# Patient Record
Sex: Female | Born: 1935 | Race: White | Hispanic: No | Smoking: Never smoker
Health system: Southern US, Community
[De-identification: ages and names within clinical notes are randomized; demographics above are authoritative.]

## PROBLEM LIST (undated history)

## (undated) DIAGNOSIS — L03116 Cellulitis of left lower limb: Secondary | ICD-10-CM

## (undated) DIAGNOSIS — E440 Moderate protein-calorie malnutrition: Secondary | ICD-10-CM

## (undated) DIAGNOSIS — R197 Diarrhea, unspecified: Secondary | ICD-10-CM

## (undated) DIAGNOSIS — C801 Malignant (primary) neoplasm, unspecified: Secondary | ICD-10-CM

## (undated) DIAGNOSIS — H3530 Unspecified macular degeneration: Secondary | ICD-10-CM

## (undated) DIAGNOSIS — I5033 Acute on chronic diastolic (congestive) heart failure: Secondary | ICD-10-CM

## (undated) DIAGNOSIS — I5030 Unspecified diastolic (congestive) heart failure: Secondary | ICD-10-CM

## (undated) DIAGNOSIS — I10 Essential (primary) hypertension: Secondary | ICD-10-CM

## (undated) DIAGNOSIS — R600 Localized edema: Secondary | ICD-10-CM

## (undated) DIAGNOSIS — D649 Anemia, unspecified: Secondary | ICD-10-CM

## (undated) DIAGNOSIS — E871 Hypo-osmolality and hyponatremia: Secondary | ICD-10-CM

## (undated) DIAGNOSIS — E785 Hyperlipidemia, unspecified: Secondary | ICD-10-CM

## (undated) DIAGNOSIS — I4821 Permanent atrial fibrillation: Secondary | ICD-10-CM

## (undated) DIAGNOSIS — R32 Unspecified urinary incontinence: Secondary | ICD-10-CM

## (undated) DIAGNOSIS — L0390 Cellulitis, unspecified: Secondary | ICD-10-CM

## (undated) DIAGNOSIS — L03115 Cellulitis of right lower limb: Secondary | ICD-10-CM

## (undated) HISTORY — PX: BREAST SURGERY: SHX581

## (undated) HISTORY — PX: EYE SURGERY: SHX253

## (undated) HISTORY — PX: MASTECTOMY: SHX3

## (undated) HISTORY — DX: Acute on chronic diastolic (congestive) heart failure: I50.33

## (undated) HISTORY — PX: SKIN BIOPSY: SHX1

## (undated) HISTORY — DX: Moderate protein-calorie malnutrition: E44.0

## (undated) HISTORY — DX: Hyperlipidemia, unspecified: E78.5

## (undated) HISTORY — DX: Unspecified macular degeneration: H35.30

## (undated) HISTORY — DX: Cellulitis of left lower limb: L03.116

## (undated) HISTORY — DX: Cellulitis of right lower limb: L03.115

## (undated) HISTORY — DX: Unspecified diastolic (congestive) heart failure: I50.30

## (undated) HISTORY — DX: Anemia, unspecified: D64.9

## (undated) HISTORY — PX: SHOULDER OPEN ROTATOR CUFF REPAIR: SHX2407

## (undated) HISTORY — DX: Diarrhea, unspecified: R19.7

---

## 2013-10-28 DIAGNOSIS — S0990XA Unspecified injury of head, initial encounter: Secondary | ICD-10-CM | POA: Insufficient documentation

## 2013-10-28 DIAGNOSIS — S61419A Laceration without foreign body of unspecified hand, initial encounter: Secondary | ICD-10-CM

## 2013-10-28 HISTORY — DX: Laceration without foreign body of unspecified hand, initial encounter: S61.419A

## 2013-10-28 HISTORY — DX: Unspecified injury of head, initial encounter: S09.90XA

## 2014-05-11 DIAGNOSIS — S62109A Fracture of unspecified carpal bone, unspecified wrist, initial encounter for closed fracture: Secondary | ICD-10-CM | POA: Insufficient documentation

## 2014-05-13 DIAGNOSIS — Z85828 Personal history of other malignant neoplasm of skin: Secondary | ICD-10-CM | POA: Insufficient documentation

## 2014-09-29 DIAGNOSIS — L03119 Cellulitis of unspecified part of limb: Secondary | ICD-10-CM | POA: Insufficient documentation

## 2014-10-08 DIAGNOSIS — H409 Unspecified glaucoma: Secondary | ICD-10-CM | POA: Insufficient documentation

## 2016-06-05 DIAGNOSIS — H353211 Exudative age-related macular degeneration, right eye, with active choroidal neovascularization: Secondary | ICD-10-CM | POA: Diagnosis not present

## 2016-06-05 DIAGNOSIS — H353122 Nonexudative age-related macular degeneration, left eye, intermediate dry stage: Secondary | ICD-10-CM | POA: Diagnosis not present

## 2016-06-13 DIAGNOSIS — M5441 Lumbago with sciatica, right side: Secondary | ICD-10-CM | POA: Diagnosis not present

## 2016-06-13 DIAGNOSIS — M9903 Segmental and somatic dysfunction of lumbar region: Secondary | ICD-10-CM | POA: Diagnosis not present

## 2016-06-13 DIAGNOSIS — M5136 Other intervertebral disc degeneration, lumbar region: Secondary | ICD-10-CM | POA: Diagnosis not present

## 2016-06-13 DIAGNOSIS — M9904 Segmental and somatic dysfunction of sacral region: Secondary | ICD-10-CM | POA: Diagnosis not present

## 2016-06-16 DIAGNOSIS — M5136 Other intervertebral disc degeneration, lumbar region: Secondary | ICD-10-CM | POA: Diagnosis not present

## 2016-06-16 DIAGNOSIS — M9904 Segmental and somatic dysfunction of sacral region: Secondary | ICD-10-CM | POA: Diagnosis not present

## 2016-06-16 DIAGNOSIS — M5441 Lumbago with sciatica, right side: Secondary | ICD-10-CM | POA: Diagnosis not present

## 2016-06-16 DIAGNOSIS — M9903 Segmental and somatic dysfunction of lumbar region: Secondary | ICD-10-CM | POA: Diagnosis not present

## 2016-06-22 DIAGNOSIS — M5441 Lumbago with sciatica, right side: Secondary | ICD-10-CM | POA: Diagnosis not present

## 2016-06-22 DIAGNOSIS — S22010A Wedge compression fracture of first thoracic vertebra, initial encounter for closed fracture: Secondary | ICD-10-CM | POA: Diagnosis not present

## 2016-06-22 DIAGNOSIS — M545 Low back pain: Secondary | ICD-10-CM | POA: Diagnosis not present

## 2016-06-30 DIAGNOSIS — M25551 Pain in right hip: Secondary | ICD-10-CM | POA: Diagnosis not present

## 2016-06-30 DIAGNOSIS — M7061 Trochanteric bursitis, right hip: Secondary | ICD-10-CM | POA: Diagnosis not present

## 2016-06-30 DIAGNOSIS — M47816 Spondylosis without myelopathy or radiculopathy, lumbar region: Secondary | ICD-10-CM | POA: Diagnosis not present

## 2016-07-04 DIAGNOSIS — F40231 Fear of injections and transfusions: Secondary | ICD-10-CM | POA: Diagnosis not present

## 2016-07-04 DIAGNOSIS — M25551 Pain in right hip: Secondary | ICD-10-CM | POA: Diagnosis not present

## 2016-07-04 DIAGNOSIS — I481 Persistent atrial fibrillation: Secondary | ICD-10-CM | POA: Diagnosis not present

## 2016-07-04 DIAGNOSIS — I10 Essential (primary) hypertension: Secondary | ICD-10-CM | POA: Diagnosis not present

## 2016-07-04 DIAGNOSIS — M1611 Unilateral primary osteoarthritis, right hip: Secondary | ICD-10-CM | POA: Diagnosis not present

## 2016-07-06 DIAGNOSIS — M4856XA Collapsed vertebra, not elsewhere classified, lumbar region, initial encounter for fracture: Secondary | ICD-10-CM | POA: Diagnosis not present

## 2016-07-12 DIAGNOSIS — R29898 Other symptoms and signs involving the musculoskeletal system: Secondary | ICD-10-CM | POA: Diagnosis not present

## 2016-07-12 DIAGNOSIS — S32030G Wedge compression fracture of third lumbar vertebra, subsequent encounter for fracture with delayed healing: Secondary | ICD-10-CM | POA: Diagnosis not present

## 2016-07-12 DIAGNOSIS — M47816 Spondylosis without myelopathy or radiculopathy, lumbar region: Secondary | ICD-10-CM | POA: Diagnosis not present

## 2016-07-12 DIAGNOSIS — M7061 Trochanteric bursitis, right hip: Secondary | ICD-10-CM | POA: Diagnosis not present

## 2016-07-12 DIAGNOSIS — M48062 Spinal stenosis, lumbar region with neurogenic claudication: Secondary | ICD-10-CM | POA: Diagnosis not present

## 2016-07-12 DIAGNOSIS — M25551 Pain in right hip: Secondary | ICD-10-CM | POA: Diagnosis not present

## 2016-07-25 DIAGNOSIS — F40231 Fear of injections and transfusions: Secondary | ICD-10-CM | POA: Diagnosis not present

## 2016-07-25 DIAGNOSIS — I481 Persistent atrial fibrillation: Secondary | ICD-10-CM | POA: Diagnosis not present

## 2016-07-25 DIAGNOSIS — I10 Essential (primary) hypertension: Secondary | ICD-10-CM | POA: Diagnosis not present

## 2016-07-25 DIAGNOSIS — M5117 Intervertebral disc disorders with radiculopathy, lumbosacral region: Secondary | ICD-10-CM | POA: Diagnosis not present

## 2016-07-25 DIAGNOSIS — M5416 Radiculopathy, lumbar region: Secondary | ICD-10-CM | POA: Diagnosis not present

## 2016-08-08 DIAGNOSIS — I481 Persistent atrial fibrillation: Secondary | ICD-10-CM | POA: Diagnosis not present

## 2016-08-08 DIAGNOSIS — M5416 Radiculopathy, lumbar region: Secondary | ICD-10-CM | POA: Diagnosis not present

## 2016-08-08 DIAGNOSIS — I10 Essential (primary) hypertension: Secondary | ICD-10-CM | POA: Diagnosis not present

## 2016-08-08 DIAGNOSIS — F40231 Fear of injections and transfusions: Secondary | ICD-10-CM | POA: Diagnosis not present

## 2016-08-17 DIAGNOSIS — S32030G Wedge compression fracture of third lumbar vertebra, subsequent encounter for fracture with delayed healing: Secondary | ICD-10-CM | POA: Diagnosis not present

## 2016-08-17 DIAGNOSIS — M5417 Radiculopathy, lumbosacral region: Secondary | ICD-10-CM | POA: Diagnosis not present

## 2016-08-17 DIAGNOSIS — M5416 Radiculopathy, lumbar region: Secondary | ICD-10-CM | POA: Diagnosis not present

## 2016-08-17 DIAGNOSIS — M25551 Pain in right hip: Secondary | ICD-10-CM | POA: Diagnosis not present

## 2016-08-17 DIAGNOSIS — M47816 Spondylosis without myelopathy or radiculopathy, lumbar region: Secondary | ICD-10-CM | POA: Diagnosis not present

## 2016-08-17 DIAGNOSIS — M48062 Spinal stenosis, lumbar region with neurogenic claudication: Secondary | ICD-10-CM | POA: Diagnosis not present

## 2016-08-17 DIAGNOSIS — M7061 Trochanteric bursitis, right hip: Secondary | ICD-10-CM | POA: Diagnosis not present

## 2016-08-17 DIAGNOSIS — R29898 Other symptoms and signs involving the musculoskeletal system: Secondary | ICD-10-CM | POA: Diagnosis not present

## 2016-08-17 DIAGNOSIS — M1611 Unilateral primary osteoarthritis, right hip: Secondary | ICD-10-CM | POA: Diagnosis not present

## 2016-09-05 DIAGNOSIS — I482 Chronic atrial fibrillation: Secondary | ICD-10-CM | POA: Diagnosis not present

## 2016-09-05 DIAGNOSIS — M816 Localized osteoporosis [Lequesne]: Secondary | ICD-10-CM | POA: Diagnosis not present

## 2016-09-05 DIAGNOSIS — R600 Localized edema: Secondary | ICD-10-CM | POA: Diagnosis not present

## 2016-09-05 DIAGNOSIS — I10 Essential (primary) hypertension: Secondary | ICD-10-CM | POA: Diagnosis not present

## 2016-09-11 DIAGNOSIS — H353122 Nonexudative age-related macular degeneration, left eye, intermediate dry stage: Secondary | ICD-10-CM | POA: Diagnosis not present

## 2016-09-11 DIAGNOSIS — H353211 Exudative age-related macular degeneration, right eye, with active choroidal neovascularization: Secondary | ICD-10-CM | POA: Diagnosis not present

## 2016-10-03 DIAGNOSIS — R350 Frequency of micturition: Secondary | ICD-10-CM | POA: Diagnosis not present

## 2016-10-03 DIAGNOSIS — R609 Edema, unspecified: Secondary | ICD-10-CM | POA: Diagnosis not present

## 2016-10-03 DIAGNOSIS — I10 Essential (primary) hypertension: Secondary | ICD-10-CM | POA: Diagnosis not present

## 2016-11-14 DIAGNOSIS — R609 Edema, unspecified: Secondary | ICD-10-CM | POA: Insufficient documentation

## 2016-11-22 DIAGNOSIS — I10 Essential (primary) hypertension: Secondary | ICD-10-CM | POA: Diagnosis not present

## 2016-11-22 DIAGNOSIS — E785 Hyperlipidemia, unspecified: Secondary | ICD-10-CM | POA: Diagnosis not present

## 2016-11-22 DIAGNOSIS — I482 Chronic atrial fibrillation: Secondary | ICD-10-CM | POA: Diagnosis not present

## 2016-11-22 DIAGNOSIS — E7800 Pure hypercholesterolemia, unspecified: Secondary | ICD-10-CM | POA: Diagnosis not present

## 2016-11-27 DIAGNOSIS — Z0101 Encounter for examination of eyes and vision with abnormal findings: Secondary | ICD-10-CM | POA: Diagnosis not present

## 2016-11-27 DIAGNOSIS — H353212 Exudative age-related macular degeneration, right eye, with inactive choroidal neovascularization: Secondary | ICD-10-CM | POA: Diagnosis not present

## 2016-11-27 DIAGNOSIS — H01024 Squamous blepharitis left upper eyelid: Secondary | ICD-10-CM | POA: Diagnosis not present

## 2016-11-27 DIAGNOSIS — H401132 Primary open-angle glaucoma, bilateral, moderate stage: Secondary | ICD-10-CM | POA: Diagnosis not present

## 2016-11-27 DIAGNOSIS — H35371 Puckering of macula, right eye: Secondary | ICD-10-CM | POA: Diagnosis not present

## 2016-11-27 DIAGNOSIS — H01021 Squamous blepharitis right upper eyelid: Secondary | ICD-10-CM | POA: Diagnosis not present

## 2016-12-22 DIAGNOSIS — I482 Chronic atrial fibrillation: Secondary | ICD-10-CM | POA: Diagnosis not present

## 2016-12-22 DIAGNOSIS — Z0000 Encounter for general adult medical examination without abnormal findings: Secondary | ICD-10-CM | POA: Diagnosis not present

## 2016-12-22 DIAGNOSIS — I10 Essential (primary) hypertension: Secondary | ICD-10-CM | POA: Diagnosis not present

## 2016-12-22 DIAGNOSIS — R609 Edema, unspecified: Secondary | ICD-10-CM | POA: Diagnosis not present

## 2016-12-22 DIAGNOSIS — E785 Hyperlipidemia, unspecified: Secondary | ICD-10-CM | POA: Diagnosis not present

## 2017-01-08 DIAGNOSIS — H353122 Nonexudative age-related macular degeneration, left eye, intermediate dry stage: Secondary | ICD-10-CM | POA: Diagnosis not present

## 2017-01-08 DIAGNOSIS — H353211 Exudative age-related macular degeneration, right eye, with active choroidal neovascularization: Secondary | ICD-10-CM | POA: Diagnosis not present

## 2017-01-12 DIAGNOSIS — Z23 Encounter for immunization: Secondary | ICD-10-CM | POA: Diagnosis not present

## 2017-02-01 DIAGNOSIS — Z23 Encounter for immunization: Secondary | ICD-10-CM | POA: Diagnosis not present

## 2017-04-16 ENCOUNTER — Emergency Department (HOSPITAL_COMMUNITY): Payer: Medicare Other

## 2017-04-16 ENCOUNTER — Emergency Department (HOSPITAL_COMMUNITY)
Admission: EM | Admit: 2017-04-16 | Discharge: 2017-04-16 | Disposition: A | Discharge status: Home / Self Care | Payer: Medicare Other | Attending: Emergency Medicine | Admitting: Emergency Medicine

## 2017-04-16 ENCOUNTER — Other Ambulatory Visit: Payer: Self-pay

## 2017-04-16 ENCOUNTER — Encounter (HOSPITAL_COMMUNITY): Payer: Self-pay | Admitting: Nurse Practitioner

## 2017-04-16 DIAGNOSIS — E871 Hypo-osmolality and hyponatremia: Secondary | ICD-10-CM | POA: Insufficient documentation

## 2017-04-16 DIAGNOSIS — Z79899 Other long term (current) drug therapy: Secondary | ICD-10-CM | POA: Diagnosis not present

## 2017-04-16 DIAGNOSIS — I10 Essential (primary) hypertension: Secondary | ICD-10-CM | POA: Diagnosis not present

## 2017-04-16 DIAGNOSIS — Y929 Unspecified place or not applicable: Secondary | ICD-10-CM | POA: Insufficient documentation

## 2017-04-16 DIAGNOSIS — Z853 Personal history of malignant neoplasm of breast: Secondary | ICD-10-CM | POA: Insufficient documentation

## 2017-04-16 DIAGNOSIS — S29002A Unspecified injury of muscle and tendon of back wall of thorax, initial encounter: Secondary | ICD-10-CM | POA: Diagnosis not present

## 2017-04-16 DIAGNOSIS — X509XXA Other and unspecified overexertion or strenuous movements or postures, initial encounter: Secondary | ICD-10-CM | POA: Insufficient documentation

## 2017-04-16 DIAGNOSIS — Z7901 Long term (current) use of anticoagulants: Secondary | ICD-10-CM | POA: Diagnosis not present

## 2017-04-16 DIAGNOSIS — G8911 Acute pain due to trauma: Secondary | ICD-10-CM | POA: Diagnosis not present

## 2017-04-16 DIAGNOSIS — Y999 Unspecified external cause status: Secondary | ICD-10-CM | POA: Diagnosis not present

## 2017-04-16 DIAGNOSIS — Y9301 Activity, walking, marching and hiking: Secondary | ICD-10-CM | POA: Diagnosis not present

## 2017-04-16 DIAGNOSIS — S32030A Wedge compression fracture of third lumbar vertebra, initial encounter for closed fracture: Secondary | ICD-10-CM | POA: Diagnosis not present

## 2017-04-16 DIAGNOSIS — M545 Low back pain: Secondary | ICD-10-CM | POA: Diagnosis not present

## 2017-04-16 DIAGNOSIS — M549 Dorsalgia, unspecified: Secondary | ICD-10-CM | POA: Diagnosis not present

## 2017-04-16 DIAGNOSIS — S3992XA Unspecified injury of lower back, initial encounter: Secondary | ICD-10-CM | POA: Diagnosis present

## 2017-04-16 DIAGNOSIS — R260 Ataxic gait: Secondary | ICD-10-CM | POA: Diagnosis not present

## 2017-04-16 HISTORY — DX: Malignant (primary) neoplasm, unspecified: C80.1

## 2017-04-16 HISTORY — DX: Cellulitis, unspecified: L03.90

## 2017-04-16 HISTORY — DX: Hypo-osmolality and hyponatremia: E87.1

## 2017-04-16 HISTORY — DX: Localized edema: R60.0

## 2017-04-16 HISTORY — DX: Unspecified urinary incontinence: R32

## 2017-04-16 HISTORY — DX: Essential (primary) hypertension: I10

## 2017-04-16 LAB — CBC
HEMATOCRIT: 34.4 % — AB (ref 36.0–46.0)
HEMOGLOBIN: 11.9 g/dL — AB (ref 12.0–15.0)
MCH: 27.9 pg (ref 26.0–34.0)
MCHC: 34.6 g/dL (ref 30.0–36.0)
MCV: 80.6 fL (ref 78.0–100.0)
Platelets: 405 10*3/uL — ABNORMAL HIGH (ref 150–400)
RBC: 4.27 MIL/uL (ref 3.87–5.11)
RDW: 14.2 % (ref 11.5–15.5)
WBC: 16.3 10*3/uL — AB (ref 4.0–10.5)

## 2017-04-16 LAB — BASIC METABOLIC PANEL
Anion gap: 10 (ref 5–15)
BUN: 13 mg/dL (ref 6–20)
CALCIUM: 9.3 mg/dL (ref 8.9–10.3)
CHLORIDE: 88 mmol/L — AB (ref 101–111)
CO2: 26 mmol/L (ref 22–32)
Creatinine, Ser: 0.61 mg/dL (ref 0.44–1.00)
GFR calc non Af Amer: >60 mL/min (ref >60)
Glucose, Bld: 118 mg/dL — ABNORMAL HIGH (ref 65–99)
Potassium: 3.5 mmol/L (ref 3.5–5.1)
Sodium: 124 mmol/L — ABNORMAL LOW (ref 135–145)

## 2017-04-16 LAB — URINALYSIS, ROUTINE W REFLEX MICROSCOPIC
Bilirubin Urine: NEGATIVE
Glucose, UA: NEGATIVE mg/dL
Hgb urine dipstick: NEGATIVE
KETONES UR: NEGATIVE mg/dL
LEUKOCYTES UA: NEGATIVE
NITRITE: NEGATIVE
PH: 7 (ref 5.0–8.0)
Protein, ur: NEGATIVE mg/dL
Specific Gravity, Urine: 1.01 (ref 1.005–1.030)

## 2017-04-16 MED ORDER — HYDROCODONE-ACETAMINOPHEN 5-325 MG PO TABS: 1.0000 | ORAL_TABLET | Freq: Four times a day (QID) | ORAL | 0 refills | Status: DC | PRN

## 2017-04-16 MED ORDER — MORPHINE SULFATE (PF) 4 MG/ML IV SOLN
4.0000 mg | Freq: Once | INTRAVENOUS | Status: AC
  Administered 2017-04-16: 4 mg via INTRAVENOUS
  Filled 2017-04-16: qty 1

## 2017-04-16 MED ORDER — DOCUSATE SODIUM 100 MG PO CAPS: 100.0000 mg | ORAL_CAPSULE | Freq: Every day | ORAL | 0 refills | Status: DC

## 2017-04-16 NOTE — ED Notes (Signed)
[employer] called they are going to deliver the brace within 2 hours

## 2017-04-16 NOTE — ED Provider Notes (Signed)
[facility] DEPT Provider Note   CSN: [id number] Arrival date & time: 04/16/17  1319     History   Chief Complaint Back pain  HPI Heather Larson is a 83 y.o. female.  HPI Pt started having back pain on Friday.   The pain is across her back around her waist line.  Pt was walking up steps when she felt a twinge of pain.  It has been constant since then.  Saturday she did not walk around too much.  The pain was increasing yesterday as well but today it was even more severe so she came to the ED.  No cp or dyspnea.  No abdominal pain.  No dysuria.    She does have history of sciatica and was treated with epidurals in the past.  It helped significantly but this does not feel the same. Past Medical History:  Diagnosis Date  . A-fib ([place])   . Bilateral lower extremity edema   . Cancer [facility])    breast cancer at age 69 and skin  . Cellulitis    lower abdomen  . Hypertension   . Incontinence   . Low sodium levels     There are no active problems to display for this patient.   Past Surgical History:  Procedure Laterality Date  . BREAST SURGERY     right mastectomy  . EYE SURGERY     x3  . MASTECTOMY     right  . SHOULDER OPEN ROTATOR CUFF REPAIR     left  . SKIN BIOPSY     skin cancer    OB History    Gravida Para Term Preterm AB Living             2   SAB TAB Ectopic Multiple Live Births                   Home Medications    Prior to Admission medications   Medication Sig Start Date End Date Taking? Authorizing Provider  atorvastatin (LIPITOR) 10 MG tablet Take 10 mg by mouth daily.   Yes [provider]  brimonidine (ALPHAGAN P) 0.1 % SOLN Place 1 drop into both eyes 2 (two) times daily.   Yes [provider]  diltiazem (TIAZAC) 360 MG 24 hr capsule Take 360 mg by mouth daily.   Yes [provider]  hydrALAZINE (APRESOLINE) 25 MG tablet Take 25 mg by mouth 3 (three) times daily.    Yes [provider]  meloxicam (MOBIC) 7.5 MG tablet Take 7.5 mg by mouth daily.   Yes [provider]  Multiple Vitamins-Minerals (MULTIVITAMIN ADULT) TABS Take 1 tablet by mouth daily.   Yes [provider]  Multiple Vitamins-Minerals (PRESERVISION AREDS 2) CAPS Take 1 tablet by mouth daily.   Yes [provider]  Rivaroxaban (XARELTO) 15 MG TABS tablet Take 15 mg by mouth daily.   Yes [provider]  travoprost, benzalkonium, (TRAVATAN) 0.004 % ophthalmic solution Place 1 drop into both eyes at bedtime.   Yes [provider]  valsartan-hydrochlorothiazide (DIOVAN-HCT) 160-25 MG tablet Take 1 tablet by mouth daily.   Yes [provider]  vitamin C (ASCORBIC ACID) 500 MG tablet Take 500 mg by mouth daily.   Yes [provider]  brimonidine (ALPHAGAN) 0.15 % ophthalmic solution Place 1 drop into both eyes 2 (two) times daily.    [provider]  docusate sodium (COLACE) 100 MG capsule Take 1 capsule (100 mg  total) by mouth daily. 04/16/17   Dorie Rank, MD  HYDROcodone-acetaminophen (NORCO/VICODIN) 5-325 MG tablet Take 1 tablet by mouth every 6 (six) hours as needed. 04/16/17   Dorie Rank, MD  valsartan (DIOVAN) 160 MG tablet Take 160 mg by mouth daily.    [provider]    Family History History reviewed. No pertinent family history.  Social History Social History   Tobacco Use  . Smoking status: Unknown If Ever Smoked  Substance Use Topics  . Alcohol use: Yes    Alcohol/week: 1.2 oz    Types: 2 Shots of liquor per week    Comment: 2 drinks daily  . Drug use: No     Allergies   Patient has no known allergies.   Review of Systems Review of Systems  Constitutional: Negative for fever.  Respiratory: Negative for shortness of breath and stridor.   Cardiovascular: Positive for leg swelling (chronic). Negative for chest pain.  Genitourinary: Negative for dysuria.  All other systems reviewed and are  negative.    Physical Exam Updated Vital Signs BP (!) 145/84 (BP Location: Left Arm)   Pulse 93   Temp (!) 97.5 F (36.4 C) (Oral)   Resp 16   Ht 1.575 m (5\' 2" )   Wt 63.5 kg (140 lb)   SpO2 98%   BMI 25.61 kg/m   Physical Exam  Constitutional: No distress.  elderly  HENT:  Head: Normocephalic and atraumatic.  Right Ear: External ear normal.  Left Ear: External ear normal.  Eyes: Conjunctivae are normal. Right eye exhibits no discharge. Left eye exhibits no discharge. No scleral icterus.  Neck: Neck supple. No tracheal deviation present.  Cardiovascular: Normal rate, regular rhythm and intact distal pulses.  Pulmonary/Chest: Effort normal and breath sounds normal. No stridor. No respiratory distress. She has no wheezes. She has no rales.  Abdominal: Soft. Bowel sounds are normal. She exhibits no distension. There is no tenderness. There is no rebound and no guarding.  Musculoskeletal: She exhibits edema (firm bilateral lower extrem, erythem bilateral lower extrem, chronic skin changes associated with edema).       Lumbar back: She exhibits tenderness and bony tenderness. She exhibits no swelling, no edema and no deformity.  Neurological: She is alert. She has normal strength. No cranial nerve deficit (no facial droop, extraocular movements intact, no slurred speech) or sensory deficit. She exhibits normal muscle tone. She displays no seizure activity. Coordination normal.  Skin: Skin is warm and dry. No rash noted. She is not diaphoretic.  Psychiatric: She has a normal mood and affect.  Nursing note and vitals reviewed.    ED Treatments / Results  Labs (all labs ordered are listed, but only abnormal results are displayed) Labs Reviewed  CBC - Abnormal; Notable for the following components:      Result Value   WBC 16.3 (*)    Hemoglobin 11.9 (*)    HCT 34.4 (*)    Platelets 405 (*)    All other components within normal limits  BASIC METABOLIC PANEL - Abnormal; Notable  for the following components:   Sodium 124 (*)    Chloride 88 (*)    Glucose, Bld 118 (*)    All other components within normal limits  URINALYSIS, ROUTINE W REFLEX MICROSCOPIC    Radiology Dg Lumbar Spine Complete  Result Date: 04/16/2017 CLINICAL DATA:  Low back pain for 2 days. EXAM: LUMBAR SPINE - COMPLETE 4+ VIEW COMPARISON:  None. FINDINGS: L3 compression fracture deformity, approximately 40-50%  compression anteriorly and centrally, without vertebral body retropulsion. This compression fracture is of uncertain age. Additional compression fracture deformities of the T11 and T12 vertebral bodies, also moderate in degree, also of uncertain age but more likely chronic. Associated kyphosis and mild retropulsion at the T12 level. Degenerative changes throughout the scoliotic thoracolumbar spine, at least moderate in degree with associated disc space narrowings and osseous spurring. Approximately 9 mm anterolisthesis of L4 on L5, likely related to the underlying degenerative change. Atherosclerotic changes of the infrarenal abdominal aorta. Visualized paravertebral soft tissues are otherwise unremarkable. Upper sacrum appears intact and normally aligned. IMPRESSION: 1. Compression fracture deformities of the T11, T12 and L3 vertebral bodies. The compression fractures of the T11 and T12 vertebral bodies appear chronic. The L3 compression fracture deformity is of uncertain age and may be acute. 2. Additional chronic/degenerative changes, as detailed above. 3. Aortic atherosclerosis. Electronically Signed   By: Franki Cabot M.D.   On: 04/16/2017 17:06    Procedures Procedures (including critical care time)  Medications Ordered in ED Medications  morphine 4 MG/ML injection 4 mg (4 mg Intravenous Given 04/16/17 1637)     Initial Impression / Assessment and Plan / ED Course  I have reviewed the triage vital signs and the nursing notes.  Pertinent labs & imaging results that were available during  my care of the patient were reviewed by me and considered in my medical decision making (see chart for details).  Clinical Course as of Apr 16 2022  Mon Apr 16, 2017  1757 Pt would prefer to go home.  She does not want to be admitted.   Will order a TLSO brace and have her try to walk.  Pt states her sodium level is chronically low.  This is not new for her.  [JK]    Clinical Course User Index [JK] Dorie Rank, MD    Patient presented to the emergency room for evaluation of lower back pain.  X-rays suggest an acute L3 compression fracture.  This correlates with the patient's area of pain.  Patient was given a TLSO brace.  I discussed bring her into the hospital for pain management and physical therapy.  Patient was feeling better after treatment and did not want to be admitted.  She was able to walk around the emergency room.  Her daughter is here with her and will be taking her home.  They are comfortable with outpatient management.  I gave her a referral to a neurosurgeon.  Patient is visiting in this area and will eventually return home.  If she does not see when here she will follow-up with 1 in her hometown  Final Clinical Impressions(s) / ED Diagnoses   Final diagnoses:  Hyponatremia  Closed compression fracture of third lumbar vertebra, initial encounter [facility])    ED Discharge Orders        Ordered    HYDROcodone-acetaminophen (NORCO/VICODIN) 5-325 MG tablet  Every 6 hours PRN     04/16/17 2023    docusate sodium (COLACE) 100 MG capsule  Daily     04/16/17 2023       Dorie Rank, MD 04/16/17 2025

## 2017-04-16 NOTE — ED Triage Notes (Signed)
Patinet came via [facility] from home. Patient walked up three stairs and a turned wrong and is now having lower back pain. Patient has had this for 2 days. Mostly in the lower back constant.

## 2017-04-20 DIAGNOSIS — I10 Essential (primary) hypertension: Secondary | ICD-10-CM | POA: Diagnosis not present

## 2017-04-20 DIAGNOSIS — Z6827 Body mass index (BMI) 27.0-27.9, adult: Secondary | ICD-10-CM | POA: Diagnosis not present

## 2017-04-20 DIAGNOSIS — S32030D Wedge compression fracture of third lumbar vertebra, subsequent encounter for fracture with routine healing: Secondary | ICD-10-CM | POA: Diagnosis not present

## 2017-04-25 ENCOUNTER — Ambulatory Visit
Admission: RE | Admit: 2017-04-25 | Discharge: 2017-04-25 | Disposition: A | Discharge status: Home / Self Care | Payer: Medicare Other | Source: Ambulatory Visit | Attending: Physician Assistant | Admitting: Physician Assistant

## 2017-04-25 ENCOUNTER — Other Ambulatory Visit: Payer: Self-pay | Admitting: Physician Assistant

## 2017-04-25 DIAGNOSIS — M48061 Spinal stenosis, lumbar region without neurogenic claudication: Secondary | ICD-10-CM | POA: Diagnosis not present

## 2017-04-25 DIAGNOSIS — S32030D Wedge compression fracture of third lumbar vertebra, subsequent encounter for fracture with routine healing: Secondary | ICD-10-CM

## 2017-04-27 DIAGNOSIS — M4317 Spondylolisthesis, lumbosacral region: Secondary | ICD-10-CM | POA: Diagnosis not present

## 2017-04-27 DIAGNOSIS — S22080D Wedge compression fracture of T11-T12 vertebra, subsequent encounter for fracture with routine healing: Secondary | ICD-10-CM | POA: Diagnosis not present

## 2017-04-27 DIAGNOSIS — S32030D Wedge compression fracture of third lumbar vertebra, subsequent encounter for fracture with routine healing: Secondary | ICD-10-CM | POA: Diagnosis not present

## 2017-04-27 DIAGNOSIS — M4727 Other spondylosis with radiculopathy, lumbosacral region: Secondary | ICD-10-CM | POA: Diagnosis not present

## 2017-05-22 DIAGNOSIS — Z6825 Body mass index (BMI) 25.0-25.9, adult: Secondary | ICD-10-CM | POA: Diagnosis not present

## 2017-05-22 DIAGNOSIS — M48062 Spinal stenosis, lumbar region with neurogenic claudication: Secondary | ICD-10-CM | POA: Diagnosis not present

## 2017-05-22 DIAGNOSIS — I10 Essential (primary) hypertension: Secondary | ICD-10-CM | POA: Diagnosis not present

## 2017-05-30 DIAGNOSIS — S22080D Wedge compression fracture of T11-T12 vertebra, subsequent encounter for fracture with routine healing: Secondary | ICD-10-CM | POA: Diagnosis not present

## 2017-05-30 DIAGNOSIS — I10 Essential (primary) hypertension: Secondary | ICD-10-CM | POA: Diagnosis not present

## 2017-05-30 DIAGNOSIS — M48062 Spinal stenosis, lumbar region with neurogenic claudication: Secondary | ICD-10-CM | POA: Diagnosis not present

## 2017-05-30 DIAGNOSIS — M4727 Other spondylosis with radiculopathy, lumbosacral region: Secondary | ICD-10-CM | POA: Diagnosis not present

## 2017-05-30 DIAGNOSIS — M4317 Spondylolisthesis, lumbosacral region: Secondary | ICD-10-CM | POA: Diagnosis not present

## 2017-06-13 DIAGNOSIS — M48062 Spinal stenosis, lumbar region with neurogenic claudication: Secondary | ICD-10-CM | POA: Diagnosis not present

## 2017-06-15 ENCOUNTER — Encounter: Payer: Self-pay | Admitting: Primary Care

## 2017-06-15 ENCOUNTER — Ambulatory Visit (INDEPENDENT_AMBULATORY_CARE_PROVIDER_SITE_OTHER): Payer: Medicare Other | Admitting: Primary Care

## 2017-06-15 ENCOUNTER — Other Ambulatory Visit: Payer: Self-pay | Admitting: *Deleted

## 2017-06-15 VITALS — BP 140/66 | HR 82 | Temp 97.7°F | Ht 64.0 in | Wt 127.8 lb

## 2017-06-15 DIAGNOSIS — R32 Unspecified urinary incontinence: Secondary | ICD-10-CM | POA: Diagnosis not present

## 2017-06-15 DIAGNOSIS — I10 Essential (primary) hypertension: Secondary | ICD-10-CM | POA: Diagnosis not present

## 2017-06-15 DIAGNOSIS — M8000XD Age-related osteoporosis with current pathological fracture, unspecified site, subsequent encounter for fracture with routine healing: Secondary | ICD-10-CM

## 2017-06-15 DIAGNOSIS — M810 Age-related osteoporosis without current pathological fracture: Secondary | ICD-10-CM | POA: Insufficient documentation

## 2017-06-15 DIAGNOSIS — H3530 Unspecified macular degeneration: Secondary | ICD-10-CM | POA: Insufficient documentation

## 2017-06-15 DIAGNOSIS — I482 Chronic atrial fibrillation, unspecified: Secondary | ICD-10-CM | POA: Insufficient documentation

## 2017-06-15 DIAGNOSIS — E785 Hyperlipidemia, unspecified: Secondary | ICD-10-CM | POA: Diagnosis not present

## 2017-06-15 NOTE — Progress Notes (Signed)
Subjective:    Patient ID: Heather Larson, female    DOB: [birth year], 84 y.o.   MRN: [id number]  HPI  Heather Larson is a 84 year old female who presents today to establish care and discuss the problems mentioned below. Will obtain old records. She moved from [place] in November 2018. Her last phyiscal was in Fall 2019. She did not bring her medications with her today. Her daughter with with her today.  1) Hyperlipidemia: Currently managed on atorvastatin 10 mg. Her last cholesterol check was in the fall during her physical in 2018.  She thinks this was normal.  Based off of information from care everywhere it looks like she was told to stop atorvastatin 10 mg in July 2018 and then again in  August 2018.  2) Essential Hypertension: Currently managed on diltiazem 360 mg, hydralazine 25 mg TID, valsartan-HCTZ 160-25 mg.  She is not entirely sure she is taking hydralazine 3 times daily, may be taking less frequently but does not have her medication with her today.  She denies chest pain, dizziness, headaches.  Based off of information from care everywhere she was told to stop hydralazine in August 2018.  BP Readings from Last 3 Encounters:  06/15/17 140/66  04/16/17 (!) 145/84     3) Atrial Fibrillation: Diagnosed in 2014. Currently managed on diltiazem 360 mg, Xarelto 15 mg. She was  following with cardiology with Dr. Rosiland Oz in [place]. She's like to see a cardiologist locally.   4) Glaucoma/Macular Degeneration: Currently managed on Travatan, Alphagan solutions. She was seeing a retinal specialist in [place], needs to establish locally.    5) Compression Fracture/Osteorosis: Found on MRI in December 2018 with compression at T12, L1, and L3. Also with osteoporosis. She is currently following Dr. Cyndy Freeze who is undergoing epidurals. She is due to return in two weeks for xrays and follow up. She was once managed on Boniva for years which didn't seem to make a difference in her  osteoporosis. Her last dose of medication was about 10 years ago. She will not undergo kypoplasty at this time. She is taking Meloxicam 7.5 mg once daily for chronic intermittent sciatica.  6) Urinary Incontinence: She thinks she's taking a tablet for incontinence twice daily. She doesn't have her medications with her today.   Review of Systems  Constitutional: Negative for unexpected weight change.  Respiratory: Negative for shortness of breath.   Cardiovascular: Negative for chest pain.  Gastrointestinal: Negative for abdominal pain.  Genitourinary:       Urinary incontinence   Musculoskeletal: Positive for back pain.  Skin: Negative for color change.  Neurological: Negative for dizziness and headaches.  Hematological: Negative for adenopathy.  Psychiatric/Behavioral: The patient is not nervous/anxious.        Past Medical History:  Diagnosis Date  . A-fib ([place])   . Bilateral lower extremity edema   . Cancer [facility])    breast cancer at age 27 and skin  . Cellulitis    lower abdomen  . Hypertension   . Incontinence   . Low sodium levels      Social History   Socioeconomic History  . Marital status: Divorced    Spouse name: Not on file  . Number of children: Not on file  . Years of education: Not on file  . Highest education level: Not on file  Social Needs  . Financial resource strain: Not on file  . Food insecurity - worry: Not on file  . Food insecurity -  inability: Not on file  . Transportation needs - medical: Not on file  . Transportation needs - non-medical: Not on file  Occupational History  . Not on file  Tobacco Use  . Smoking status: Unknown If Ever Smoked  . Smokeless tobacco: Never Used  Substance and Sexual Activity  . Alcohol use: Yes    Alcohol/week: 1.2 oz    Types: 2 Shots of liquor per week    Comment: 2 drinks daily  . Drug use: No  . Sexual activity: Not on file  Other Topics Concern  . Not on file  Social History Narrative  . Not on  file    Past Surgical History:  Procedure Laterality Date  . BREAST SURGERY     right mastectomy  . EYE SURGERY     x3  . MASTECTOMY     right  . SHOULDER OPEN ROTATOR CUFF REPAIR     left  . SKIN BIOPSY     skin cancer    No family history on file.  No Known Allergies  Current Outpatient Medications on File Prior to Visit  Medication Sig Dispense Refill  . atorvastatin (LIPITOR) 10 MG tablet Take 10 mg by mouth daily.    . brimonidine (ALPHAGAN P) 0.1 % SOLN Place 1 drop into both eyes 2 (two) times daily.    Marland Kitchen diltiazem (TIAZAC) 360 MG 24 hr capsule Take 360 mg by mouth daily.    Marland Kitchen docusate sodium (COLACE) 100 MG capsule Take 1 capsule (100 mg total) by mouth daily. 10 capsule 0  . hydrALAZINE (APRESOLINE) 25 MG tablet Take 25 mg by mouth 3 (three) times daily.     . meloxicam (MOBIC) 7.5 MG tablet Take 7.5 mg by mouth daily.    . Multiple Vitamins-Minerals (MULTIVITAMIN ADULT) TABS Take 1 tablet by mouth daily.    . Rivaroxaban (XARELTO) 15 MG TABS tablet Take 15 mg by mouth daily.    . travoprost, benzalkonium, (TRAVATAN) 0.004 % ophthalmic solution Place 1 drop into both eyes at bedtime.    . valsartan-hydrochlorothiazide (DIOVAN-HCT) 160-25 MG tablet Take 1 tablet by mouth daily.    . vitamin C (ASCORBIC ACID) 500 MG tablet Take 500 mg by mouth daily.     No current facility-administered medications on file prior to visit.     BP 140/66   Pulse 82   Temp 97.7 F (36.5 C) (Oral)   Ht 5\' 4"  (1.626 m)   Wt 127 lb 12.8 oz (58 kg)   SpO2 96%   BMI 21.94 kg/m    Objective:   Physical Exam  Constitutional: She is oriented to person, place, and time. She appears well-nourished.  Eyes: Right eye exhibits no hordeolum. Left eye exhibits no hordeolum. Right conjunctiva is not injected. Left conjunctiva is not injected.  Neck: Neck supple.  Cardiovascular: Normal rate. An irregularly irregular rhythm present.  Pulmonary/Chest: Effort normal and breath sounds normal.   Musculoskeletal:  kyphosis of thoracic spine noted  Neurological: She is alert and oriented to person, place, and time.  Skin: Skin is warm and dry.  Psychiatric: She has a normal mood and affect.          Assessment & Plan:  Most of today's visit was spent on medication reconciliation.

## 2017-06-15 NOTE — Assessment & Plan Note (Signed)
Currently not on bisphosphonate or any other treatment for osteoporosis.  Following with ortho who is managing recent fractures. Recommended to discuss Prolia at next visit.

## 2017-06-15 NOTE — Patient Instructions (Addendum)
Please bring all of your medications to our front office or call with a complete list. We must know all of your medications.  You will be contacted regarding your referral to cardiology and ophthalmology.  Please let us know if you have not been contacted within one week.   Please discuss treatment for osteoporosis with Dr. Cyndy Freeze, mention Prolia. We can do this through our office.  Please schedule a physical a Medicare Wellness Visit and follow up visit with [place] in October 2019.   It was a pleasure to meet you today! Please don't hesitate to call or message me with any questions. Welcome to [facility]!

## 2017-06-15 NOTE — Assessment & Plan Note (Signed)
Overall stable, continue diltiazem, valsartan-HCTZ.  Unsure she is still taking hydralazine, pharmacy does have this on her med list.  Her daughter will bring in her bottles later so we can review.  Her daughter does not think she is taking anything 3 times daily.

## 2017-06-15 NOTE — Assessment & Plan Note (Signed)
Rate stable, rhythm irregular.  Continue Xarelto and diltiazem.  Referral placed to cardiology per her wishes.  Recent cardiology note from August 2018 reviewed through care everywhere.

## 2017-06-15 NOTE — Assessment & Plan Note (Signed)
No evidence of this after reviewing records.  Does not seem that she is taking anything for incontinence.  Pharmacy has no record of incontinence medications.  Will await her daughter to bring her medications from home.

## 2017-06-15 NOTE — Assessment & Plan Note (Signed)
Referral placed to ophthalmology for further evaluation and treatment.

## 2017-06-15 NOTE — Assessment & Plan Note (Signed)
No recent lipid panel on file, reviewed records through care everywhere.  It appears that she was told to stop atorvastatin and July and also August 2018 as she had good lipid control and also given age.  She is a poor historian with her medications and does not have them with her today.  Called her pharmacy did notice that she is taking atorvastatin.  Her daughter will bring all of her meds for [place] to review.  Will repeat lipid panel next visit.

## 2017-06-19 ENCOUNTER — Other Ambulatory Visit: Payer: Self-pay | Admitting: Primary Care

## 2017-06-19 ENCOUNTER — Encounter: Payer: Self-pay | Admitting: Primary Care

## 2017-06-19 DIAGNOSIS — E785 Hyperlipidemia, unspecified: Secondary | ICD-10-CM

## 2017-06-25 ENCOUNTER — Telehealth: Payer: Self-pay | Admitting: Primary Care

## 2017-06-25 DIAGNOSIS — Z1283 Encounter for screening for malignant neoplasm of skin: Secondary | ICD-10-CM

## 2017-06-25 NOTE — Telephone Encounter (Signed)
Noted, referral placed.  

## 2017-06-25 NOTE — Telephone Encounter (Signed)
Copied from [place] [phone]. Topic: Referral - Request >> Jun 25, 2017 12:09 PM Darl Householder, RMA wrote: Reason for CRM: patient is requesting a referral for Dermatology due to area on face on left cheek just below ear lobe

## 2017-07-03 DIAGNOSIS — S22080D Wedge compression fracture of T11-T12 vertebra, subsequent encounter for fracture with routine healing: Secondary | ICD-10-CM | POA: Diagnosis not present

## 2017-07-04 ENCOUNTER — Other Ambulatory Visit: Payer: Self-pay | Admitting: Neurosurgery

## 2017-07-04 DIAGNOSIS — S22080D Wedge compression fracture of T11-T12 vertebra, subsequent encounter for fracture with routine healing: Secondary | ICD-10-CM

## 2017-07-13 ENCOUNTER — Ambulatory Visit
Admission: RE | Admit: 2017-07-13 | Discharge: 2017-07-13 | Disposition: A | Discharge status: Home / Self Care | Payer: Medicare Other | Source: Ambulatory Visit | Attending: Neurosurgery | Admitting: Neurosurgery

## 2017-07-13 DIAGNOSIS — M4804 Spinal stenosis, thoracic region: Secondary | ICD-10-CM | POA: Diagnosis not present

## 2017-07-13 DIAGNOSIS — S22080D Wedge compression fracture of T11-T12 vertebra, subsequent encounter for fracture with routine healing: Secondary | ICD-10-CM

## 2017-07-16 ENCOUNTER — Encounter (INDEPENDENT_AMBULATORY_CARE_PROVIDER_SITE_OTHER): Payer: Self-pay | Admitting: Ophthalmology

## 2017-07-19 DIAGNOSIS — S22080D Wedge compression fracture of T11-T12 vertebra, subsequent encounter for fracture with routine healing: Secondary | ICD-10-CM | POA: Diagnosis not present

## 2017-07-23 DIAGNOSIS — D229 Melanocytic nevi, unspecified: Secondary | ICD-10-CM | POA: Diagnosis not present

## 2017-07-23 DIAGNOSIS — D0439 Carcinoma in situ of skin of other parts of face: Secondary | ICD-10-CM | POA: Diagnosis not present

## 2017-07-23 DIAGNOSIS — L821 Other seborrheic keratosis: Secondary | ICD-10-CM | POA: Diagnosis not present

## 2017-07-23 DIAGNOSIS — D040 Carcinoma in situ of skin of lip: Secondary | ICD-10-CM | POA: Diagnosis not present

## 2017-07-23 DIAGNOSIS — D225 Melanocytic nevi of trunk: Secondary | ICD-10-CM | POA: Diagnosis not present

## 2017-07-23 DIAGNOSIS — C4492 Squamous cell carcinoma of skin, unspecified: Secondary | ICD-10-CM

## 2017-07-23 DIAGNOSIS — D485 Neoplasm of uncertain behavior of skin: Secondary | ICD-10-CM | POA: Diagnosis not present

## 2017-07-23 DIAGNOSIS — C4491 Basal cell carcinoma of skin, unspecified: Secondary | ICD-10-CM

## 2017-07-23 DIAGNOSIS — D099 Carcinoma in situ, unspecified: Secondary | ICD-10-CM

## 2017-07-23 DIAGNOSIS — C4441 Basal cell carcinoma of skin of scalp and neck: Secondary | ICD-10-CM | POA: Diagnosis not present

## 2017-07-23 HISTORY — DX: Squamous cell carcinoma of skin, unspecified: C44.92

## 2017-07-23 HISTORY — DX: Carcinoma in situ, unspecified: D09.9

## 2017-07-23 HISTORY — DX: Basal cell carcinoma of skin, unspecified: C44.91

## 2017-07-24 ENCOUNTER — Other Ambulatory Visit: Payer: Self-pay | Admitting: Neurosurgery

## 2017-07-24 ENCOUNTER — Encounter: Payer: Self-pay | Admitting: Primary Care

## 2017-07-24 DIAGNOSIS — S22080D Wedge compression fracture of T11-T12 vertebra, subsequent encounter for fracture with routine healing: Secondary | ICD-10-CM

## 2017-07-30 ENCOUNTER — Ambulatory Visit: Payer: Self-pay

## 2017-07-30 DIAGNOSIS — M546 Pain in thoracic spine: Secondary | ICD-10-CM | POA: Diagnosis not present

## 2017-07-30 DIAGNOSIS — R2689 Other abnormalities of gait and mobility: Secondary | ICD-10-CM | POA: Diagnosis not present

## 2017-07-30 DIAGNOSIS — R278 Other lack of coordination: Secondary | ICD-10-CM | POA: Diagnosis not present

## 2017-07-30 DIAGNOSIS — M6281 Muscle weakness (generalized): Secondary | ICD-10-CM | POA: Diagnosis not present

## 2017-07-30 NOTE — Telephone Encounter (Signed)
Pt.'s daughter concerned because her mother's PT told her they were concerned with pt.'s redness to her right lower leg. Daughter states pt. Has had swelling "for years, but the redness is new. "  Appointment made for tomorrow.  Reason for Disposition . [1] MODERATE leg swelling (e.g., swelling extends up to knees) AND [2] new onset or worsening  Answer Assessment - Initial Assessment Questions 1. ONSET: "When did the swelling start?" (e.g., minutes, hours, days)     Awhile back 2. LOCATION: "What part of the leg is swollen?"  "Are both legs swollen or just one leg?"     From ankle to knee (both legs) Right leg is reddened 3. SEVERITY: "How bad is the swelling?" (e.g., localized; mild, moderate, severe)  - Localized - small area of swelling localized to one leg  - MILD pedal edema - swelling limited to foot and ankle, pitting edema < 1/4 inch (6 mm) deep, rest and elevation eliminate most or all swelling  - MODERATE edema - swelling of lower leg to knee, pitting edema > 1/4 inch (6 mm) deep, rest and elevation only partially reduce swelling  - SEVERE edema - swelling extends above knee, facial or hand swelling present      Moderate 4. REDNESS: "Does the swelling look red or infected?"     Unsure 5. PAIN: "Is the swelling painful to touch?" If so, ask: "How painful is it?"   (Scale 1-10; mild, moderate or severe)     Mild 6. FEVER: "Do you have a fever?" If so, ask: "What is it, how was it measured, and when did it start?"      No 7. CAUSE: "What do you think is causing the leg swelling?"     Daughter is unsure 8. MEDICAL HISTORY: "Do you have a history of heart failure, kidney disease, liver failure, or cancer?"     No 9. RECURRENT SYMPTOM: "Have you had leg swelling before?" If so, ask: "When was the last time?" "What happened that time?"     Alfred Levins - has been swollen for years 10. OTHER SYMPTOMS: "Do you have any other symptoms?" (e.g., chest pain, difficulty breathing)       No 11.  PREGNANCY: "Is there any chance you are pregnant?" "When was your last menstrual period?"       No  Protocols used: LEG SWELLING AND EDEMA-A-AH

## 2017-07-31 ENCOUNTER — Other Ambulatory Visit: Payer: Medicare Other

## 2017-07-31 ENCOUNTER — Encounter: Payer: Self-pay | Admitting: Primary Care

## 2017-07-31 ENCOUNTER — Ambulatory Visit (INDEPENDENT_AMBULATORY_CARE_PROVIDER_SITE_OTHER): Payer: Medicare Other | Admitting: Primary Care

## 2017-07-31 VITALS — BP 124/60 | HR 73 | Temp 97.7°F | Ht 64.0 in | Wt 130.0 lb

## 2017-07-31 DIAGNOSIS — E785 Hyperlipidemia, unspecified: Secondary | ICD-10-CM | POA: Diagnosis not present

## 2017-07-31 DIAGNOSIS — I10 Essential (primary) hypertension: Secondary | ICD-10-CM

## 2017-07-31 DIAGNOSIS — M546 Pain in thoracic spine: Secondary | ICD-10-CM | POA: Diagnosis not present

## 2017-07-31 DIAGNOSIS — R600 Localized edema: Secondary | ICD-10-CM | POA: Diagnosis not present

## 2017-07-31 DIAGNOSIS — R278 Other lack of coordination: Secondary | ICD-10-CM | POA: Diagnosis not present

## 2017-07-31 DIAGNOSIS — S22080D Wedge compression fracture of T11-T12 vertebra, subsequent encounter for fracture with routine healing: Secondary | ICD-10-CM

## 2017-07-31 DIAGNOSIS — R2689 Other abnormalities of gait and mobility: Secondary | ICD-10-CM | POA: Diagnosis not present

## 2017-07-31 DIAGNOSIS — M6281 Muscle weakness (generalized): Secondary | ICD-10-CM | POA: Diagnosis not present

## 2017-07-31 LAB — LIPID PANEL
CHOL/HDL RATIO: 2
CHOLESTEROL: 141 mg/dL (ref 0–200)
HDL: 74 mg/dL (ref >39.00)
LDL CALC: 56 mg/dL (ref 0–99)
NONHDL: 67.08
Triglycerides: 53 mg/dL (ref 0.0–149.0)
VLDL: 10.6 mg/dL (ref 0.0–40.0)

## 2017-07-31 LAB — COMPREHENSIVE METABOLIC PANEL
ALBUMIN: 3.7 g/dL (ref 3.5–5.2)
ALT: 15 U/L (ref 0–35)
AST: 22 U/L (ref 0–37)
Alkaline Phosphatase: 72 U/L (ref 39–117)
BUN: 23 mg/dL (ref 6–23)
CHLORIDE: 93 meq/L — AB (ref 96–112)
CO2: 28 meq/L (ref 19–32)
CREATININE: 0.78 mg/dL (ref 0.40–1.20)
Calcium: 9.8 mg/dL (ref 8.4–10.5)
GFR: 73.38 mL/min (ref >60.00)
GLUCOSE: 93 mg/dL (ref 70–99)
Potassium: 4.5 mEq/L (ref 3.5–5.1)
SODIUM: 126 meq/L — AB (ref 135–145)
Total Bilirubin: 0.4 mg/dL (ref 0.2–1.2)
Total Protein: 7 g/dL (ref 6.0–8.3)

## 2017-07-31 LAB — BRAIN NATRIURETIC PEPTIDE: PRO B NATRI PEPTIDE: 467 pg/mL — AB (ref 0.0–100.0)

## 2017-07-31 MED ORDER — HYDRALAZINE HCL 25 MG PO TABS: 25.0000 mg | ORAL_TABLET | Freq: Two times a day (BID) | ORAL | 0 refills | Status: DC

## 2017-07-31 NOTE — Patient Instructions (Signed)
Take 1/2 tablet of your Bumex today, closely monitor your blood pressure.  You may take another 1/2 of your Bumex on Thursday if your swelling does not decrease.   Follow up with the cardiologist as scheduled.  I'll be in touch once I receive your lab results.  It was a pleasure to see you today!

## 2017-07-31 NOTE — Assessment & Plan Note (Signed)
Stable in the office today on current regimen. Changed hydralazine dosing to BID as this is her current regimen. Continue valsartan-HCTZ.

## 2017-07-31 NOTE — Progress Notes (Signed)
Subjective:    Patient ID: Heather Larson, female    DOB: 06-23-1923, 84 y.o.   MRN: [id number]  HPI  Heather Larson is a 84 year old female with a history of chronic atrial fibrillation, hypertension, chronic lower extremity edema who presents today with a chief complaint of lower extremity edema and erythema.  She was recently evaluated by physical therapy who noticed her erythema and edema and became worried. This was her first visit with physical therapy. Her symptoms are located to the right lower extremity which has been noticeable for several days.  She has a history of bilateral lower extremity edema that will wax and wane on both sides. She's had redness discoloration for years. She was once managed on Bumex 1 mg twice daily as needed for lower extremity edema per her prior PCP but stopped taking this several months due to urinary frequency and incontinence. She will seeing cardiology next week. She denies history of heart failure.   She's been compliant to her valsartan-HCTZ once daily, diltiazem 360 mg, and is taking hydralazine 25 mg BID (not TID). She thought the hydralazine was her diuretic. She denies lower extremity/calf pain, recent long travel, tobacco abuse, shortness of breath. She has been more active recently as she's moved to a new community and has made friends. She endorses some weight gain over the last several months due to dietary changes.   BP Readings from Last 3 Encounters:  07/31/17 124/60  06/15/17 140/66  04/16/17 (!) 145/84    Wt Readings from Last 3 Encounters:  07/31/17 130 lb (59 kg)  06/15/17 127 lb 12.8 oz (58 kg)  04/16/17 140 lb (63.5 kg)     Review of Systems  Constitutional: Negative for unexpected weight change.  Respiratory: Negative for shortness of breath.   Cardiovascular: Positive for leg swelling. Negative for chest pain.  Skin: Negative for rash and wound.       Chronic bilateral erythema       Past Medical History:  Diagnosis  Date  . A-fib ([place])   . Bilateral lower extremity edema   . Cancer [facility])    breast cancer at age 71 and skin  . Cellulitis    lower abdomen  . Hyperlipidemia   . Hypertension   . Hypertension   . Incontinence   . Low sodium levels   . Macular degeneration      Social History   Socioeconomic History  . Marital status: Divorced    Spouse name: Not on file  . Number of children: Not on file  . Years of education: Not on file  . Highest education level: Not on file  Occupational History  . Not on file  Social Needs  . Financial resource strain: Not on file  . Food insecurity:    Worry: Not on file    Inability: Not on file  . Transportation needs:    Medical: Not on file    Non-medical: Not on file  Tobacco Use  . Smoking status: Unknown If Ever Smoked  . Smokeless tobacco: Never Used  Substance and Sexual Activity  . Alcohol use: Yes    Alcohol/week: 1.2 oz    Types: 2 Shots of liquor per week    Comment: 2 drinks daily  . Drug use: No  . Sexual activity: Not on file  Lifestyle  . Physical activity:    Days per week: Not on file    Minutes per session: Not on file  . Stress: Not  on file  Relationships  . Social connections:    Talks on phone: Not on file    Gets together: Not on file    Attends religious service: Not on file    Active member of club or organization: Not on file    Attends meetings of clubs or organizations: Not on file    Relationship status: Not on file  . Intimate partner violence:    Fear of current or ex partner: Not on file    Emotionally abused: Not on file    Physically abused: Not on file    Forced sexual activity: Not on file  Other Topics Concern  . Not on file  Social History Narrative   Moved from [place].    Past Surgical History:  Procedure Laterality Date  . BREAST SURGERY     right mastectomy  . EYE SURGERY     x3  . MASTECTOMY     right  . SHOULDER OPEN ROTATOR CUFF REPAIR     left  . SKIN BIOPSY     skin  cancer    No family history on file.  No Known Allergies  Current Outpatient Medications on File Prior to Visit  Medication Sig Dispense Refill  . atorvastatin (LIPITOR) 10 MG tablet Take 10 mg by mouth daily.    . brimonidine (ALPHAGAN P) 0.1 % SOLN Place 1 drop into both eyes 2 (two) times daily.    Marland Kitchen diltiazem (TIAZAC) 360 MG 24 hr capsule Take 360 mg by mouth daily.    Marland Kitchen docusate sodium (COLACE) 100 MG capsule Take 1 capsule (100 mg total) by mouth daily. 10 capsule 0  . meloxicam (MOBIC) 7.5 MG tablet Take 7.5 mg by mouth daily.    . Multiple Vitamins-Minerals (MULTIVITAMIN ADULT) TABS Take 1 tablet by mouth daily.    . Rivaroxaban (XARELTO) 15 MG TABS tablet Take 15 mg by mouth daily.    . travoprost, benzalkonium, (TRAVATAN) 0.004 % ophthalmic solution Place 1 drop into both eyes at bedtime.    . valsartan-hydrochlorothiazide (DIOVAN-HCT) 160-25 MG tablet Take 1 tablet by mouth daily.    . vitamin C (ASCORBIC ACID) 500 MG tablet Take 500 mg by mouth daily.    . bumetanide (BUMEX) 1 MG tablet Take 1 mg by mouth daily. Take 1-2 tablets by mouth twice daily as needed.     No current facility-administered medications on file prior to visit.     BP 124/60   Pulse 73   Temp 97.7 F (36.5 C) (Oral)   Ht 5\' 4"  (1.626 m)   Wt 130 lb (59 kg)   SpO2 98%   BMI 22.31 kg/m    Objective:   Physical Exam  Constitutional: She appears well-nourished.  Neck: Neck supple.  Cardiovascular: Normal rate and regular rhythm.  Trace pitting bilaterally. Negative Homan's sign. Right calf measuring 15 inches in diameter, left calf measuring 14.5 inches in diameter.  Pulmonary/Chest: Effort normal and breath sounds normal. She has no rales.  No crackles  Skin: Skin is warm and dry.  Bilateral rubor color to lower extremities. No wounds, obvious cellulitis.           Assessment & Plan:

## 2017-07-31 NOTE — Assessment & Plan Note (Signed)
Chronic for years, also with dependant rubor discoloration likely from vascular disease. Edema noted on exam today, doubt DVT. Suspect secondary to increased activity, recommended to elevate extremities at rest.   Will have her try 0.5 mg of her Bumex today, then repeat in 2 days if no improvement. Check BNP today.   Will have her see cardiologist next week as scheduled. Added Bumex to med list, again recommended to start with 0.5 mg once today, then repeat in 2 days. She will monitor her BP.

## 2017-08-01 DIAGNOSIS — H43812 Vitreous degeneration, left eye: Secondary | ICD-10-CM | POA: Diagnosis not present

## 2017-08-01 DIAGNOSIS — H35371 Puckering of macula, right eye: Secondary | ICD-10-CM | POA: Diagnosis not present

## 2017-08-01 DIAGNOSIS — H353122 Nonexudative age-related macular degeneration, left eye, intermediate dry stage: Secondary | ICD-10-CM | POA: Diagnosis not present

## 2017-08-01 DIAGNOSIS — H353213 Exudative age-related macular degeneration, right eye, with inactive scar: Secondary | ICD-10-CM | POA: Diagnosis not present

## 2017-08-02 ENCOUNTER — Ambulatory Visit: Payer: Medicare Other | Admitting: Primary Care

## 2017-08-03 DIAGNOSIS — M6281 Muscle weakness (generalized): Secondary | ICD-10-CM | POA: Diagnosis not present

## 2017-08-03 DIAGNOSIS — M546 Pain in thoracic spine: Secondary | ICD-10-CM | POA: Diagnosis not present

## 2017-08-03 DIAGNOSIS — R2689 Other abnormalities of gait and mobility: Secondary | ICD-10-CM | POA: Diagnosis not present

## 2017-08-03 DIAGNOSIS — R278 Other lack of coordination: Secondary | ICD-10-CM | POA: Diagnosis not present

## 2017-08-03 NOTE — Progress Notes (Signed)
Cardiology Office Note  Date:  08/06/2017   ID:  Heather Larson, DOB 1923-12-07, MRN [id number]  PCP:  Pleas Koch, NP   Chief Complaint  Patient presents with  . New Patient (Initial Visit)    Patient referred by PCP for Afib. Patient c/o swelling in legs and ankles. Meds reviewed verbally with patient.     HPI:  Heather Larson is a 84 year old female with a history of  chronic atrial fibrillation, on Xarelto 15 mg daily, since October 2014 hypertension,  chronic lower extremity edema  Hyperlipidemia Permanent atrial fib, for 4 years Radical mastectomy Who presents by referral from Heather Larson for consultation of her lower extremity edema and erythema, atrial fibrillation  Reports having long history of leg swelling Previously was on Bumex daily but was bothered by frequent urination and incontinence, stopped several months ago  Recently took several doses for leg swelling at the urging of Heather Larson and her daughters  On diltiazem 360 mg daily for many years, for both rate control and blood pressure  Notes from outside cardiology indicating normal ejection fraction with biatrial enlargement Last echocardiogram 2014  Recent lab work reviewed with her, sodium 124 up to 126 on repeat She does add some sodium to her food  Outside echocardiogram 2014 walks with a walker, no recent falls 1.Normal left ventricular size and systolic function with estimated ejection fraction of 60% to 65%. 2.Biatrial enlargement. 3.Aortic sclerosis and trivial aortic regurgitation. 4.Mild mitral regurgitation. 5.Mild tricuspid regurgitation.  Lab work April 2019 Total cholesterol 141 LDL 56 Sodium 126 Creatinine 0.78 BUN 23 potassium 4.5 BNP 467  EKG personally reviewed by myself on todays visit Shows atrial fibrillation with rate 60 bpm no significant ST or T wave changes   PMH:   has a past medical history of A-fib ([place]), Bilateral lower extremity edema, Cancer  ([place]), Cellulitis, Hyperlipidemia, Hypertension, Hypertension, Incontinence, Low sodium levels, and Macular degeneration.  PSH:    Past Surgical History:  Procedure Laterality Date  . BREAST SURGERY     right mastectomy  . EYE SURGERY     x3  . MASTECTOMY     right  . SHOULDER OPEN ROTATOR CUFF REPAIR     left  . SKIN BIOPSY     skin cancer    Current Outpatient Medications  Medication Sig Dispense Refill  . atorvastatin (LIPITOR) 10 MG tablet Take 10 mg by mouth daily.    . brimonidine (ALPHAGAN P) 0.1 % SOLN Place 1 drop into both eyes 2 (two) times daily.    . bumetanide (BUMEX) 1 MG tablet Take 1 mg by mouth daily. Patient takes 1/2 tablet daily.    Marland Kitchen diltiazem (TIAZAC) 360 MG 24 hr capsule Take 1 capsule (360 mg total) by mouth daily. 30 capsule 0  . docusate sodium (COLACE) 100 MG capsule Take 1 capsule (100 mg total) by mouth daily. 10 capsule 0  . hydrALAZINE (APRESOLINE) 25 MG tablet Take 1 tablet (25 mg total) by mouth 2 (two) times daily. 180 tablet 0  . meloxicam (MOBIC) 7.5 MG tablet Take 7.5 mg by mouth daily.    . Multiple Vitamins-Minerals (MULTIVITAMIN ADULT) TABS Take 1 tablet by mouth daily.    . Rivaroxaban (XARELTO) 15 MG TABS tablet Take 15 mg by mouth daily.    . travoprost, benzalkonium, (TRAVATAN) 0.004 % ophthalmic solution Place 1 drop into both eyes at bedtime.    . valsartan-hydrochlorothiazide (DIOVAN-HCT) 160-25 MG tablet Take 1 tablet by mouth daily.    Marland Kitchen  vitamin C (ASCORBIC ACID) 500 MG tablet Take 500 mg by mouth daily.     No current facility-administered medications for this visit.      Allergies:   Patient has no known allergies.   Social History:  The patient  has an unknown smoking status. She has never used smokeless tobacco. She reports that she drinks about 1.2 oz of alcohol per week. She reports that she does not use drugs.   Family History:   family history is not on file.    Review of Systems: Review of Systems  Constitutional:  Negative.   Respiratory: Negative.   Cardiovascular: Positive for leg swelling.  Gastrointestinal: Negative.   Musculoskeletal: Negative.        Gait instability  Neurological: Negative.   Psychiatric/Behavioral: Negative.   All other systems reviewed and are negative.   PHYSICAL EXAM: VS:  BP 120/70 (BP Location: Left Arm, Patient Position: Sitting, Cuff Size: Normal)   Pulse 60   Ht 5\' 1"  (1.549 m)   Wt 128 lb (58.1 kg)   BMI 24.19 kg/m  , BMI Body mass index is 24.19 kg/m. GEN: Well nourished, well developed, in no acute distress  HEENT: normal  Neck: no JVD, carotid bruits, or masses Cardiac: Irregularly irregular no murmurs, rubs, or gallops, 1+ hard woody edema to below the knees bilaterally Respiratory:  clear to auscultation bilaterally, normal work of breathing GI: soft, nontender, nondistended, + BS MS: no deformity or atrophy  Skin: warm and dry, no rash Neuro:  Strength and sensation are intact Psych: euthymic mood, full affect  Recent Labs: 04/16/2017: Hemoglobin 11.9; Platelets 405 07/31/2017: ALT 15; BUN 23; Creatinine, Ser 0.78; Potassium 4.5; Pro B Natriuretic peptide (BNP) 467.0; Sodium 126    Lipid Panel Lab Results  Component Value Date   CHOL 141 07/31/2017   HDL 74.00 07/31/2017   LDLCALC 56 07/31/2017   TRIG 53.0 07/31/2017      Wt Readings from Last 3 Encounters:  08/06/17 128 lb (58.1 kg)  07/31/17 130 lb (59 kg)  06/15/17 127 lb 12.8 oz (58 kg)       ASSESSMENT AND PLAN:  Permanent atrial fibrillation (HCC) - Plan: EKG 12-Lead, ECHOCARDIOGRAM COMPLETE Rate is well controlled on diltiazem though I am concerned about possible side effects of the calcium channel blocker including lower extremity edema Recommended repeat echocardiogram to estimate right heart pressures If pressures are normal would then hold the diltiazem and start beta-blockers Would consider metoprolol succinate 50 mg daily for rate control May need higher dose  hydralazine for blood pressure control If pressures are elevated may need more frequent Bumex Would consider holding the HCTZ as this may be dropping her sodium  Bilateral lower extremity edema - Plan: EKG 12-Lead Concerned that the calcium channel blocker, diltiazem could be contributing to lower extremity edema, woody edema If normal right heart pressures, plan as above  Mixed hyperlipidemia Cholesterol is at goal on the current lipid regimen. No changes to the medications were made.  Essential hypertension After echocardiogram depending on right heart pressures will consider stopping diltiazem given side effect his lower extremity edema Potentially could increase hydralazine, even increasing valsartan Would avoid calcium channel blockers given leg swelling  Hyponatremia Low secondary to HCTZ, unable to exclude other etiologies Echocardiogram pending We will consider changing to valsartan alone Would take Bumex depending on right heart pressures  Disposition:   F/U  2 months   Total encounter time more than 60 minutes  Greater than 50%  was spent in counseling and coordination of care with the patient    Orders Placed This Encounter  Procedures  . EKG 12-Lead  . ECHOCARDIOGRAM COMPLETE     Signed, Esmond Plants, M.D., Ph.D. 08/06/2017  [place], [place]

## 2017-08-06 ENCOUNTER — Ambulatory Visit (INDEPENDENT_AMBULATORY_CARE_PROVIDER_SITE_OTHER): Payer: Medicare Other | Admitting: Cardiovascular Disease

## 2017-08-06 ENCOUNTER — Encounter: Payer: Self-pay | Admitting: Cardiovascular Disease

## 2017-08-06 ENCOUNTER — Other Ambulatory Visit: Payer: Self-pay | Admitting: Cardiovascular Disease

## 2017-08-06 VITALS — BP 120/70 | HR 60 | Ht 61.0 in | Wt 128.0 lb

## 2017-08-06 DIAGNOSIS — R600 Localized edema: Secondary | ICD-10-CM

## 2017-08-06 DIAGNOSIS — E871 Hypo-osmolality and hyponatremia: Secondary | ICD-10-CM | POA: Diagnosis not present

## 2017-08-06 DIAGNOSIS — E782 Mixed hyperlipidemia: Secondary | ICD-10-CM | POA: Diagnosis not present

## 2017-08-06 DIAGNOSIS — I482 Chronic atrial fibrillation: Secondary | ICD-10-CM

## 2017-08-06 DIAGNOSIS — I10 Essential (primary) hypertension: Secondary | ICD-10-CM

## 2017-08-06 DIAGNOSIS — I4821 Permanent atrial fibrillation: Secondary | ICD-10-CM

## 2017-08-06 MED ORDER — DILTIAZEM HCL ER BEADS 360 MG PO CP24
360.0000 mg | ORAL_CAPSULE | Freq: Every day | ORAL | 0 refills | Status: DC
Start: 2017-08-06 — End: 2017-08-20

## 2017-08-06 NOTE — Patient Instructions (Addendum)
Medication Instructions:   No medication changes made  Labwork:  No new labs needed  Testing/Procedures:  We will order an echocardiogram for leg swelling, atrial fibrillation   Follow-Up: It was a pleasure seeing you in the office today. Please call us if you have new issues that need to be addressed before your next appt.  [phone]  Your physician wants you to follow-up in: 2 months.    If you need a refill on your cardiac medications before your next appointment, please call your pharmacy.  For educational health videos Log in to : [email or web address] Or : [email or web address], password : [email or web address]

## 2017-08-07 DIAGNOSIS — M546 Pain in thoracic spine: Secondary | ICD-10-CM | POA: Diagnosis not present

## 2017-08-07 DIAGNOSIS — R278 Other lack of coordination: Secondary | ICD-10-CM | POA: Diagnosis not present

## 2017-08-07 DIAGNOSIS — R2689 Other abnormalities of gait and mobility: Secondary | ICD-10-CM | POA: Diagnosis not present

## 2017-08-07 DIAGNOSIS — M6281 Muscle weakness (generalized): Secondary | ICD-10-CM | POA: Diagnosis not present

## 2017-08-09 ENCOUNTER — Telehealth: Payer: Self-pay | Admitting: Cardiovascular Disease

## 2017-08-09 NOTE — Telephone Encounter (Signed)
08/06/17-Last office visit with Dr Rockey Situ 4/18- schedule for echo Routing to Dr Rockey Situ for clearance.

## 2017-08-09 NOTE — Telephone Encounter (Signed)
° °  [facility] Pre-operative Risk Assessment    Request for surgical clearance:  1. What type of surgery is being performed?  Vertebroplasty  2. When is this surgery scheduled? Not listed   3. What type of clearance is required (medical clearance vs. Pharmacy clearance to hold med vs. Both)?  Both   4. Are there any medications that need to be held prior to surgery and how long? Xarelto   5. Practice name and name of physician performing surgery?  [facility], provider not listed.   6. What is your office phone number  Phone [phone] 7.   What is your office fax number Fax: [phone] 8.   Anesthesia type (None, local, MAC, general) ?

## 2017-08-13 NOTE — Telephone Encounter (Signed)
Ok to hold xarelto 2 days prior to procedure (3 if they need it) Would restart when surgeon says ok,  TG

## 2017-08-14 ENCOUNTER — Telehealth: Payer: Self-pay | Admitting: Cardiovascular Disease

## 2017-08-14 NOTE — Telephone Encounter (Signed)
° °  [facility] Pre-operative Risk Assessment    Request for surgical clearance:  1. What type of surgery is being performed? Vertebroplasty    2. When is this surgery scheduled? Not listed   3. What type of clearance is required (medical clearance vs. Pharmacy clearance to hold med vs. Both)? Pharmacy   4. Are there any medications that need to be held prior to surgery and how long?  Xarelto and for 1 day   5. Practice name and name of physician performing surgery? [facility] [place]   6. What is your office phone number  [phone]   7.   What is your office fax number [phone]  8.   Anesthesia type (None, local, MAC, general) ?  Not listed

## 2017-08-14 NOTE — Telephone Encounter (Signed)
Faxed to [facility] via [employer].

## 2017-08-14 NOTE — Telephone Encounter (Signed)
Duplicate entry. See telephone encounter from 08/09/17. This has been addressed.

## 2017-08-16 ENCOUNTER — Ambulatory Visit (INDEPENDENT_AMBULATORY_CARE_PROVIDER_SITE_OTHER): Payer: Medicare Other

## 2017-08-16 ENCOUNTER — Other Ambulatory Visit: Payer: Self-pay

## 2017-08-16 DIAGNOSIS — I482 Chronic atrial fibrillation: Secondary | ICD-10-CM | POA: Diagnosis not present

## 2017-08-16 DIAGNOSIS — I4821 Permanent atrial fibrillation: Secondary | ICD-10-CM

## 2017-08-20 ENCOUNTER — Other Ambulatory Visit: Payer: Self-pay | Admitting: *Deleted

## 2017-08-20 MED ORDER — HYDRALAZINE HCL 50 MG PO TABS: 50.0000 mg | ORAL_TABLET | Freq: Three times a day (TID) | ORAL | 3 refills | Status: DC

## 2017-08-20 MED ORDER — METOPROLOL SUCCINATE ER 50 MG PO TB24: 50.0000 mg | ORAL_TABLET | Freq: Every day | ORAL | 3 refills | Status: DC

## 2017-08-22 DIAGNOSIS — R278 Other lack of coordination: Secondary | ICD-10-CM | POA: Diagnosis not present

## 2017-08-22 DIAGNOSIS — M6281 Muscle weakness (generalized): Secondary | ICD-10-CM | POA: Diagnosis not present

## 2017-08-22 DIAGNOSIS — M546 Pain in thoracic spine: Secondary | ICD-10-CM | POA: Diagnosis not present

## 2017-08-22 DIAGNOSIS — R2689 Other abnormalities of gait and mobility: Secondary | ICD-10-CM | POA: Diagnosis not present

## 2017-08-24 ENCOUNTER — Telehealth: Payer: Self-pay

## 2017-08-24 DIAGNOSIS — M818 Other osteoporosis without current pathological fracture: Secondary | ICD-10-CM

## 2017-08-24 NOTE — Telephone Encounter (Signed)
This has been notifed to patient's daughter Shauna Hugh. Lab appt 08/30/2017.  FYI.Marland KitchenMarland KitchenDiane wanted Anda Kraft to know the cardiologist had change some medications for patient. She wanted Anda Kraft to be aware.

## 2017-08-24 NOTE — Telephone Encounter (Signed)
Lab test ordered and pending.

## 2017-08-24 NOTE — Telephone Encounter (Signed)
Noted, cardiology note reviewed.

## 2017-08-24 NOTE — Telephone Encounter (Signed)
Copied from [place]. Topic: General - Other >> Aug 24, 2017 10:50 AM Valla Leaver wrote: Reason for CRM: Daughter, Shauna Hugh, calling to advise that the patient needs a CBC blood test for her imaging at [facility]. Its for a vertebrae in her back.  Her consultation is next Friday May 06/2017. Please give [name] a call once the orders is placed.

## 2017-08-29 DIAGNOSIS — M546 Pain in thoracic spine: Secondary | ICD-10-CM | POA: Diagnosis not present

## 2017-08-29 DIAGNOSIS — R278 Other lack of coordination: Secondary | ICD-10-CM | POA: Diagnosis not present

## 2017-08-29 DIAGNOSIS — R2689 Other abnormalities of gait and mobility: Secondary | ICD-10-CM | POA: Diagnosis not present

## 2017-08-29 DIAGNOSIS — M6281 Muscle weakness (generalized): Secondary | ICD-10-CM | POA: Diagnosis not present

## 2017-08-30 ENCOUNTER — Other Ambulatory Visit (INDEPENDENT_AMBULATORY_CARE_PROVIDER_SITE_OTHER): Payer: Medicare Other

## 2017-08-30 DIAGNOSIS — M818 Other osteoporosis without current pathological fracture: Secondary | ICD-10-CM | POA: Diagnosis not present

## 2017-08-30 DIAGNOSIS — D0439 Carcinoma in situ of skin of other parts of face: Secondary | ICD-10-CM | POA: Diagnosis not present

## 2017-08-30 DIAGNOSIS — C44329 Squamous cell carcinoma of skin of other parts of face: Secondary | ICD-10-CM | POA: Diagnosis not present

## 2017-08-30 LAB — CBC
HCT: 30.7 % — ABNORMAL LOW (ref 36.0–46.0)
Hemoglobin: 10.4 g/dL — ABNORMAL LOW (ref 12.0–15.0)
MCHC: 33.7 g/dL (ref 30.0–36.0)
MCV: 84.2 fl (ref 78.0–100.0)
PLATELETS: 464 10*3/uL — AB (ref 150.0–400.0)
RBC: 3.65 Mil/uL — ABNORMAL LOW (ref 3.87–5.11)
RDW: 13.9 % (ref 11.5–15.5)
WBC: 11.9 10*3/uL — ABNORMAL HIGH (ref 4.0–10.5)

## 2017-08-31 ENCOUNTER — Ambulatory Visit
Admission: RE | Admit: 2017-08-31 | Discharge: 2017-08-31 | Disposition: A | Discharge status: Home / Self Care | Payer: Medicare Other | Source: Ambulatory Visit | Attending: Neurosurgery | Admitting: Neurosurgery

## 2017-08-31 DIAGNOSIS — S22080D Wedge compression fracture of T11-T12 vertebra, subsequent encounter for fracture with routine healing: Secondary | ICD-10-CM

## 2017-08-31 DIAGNOSIS — S22080A Wedge compression fracture of T11-T12 vertebra, initial encounter for closed fracture: Secondary | ICD-10-CM | POA: Diagnosis not present

## 2017-09-03 ENCOUNTER — Other Ambulatory Visit: Payer: Self-pay | Admitting: Primary Care

## 2017-09-03 DIAGNOSIS — D649 Anemia, unspecified: Secondary | ICD-10-CM

## 2017-09-03 NOTE — Consult Note (Signed)
Chief Complaint: Patient was seen in consultation today for painful vertebral compression fracture at the request of Cram,Gary  Referring Physician(s): Cram,Gary  History of Present Illness: Heather Larson is a 84 y.o. female who developed lower thoracic pain in December 2018 while walking upstairs.  No fall or other significant trauma.  No known history of vertebral compression fracture.  No previous spinal surgery.  She rates her pain 5-6 out of 10 on the visual analog pain scale.  She was initially treated with bracing and Vicodin which she did not tolerate.  Her pain has subsided to some degree and is managed with Tylenol but incompletely.  Her pain limits the ability to sit, stand, or walk for prolonged periods.  This affects her activities of daily living.  She scores 12 out of 24 on the [employer] disability questionnaire.  No new bowel or bladder control issues.  She was accompanied today  by her daughter who contributed to her history.  Past Medical History:  Diagnosis Date  . A-fib ([place])   . Bilateral lower extremity edema   . Cancer [facility])    breast cancer at age 68 and skin  . Cellulitis    lower abdomen  . Hyperlipidemia   . Hypertension   . Hypertension   . Incontinence   . Low sodium levels   . Macular degeneration     Past Surgical History:  Procedure Laterality Date  . BREAST SURGERY     right mastectomy  . EYE SURGERY     x3  . MASTECTOMY     right  . SHOULDER OPEN ROTATOR CUFF REPAIR     left  . SKIN BIOPSY     skin cancer    Allergies: Patient has no known allergies.  Medications: Prior to Admission medications   Medication Sig Start Date End Date Taking? Authorizing Provider  atorvastatin (LIPITOR) 10 MG tablet Take 10 mg by mouth daily.    [provider]  brimonidine (ALPHAGAN P) 0.1 % SOLN Place 1 drop into both eyes 2 (two) times daily.    [provider]  bumetanide (BUMEX) 1 MG tablet Take 1 mg by mouth daily.  Patient takes 1/2 tablet daily.    [provider]  docusate sodium (COLACE) 100 MG capsule Take 1 capsule (100 mg total) by mouth daily. 04/16/17   Dorie Rank, MD  hydrALAZINE (APRESOLINE) 50 MG tablet Take 1 tablet (50 mg total) by mouth 3 (three) times daily. 08/20/17 11/18/17  Minna Merritts, MD  meloxicam (MOBIC) 7.5 MG tablet Take 7.5 mg by mouth daily.    [provider]  metoprolol succinate (TOPROL-XL) 50 MG 24 hr tablet Take 1 tablet (50 mg total) by mouth daily. Take with or immediately following a meal. 08/20/17 11/18/17  Minna Merritts, MD  Multiple Vitamins-Minerals (MULTIVITAMIN ADULT) TABS Take 1 tablet by mouth daily.    [provider]  Rivaroxaban (XARELTO) 15 MG TABS tablet Take 15 mg by mouth daily.    [provider]  travoprost, benzalkonium, (TRAVATAN) 0.004 % ophthalmic solution Place 1 drop into both eyes at bedtime.    [provider]  valsartan-hydrochlorothiazide (DIOVAN-HCT) 160-25 MG tablet Take 1 tablet by mouth daily.    [provider]  vitamin C (ASCORBIC ACID) 500 MG tablet Take 500 mg by mouth daily.    [provider]     No family history on file.  Social History   Socioeconomic History  .  Marital status: Divorced    Spouse name: Not on file  . Number of children: Not on file  . Years of education: Not on file  . Highest education level: Not on file  Occupational History  . Not on file  Social Needs  . Financial resource strain: Not on file  . Food insecurity:    Worry: Not on file    Inability: Not on file  . Transportation needs:    Medical: Not on file    Non-medical: Not on file  Tobacco Use  . Smoking status: Unknown If Ever Smoked  . Smokeless tobacco: Never Used  Substance and Sexual Activity  . Alcohol use: Yes    Alcohol/week: 1.2 oz    Types: 2 Shots of liquor per week    Comment: 2 drinks daily  . Drug use: No  . Sexual activity: Not on file  Lifestyle  .  Physical activity:    Days per week: Not on file    Minutes per session: Not on file  . Stress: Not on file  Relationships  . Social connections:    Talks on phone: Not on file    Gets together: Not on file    Attends religious service: Not on file    Active member of club or organization: Not on file    Attends meetings of clubs or organizations: Not on file    Relationship status: Not on file  Other Topics Concern  . Not on file  Social History Narrative   Moved from [place].    ECOG Status: 2 - Symptomatic, <50% confined to bed  Review of Systems: A 12 point ROS discussed and pertinent positives are indicated in the HPI above.  All other systems are negative.  Review of Systems  Vital Signs: There were no vitals taken for this visit.  Physical Exam Constitutional: Examined in chair.  Oriented to person, place, and time. Well-developed and well-nourished. No distress.  HENT:  Head: Normocephalic and atraumatic.  Eyes: Conjunctivae and EOM are normal. Right eye exhibits no discharge. Left eye exhibits no discharge. No scleral icterus.  Neck: No JVD present.  Pulmonary/Chest: Effort normal. No stridor. No respiratory distress. Tenderness over the lower thoracic spine in the midline. Abdomen: soft, non distended Neurological:  alert and oriented to person, place, and time.  Skin: Skin is warm and dry.  not diaphoretic.  Psychiatric:   normal mood and affect.   behavior is normal. Judgment and thought content normal.   Mallampati Score:     Imaging: MRI THORACIC SPINE WITHOUT CONTRAST  TECHNIQUE: Multiplanar, multisequence MR imaging of the thoracic spine was performed. No intravenous contrast was administered.  COMPARISON:  Lumbar spine MRI 04/25/2017  FINDINGS: Alignment: Increased thoracic kyphosis at the T11 level. Grade 1 T12-L1 and L1-L2 retrolisthesis.  Vertebrae: Progression of height loss at T11, now greater than 75% anteriorly. There is a central  osteonecrotic cleft of the T11 vertebral body. Chronic superior endplate fracture of L3 is unchanged. No acute fracture. No discitis-osteomyelitis.  Cord:  Normal caliber and signal.  Paraspinal and other soft tissues: Small pleural effusions, right larger than left.  Disc levels:  T2-T3: Small central disc protrusion narrowing the ventral thecal sac. No spinal canal stenosis.  T10-T11: 5 mm of retropulsion of the posterior wall of T11 effaces the ventral thecal sac causing mild spinal canal stenosis.  The other thoracic disc spaces show no disc herniation or stenosis.  IMPRESSION: 1. Progression of height loss at chronic  T11 compression fracture, now measuring greater than 75%, with persistent osteonecrotic cleft. 2. Unchanged chronic L3 superior endplate fracture. 3. Mild spinal canal stenosis at T10-T11 secondary to retropulsion of the posterior wall of T11. This has worsened from the prior study. 4. Right larger than left small pleural effusions.   Labs:  CBC: Recent Labs    04/16/17 1640 08/30/17 1219  WBC 16.3* 11.9*  HGB 11.9* 10.4*  HCT 34.4* 30.7*  PLT 405* 464.0*    COAGS: No results for input(s): INR, APTT in the last 8760 hours.  BMP: Recent Labs    04/16/17 1640 07/31/17 1131  NA 124* 126*  K 3.5 4.5  CL 88* 93*  CO2 26 28  GLUCOSE 118* 93  BUN 13 23  CALCIUM 9.3 9.8  CREATININE 0.61 0.78  GFRNONAA >60  --   GFRAA >60  --     LIVER FUNCTION TESTS: Recent Labs    07/31/17 1131  BILITOT 0.4  AST 22  ALT 15  ALKPHOS 72  PROT 7.0  ALBUMIN 3.7    TUMOR MARKERS: No results for input(s): AFPTM, CEA, CA199, CHROMGRNA in the last 8760 hours.  Assessment and Plan:  My impression is that this patient has persistent pain related to her subacute T11 compression fracture deformity, which is incompletely controlled by what pain medication she can tolerate, and does contribute to some diminution in her activities of daily living.   Anatomically, this would be approachable for percutaneous vertebroplasty/kyphoplasty. I reviewed with the patient and her daughter the pathophysiology of vertebral compression fracture deformities; treatment options including watchful waiting, surgical fixation, vertebroplasty and kyphoplasty; the details of the vertebroplasty/kyphoplasty technique, anticipated benefits, possible risks and side effects.  We reviewed the anatomy, and representative imaging from her prior MRI.  She seemed to understand, asked appropriate questions which were answered,  and was motivated to proceed with treatment.  Accordingly, we can set this up under moderate sedation as an outpatient at her convenience.  We will need to coordinate with PCP/cardiologist the discontinuation of her anticoagulation preprocedure.  Thank you for this interesting consult.  I greatly enjoyed meeting Heather Larson and look forward to participating in their care.  A copy of this report was sent to the requesting provider on this date.  Electronically Signed: Rickard Rhymes 09/03/2017, 12:29 PM   I spent a total of  40 Minutes   in face to face in clinical consultation, greater than 50% of which was counseling/coordinating care for painful subacute unhealed T11 compression fracture deformity.

## 2017-09-04 ENCOUNTER — Ambulatory Visit
Admission: RE | Admit: 2017-09-04 | Discharge: 2017-09-04 | Disposition: A | Discharge status: Home / Self Care | Payer: Medicare Other | Source: Ambulatory Visit | Attending: Neurosurgery | Admitting: Neurosurgery

## 2017-09-04 DIAGNOSIS — S22080D Wedge compression fracture of T11-T12 vertebra, subsequent encounter for fracture with routine healing: Secondary | ICD-10-CM | POA: Diagnosis not present

## 2017-09-04 MED ORDER — MIDAZOLAM HCL 2 MG/2ML IJ SOLN
1.0000 mg | INTRAMUSCULAR | Status: DC | PRN
  Administered 2017-09-04 (×2): 0.5 mg via INTRAVENOUS

## 2017-09-04 MED ORDER — SODIUM CHLORIDE 0.9 % IV SOLN
Freq: Once | INTRAVENOUS | Status: AC
  Administered 2017-09-04: 08:00:00 via INTRAVENOUS

## 2017-09-04 MED ORDER — KETOROLAC TROMETHAMINE 30 MG/ML IJ SOLN
30.0000 mg | Freq: Once | INTRAMUSCULAR | Status: AC
  Administered 2017-09-04: 30 mg via INTRAVENOUS

## 2017-09-04 MED ORDER — FENTANYL CITRATE (PF) 100 MCG/2ML IJ SOLN
25.0000 ug | INTRAMUSCULAR | Status: DC | PRN
  Administered 2017-09-04 (×2): 25 ug via INTRAVENOUS

## 2017-09-04 MED ORDER — CEFAZOLIN SODIUM-DEXTROSE 2-4 GM/100ML-% IV SOLN
2.0000 g | INTRAVENOUS | Status: AC
  Administered 2017-09-04: 2 g via INTRAVENOUS

## 2017-09-04 NOTE — Progress Notes (Signed)
38 minute sedation time for T11 VP.

## 2017-09-04 NOTE — Progress Notes (Signed)
Real time was 0831

## 2017-09-04 NOTE — Discharge Instructions (Signed)
Vertebroplasty Post Procedure Discharge Instructions  1. May resume a regular diet and any medications that you routinely take (including pain medications). 2. No driving day of procedure. 3. Upon discharge go home and rest for at least 4 hours.  May use an ice pack as needed to injection sites on back.  Ice to back 30 minutes on and 30 minutes off, all day. 4. May remove bandaids tomorrow after taking a shower. Replace daily with clean bandaid until healed. 5. Do not lift anything heavier than a milk jug. 6. Follow up with your attending physician in 2 weeks.    YOU MAY RESUME [place].   Please contact our office at [phone] for the following symptoms:   Fever greater than 100 degrees  Increased swelling, pain, or redness at injection site.   Thank you for visiting [facility].

## 2017-09-05 DIAGNOSIS — M6281 Muscle weakness (generalized): Secondary | ICD-10-CM | POA: Diagnosis not present

## 2017-09-05 DIAGNOSIS — R2689 Other abnormalities of gait and mobility: Secondary | ICD-10-CM | POA: Diagnosis not present

## 2017-09-05 DIAGNOSIS — R278 Other lack of coordination: Secondary | ICD-10-CM | POA: Diagnosis not present

## 2017-09-05 DIAGNOSIS — M546 Pain in thoracic spine: Secondary | ICD-10-CM | POA: Diagnosis not present

## 2017-09-06 DIAGNOSIS — Z4802 Encounter for removal of sutures: Secondary | ICD-10-CM | POA: Diagnosis not present

## 2017-09-10 ENCOUNTER — Telehealth: Payer: Self-pay | Admitting: Interventional Radiology

## 2017-09-10 ENCOUNTER — Other Ambulatory Visit (HOSPITAL_COMMUNITY): Payer: Self-pay | Admitting: Interventional Radiology

## 2017-09-10 DIAGNOSIS — G8929 Other chronic pain: Secondary | ICD-10-CM

## 2017-09-10 DIAGNOSIS — M545 Low back pain: Principal | ICD-10-CM

## 2017-09-10 NOTE — Telephone Encounter (Signed)
Pt called today c/o recurrent pain. She stated her pain improved post procedure. Over the past couple days she has new band-like pain inferior to the level of treatment, different than her initial presenting sx. No radioculopathy. No bowel/bladder control issues. No fever. Not sure what the issue is. Considerations: -adjacent level fracture -cement migration  -exacerbation of DDD/DJD -muscular/ligamentous strain due to altered biomechanics. I recommended tthat she come in to be seen. I am not at [facility] for the next 2 weeks so she may need to see one of my partners familiar with VP patietns. WIll ask Angelita Ingles to help schedule this.

## 2017-09-11 ENCOUNTER — Other Ambulatory Visit (HOSPITAL_COMMUNITY): Payer: Self-pay | Admitting: Radiology

## 2017-09-11 DIAGNOSIS — G8929 Other chronic pain: Secondary | ICD-10-CM

## 2017-09-11 DIAGNOSIS — M545 Low back pain: Principal | ICD-10-CM

## 2017-09-12 DIAGNOSIS — R2689 Other abnormalities of gait and mobility: Secondary | ICD-10-CM | POA: Diagnosis not present

## 2017-09-12 DIAGNOSIS — R278 Other lack of coordination: Secondary | ICD-10-CM | POA: Diagnosis not present

## 2017-09-12 DIAGNOSIS — M546 Pain in thoracic spine: Secondary | ICD-10-CM | POA: Diagnosis not present

## 2017-09-12 DIAGNOSIS — M6281 Muscle weakness (generalized): Secondary | ICD-10-CM | POA: Diagnosis not present

## 2017-09-17 ENCOUNTER — Encounter: Payer: Self-pay | Admitting: Radiology

## 2017-09-17 ENCOUNTER — Ambulatory Visit
Admission: RE | Admit: 2017-09-17 | Discharge: 2017-09-17 | Disposition: A | Discharge status: Home / Self Care | Payer: Medicare Other | Source: Ambulatory Visit | Attending: Interventional Radiology | Admitting: Interventional Radiology

## 2017-09-17 DIAGNOSIS — M545 Low back pain: Principal | ICD-10-CM

## 2017-09-17 DIAGNOSIS — S22080A Wedge compression fracture of T11-T12 vertebra, initial encounter for closed fracture: Secondary | ICD-10-CM | POA: Diagnosis not present

## 2017-09-17 DIAGNOSIS — G8929 Other chronic pain: Secondary | ICD-10-CM

## 2017-09-17 HISTORY — PX: IR RADIOLOGIST EVAL & MGMT: IMG5224

## 2017-09-18 DIAGNOSIS — S22080D Wedge compression fracture of T11-T12 vertebra, subsequent encounter for fracture with routine healing: Secondary | ICD-10-CM | POA: Diagnosis not present

## 2017-09-19 ENCOUNTER — Telehealth: Payer: Self-pay | Admitting: Primary Care

## 2017-09-19 DIAGNOSIS — M546 Pain in thoracic spine: Secondary | ICD-10-CM | POA: Diagnosis not present

## 2017-09-19 DIAGNOSIS — R278 Other lack of coordination: Secondary | ICD-10-CM | POA: Diagnosis not present

## 2017-09-19 DIAGNOSIS — E2839 Other primary ovarian failure: Secondary | ICD-10-CM

## 2017-09-19 DIAGNOSIS — M6281 Muscle weakness (generalized): Secondary | ICD-10-CM | POA: Diagnosis not present

## 2017-09-19 DIAGNOSIS — M810 Age-related osteoporosis without current pathological fracture: Secondary | ICD-10-CM

## 2017-09-19 DIAGNOSIS — R2689 Other abnormalities of gait and mobility: Secondary | ICD-10-CM | POA: Diagnosis not present

## 2017-09-19 NOTE — Telephone Encounter (Signed)
Spoke to pt's daughter Shauna Hugh who states pt has not had bone density in last 11yrs, and she was previously on a bone strengthening medicine and believes it may have been Boniva but she is unsure. Pt is currently doing PT at [facility]

## 2017-09-19 NOTE — Telephone Encounter (Signed)
Please call patient's daughter: Has the patient had a bone density scan within the last 2 years? Has she ever been treated with medication like Fosamax, Boniva, Prolia? These are medications to treat osteoporosis.  We can send her to physical therapy for strengthening. Are they interested?  Thanks.

## 2017-09-19 NOTE — Telephone Encounter (Signed)
We will need to get another bone density scan, i'll place the orders. Once that returns is she willing to restart treatment with medication?  Please apologize as I do now recall Heather Larson being active with PT at [facility].

## 2017-09-19 NOTE — Telephone Encounter (Signed)
Received note from patient's daughter. Place the paper in Kate's inbox.

## 2017-09-20 NOTE — Telephone Encounter (Signed)
Spoken and notified patient's daughter of Tawni Millers comments. Patient's daughter prefer [facility] if possible. Patient's daughter stated that she would like patient to but will have to ask patient when results come back.

## 2017-09-20 NOTE — Telephone Encounter (Signed)
Noted, orders switched to [facility] location for imaging.

## 2017-09-26 DIAGNOSIS — M6281 Muscle weakness (generalized): Secondary | ICD-10-CM | POA: Diagnosis not present

## 2017-09-26 DIAGNOSIS — M546 Pain in thoracic spine: Secondary | ICD-10-CM | POA: Diagnosis not present

## 2017-09-26 DIAGNOSIS — R278 Other lack of coordination: Secondary | ICD-10-CM | POA: Diagnosis not present

## 2017-09-26 DIAGNOSIS — R2689 Other abnormalities of gait and mobility: Secondary | ICD-10-CM | POA: Diagnosis not present

## 2017-09-30 NOTE — Progress Notes (Signed)
Cardiology Office Note  Date:  10/02/2017   ID:  Heather Larson, DOB [birth year], MRN [id number]  PCP:  Pleas Koch, NP   Chief Complaint  Patient presents with  . Other    Echo and 2 month follow up. Patient c/o swelling in ankles. Meds reviewed vebrally with patient.     HPI:  Ms. Heather Larson is a 84 year old female with a history of  chronic atrial fibrillation, on Xarelto 15 mg daily, since October 2014 hypertension,  chronic lower extremity edema  Hyperlipidemia Permanent atrial fib, for 4 years Radical mastectomy Who presents f/u of her  lower extremity edema and erythema, atrial fibrillation  Reports having long history of leg swelling Previously was on Bumex daily but was bothered by frequent urination and incontinence, stopped several months ago   On her last clinic visit we held the diltiazem 360 mg She has been on this for years Leg swelling today has improved Able to cross her legs, walking better using her walker  Without the diltiazem we did increase metoprolol succinate up to 50 ng daily hydralazine 50,  3 times a day Sounds like she is only taking hydralazine twice a day 9 AM and 3 PM,   Walking better with her walker, no recent falls  EKG personally reviewed by myself on todays visit Shows atrial fibrillation with ventricular rate 76 bpm PVCs  Notes from outside cardiology indicating normal ejection fraction with biatrial enlargement Last echocardiogram 2014  Echo 08/16/2017  shows normal cardiac function   lab work reviewed with her, sodium 124 up to 126 on repeat She does add some sodium to her food  Outside echocardiogram 2014 1.Normal left ventricular size and systolic function with estimated ejection fraction of 60% to 65%. 2.Biatrial enlargement. 3.Aortic sclerosis and trivial aortic regurgitation. 4.Mild mitral regurgitation. 5.Mild tricuspid regurgitation.  Lab work April 2019 Total cholesterol 141 LDL 56 Sodium  126 Creatinine 0.78 BUN 23 potassium 4.5 BNP 467    PMH:   has a past medical history of A-fib ([place]), Bilateral lower extremity edema, Cancer ([place]), Cellulitis, Hyperlipidemia, Hypertension, Hypertension, Incontinence, Low sodium levels, and Macular degeneration.  PSH:    Past Surgical History:  Procedure Laterality Date  . BREAST SURGERY     right mastectomy  . EYE SURGERY     x3  . IR RADIOLOGIST EVAL & MGMT  09/17/2017  . MASTECTOMY     right  . SHOULDER OPEN ROTATOR CUFF REPAIR     left  . SKIN BIOPSY     skin cancer    Current Outpatient Medications  Medication Sig Dispense Refill  . atorvastatin (LIPITOR) 10 MG tablet Take 10 mg by mouth daily.    . brimonidine (ALPHAGAN P) 0.1 % SOLN Place 1 drop into both eyes 2 (two) times daily.    . bumetanide (BUMEX) 1 MG tablet Take 1 mg by mouth daily. Patient takes 1/2 tablet daily.    Marland Kitchen docusate sodium (COLACE) 100 MG capsule Take 1 capsule (100 mg total) by mouth daily. 10 capsule 0  . hydrALAZINE (APRESOLINE) 50 MG tablet Take 1 tablet (50 mg total) by mouth 3 (three) times daily. 270 tablet 3  . meloxicam (MOBIC) 7.5 MG tablet Take 7.5 mg by mouth daily.    . metoprolol succinate (TOPROL-XL) 50 MG 24 hr tablet Take 1 tablet (50 mg total) by mouth daily. Take with or immediately following a meal. 90 tablet 3  . Multiple Vitamins-Minerals (MULTIVITAMIN ADULT) TABS Take 1 tablet by  mouth daily.    . Rivaroxaban (XARELTO) 15 MG TABS tablet Take 15 mg by mouth daily.    . travoprost, benzalkonium, (TRAVATAN) 0.004 % ophthalmic solution Place 1 drop into both eyes at bedtime.    . valsartan-hydrochlorothiazide (DIOVAN-HCT) 160-25 MG tablet Take 1 tablet by mouth daily.    . vitamin C (ASCORBIC ACID) 500 MG tablet Take 500 mg by mouth daily.     No current facility-administered medications for this visit.      Allergies:   Patient has no known allergies.   Social History:  The patient  reports that she has never smoked. She has  never used smokeless tobacco. She reports that she drinks about 1.2 oz of alcohol per week. She reports that she does not use drugs.   Family History:   family history is not on file.    Review of Systems: Review of Systems  Constitutional: Negative.   Respiratory: Negative.   Cardiovascular: Positive for leg swelling.  Gastrointestinal: Negative.   Musculoskeletal: Positive for back pain.       Gait instability  Neurological: Negative.   Psychiatric/Behavioral: Negative.   All other systems reviewed and are negative.   PHYSICAL EXAM: VS:  BP (!) 157/85 (BP Location: Left Arm, Patient Position: Sitting, Cuff Size: Normal)   Pulse 76   Ht 5\' 1"  (1.549 m)   Wt 130 lb (59 kg)   BMI 24.56 kg/m  , BMI Body mass index is 24.56 kg/m. GEN: Well nourished, well developed, in no acute distress  HEENT: normal  Neck: no JVD, carotid bruits, or masses Cardiac: Irregularly irregular no murmurs, rubs, or gallops, trace lower extremity edema mid shins down Respiratory:  clear to auscultation bilaterally, normal work of breathing GI: soft, nontender, nondistended, + BS MS: no deformity or atrophy  Skin: warm and dry, no rash Neuro:  Strength and sensation are intact Psych: euthymic mood, full affect  Recent Labs: 07/31/2017: ALT 15; BUN 23; Creatinine, Ser 0.78; Potassium 4.5; Pro B Natriuretic peptide (BNP) 467.0; Sodium 126 08/30/2017: Hemoglobin 10.4; Platelets 464.0    Lipid Panel Lab Results  Component Value Date   CHOL 141 07/31/2017   HDL 74.00 07/31/2017   LDLCALC 56 07/31/2017   TRIG 53.0 07/31/2017      Wt Readings from Last 3 Encounters:  10/02/17 130 lb (59 kg)  08/06/17 128 lb (58.1 kg)  07/31/17 130 lb (59 kg)       ASSESSMENT AND PLAN:  Permanent atrial fibrillation (HCC) -  We'll continue current medications Recommended she monitor her heart rate with physical therapy If heart rate runs fast such as 110 bpm with minimal exertion we would increase metoprolol  succinate up to 75 mg daily  Bilateral lower extremity edema - Plan: EKG 12-Lead Improved symptoms by holding diltiazem Would avoid calcium channel blockers in the future Recommended elevation, Ace wraps as needed, Bumex half dose for any worsening swelling  Mixed hyperlipidemia Cholesterol is at goal on the current lipid regimen. No changes to the medications were made.stable  Essential hypertension blood pressure stable at home, when measured by physical therapy Typically running 120s up to 130 Slightly elevated on today's visit, no changes made  Hyponatremia Low secondary to HCTZ, unable to exclude other etiologies Potentially could discontinue the HCTZ, increase metoprolol up to 75 daily Stay on valsartan and  Disposition:   F/U  6 months   Total encounter time more than 45 minutes  Greater than 50% was spent in counseling and  coordination of care with the patient    Orders Placed This Encounter  Procedures  . EKG 12-Lead     Signed, Esmond Plants, M.D., Ph.D. 10/02/2017  [place], [place]

## 2017-10-02 ENCOUNTER — Ambulatory Visit (INDEPENDENT_AMBULATORY_CARE_PROVIDER_SITE_OTHER): Payer: Medicare Other | Admitting: Cardiovascular Disease

## 2017-10-02 ENCOUNTER — Encounter: Payer: Self-pay | Admitting: Cardiovascular Disease

## 2017-10-02 ENCOUNTER — Other Ambulatory Visit: Payer: Self-pay | Admitting: Cardiovascular Disease

## 2017-10-02 VITALS — BP 157/85 | HR 76 | Ht 61.0 in | Wt 130.0 lb

## 2017-10-02 DIAGNOSIS — I10 Essential (primary) hypertension: Secondary | ICD-10-CM | POA: Diagnosis not present

## 2017-10-02 DIAGNOSIS — E782 Mixed hyperlipidemia: Secondary | ICD-10-CM | POA: Diagnosis not present

## 2017-10-02 DIAGNOSIS — R600 Localized edema: Secondary | ICD-10-CM

## 2017-10-02 DIAGNOSIS — E871 Hypo-osmolality and hyponatremia: Secondary | ICD-10-CM

## 2017-10-02 DIAGNOSIS — I482 Chronic atrial fibrillation, unspecified: Secondary | ICD-10-CM

## 2017-10-02 MED ORDER — HYDRALAZINE HCL 50 MG PO TABS: 50.0000 mg | ORAL_TABLET | Freq: Three times a day (TID) | ORAL | 3 refills | Status: DC

## 2017-10-02 MED ORDER — VALSARTAN-HYDROCHLOROTHIAZIDE 160-25 MG PO TABS: 1.0000 | ORAL_TABLET | Freq: Every day | ORAL | 3 refills | Status: DC

## 2017-10-02 MED ORDER — METOPROLOL SUCCINATE ER 50 MG PO TB24: 50.0000 mg | ORAL_TABLET | Freq: Every day | ORAL | 3 refills | Status: DC

## 2017-10-02 MED ORDER — RIVAROXABAN 15 MG PO TABS: 15.0000 mg | ORAL_TABLET | Freq: Every day | ORAL | 3 refills | Status: DC

## 2017-10-02 MED ORDER — BUMETANIDE 1 MG PO TABS: 1.0000 mg | ORAL_TABLET | Freq: Every day | ORAL | 1 refills | Status: DC | PRN

## 2017-10-02 MED ORDER — ATORVASTATIN CALCIUM 10 MG PO TABS: 10.0000 mg | ORAL_TABLET | Freq: Every day | ORAL | 3 refills | Status: DC

## 2017-10-02 NOTE — Telephone Encounter (Signed)
Refill Request for Bumex. Please advise if ok to refill.

## 2017-10-02 NOTE — Patient Instructions (Addendum)
Monitor heart rate If it goes >110 with minimal exertion, Call the office We could increase the metoprolol up to 1 1/2 pills a day  Take bumex 1/2 pill in the AM for worsening leg swelling   Medication Instructions:   No medication changes made  Labwork:  No new labs needed  Testing/Procedures:  No further testing at this time   Follow-Up: It was a pleasure seeing you in the office today. Please call us if you have new issues that need to be addressed before your next appt.  [phone]  Your physician wants you to follow-up in: 6 months.  You will receive a reminder letter in the mail two months in advance. If you don't receive a letter, please call our office to schedule the follow-up appointment.  If you need a refill on your cardiac medications before your next appointment, please call your pharmacy.  For educational health videos Log in to : [email or web address] Or : [email or web address], password : [email or web address]

## 2017-10-03 DIAGNOSIS — M6281 Muscle weakness (generalized): Secondary | ICD-10-CM | POA: Diagnosis not present

## 2017-10-03 DIAGNOSIS — R2689 Other abnormalities of gait and mobility: Secondary | ICD-10-CM | POA: Diagnosis not present

## 2017-10-03 DIAGNOSIS — R278 Other lack of coordination: Secondary | ICD-10-CM | POA: Diagnosis not present

## 2017-10-03 DIAGNOSIS — M546 Pain in thoracic spine: Secondary | ICD-10-CM | POA: Diagnosis not present

## 2017-10-04 DIAGNOSIS — C4441 Basal cell carcinoma of skin of scalp and neck: Secondary | ICD-10-CM | POA: Diagnosis not present

## 2017-10-10 DIAGNOSIS — R2689 Other abnormalities of gait and mobility: Secondary | ICD-10-CM | POA: Diagnosis not present

## 2017-10-10 DIAGNOSIS — M6281 Muscle weakness (generalized): Secondary | ICD-10-CM | POA: Diagnosis not present

## 2017-10-10 DIAGNOSIS — M546 Pain in thoracic spine: Secondary | ICD-10-CM | POA: Diagnosis not present

## 2017-10-10 DIAGNOSIS — R278 Other lack of coordination: Secondary | ICD-10-CM | POA: Diagnosis not present

## 2017-10-17 DIAGNOSIS — M546 Pain in thoracic spine: Secondary | ICD-10-CM | POA: Diagnosis not present

## 2017-10-17 DIAGNOSIS — R278 Other lack of coordination: Secondary | ICD-10-CM | POA: Diagnosis not present

## 2017-10-17 DIAGNOSIS — M6281 Muscle weakness (generalized): Secondary | ICD-10-CM | POA: Diagnosis not present

## 2017-10-17 DIAGNOSIS — R2689 Other abnormalities of gait and mobility: Secondary | ICD-10-CM | POA: Diagnosis not present

## 2017-10-24 DIAGNOSIS — M6281 Muscle weakness (generalized): Secondary | ICD-10-CM | POA: Diagnosis not present

## 2017-10-24 DIAGNOSIS — R278 Other lack of coordination: Secondary | ICD-10-CM | POA: Diagnosis not present

## 2017-10-24 DIAGNOSIS — R2689 Other abnormalities of gait and mobility: Secondary | ICD-10-CM | POA: Diagnosis not present

## 2017-10-24 DIAGNOSIS — M546 Pain in thoracic spine: Secondary | ICD-10-CM | POA: Diagnosis not present

## 2017-10-31 DIAGNOSIS — M546 Pain in thoracic spine: Secondary | ICD-10-CM | POA: Diagnosis not present

## 2017-10-31 DIAGNOSIS — R2689 Other abnormalities of gait and mobility: Secondary | ICD-10-CM | POA: Diagnosis not present

## 2017-10-31 DIAGNOSIS — R278 Other lack of coordination: Secondary | ICD-10-CM | POA: Diagnosis not present

## 2017-10-31 DIAGNOSIS — M6281 Muscle weakness (generalized): Secondary | ICD-10-CM | POA: Diagnosis not present

## 2017-11-06 DIAGNOSIS — R278 Other lack of coordination: Secondary | ICD-10-CM | POA: Diagnosis not present

## 2017-11-06 DIAGNOSIS — R2689 Other abnormalities of gait and mobility: Secondary | ICD-10-CM | POA: Diagnosis not present

## 2017-11-06 DIAGNOSIS — M6281 Muscle weakness (generalized): Secondary | ICD-10-CM | POA: Diagnosis not present

## 2017-11-06 DIAGNOSIS — M546 Pain in thoracic spine: Secondary | ICD-10-CM | POA: Diagnosis not present

## 2017-11-07 ENCOUNTER — Other Ambulatory Visit: Payer: Medicare Other

## 2017-11-08 ENCOUNTER — Other Ambulatory Visit (INDEPENDENT_AMBULATORY_CARE_PROVIDER_SITE_OTHER): Payer: Medicare Other

## 2017-11-08 DIAGNOSIS — D649 Anemia, unspecified: Secondary | ICD-10-CM

## 2017-11-08 LAB — IBC PANEL
IRON: 34 ug/dL — AB (ref 42–145)
Saturation Ratios: 7.9 % — ABNORMAL LOW (ref 20.0–50.0)
TRANSFERRIN: 308 mg/dL (ref 212.0–360.0)

## 2017-11-08 LAB — CBC
HEMATOCRIT: 33.3 % — AB (ref 36.0–46.0)
Hemoglobin: 11 g/dL — ABNORMAL LOW (ref 12.0–15.0)
MCHC: 33.1 g/dL (ref 30.0–36.0)
MCV: 80.7 fl (ref 78.0–100.0)
Platelets: 433 10*3/uL — ABNORMAL HIGH (ref 150.0–400.0)
RBC: 4.12 Mil/uL (ref 3.87–5.11)
RDW: 15.4 % (ref 11.5–15.5)
WBC: 9.6 10*3/uL (ref 4.0–10.5)

## 2017-11-11 ENCOUNTER — Inpatient Hospital Stay
Admission: EM | Admit: 2017-11-11 | Discharge: 2017-11-27 | DRG: 872 | Disposition: A | Discharge status: Home Health | Payer: Medicare Other | Attending: Internal Medicine | Admitting: Internal Medicine

## 2017-11-11 ENCOUNTER — Encounter: Payer: Self-pay | Admitting: *Deleted

## 2017-11-11 ENCOUNTER — Other Ambulatory Visit: Payer: Self-pay

## 2017-11-11 ENCOUNTER — Emergency Department: Payer: Medicare Other

## 2017-11-11 DIAGNOSIS — A0472 Enterocolitis due to Clostridium difficile, not specified as recurrent: Secondary | ICD-10-CM | POA: Diagnosis not present

## 2017-11-11 DIAGNOSIS — Z791 Long term (current) use of non-steroidal anti-inflammatories (NSAID): Secondary | ICD-10-CM | POA: Diagnosis not present

## 2017-11-11 DIAGNOSIS — E872 Acidosis: Secondary | ICD-10-CM | POA: Diagnosis present

## 2017-11-11 DIAGNOSIS — Z6822 Body mass index (BMI) 22.0-22.9, adult: Secondary | ICD-10-CM

## 2017-11-11 DIAGNOSIS — Z853 Personal history of malignant neoplasm of breast: Secondary | ICD-10-CM

## 2017-11-11 DIAGNOSIS — R509 Fever, unspecified: Secondary | ICD-10-CM | POA: Diagnosis not present

## 2017-11-11 DIAGNOSIS — Z79899 Other long term (current) drug therapy: Secondary | ICD-10-CM | POA: Diagnosis not present

## 2017-11-11 DIAGNOSIS — F419 Anxiety disorder, unspecified: Secondary | ICD-10-CM | POA: Diagnosis present

## 2017-11-11 DIAGNOSIS — E871 Hypo-osmolality and hyponatremia: Secondary | ICD-10-CM

## 2017-11-11 DIAGNOSIS — E785 Hyperlipidemia, unspecified: Secondary | ICD-10-CM | POA: Diagnosis present

## 2017-11-11 DIAGNOSIS — L03311 Cellulitis of abdominal wall: Secondary | ICD-10-CM | POA: Diagnosis present

## 2017-11-11 DIAGNOSIS — L03116 Cellulitis of left lower limb: Secondary | ICD-10-CM

## 2017-11-11 DIAGNOSIS — I482 Chronic atrial fibrillation, unspecified: Secondary | ICD-10-CM

## 2017-11-11 DIAGNOSIS — L03119 Cellulitis of unspecified part of limb: Secondary | ICD-10-CM

## 2017-11-11 DIAGNOSIS — R600 Localized edema: Secondary | ICD-10-CM | POA: Diagnosis present

## 2017-11-11 DIAGNOSIS — Z9011 Acquired absence of right breast and nipple: Secondary | ICD-10-CM | POA: Diagnosis not present

## 2017-11-11 DIAGNOSIS — R7989 Other specified abnormal findings of blood chemistry: Secondary | ICD-10-CM

## 2017-11-11 DIAGNOSIS — Z85828 Personal history of other malignant neoplasm of skin: Secondary | ICD-10-CM

## 2017-11-11 DIAGNOSIS — H3530 Unspecified macular degeneration: Secondary | ICD-10-CM | POA: Diagnosis present

## 2017-11-11 DIAGNOSIS — K567 Ileus, unspecified: Secondary | ICD-10-CM | POA: Diagnosis not present

## 2017-11-11 DIAGNOSIS — L03115 Cellulitis of right lower limb: Secondary | ICD-10-CM | POA: Diagnosis present

## 2017-11-11 DIAGNOSIS — E876 Hypokalemia: Secondary | ICD-10-CM

## 2017-11-11 DIAGNOSIS — Z9119 Patient's noncompliance with other medical treatment and regimen: Secondary | ICD-10-CM

## 2017-11-11 DIAGNOSIS — E440 Moderate protein-calorie malnutrition: Secondary | ICD-10-CM

## 2017-11-11 DIAGNOSIS — R945 Abnormal results of liver function studies: Secondary | ICD-10-CM

## 2017-11-11 DIAGNOSIS — A409 Streptococcal sepsis, unspecified: Secondary | ICD-10-CM | POA: Diagnosis present

## 2017-11-11 DIAGNOSIS — R0602 Shortness of breath: Secondary | ICD-10-CM | POA: Diagnosis not present

## 2017-11-11 DIAGNOSIS — R451 Restlessness and agitation: Secondary | ICD-10-CM | POA: Diagnosis not present

## 2017-11-11 DIAGNOSIS — E860 Dehydration: Secondary | ICD-10-CM | POA: Diagnosis present

## 2017-11-11 DIAGNOSIS — Z7189 Other specified counseling: Secondary | ICD-10-CM | POA: Diagnosis not present

## 2017-11-11 DIAGNOSIS — I10 Essential (primary) hypertension: Secondary | ICD-10-CM | POA: Diagnosis present

## 2017-11-11 DIAGNOSIS — Z66 Do not resuscitate: Secondary | ICD-10-CM | POA: Diagnosis present

## 2017-11-11 DIAGNOSIS — Z7901 Long term (current) use of anticoagulants: Secondary | ICD-10-CM

## 2017-11-11 DIAGNOSIS — Z8619 Personal history of other infectious and parasitic diseases: Secondary | ICD-10-CM

## 2017-11-11 DIAGNOSIS — A0471 Enterocolitis due to Clostridium difficile, recurrent: Secondary | ICD-10-CM

## 2017-11-11 DIAGNOSIS — A498 Other bacterial infections of unspecified site: Secondary | ICD-10-CM | POA: Diagnosis not present

## 2017-11-11 DIAGNOSIS — M7989 Other specified soft tissue disorders: Secondary | ICD-10-CM | POA: Diagnosis not present

## 2017-11-11 DIAGNOSIS — R609 Edema, unspecified: Secondary | ICD-10-CM

## 2017-11-11 DIAGNOSIS — I4891 Unspecified atrial fibrillation: Secondary | ICD-10-CM | POA: Diagnosis not present

## 2017-11-11 DIAGNOSIS — A419 Sepsis, unspecified organism: Secondary | ICD-10-CM

## 2017-11-11 HISTORY — DX: Cellulitis of right lower limb: L03.115

## 2017-11-11 HISTORY — DX: Cellulitis of right lower limb: L03.116

## 2017-11-11 LAB — COMPREHENSIVE METABOLIC PANEL
ALT: 80 U/L — ABNORMAL HIGH (ref 0–44)
ANION GAP: 10 (ref 5–15)
AST: 127 U/L — ABNORMAL HIGH (ref 15–41)
Albumin: 3.1 g/dL — ABNORMAL LOW (ref 3.5–5.0)
Alkaline Phosphatase: 94 U/L (ref 38–126)
BUN: 23 mg/dL (ref 8–23)
CO2: 20 mmol/L — AB (ref 22–32)
Calcium: 8.3 mg/dL — ABNORMAL LOW (ref 8.9–10.3)
Chloride: 98 mmol/L (ref 98–111)
Creatinine, Ser: 0.93 mg/dL (ref 0.44–1.00)
GFR calc non Af Amer: 52 mL/min — ABNORMAL LOW (ref >60)
GFR, EST AFRICAN AMERICAN: 60 mL/min — AB (ref >60)
GLUCOSE: 139 mg/dL — AB (ref 70–99)
POTASSIUM: 3.3 mmol/L — AB (ref 3.5–5.1)
SODIUM: 128 mmol/L — AB (ref 135–145)
Total Bilirubin: 0.6 mg/dL (ref 0.3–1.2)
Total Protein: 5.7 g/dL — ABNORMAL LOW (ref 6.5–8.1)

## 2017-11-11 LAB — URINALYSIS, COMPLETE (UACMP) WITH MICROSCOPIC
Bilirubin Urine: NEGATIVE
GLUCOSE, UA: NEGATIVE mg/dL
HGB URINE DIPSTICK: NEGATIVE
Ketones, ur: NEGATIVE mg/dL
LEUKOCYTES UA: NEGATIVE
NITRITE: NEGATIVE
PROTEIN: NEGATIVE mg/dL
Specific Gravity, Urine: 1.019 (ref 1.005–1.030)
Squamous Epithelial / LPF: NONE SEEN (ref 0–5)
pH: 5 (ref 5.0–8.0)

## 2017-11-11 LAB — LIPASE, BLOOD: Lipase: 28 U/L (ref 11–51)

## 2017-11-11 LAB — CBC WITH DIFFERENTIAL/PLATELET
Basophils Absolute: 0 10*3/uL (ref 0–0.1)
Basophils Relative: 0 %
Eosinophils Absolute: 0 10*3/uL (ref 0–0.7)
Eosinophils Relative: 0 %
HEMATOCRIT: 29.5 % — AB (ref 35.0–47.0)
HEMOGLOBIN: 9.9 g/dL — AB (ref 12.0–16.0)
Lymphocytes Relative: 2 %
Lymphs Abs: 0.2 10*3/uL — ABNORMAL LOW (ref 1.0–3.6)
MCH: 26.9 pg (ref 26.0–34.0)
MCHC: 33.5 g/dL (ref 32.0–36.0)
MCV: 80.3 fL (ref 80.0–100.0)
Monocytes Absolute: 0.4 10*3/uL (ref 0.2–0.9)
Monocytes Relative: 2 %
NEUTROS ABS: 15.7 10*3/uL — AB (ref 1.4–6.5)
Neutrophils Relative %: 96 %
Platelets: 326 10*3/uL (ref 150–440)
RBC: 3.67 MIL/uL — AB (ref 3.80–5.20)
RDW: 15.4 % — ABNORMAL HIGH (ref 11.5–14.5)
WBC: 16.3 10*3/uL — AB (ref 3.6–11.0)

## 2017-11-11 LAB — PROCALCITONIN: PROCALCITONIN: 18.42 ng/mL

## 2017-11-11 LAB — PROTIME-INR
INR: 3.09
Prothrombin Time: 31.6 seconds — ABNORMAL HIGH (ref 11.4–15.2)

## 2017-11-11 LAB — TROPONIN I

## 2017-11-11 LAB — BRAIN NATRIURETIC PEPTIDE: B NATRIURETIC PEPTIDE 5: 1537 pg/mL — AB (ref 0.0–100.0)

## 2017-11-11 LAB — LACTIC ACID, PLASMA
LACTIC ACID, VENOUS: 3.7 mmol/L — AB (ref 0.5–1.9)
LACTIC ACID, VENOUS: 3.3 mmol/L — AB (ref 0.5–1.9)

## 2017-11-11 MED ORDER — IBUPROFEN 400 MG PO TABS
400.0000 mg | ORAL_TABLET | Freq: Four times a day (QID) | ORAL | Status: DC | PRN
  Filled 2017-11-11 (×2): qty 1

## 2017-11-11 MED ORDER — POTASSIUM CHLORIDE CRYS ER 20 MEQ PO TBCR
40.0000 meq | EXTENDED_RELEASE_TABLET | Freq: Once | ORAL | Status: AC
  Administered 2017-11-14: 40 meq via ORAL
  Filled 2017-11-11: qty 2

## 2017-11-11 MED ORDER — ONDANSETRON HCL 4 MG PO TABS: 4.0000 mg | ORAL_TABLET | Freq: Four times a day (QID) | ORAL | Status: DC | PRN

## 2017-11-11 MED ORDER — SODIUM CHLORIDE 0.9 % IV SOLN
2.0000 g | INTRAVENOUS | Status: DC
  Administered 2017-11-12: 2 g via INTRAVENOUS
  Filled 2017-11-11: qty 2

## 2017-11-11 MED ORDER — ONDANSETRON HCL 4 MG/2ML IJ SOLN
4.0000 mg | Freq: Four times a day (QID) | INTRAMUSCULAR | Status: DC | PRN
  Administered 2017-11-20: 4 mg via INTRAVENOUS
  Filled 2017-11-11: qty 2

## 2017-11-11 MED ORDER — TRAVOPROST (BAK FREE) 0.004 % OP SOLN
1.0000 [drp] | Freq: Every day | OPHTHALMIC | Status: DC
  Administered 2017-11-11 – 2017-11-26 (×15): 1 [drp] via OPHTHALMIC
  Filled 2017-11-11: qty 2.5

## 2017-11-11 MED ORDER — DIPHENHYDRAMINE HCL 25 MG PO CAPS
50.0000 mg | ORAL_CAPSULE | Freq: Every evening | ORAL | Status: DC | PRN
  Administered 2017-11-12 – 2017-11-13 (×2): 50 mg via ORAL
  Filled 2017-11-11 (×2): qty 2

## 2017-11-11 MED ORDER — DOCUSATE SODIUM 100 MG PO CAPS
100.0000 mg | ORAL_CAPSULE | Freq: Every day | ORAL | Status: DC
  Administered 2017-11-12 – 2017-11-13 (×2): 100 mg via ORAL
  Filled 2017-11-11 (×2): qty 1

## 2017-11-11 MED ORDER — ATORVASTATIN CALCIUM 10 MG PO TABS
10.0000 mg | ORAL_TABLET | Freq: Every day | ORAL | Status: DC
  Administered 2017-11-12 – 2017-11-27 (×16): 10 mg via ORAL
  Filled 2017-11-11 (×16): qty 1

## 2017-11-11 MED ORDER — ACETAMINOPHEN 650 MG RE SUPP: 650.0000 mg | Freq: Four times a day (QID) | RECTAL | Status: DC | PRN

## 2017-11-11 MED ORDER — BRIMONIDINE TARTRATE 0.15 % OP SOLN
1.0000 [drp] | Freq: Two times a day (BID) | OPHTHALMIC | Status: DC
Start: 2017-11-11 — End: 2017-11-27
  Administered 2017-11-11 – 2017-11-27 (×28): 1 [drp] via OPHTHALMIC
  Filled 2017-11-11: qty 5

## 2017-11-11 MED ORDER — BISACODYL 10 MG RE SUPP
10.0000 mg | Freq: Every day | RECTAL | Status: DC | PRN
  Filled 2017-11-11: qty 1

## 2017-11-11 MED ORDER — ENOXAPARIN SODIUM 40 MG/0.4ML ~~LOC~~ SOLN: 40.0000 mg | SUBCUTANEOUS | Status: DC

## 2017-11-11 MED ORDER — VANCOMYCIN HCL IN DEXTROSE 1-5 GM/200ML-% IV SOLN
1000.0000 mg | Freq: Once | INTRAVENOUS | Status: AC
  Administered 2017-11-11: 1000 mg via INTRAVENOUS
  Filled 2017-11-11: qty 200

## 2017-11-11 MED ORDER — MELOXICAM 7.5 MG PO TABS
7.5000 mg | ORAL_TABLET | Freq: Every day | ORAL | Status: DC
  Administered 2017-11-12 – 2017-11-27 (×16): 7.5 mg via ORAL
  Filled 2017-11-11 (×16): qty 1

## 2017-11-11 MED ORDER — POLYETHYLENE GLYCOL 3350 17 G PO PACK
17.0000 g | PACK | Freq: Every day | ORAL | Status: DC | PRN
  Administered 2017-11-16: 17 g via ORAL
  Filled 2017-11-11 (×2): qty 1

## 2017-11-11 MED ORDER — ALBUTEROL SULFATE (2.5 MG/3ML) 0.083% IN NEBU
2.5000 mg | INHALATION_SOLUTION | RESPIRATORY_TRACT | Status: DC | PRN
  Administered 2017-11-12 – 2017-11-20 (×3): 2.5 mg via RESPIRATORY_TRACT
  Filled 2017-11-11 (×4): qty 3

## 2017-11-11 MED ORDER — ACETAMINOPHEN 325 MG PO TABS
650.0000 mg | ORAL_TABLET | Freq: Four times a day (QID) | ORAL | Status: DC | PRN
  Administered 2017-11-11 – 2017-11-20 (×7): 650 mg via ORAL
  Filled 2017-11-11 (×8): qty 2

## 2017-11-11 MED ORDER — LACTATED RINGERS IV BOLUS (SEPSIS)
1500.0000 mL | Freq: Once | INTRAVENOUS | Status: AC
  Administered 2017-11-11: 1500 mL via INTRAVENOUS

## 2017-11-11 MED ORDER — RIVAROXABAN 15 MG PO TABS
15.0000 mg | ORAL_TABLET | Freq: Every day | ORAL | Status: DC
  Administered 2017-11-12 – 2017-11-27 (×16): 15 mg via ORAL
  Filled 2017-11-11 (×17): qty 1

## 2017-11-11 MED ORDER — METOPROLOL SUCCINATE ER 50 MG PO TB24
50.0000 mg | ORAL_TABLET | Freq: Every day | ORAL | Status: DC
  Administered 2017-11-12 – 2017-11-14 (×3): 50 mg via ORAL
  Filled 2017-11-11 (×3): qty 1

## 2017-11-11 MED ORDER — VANCOMYCIN HCL IN DEXTROSE 750-5 MG/150ML-% IV SOLN
750.0000 mg | INTRAVENOUS | Status: DC
  Administered 2017-11-12: 750 mg via INTRAVENOUS
  Filled 2017-11-11: qty 150

## 2017-11-11 MED ORDER — SODIUM CHLORIDE 0.9 % IV SOLN
2.0000 g | Freq: Once | INTRAVENOUS | Status: AC
  Administered 2017-11-11: 2 g via INTRAVENOUS
  Filled 2017-11-11: qty 2

## 2017-11-11 MED ORDER — IPRATROPIUM-ALBUTEROL 0.5-2.5 (3) MG/3ML IN SOLN
3.0000 mL | Freq: Once | RESPIRATORY_TRACT | Status: AC
  Administered 2017-11-11: 3 mL via RESPIRATORY_TRACT
  Filled 2017-11-11: qty 3

## 2017-11-11 NOTE — Consult Note (Signed)
Pharmacy Antibiotic Note  Katriana Dortch is a 84 y.o. female admitted on 11/11/2017 with cellulitis.  Pharmacy has been consulted for Vancomycin and Cefepime dosing. Patient received Vancomycin 1g IV and Cefepime 2g IV x 1 dose in ED.   Plan: Ke: 0.029   T1/2: 23.8   Vd: 41.3   Start Vancomycin 750 IV every 24 hours with 18 hour stack dosing.  Goal trough 10-15 mcg/mL. Trough level ordered prior to 4th dose. Will monitor renal function and adjust dose as needed.   Start Cefepime 2g IV e very 24 hours based on current CrCl of 30.33mL/min.   Height: 5\' 2"  (157.5 cm) Weight: 130 lb (59 kg) IBW/kg (Calculated) : 50.1  Temp (24hrs), Avg:99.8 F (37.7 C), Min:99.3 F (37.4 C), Max:100.3 F (37.9 C)  Recent Labs  Lab 11/08/17 1001 11/11/17 1612  WBC 9.6 16.3*  CREATININE  --  0.93  LATICACIDVEN  --  3.7*    Estimated Creatinine Clearance: 30.5 mL/min (by C-G formula based on SCr of 0.93 mg/dL).    No Known Allergies  Antimicrobials this admission: 7/14 Cefepime >>  7/14 Vancomycin  >>   Microbiology results: 7/14 BCx: pending 7/14 UCx: pending  Thank you for allowing pharmacy to be a part of this patient's care.  Pernell Dupre, PharmD, BCPS Clinical Pharmacist 11/11/2017 6:07 PM

## 2017-11-11 NOTE — ED Provider Notes (Signed)
[facility] Emergency Department Provider Note  ____________________________________________   First MD Initiated Contact with Patient 11/11/17 [phone]     (approximate)  I have reviewed the triage vital signs and the nursing notes.   HISTORY  Chief Complaint No chief complaint on file.  Level 5 caveat:  history/ROS limited by acute/critical illness  HPI Heather Larson is a 84 y.o. female with extensive chronic medical history but who, in spite of her age, lives independently at [facility].  She presents by EMS for evaluation of shortness of breath.  However, she reports that the issue is her fever and the redness of her legs spreading up onto her abdomen.  She says she thinks she might of been shortness of breath while she was at home but that is no longer a problem for her.  She states that she has had cellulitis several times in the past and she does not know why.  She denies having any redness streaking up her legs until today although she does report that she has had some chronic redness of bilateral lower extremities in the past.  She states that she takes a fluid pill to keep down the swelling.  She reports feeling febrile and EMS reported that she had a fever of 101 for them.  She is tachycardic.  She denies any pain in her legs or her abdomen.  She states that she has had a rash on her lower abdomen in the past but she tries to take good care of it and keep the area clean and dry.  She denies chest and abdominal pain.  She says that she was feeling nauseated last night and has had some dry heaves but has not had any emesis production.  Overall she describes her symptoms as severe and states that she just does not feel well in general and feels weak.  Past Medical History:  Diagnosis Date  . A-fib ([place])   . Bilateral lower extremity edema   . Cancer [facility])    breast cancer at age 62 and skin  . Cellulitis    lower abdomen  . Hyperlipidemia   . Hypertension    . Hypertension   . Incontinence   . Low sodium levels   . Macular degeneration     Patient Active Problem List   Diagnosis Date Noted  . Hyponatremia 08/06/2017  . Bilateral lower extremity edema 07/31/2017  . Macular degeneration 06/15/2017  . Chronic atrial fibrillation ([place]) 06/15/2017  . Essential hypertension 06/15/2017  . Urinary incontinence 06/15/2017  . Osteoporosis 06/15/2017  . Hyperlipemia 06/15/2017  . Dependent edema 11/14/2016  . Glaucoma of both eyes 10/08/2014  . Cellulitis 09/29/2014  . Personal history of other malignant neoplasm of skin 05/13/2014  . Wrist fracture, closed 05/11/2014  . Closed head injury 10/28/2013  . Hand laceration 10/28/2013    Past Surgical History:  Procedure Laterality Date  . BREAST SURGERY     right mastectomy  . EYE SURGERY     x3  . IR RADIOLOGIST EVAL & MGMT  09/17/2017  . MASTECTOMY     right  . SHOULDER OPEN ROTATOR CUFF REPAIR     left  . SKIN BIOPSY     skin cancer    Prior to Admission medications   Medication Sig Start Date End Date Taking? Authorizing Provider  atorvastatin (LIPITOR) 10 MG tablet Take 1 tablet (10 mg total) by mouth daily. 10/02/17   Minna Merritts, MD  brimonidine (ALPHAGAN P)  0.1 % SOLN Place 1 drop into both eyes 2 (two) times daily.    [provider]  bumetanide (BUMEX) 1 MG tablet Take 1 tablet (1 mg total) by mouth daily as needed. Patient takes 1/2 tablet daily. 10/02/17   Minna Merritts, MD  docusate sodium (COLACE) 100 MG capsule Take 1 capsule (100 mg total) by mouth daily. 04/16/17   Dorie Rank, MD  hydrALAZINE (APRESOLINE) 50 MG tablet Take 1 tablet (50 mg total) by mouth 3 (three) times daily. 10/02/17   Minna Merritts, MD  meloxicam (MOBIC) 7.5 MG tablet Take 7.5 mg by mouth daily.    [provider]  metoprolol succinate (TOPROL-XL) 50 MG 24 hr tablet Take 1 tablet (50 mg total) by mouth daily. Take with or immediately following a meal. 10/02/17   Gollan,  Kathlene November, MD  Multiple Vitamins-Minerals (MULTIVITAMIN ADULT) TABS Take 1 tablet by mouth daily.    [provider]  Rivaroxaban (XARELTO) 15 MG TABS tablet Take 1 tablet (15 mg total) by mouth daily. 10/02/17   Minna Merritts, MD  travoprost, benzalkonium, (TRAVATAN) 0.004 % ophthalmic solution Place 1 drop into both eyes at bedtime.    [provider]  valsartan-hydrochlorothiazide (DIOVAN-HCT) 160-25 MG tablet Take 1 tablet by mouth daily. 10/02/17   Minna Merritts, MD  vitamin C (ASCORBIC ACID) 500 MG tablet Take 500 mg by mouth daily.    [provider]    Allergies Patient has no known allergies.  History reviewed. No pertinent family history.  Social History Social History   Tobacco Use  . Smoking status: Never Smoker  . Smokeless tobacco: Never Used  Substance Use Topics  . Alcohol use: Yes    Alcohol/week: 1.2 oz    Types: 2 Shots of liquor per week    Comment: 2 drinks daily  . Drug use: No    Review of Systems Constitutional: +fever, general malaise and weakness Eyes: No visual changes. ENT: No sore throat. Cardiovascular: Denies chest pain. Respiratory: +shortness of breath, now resolved Gastrointestinal: No abdominal pain.  Nausea and dry heaves.  No diarrhea.  No constipation. Genitourinary: Negative for dysuria. Musculoskeletal: Negative for neck pain.  Negative for back pain. Integumentary: Some chronic redness of bilateral lower extremities, but now she has severe redness streaking up both legs and across her lower abdomen Neurological: Negative for headaches, focal weakness or numbness.   ____________________________________________   PHYSICAL EXAM:  VITAL SIGNS: ED Triage Vitals  Enc Vitals Group     BP 11/11/17 1609 130/66     Pulse Rate 11/11/17 1609 (!) 107     Resp 11/11/17 1609 (!) 22     Temp 11/11/17 1609 99.3 F (37.4 C)     Temp Source 11/11/17 1609 Oral     SpO2 11/11/17 1609 98 %     Weight 11/11/17  1610 59 kg (130 lb)     Height 11/11/17 1610 1.575 m (5\' 2" )     Head Circumference --      Peak Flow --      Pain Score 11/11/17 1610 0     Pain Loc --      Pain Edu? --      Excl. in [place]? --     Constitutional: Very alert and oriented and a good historian in spite of age.  However she is ill-appearing. Eyes: Conjunctivae are normal.  Head: Atraumatic. Nose: No congestion/rhinnorhea. Mouth/Throat: Mucous membranes are dry. Neck: No stridor.  No meningeal  signs.   Cardiovascular: Normal rate, regular rhythm. Good peripheral circulation. Grossly normal heart sounds. Respiratory: Normal respiratory effort.  No retractions. Lungs CTAB. Gastrointestinal: Soft and nontender. No distention.  Musculoskeletal: Bilateral lower extremity trace pitting edema but with severe erythema throughout the lower extremities, streaking up both thighs primarily on the lateral aspect but almost circumferential.  The redness/cellulitis then continues throughout her lower abdomen with an area that appears almost excoriated on the right side of her lower abdomen.  In spite of this she denies any tenderness to palpation. Neurologic:  Normal speech and language. No gross focal neurologic deficits are appreciated.  Skin: See musculoskeletal exam above Psychiatric: Mood and affect are normal. Speech and behavior are normal.  ____________________________________________   LABS (all labs ordered are listed, but only abnormal results are displayed)  Labs Reviewed  LACTIC ACID, PLASMA - Abnormal; Notable for the following components:      Result Value   Lactic Acid, Venous 3.7 (*)    All other components within normal limits  COMPREHENSIVE METABOLIC PANEL - Abnormal; Notable for the following components:   Sodium 128 (*)    Potassium 3.3 (*)    CO2 20 (*)    Glucose, Bld 139 (*)    Calcium 8.3 (*)    Total Protein 5.7 (*)    Albumin 3.1 (*)    AST 127 (*)    ALT 80 (*)    GFR calc non Af Amer 52 (*)    GFR  calc Af Amer 60 (*)    All other components within normal limits  BRAIN NATRIURETIC PEPTIDE - Abnormal; Notable for the following components:   B Natriuretic Peptide 1,537.0 (*)    All other components within normal limits  CBC WITH DIFFERENTIAL/PLATELET - Abnormal; Notable for the following components:   WBC 16.3 (*)    RBC 3.67 (*)    Hemoglobin 9.9 (*)    HCT 29.5 (*)    RDW 15.4 (*)    Neutro Abs 15.7 (*)    Lymphs Abs 0.2 (*)    All other components within normal limits  PROTIME-INR - Abnormal; Notable for the following components:   Prothrombin Time 31.6 (*)    All other components within normal limits  URINALYSIS, COMPLETE (UACMP) WITH MICROSCOPIC - Abnormal; Notable for the following components:   Color, Urine YELLOW (*)    APPearance CLEAR (*)    Bacteria, UA RARE (*)    All other components within normal limits  CULTURE, BLOOD (ROUTINE X 2)  CULTURE, BLOOD (ROUTINE X 2)  URINE CULTURE  LIPASE, BLOOD  TROPONIN I  PROCALCITONIN  LACTIC ACID, PLASMA   ____________________________________________  EKG  ED ECG REPORT I, Hinda Kehr, the attending physician, personally viewed and interpreted this ECG.  Date: 11/11/2017 EKG Time: 16: 05 Rate: 108 Rhythm: A. fib with borderline RVR QRS Axis: Borderline right axis deviation Intervals: Abnormal secondary to atrial fibrillation, otherwise unremarkable ST/T Wave abnormalities: Non-specific ST segment / T-wave changes, but no evidence of acute ischemia. Narrative Interpretation: no evidence of acute ischemia   ____________________________________________  RADIOLOGY I, Hinda Kehr, personally viewed and evaluated these images (plain radiographs) as part of my medical decision making, as well as reviewing the written report by the radiologist.  ED MD interpretation: No acute abnormality identified on chest x-ray including no evidence of pulmonary edema nor pneumonia  Official radiology report(s): Dg Chest Port 1  View  Result Date: 11/11/2017 CLINICAL DATA:  Shortness of breath. EXAM: PORTABLE CHEST  1 VIEW COMPARISON:  None. FINDINGS: The heart is borderline enlarged. Normal pulmonary vascularity. No focal consolidation, pleural effusion, or pneumothorax. No acute osseous abnormality. Old right ninth and tenth posterior rib fractures. IMPRESSION: No active disease. Electronically Signed   By: Titus Dubin M.D.   On: 11/11/2017 16:42    ____________________________________________   PROCEDURES  Critical Care performed: Yes, see critical care procedure note(s)   Procedure(s) performed:   .Critical Care Performed by: Hinda Kehr, MD Authorized by: Hinda Kehr, MD   Critical care provider statement:    Critical care time (minutes):  30   Critical care time was exclusive of:  Separately billable procedures and treating other patients   Critical care was necessary to treat or prevent imminent or life-threatening deterioration of the following conditions:  Sepsis   Critical care was time spent personally by me on the following activities:  Development of treatment plan with patient or surrogate, discussions with consultants, evaluation of patient's response to treatment, examination of patient, obtaining history from patient or surrogate, ordering and performing treatments and interventions, ordering and review of laboratory studies, ordering and review of radiographic studies, pulse oximetry, re-evaluation of patient's condition and review of old charts     ____________________________________________   INITIAL IMPRESSION / [place] / ED COURSE  As part of my medical decision making, I reviewed the following data within the [place] notes reviewed and incorporated, Labs reviewed , EKG interpreted , Old EKG reviewed, Old chart reviewed, Radiograph reviewed  and Discussed with admitting physician (Dr. Vianne Bulls)    Differential diagnosis includes, but  is not limited to, sepsis, cellulitis, pneumonia, urinary tract infection, Fournier's gangrene, intra-abdominal infection.  The patient is very sharp and quickwitted and in no acute distress in spite of her obvious illness.  She is tachycardic and febrile to 101 for EMS and 100.3 for [place].  With the obvious cellulitis she easily meets sepsis criteria and I initiated a code sepsis immediately upon her arrival and upon seeing her.  I ordered 1.5 L of crystalloid to reach the 30 mL/kg goal for severe sepsis as she received 500 mL by EMS.  I ordered cefepime 2 g IV and vancomycin 1 g IV for broad-spectrum antibiotic coverage for cellulitis, because Zosyn can be nephrotoxic and I am concerned she may have acute renal failure in the setting of sepsis.  The patient remains alert and denies having any pain.  She also denies shortness of breath at this time.  Broad evaluation is pending but the patient will require admission.   Clinical Course as of Nov 11 1728  Sun Nov 11, 2017  1704 Lactic Acid, Venous(!!): 3.7 [CF]  1706 No evidence of UTI  Urinalysis, Complete w Microscopic(!) [CF]  1706 No concerning findings on urogenital exam per nursing staff including no crepitus and the patient has no tenderness to palpation that would be suggestive of Fournier's gangrene.  She does have extensive cellulitis which I believe is the cause of the sepsis.  Labs are notable for hyponatremia at 128, mild hypokalemia 3.3, mild transaminase elevation of unknown significance, slightly decreased CO2 of 20, but a lactic acid of 3.7.  She also has a BNP of 1537 but with no evidence of pulmonary edema on chest x-ray and no old BNP's against which to compare.  This is concerning in the setting of sepsis and the need for volume and I think that the benefits of 30 mL/kg outweigh the risk, but her volume  should be watched carefully as well as her respiratory status.  CBC is notable for a leukocytosis of 16.3 and she has a normal urinalysis.   As documented above she has gotten empiric antibiotics and her blood pressure has remained stable although she remains tachycardic.  I have paged the hospitalist team for admission.   [CF]  7897 No evidence of acute ischemia on EKG, and I verified on her old EKG that she does have chronic atrial fibrillation.   [CF]  1729 Spoke with phone with Dr. Darvin Neighbours with the hospitalist service who will admit.  I also shared with him my concerns about her BNP the possibility of respiratory status decline because of her need for IV fluids.  He agrees with my current plan and will make the necessary changes or adjustments with the admission orders.   [CF]    Clinical Course User Index [CF] Hinda Kehr, MD    ____________________________________________  FINAL CLINICAL IMPRESSION(S) / ED DIAGNOSES  Final diagnoses:  Sepsis, due to unspecified organism ([place])  Cellulitis of lower extremity, unspecified laterality  Cellulitis of abdominal wall  Elevated brain natriuretic peptide (BNP) level  Elevated LFTs  Chronic atrial fibrillation (HCC)  Hyponatremia  Hypokalemia     MEDICATIONS GIVEN DURING THIS VISIT:  Medications  lactated ringers bolus 1,500 mL (1,500 mLs Intravenous New Bag/Given 11/11/17 1631)  vancomycin (VANCOCIN) IVPB 1000 mg/200 mL premix (1,000 mg Intravenous New Bag/Given 11/11/17 1655)  ceFEPIme (MAXIPIME) 2 g in sodium chloride 0.9 % 100 mL IVPB (2 g Intravenous New Bag/Given 11/11/17 1643)     ED Discharge Orders    None       Note:  This document was prepared using Dragon voice recognition software and may include unintentional dictation errors.    Hinda Kehr, MD 11/11/17 1730

## 2017-11-11 NOTE — H&P (Signed)
[place] [facility] [place] NAME: Heather Larson    MR#:  [id number]  DATE OF BIRTH:  1923-09-08  DATE OF ADMISSION:  11/11/2017  PRIMARY CARE PHYSICIAN: Pleas Koch, NP   REQUESTING/REFERRING PHYSICIAN: Dr. Karma Greaser  CHIEF COMPLAINT:  No chief complaint on file. Chills, weakness, shortness of breath  HISTORY OF PRESENT ILLNESS:  Heather Larson  is a 84 y.o. female with a known history of atrial fibrillation, chronic hyponatremia, chronic lower extremity edema, hypertension presents from independent living facility after her apartment manager and friend and found out that she had fever.  Patient had some nausea yesterday and felt weak.  Today she developed mild shortness of breath which is resolved at this time.  No cough or chest pain.  No abdominal pain.  She does not have any tenderness in her legs.  Redness extends from her feet all the way up to lower abdomen bilaterally.  Had similar episode twice with the last one being 2 years back.  She does have chronic lower committee edema but no diagnosis of CHF.  Follows with Dr. Rockey Situ of cardiology.  Recently her Cardizem was changed to metoprolol to see if that helps her lower extremity edema.  PAST MEDICAL HISTORY:   Past Medical History:  Diagnosis Date  . A-fib ([place])   . Bilateral lower extremity edema   . Cancer [facility])    breast cancer at age 41 and skin  . Cellulitis    lower abdomen  . Hyperlipidemia   . Hypertension   . Hypertension   . Incontinence   . Low sodium levels   . Macular degeneration     PAST SURGICAL HISTORY:   Past Surgical History:  Procedure Laterality Date  . BREAST SURGERY     right mastectomy  . EYE SURGERY     x3  . IR RADIOLOGIST EVAL & MGMT  09/17/2017  . MASTECTOMY     right  . SHOULDER OPEN ROTATOR CUFF REPAIR     left  . SKIN BIOPSY     skin cancer    SOCIAL HISTORY:   Social History   Tobacco Use  . Smoking status: Never Smoker  .  Smokeless tobacco: Never Used  Substance Use Topics  . Alcohol use: Yes    Alcohol/week: 1.2 oz    Types: 2 Shots of liquor per week    Comment: 2 drinks daily    FAMILY HISTORY:  History reviewed. No pertinent family history.  DRUG ALLERGIES:  No Known Allergies  REVIEW OF SYSTEMS:   Review of Systems  Constitutional: Positive for chills, fever and malaise/fatigue. Negative for weight loss.  HENT: Negative for hearing loss and nosebleeds.   Eyes: Negative for blurred vision, double vision and pain.  Respiratory: Negative for cough, hemoptysis, sputum production, shortness of breath and wheezing.   Cardiovascular: Positive for leg swelling. Negative for chest pain, palpitations and orthopnea.  Gastrointestinal: Negative for abdominal pain, constipation, diarrhea, nausea and vomiting.  Genitourinary: Negative for dysuria and hematuria.  Musculoskeletal: Positive for myalgias. Negative for back pain and falls.  Skin: Negative for rash.  Neurological: Negative for dizziness, tremors, sensory change, speech change, focal weakness, seizures and headaches.  Endo/Heme/Allergies: Does not bruise/bleed easily.  Psychiatric/Behavioral: Negative for depression and memory loss. The patient is not nervous/anxious.     MEDICATIONS AT HOME:   Prior to Admission medications   Medication Sig Start Date End Date Taking? Authorizing Provider  atorvastatin (LIPITOR) 10 MG  tablet Take 1 tablet (10 mg total) by mouth daily. 10/02/17   Minna Merritts, MD  brimonidine (ALPHAGAN P) 0.1 % SOLN Place 1 drop into both eyes 2 (two) times daily.    [provider]  bumetanide (BUMEX) 1 MG tablet Take 1 tablet (1 mg total) by mouth daily as needed. Patient takes 1/2 tablet daily. 10/02/17   Minna Merritts, MD  docusate sodium (COLACE) 100 MG capsule Take 1 capsule (100 mg total) by mouth daily. 04/16/17   Dorie Rank, MD  hydrALAZINE (APRESOLINE) 50 MG tablet Take 1 tablet (50 mg total) by mouth 3  (three) times daily. 10/02/17   Minna Merritts, MD  meloxicam (MOBIC) 7.5 MG tablet Take 7.5 mg by mouth daily.    [provider]  metoprolol succinate (TOPROL-XL) 50 MG 24 hr tablet Take 1 tablet (50 mg total) by mouth daily. Take with or immediately following a meal. 10/02/17   Gollan, Kathlene November, MD  Multiple Vitamins-Minerals (MULTIVITAMIN ADULT) TABS Take 1 tablet by mouth daily.    [provider]  Rivaroxaban (XARELTO) 15 MG TABS tablet Take 1 tablet (15 mg total) by mouth daily. 10/02/17   Minna Merritts, MD  travoprost, benzalkonium, (TRAVATAN) 0.004 % ophthalmic solution Place 1 drop into both eyes at bedtime.    [provider]  valsartan-hydrochlorothiazide (DIOVAN-HCT) 160-25 MG tablet Take 1 tablet by mouth daily. 10/02/17   Minna Merritts, MD  vitamin C (ASCORBIC ACID) 500 MG tablet Take 500 mg by mouth daily.    [provider]     VITAL SIGNS:  Blood pressure 137/68, pulse (!) 120, temperature 100.3 F (37.9 C), temperature source Rectal, resp. rate 20, height 5\' 2"  (1.575 m), weight 59 kg (130 lb), SpO2 98 %.  PHYSICAL EXAMINATION:  Physical Exam  GENERAL:  84 y.o.-year-old patient lying in the bed with no acute distress.  EYES: Pupils equal, round, reactive to light and accommodation. No scleral icterus. Extraocular muscles intact.  HEENT: Head atraumatic, normocephalic. Oropharynx and nasopharynx clear. No oropharyngeal erythema, moist oral mucosa  NECK:  Supple, no jugular venous distention. No thyroid enlargement, no tenderness.  LUNGS: Normal breath sounds bilaterally, no wheezing, rales, rhonchi. No use of accessory muscles of respiration.  CARDIOVASCULAR: S1, S2 normal. No murmurs, rubs, or gallops.  ABDOMEN: Soft, nontender, nondistended. Bowel sounds present. No organomegaly or mass.  EXTREMITIES: B/L LE redness and warmth. No crepitus or open ulcers. Extends up to lower abdomen. NEUROLOGIC: Cranial nerves II through XII are  intact. No focal Motor or sensory deficits appreciated b/l PSYCHIATRIC: The patient is alert and oriented x 3. Good affect.  SKIN: No obvious rash, lesion, or ulcer.   LABORATORY PANEL:   CBC Recent Labs  Lab 11/11/17 1612  WBC 16.3*  HGB 9.9*  HCT 29.5*  PLT 326   ------------------------------------------------------------------------------------------------------------------  Chemistries  Recent Labs  Lab 11/11/17 1612  NA 128*  K 3.3*  CL 98  CO2 20*  GLUCOSE 139*  BUN 23  CREATININE 0.93  CALCIUM 8.3*  AST 127*  ALT 80*  ALKPHOS 94  BILITOT 0.6   ------------------------------------------------------------------------------------------------------------------  Cardiac Enzymes Recent Labs  Lab 11/11/17 1612  TROPONINI <0.03   ------------------------------------------------------------------------------------------------------------------  RADIOLOGY:  Dg Chest Port 1 View  Result Date: 11/11/2017 CLINICAL DATA:  Shortness of breath. EXAM: PORTABLE CHEST 1 VIEW COMPARISON:  None. FINDINGS: The heart is borderline enlarged. Normal pulmonary vascularity. No focal consolidation, pleural effusion, or pneumothorax. No acute osseous abnormality. Old  right ninth and tenth posterior rib fractures. IMPRESSION: No active disease. Electronically Signed   By: Titus Dubin M.D.   On: 11/11/2017 16:42     IMPRESSION AND PLAN:   * B/L LE cellulitis with sepsis POA IVF resuscitation Repeat lactic acid in 3 hrs Start broad spectrum abx.  Vancomycin and cefepime Blood cx sent and peding  * pAfib. On Xarelto Continue home meds Rapid ventricular rate due to sepsis.  Cardiac monitoring ordered Continue Toprol 50 mg.  Will give one dose now.  * HTN Continue home medications  * Chronic mild hyponatremia is stable  All the records are reviewed and case discussed with ED provider. Management plans discussed with the patient, family and they are in agreement.  CODE  STATUS: DNR  TOTAL TIME TAKING CARE OF THIS PATIENT: 35 minutes.   Neita Carp M.D on 11/11/2017 at 5:34 PM  Between 7am to 6pm - Pager - [phone]  After 6pm go to [email or web address] - password [email or web address] [facility]  Office  [phone]  CC: Primary care physician; Pleas Koch, NP  Note: This dictation was prepared with Dragon dictation along with smaller phrase technology. Any transcriptional errors that result from this process are unintentional.

## 2017-11-11 NOTE — Progress Notes (Signed)
Upon initial assessment at shift change, pt noted to have audible wheezing. Pt was repositioned on high fowlers, was offered oxygen inhalation but pt declined. Paged Dr. Darvin Neighbours, ordered to give one time dose of breathing treatment. Respiratory therapist Mikki Santee made aware. Will continue to monitor.

## 2017-11-11 NOTE — Progress Notes (Signed)
CODE SEPSIS - PHARMACY COMMUNICATION  **Broad Spectrum Antibiotics should be administered within 1 hour of Sepsis diagnosis**  Time Code Sepsis Called/Page Received: @ 1615  Antibiotics Ordered: Cefepime                                       Vancomycin   Time of 1st antibiotic administration: @ 1643  Additional action taken by pharmacy: NA  If necessary, Name of Provider/Nurse Contacted: NA  Pernell Dupre, PharmD, BCPS Clinical Pharmacist 11/11/2017 4:48 PM

## 2017-11-11 NOTE — ED Triage Notes (Signed)
Pt here from [facility] via EMS for complaints of shortness of breath but presents with very red and hot legs bilaterally from foot to hips/groin area. Pt is A& O and a descent historian

## 2017-11-11 NOTE — Progress Notes (Signed)
Repeat lactic acid is 3.2. Patient has received 2 liters Ramona and starting to wheeze now. No further IVF at this time

## 2017-11-11 NOTE — Progress Notes (Signed)
   Advance care planning  Purpose of Encounter Discussed regarding acute hospitalization with sepsis, CODE STATUS discussion  Parties in Attendance Patient.  Patient's friend was at bedside and stated with patient's permission, but did not participate in the discussion  Patients Decisional capacity Patient is alert and oriented.  Able to make medical decisions.  Heather Larson and everli rother her daughters are her 54.   Discussed with patient regarding bilateral lower extremity extensive cellulitis and sepsis.  We discussed regarding prognosis, treatment plan.  Answered all questions.  Patient has documented CODE STATUS.  She tells me that her daughters are aware and she has documentation of DO NOT RESUSCITATE and DO NOT INTUBATE.  Orders entered.  Time spent - 17 minutes

## 2017-11-11 NOTE — Progress Notes (Signed)
CRITICAL VALUE ALERT  Critical value received:  Lactic acid - 3.3  Date of notification:  11/11/2017  Time of notification:  1939  Critical value read back:Yes.    Nurse who received alert:  Maretta Bees., RN  MD notified (1st page):  Dr. Darvin Neighbours  Time of first page:  1944  MD notified (2nd page):  Time of second page:  Responding MD:  Dr. Darvin Neighbours, no orders made.  Time MD responded:  1947

## 2017-11-12 LAB — LACTIC ACID, PLASMA: Lactic Acid, Venous: 1 mmol/L (ref 0.5–1.9)

## 2017-11-12 LAB — BASIC METABOLIC PANEL
ANION GAP: 9 (ref 5–15)
BUN: 25 mg/dL — ABNORMAL HIGH (ref 8–23)
CO2: 23 mmol/L (ref 22–32)
Calcium: 8.3 mg/dL — ABNORMAL LOW (ref 8.9–10.3)
Chloride: 94 mmol/L — ABNORMAL LOW (ref 98–111)
Creatinine, Ser: 0.76 mg/dL (ref 0.44–1.00)
GFR calc non Af Amer: >60 mL/min (ref >60)
Glucose, Bld: 117 mg/dL — ABNORMAL HIGH (ref 70–99)
POTASSIUM: 3.7 mmol/L (ref 3.5–5.1)
Sodium: 126 mmol/L — ABNORMAL LOW (ref 135–145)

## 2017-11-12 LAB — BLOOD CULTURE ID PANEL (REFLEXED)
Acinetobacter baumannii: NOT DETECTED
CANDIDA KRUSEI: NOT DETECTED
CANDIDA PARAPSILOSIS: NOT DETECTED
Candida albicans: NOT DETECTED
Candida glabrata: NOT DETECTED
Candida tropicalis: NOT DETECTED
ENTEROCOCCUS SPECIES: NOT DETECTED
ESCHERICHIA COLI: NOT DETECTED
Enterobacter cloacae complex: NOT DETECTED
Enterobacteriaceae species: NOT DETECTED
Haemophilus influenzae: NOT DETECTED
KLEBSIELLA OXYTOCA: NOT DETECTED
KLEBSIELLA PNEUMONIAE: NOT DETECTED
LISTERIA MONOCYTOGENES: NOT DETECTED
Neisseria meningitidis: NOT DETECTED
PROTEUS SPECIES: NOT DETECTED
Pseudomonas aeruginosa: NOT DETECTED
SERRATIA MARCESCENS: NOT DETECTED
STAPHYLOCOCCUS AUREUS BCID: NOT DETECTED
STREPTOCOCCUS PYOGENES: NOT DETECTED
Staphylococcus species: NOT DETECTED
Streptococcus agalactiae: NOT DETECTED
Streptococcus pneumoniae: NOT DETECTED
Streptococcus species: DETECTED — AB

## 2017-11-12 LAB — CBC
HEMATOCRIT: 30.8 % — AB (ref 35.0–47.0)
HEMOGLOBIN: 10.3 g/dL — AB (ref 12.0–16.0)
MCH: 26.8 pg (ref 26.0–34.0)
MCHC: 33.3 g/dL (ref 32.0–36.0)
MCV: 80.5 fL (ref 80.0–100.0)
Platelets: 296 10*3/uL (ref 150–440)
RBC: 3.83 MIL/uL (ref 3.80–5.20)
RDW: 15.8 % — ABNORMAL HIGH (ref 11.5–14.5)
WBC: 24.1 10*3/uL — ABNORMAL HIGH (ref 3.6–11.0)

## 2017-11-12 LAB — URINE CULTURE: CULTURE: NO GROWTH

## 2017-11-12 MED ORDER — SODIUM CHLORIDE 0.9 % IV SOLN
2.0000 g | INTRAVENOUS | Status: DC
  Administered 2017-11-12 – 2017-11-16 (×5): 2 g via INTRAVENOUS
  Filled 2017-11-12 (×2): qty 20
  Filled 2017-11-12 (×4): qty 2

## 2017-11-12 NOTE — Progress Notes (Signed)
PHARMACY - PHYSICIAN COMMUNICATION CRITICAL VALUE ALERT - BLOOD CULTURE IDENTIFICATION (BCID)  No results found for this or any previous visit.  Name of physician (or Provider) Contacted: Marcille Blanco   Changes to prescribed antibiotics required:   No, will continue pt on Vancomycin and Cefepime   Heather Larson D 11/12/2017  5:06 AM

## 2017-11-12 NOTE — Plan of Care (Signed)
  Problem: Education: Goal: Knowledge of General Education information will improve Outcome: Progressing   Problem: Health Behavior/Discharge Planning: Goal: Ability to manage health-related needs will improve Outcome: Progressing   Problem: Clinical Measurements: Goal: Ability to maintain clinical measurements within normal limits will improve Outcome: Progressing Goal: Will remain free from infection Outcome: Progressing Goal: Diagnostic test results will improve Outcome: Progressing Goal: Respiratory complications will improve Outcome: Progressing Goal: Cardiovascular complication will be avoided Outcome: Progressing   Problem: Activity: Goal: Risk for activity intolerance will decrease Outcome: Progressing   Problem: Nutrition: Goal: Adequate nutrition will be maintained Outcome: Progressing   Problem: Coping: Goal: Level of anxiety will decrease Outcome: Progressing   Problem: Elimination: Goal: Will not experience complications related to bowel motility Outcome: Progressing Goal: Will not experience complications related to urinary retention Outcome: Progressing   Problem: Pain Managment: Goal: General experience of comfort will improve Outcome: Progressing   Problem: Safety: Goal: Ability to remain free from injury will improve Outcome: Progressing   Problem: Skin Integrity: Goal: Risk for impaired skin integrity will decrease Outcome: Progressing   Problem: Clinical Measurements: Goal: Ability to avoid or minimize complications of infection will improve Outcome: Progressing   Problem: Skin Integrity: Goal: Skin integrity will improve Outcome: Progressing

## 2017-11-12 NOTE — Progress Notes (Signed)
Pt able to urinate on her own. Able to use bsc. Pt asking about home medications. Sent note to md . Will await orders.

## 2017-11-12 NOTE — Progress Notes (Signed)
Pt has not voided all night but pt claims she "felt it coming". Bladder scan done=372ml. Pt stated the need to void however external catheter canister is empty and brief is dry. Dr. Marcille Blanco paged and ordered for one time in and out catheterization. After explaining the significance of the procedure to pt, patient refused in and out as of this writing. Pt stated "I will never do that again! It was too painful." Pt would like to wait and to be given time to urinate on her own. Pt's external catheter checked, intact and in place.

## 2017-11-12 NOTE — Progress Notes (Signed)
Pt has not voided as of this writing. Pt agreed to do in and out catheter. This Probation officer and [place] tried twice, however failed. External catheter reapplied. Handed over to morning shift RN Luellen Pucker.

## 2017-11-12 NOTE — Evaluation (Addendum)
Physical Therapy Evaluation Patient Details Name: Heather Larson MRN: [id number] DOB: [birth year] Today's Date: 11/12/2017   History of Present Illness  84 y.o. female with b/l LE cellulitis with PMH atrial fibrillation, chronic hyponatremia, chronic lower extremity edema, hypertension presents from independent living facility after her apartment manager and friend and found out that she had fever.    Clinical Impression  Patient A&Ox4 at start of session, complaints of groin pain 7/10. Patient reports living in an independent living facility where she was independent with rollator, ADLs, self care, facility provided cooking/cleaning. States that she sees PT x1 a week currently. Patient demonstrates transfers with CGA and ambulates with RW, CGA ~82ft in room. The patient demonstrates decreased strength, endurance, activity tolerance, balance, and gait abnormalities. The patient would benefit from further skilled PT to address these limitations.     Follow Up Recommendations Home health PT;Supervision - Intermittent    Equipment Recommendations  None recommended by PT    Recommendations for Other Services       Precautions / Restrictions Precautions Precautions: Fall Restrictions Weight Bearing Restrictions: No      Mobility  Bed Mobility               General bed mobility comments: Deferred patient up in chair  Transfers Overall transfer level: Needs assistance   Transfers: Sit to/from Stand Sit to Stand: Min guard            Ambulation/Gait Ambulation/Gait assistance: Min guard Gait Distance (Feet): 32 Feet Assistive device: Rolling walker (2 wheeled)       General Gait Details: Patient demonstrated flexed trunk, decreased speed  Stairs            Wheelchair Mobility    Modified Rankin (Stroke Patients Only)       Balance Overall balance assessment: Needs assistance Sitting-balance support: Feet unsupported Sitting balance-Leahy Scale: Good        Standing balance-Leahy Scale: Poor                               Pertinent Vitals/Pain Pain Assessment: 0-10 Pain Score: 7  Pain Location: groin Pain Intervention(s): Repositioned    Home Living Family/patient expects to be discharged to:: Assisted living               Home Equipment: Walker - 4 wheels      Prior Function Level of Independence: Independent with assistive device(s)   Gait / Transfers Assistance Needed: Patient able to ambulate independently with rollator  ADL's / Homemaking Assistance Needed: facility provides cooking/cleaning        Hand Dominance   Dominant Hand: Right    Extremity/Trunk Assessment   Upper Extremity Assessment Upper Extremity Assessment: Generalized weakness;RUE deficits/detail;LUE deficits/detail RUE Deficits / Details: 3+/5 LUE Deficits / Details: 3+/5    Lower Extremity Assessment Lower Extremity Assessment: Generalized weakness;RLE deficits/detail;LLE deficits/detail RLE Deficits / Details: 3+/5 LLE Deficits / Details: 3+/5       Communication   Communication: No difficulties  Cognition Arousal/Alertness: Awake/alert Behavior During Therapy: WFL for tasks assessed/performed                                          General Comments      Exercises Other Exercises Other Exercises: Patient ambulated to bathroom, commode transfer with CGA, supervision for toileting/self care  needs   Assessment/Plan    PT Assessment Patient needs continued PT services  PT Problem List Decreased strength;Decreased range of motion;Decreased activity tolerance;Decreased balance;Decreased mobility;Decreased skin integrity       PT Treatment Interventions DME instruction;Therapeutic exercise;Gait training;Balance training;Neuromuscular re-education;Stair training;Functional mobility training;Therapeutic activities;Patient/family education    PT Goals (Current goals can be found in the Care Plan  section)  Acute Rehab PT Goals Patient Stated Goal: Patient would like to return home PT Goal Formulation: With patient Time For Goal Achievement: 11/26/17 Potential to Achieve Goals: Good    Frequency Min 2X/week   Barriers to discharge        Co-evaluation               AM-PAC PT "6 Clicks" Daily Activity  Outcome Measure Difficulty turning over in bed (including adjusting bedclothes, sheets and blankets)?: A Little Difficulty moving from lying on back to sitting on the side of the bed? : A Little Difficulty sitting down on and standing up from a chair with arms (e.g., wheelchair, bedside commode, etc,.)?: A Little Help needed moving to and from a bed to chair (including a wheelchair)?: A Little Help needed walking in hospital room?: A Little Help needed climbing 3-5 steps with a railing? : A Lot 6 Click Score: 17    End of Session Equipment Utilized During Treatment: Gait belt Activity Tolerance: Patient tolerated treatment well Patient left: with chair alarm set;in chair;with call bell/phone within reach;with SCD's reapplied Nurse Communication: Mobility status PT Visit Diagnosis: Other abnormalities of gait and mobility (R26.89);Unsteadiness on feet (R26.81);Muscle weakness (generalized) (M62.81)    Time: 8563-1497 PT Time Calculation (min) (ACUTE ONLY): 23 min   Charges:   PT Evaluation $PT Eval Low Complexity: 1 Low PT Treatments $Therapeutic Activity: 8-22 mins   PT G Codes:       Lieutenant Diego PT, DPT 10:56 AM,11/12/17 [phone]

## 2017-11-12 NOTE — Progress Notes (Signed)
[place] at [place] NAME: Heather Larson    MR#:  [id number]  DATE OF BIRTH:  [birth year]  SUBJECTIVE:  CHIEF COMPLAINT:  No chief complaint on file.  Erythema and swelling of both legs, erythema on lower abdomen. REVIEW OF SYSTEMS:  Review of Systems  Constitutional: Positive for chills and fever. Negative for malaise/fatigue.  HENT: Negative for sore throat.   Eyes: Negative for blurred vision and double vision.  Respiratory: Negative for cough, hemoptysis, shortness of breath, wheezing and stridor.   Cardiovascular: Positive for leg swelling. Negative for chest pain, palpitations and orthopnea.  Gastrointestinal: Negative for abdominal pain, blood in stool, diarrhea, melena, nausea and vomiting.  Genitourinary: Negative for dysuria, flank pain and hematuria.  Musculoskeletal: Negative for back pain and joint pain.  Skin: Negative for rash.       Erythema and swelling of both legs, erythema on lower abdomen.  Neurological: Negative for dizziness, sensory change, focal weakness, seizures, loss of consciousness, weakness and headaches.  Endo/Heme/Allergies: Negative for polydipsia.  Psychiatric/Behavioral: Negative for depression. The patient is not nervous/anxious.     DRUG ALLERGIES:  No Known Allergies VITALS:  Blood pressure (!) 117/53, pulse (!) 103, temperature 99.3 F (37.4 C), temperature source Oral, resp. rate 20, height 5\' 2"  (1.575 m), weight 118 lb (53.5 kg), SpO2 97 %. PHYSICAL EXAMINATION:  Physical Exam  Constitutional: She is oriented to person, place, and time. She appears well-developed.  HENT:  Head: Normocephalic.  Mouth/Throat: Oropharynx is clear and moist.  Eyes: Pupils are equal, round, and reactive to light. Conjunctivae and EOM are normal. No scleral icterus.  Neck: Normal range of motion. Neck supple. No JVD present. No tracheal deviation present.  Cardiovascular: Normal rate, regular rhythm and normal  heart sounds. Exam reveals no gallop.  No murmur heard. Pulmonary/Chest: Effort normal and breath sounds normal. No respiratory distress. She has no wheezes. She has no rales.  Abdominal: Soft. Bowel sounds are normal. She exhibits no distension. There is no tenderness. There is no rebound.  Musculoskeletal: Normal range of motion. She exhibits no edema or tenderness.  Neurological: She is alert and oriented to person, place, and time. No cranial nerve deficit.  Skin: No rash noted. No erythema.  Erythema and swelling of both legs, erythema on lower abdomen.  Psychiatric: She has a normal mood and affect.   LABORATORY PANEL:  Female CBC Recent Labs  Lab 11/12/17 0445  WBC 24.1*  HGB 10.3*  HCT 30.8*  PLT 296   ------------------------------------------------------------------------------------------------------------------ Chemistries  Recent Labs  Lab 11/11/17 1612 11/12/17 0445  NA 128* 126*  K 3.3* 3.7  CL 98 94*  CO2 20* 23  GLUCOSE 139* 117*  BUN 23 25*  CREATININE 0.93 0.76  CALCIUM 8.3* 8.3*  AST 127*  --   ALT 80*  --   ALKPHOS 94  --   BILITOT 0.6  --    RADIOLOGY:  Dg Chest Port 1 View  Result Date: 11/11/2017 CLINICAL DATA:  Shortness of breath. EXAM: PORTABLE CHEST 1 VIEW COMPARISON:  None. FINDINGS: The heart is borderline enlarged. Normal pulmonary vascularity. No focal consolidation, pleural effusion, or pneumothorax. No acute osseous abnormality. Old right ninth and tenth posterior rib fractures. IMPRESSION: No active disease. Electronically Signed   By: Titus Dubin M.D.   On: 11/11/2017 16:42   ASSESSMENT AND PLAN:  Heather Larson  is a 84 y.o. female with a known history of atrial fibrillation, chronic hyponatremia, chronic  lower extremity edema, hypertension presents from independent living facility after her apartment manager and friend and found out that she had fever.   * Sepsis due to B/L LE cellulitis and strep bacteremia, worsening  leukocytosis. The patient was treated with vancomycin and cefepime.   Blood culture show Streptococcus.  Change to Rocephin IV.  Follow-up CBC.  Lactic acidosis.  Improved with above treatment.  * pAfib.  Rapid ventricular rate due to sepsis.    Improved. Continue Toprol 50 mg and Xarelto.   * HTN Continue Toprol but hold other hypertension medication due to soft blood pressure.  * Chronic mild hyponatremia. Na 126.  Follow-up level.  Dehydration.  Hold diuretics, encourage oral intake and follow-up BMP.  Generalized weakness.  PT evaluation.  All the records are reviewed and case discussed with Care Management/Social Worker. Management plans discussed with the patient, her daughter and they are in agreement.  CODE STATUS: DNR  TOTAL TIME TAKING CARE OF THIS PATIENT: 42 minutes.   More than 50% of the time was spent in counseling/coordination of care: YES  POSSIBLE D/C IN 2-3 DAYS, DEPENDING ON CLINICAL CONDITION.   Demetrios Loll M.D on 11/12/2017 at 3:30 PM  Between 7am to 6pm - Pager - [phone]  After 6pm go to [email or web address] - [facility]

## 2017-11-13 LAB — CBC
HCT: 25.6 % — ABNORMAL LOW (ref 35.0–47.0)
Hemoglobin: 8.6 g/dL — ABNORMAL LOW (ref 12.0–16.0)
MCH: 26.7 pg (ref 26.0–34.0)
MCHC: 33.5 g/dL (ref 32.0–36.0)
MCV: 79.8 fL — ABNORMAL LOW (ref 80.0–100.0)
PLATELETS: 248 10*3/uL (ref 150–440)
RBC: 3.21 MIL/uL — ABNORMAL LOW (ref 3.80–5.20)
RDW: 15.7 % — AB (ref 11.5–14.5)
WBC: 19.4 10*3/uL — ABNORMAL HIGH (ref 3.6–11.0)

## 2017-11-13 LAB — BASIC METABOLIC PANEL
ANION GAP: 7 (ref 5–15)
BUN: 27 mg/dL — ABNORMAL HIGH (ref 8–23)
CALCIUM: 8 mg/dL — AB (ref 8.9–10.3)
CO2: 24 mmol/L (ref 22–32)
Chloride: 92 mmol/L — ABNORMAL LOW (ref 98–111)
Creatinine, Ser: 0.73 mg/dL (ref 0.44–1.00)
GLUCOSE: 118 mg/dL — AB (ref 70–99)
Potassium: 3.6 mmol/L (ref 3.5–5.1)
SODIUM: 123 mmol/L — AB (ref 135–145)

## 2017-11-13 MED ORDER — SODIUM CHLORIDE 1 G PO TABS
1.0000 g | ORAL_TABLET | Freq: Two times a day (BID) | ORAL | Status: DC
  Administered 2017-11-13 – 2017-11-15 (×4): 1 g via ORAL
  Filled 2017-11-13 (×5): qty 1

## 2017-11-13 MED ORDER — SODIUM CHLORIDE 0.9 % IV SOLN
INTRAVENOUS | Status: DC
  Administered 2017-11-13: 08:00:00 via INTRAVENOUS

## 2017-11-13 NOTE — Progress Notes (Signed)
[place] at [place] NAME: Heather Larson    MR#:  [id number]  DATE OF BIRTH:  August 05, 1923  SUBJECTIVE:  CHIEF COMPLAINT:  No chief complaint on file.  Better redness and swelling of both legs REVIEW OF SYSTEMS:  Review of Systems  Constitutional: Negative for chills, fever and malaise/fatigue.  HENT: Negative for sore throat.   Eyes: Negative for blurred vision and double vision.  Respiratory: Negative for cough, hemoptysis, shortness of breath, wheezing and stridor.   Cardiovascular: Positive for leg swelling. Negative for chest pain, palpitations and orthopnea.  Gastrointestinal: Negative for abdominal pain, blood in stool, diarrhea, melena, nausea and vomiting.  Genitourinary: Negative for dysuria, flank pain and hematuria.  Musculoskeletal: Negative for back pain and joint pain.  Skin: Negative for rash.       redness and swelling of both legs  Neurological: Negative for dizziness, sensory change, focal weakness, seizures, loss of consciousness, weakness and headaches.  Endo/Heme/Allergies: Negative for polydipsia.  Psychiatric/Behavioral: Negative for depression. The patient is not nervous/anxious.     DRUG ALLERGIES:  No Known Allergies VITALS:  Blood pressure 131/72, pulse (!) 108, temperature 98.2 F (36.8 C), temperature source Oral, resp. rate (!) 22, height 5\' 2"  (1.575 m), weight 120 lb (54.4 kg), SpO2 97 %. PHYSICAL EXAMINATION:  Physical Exam  Constitutional: She is oriented to person, place, and time. She appears well-developed.  HENT:  Head: Normocephalic.  Mouth/Throat: Oropharynx is clear and moist.  Eyes: Pupils are equal, round, and reactive to light. Conjunctivae and EOM are normal. No scleral icterus.  Neck: Normal range of motion. Neck supple. No JVD present. No tracheal deviation present.  Cardiovascular: Normal rate, regular rhythm and normal heart sounds. Exam reveals no gallop.  No murmur  heard. Pulmonary/Chest: Effort normal and breath sounds normal. No respiratory distress. She has no wheezes. She has no rales.  Abdominal: Soft. Bowel sounds are normal. She exhibits no distension. There is no tenderness. There is no rebound.  Musculoskeletal: Normal range of motion. She exhibits no edema or tenderness.  Neurological: She is alert and oriented to person, place, and time. No cranial nerve deficit.  Skin: No rash noted. No erythema.  Better eythema and swelling of both legs, much better erythema on lower abdomen.  Psychiatric: She has a normal mood and affect.  Vitals reviewed.  LABORATORY PANEL:  Female CBC Recent Labs  Lab 11/13/17 0319  WBC 19.4*  HGB 8.6*  HCT 25.6*  PLT 248   ------------------------------------------------------------------------------------------------------------------ Chemistries  Recent Labs  Lab 11/11/17 1612  11/13/17 0319  NA 128*   < > 123*  K 3.3*   < > 3.6  CL 98   < > 92*  CO2 20*   < > 24  GLUCOSE 139*   < > 118*  BUN 23   < > 27*  CREATININE 0.93   < > 0.73  CALCIUM 8.3*   < > 8.0*  AST 127*  --   --   ALT 80*  --   --   ALKPHOS 94  --   --   BILITOT 0.6  --   --    < > = values in this interval not displayed.   RADIOLOGY:  No results found. ASSESSMENT AND PLAN:  Heather Larson  is a 84 y.o. female with a known history of atrial fibrillation, chronic hyponatremia, chronic lower extremity edema, hypertension presents from independent living facility after her apartment manager and friend and  found out that she had fever.   * Sepsis due to B/L LE and lower abdominal wall cellulitis and strep bacteremia, Better lleukocytosis. The patient was treated with vancomycin and cefepime.   Blood culture show Streptococcus.  Changed to Rocephin IV.  Follow-up CBC.  Lactic acidosis.  Improved with above treatment.  * pAfib.  Rapid ventricular rate due to sepsis.    Improved. Continue Toprol 50 mg and Xarelto.   * HTN Continue  Toprol but hold other hypertension medication due to soft blood pressure.  * Hyponatremia. Na 123.  Normal saline IV, sodium tablets p.o. and follow-up sodium level.  Dehydration.  Hold diuretics, encourage oral intake, normal saline IV and follow-up BMP.  Generalized weakness.  PT evaluation: HHPT.  All the records are reviewed and case discussed with Care Management/Social Worker. Management plans discussed with the patient, her daughter and they are in agreement.  CODE STATUS: DNR  TOTAL TIME TAKING CARE OF THIS PATIENT: 37 minutes.   More than 50% of the time was spent in counseling/coordination of care: YES  POSSIBLE D/C IN 2 DAYS, DEPENDING ON CLINICAL CONDITION.   Demetrios Loll M.D on 11/13/2017 at 1:58 PM  Between 7am to 6pm - Pager - [phone]  After 6pm go to [email or web address] - [employer] [facility]

## 2017-11-13 NOTE — Care Management (Signed)
Met with patient and her daughter at bedside to discuss discharge planning. Patient lives at [facility]. She lives alone. Uses a walker. Patient has a aide that comes 2 x per week from an agency that she private pays for. She receives PT through [employer] at [facility]. Patient walks to the cafeteria for all her meals. She is independent with adls. Patient denies any needs at this time. RNCM provided her contact information should she have needs arise.

## 2017-11-14 ENCOUNTER — Other Ambulatory Visit: Payer: Medicare Other

## 2017-11-14 LAB — CBC
HCT: 26.7 % — ABNORMAL LOW (ref 35.0–47.0)
HEMOGLOBIN: 9 g/dL — AB (ref 12.0–16.0)
MCH: 26.7 pg (ref 26.0–34.0)
MCHC: 33.8 g/dL (ref 32.0–36.0)
MCV: 79 fL — ABNORMAL LOW (ref 80.0–100.0)
PLATELETS: 274 10*3/uL (ref 150–440)
RBC: 3.38 MIL/uL — ABNORMAL LOW (ref 3.80–5.20)
RDW: 15.4 % — ABNORMAL HIGH (ref 11.5–14.5)
WBC: 16.9 10*3/uL — AB (ref 3.6–11.0)

## 2017-11-14 LAB — BASIC METABOLIC PANEL
ANION GAP: 6 (ref 5–15)
BUN: 20 mg/dL (ref 8–23)
CALCIUM: 8.1 mg/dL — AB (ref 8.9–10.3)
CO2: 24 mmol/L (ref 22–32)
Chloride: 96 mmol/L — ABNORMAL LOW (ref 98–111)
Creatinine, Ser: 0.65 mg/dL (ref 0.44–1.00)
Glucose, Bld: 101 mg/dL — ABNORMAL HIGH (ref 70–99)
Potassium: 3.6 mmol/L (ref 3.5–5.1)
SODIUM: 126 mmol/L — AB (ref 135–145)

## 2017-11-14 LAB — CULTURE, BLOOD (ROUTINE X 2): SPECIAL REQUESTS: ADEQUATE

## 2017-11-14 MED ORDER — FUROSEMIDE 20 MG PO TABS
20.0000 mg | ORAL_TABLET | Freq: Once | ORAL | Status: AC
  Administered 2017-11-14: 20 mg via ORAL
  Filled 2017-11-14: qty 1

## 2017-11-14 MED ORDER — METOPROLOL SUCCINATE ER 50 MG PO TB24
75.0000 mg | ORAL_TABLET | Freq: Every day | ORAL | Status: DC
  Administered 2017-11-15 – 2017-11-19 (×5): 75 mg via ORAL
  Filled 2017-11-14 (×5): qty 1

## 2017-11-14 MED ORDER — DIPHENHYDRAMINE HCL 25 MG PO CAPS
25.0000 mg | ORAL_CAPSULE | Freq: Four times a day (QID) | ORAL | Status: DC | PRN
  Administered 2017-11-14 – 2017-11-25 (×11): 25 mg via ORAL
  Filled 2017-11-14 (×12): qty 1

## 2017-11-14 NOTE — Care Management Important Message (Signed)
Important Message  Patient Details  Name: Heather Larson MRN: [id number] Date of Birth: Aug 20, 1923   Medicare Important Message Given:  Yes    [name] A Yanni Ruberg 11/14/2017, 10:40 AM

## 2017-11-14 NOTE — Progress Notes (Signed)
Pt resting in bed with no acute distress. Family at bedside.

## 2017-11-14 NOTE — Progress Notes (Signed)
[facility] at [place] NAME: Heather Larson    MR#:  [id number]  DATE OF BIRTH:  [birth year]  SUBJECTIVE:  CHIEF COMPLAINT:  No chief complaint on file.  Better redness and swelling of both legs. Itch on feet after wearing socks. REVIEW OF SYSTEMS:  Review of Systems  Constitutional: Negative for chills, fever and malaise/fatigue.  HENT: Negative for sore throat.   Eyes: Negative for blurred vision and double vision.  Respiratory: Negative for cough, hemoptysis, shortness of breath, wheezing and stridor.   Cardiovascular: Positive for leg swelling. Negative for chest pain, palpitations and orthopnea.  Gastrointestinal: Negative for abdominal pain, blood in stool, diarrhea, melena, nausea and vomiting.  Genitourinary: Negative for dysuria, flank pain and hematuria.  Musculoskeletal: Negative for back pain and joint pain.  Skin: Positive for itching. Negative for rash.       redness and swelling of both legs  Neurological: Negative for dizziness, sensory change, focal weakness, seizures, loss of consciousness, weakness and headaches.  Endo/Heme/Allergies: Negative for polydipsia.  Psychiatric/Behavioral: Negative for depression. The patient is not nervous/anxious.     DRUG ALLERGIES:  No Known Allergies VITALS:  Blood pressure 140/78, pulse 76, temperature 98 F (36.7 C), temperature source Oral, resp. rate 18, height 5\' 2"  (1.575 m), weight 120 lb (54.4 kg), SpO2 99 %. PHYSICAL EXAMINATION:  Physical Exam  Constitutional: She is oriented to person, place, and time. She appears well-developed.  HENT:  Head: Normocephalic.  Mouth/Throat: Oropharynx is clear and moist.  Eyes: Pupils are equal, round, and reactive to light. Conjunctivae and EOM are normal. No scleral icterus.  Neck: Normal range of motion. Neck supple. No JVD present. No tracheal deviation present.  Cardiovascular: Normal rate, regular rhythm and normal heart sounds. Exam  reveals no gallop.  No murmur heard. Pulmonary/Chest: Effort normal and breath sounds normal. No respiratory distress. She has no wheezes. She has no rales.  Abdominal: Soft. Bowel sounds are normal. She exhibits no distension. There is no tenderness. There is no rebound.  Musculoskeletal: Normal range of motion. She exhibits edema. She exhibits no tenderness.  Neurological: She is alert and oriented to person, place, and time. No cranial nerve deficit.  Skin: No rash noted. No erythema.  Better eythema and swelling of both legs, much better erythema on lower abdomen.  Psychiatric: She has a normal mood and affect.  Vitals reviewed.  LABORATORY PANEL:  Female CBC Recent Labs  Lab 11/14/17 0421  WBC 16.9*  HGB 9.0*  HCT 26.7*  PLT 274   ------------------------------------------------------------------------------------------------------------------ Chemistries  Recent Labs  Lab 11/11/17 1612  11/14/17 0421  NA 128*   < > 126*  K 3.3*   < > 3.6  CL 98   < > 96*  CO2 20*   < > 24  GLUCOSE 139*   < > 101*  BUN 23   < > 20  CREATININE 0.93   < > 0.65  CALCIUM 8.3*   < > 8.1*  AST 127*  --   --   ALT 80*  --   --   ALKPHOS 94  --   --   BILITOT 0.6  --   --    < > = values in this interval not displayed.   RADIOLOGY:  No results found. ASSESSMENT AND PLAN:  Heather Larson  is a 84 y.o. female with a known history of atrial fibrillation, chronic hyponatremia, chronic lower extremity edema, hypertension presents from independent living  facility after her apartment manager and friend and found out that she had fever.   * Sepsis due to B/L LE and lower abdominal wall cellulitis and strep bacteremia, Better lleukocytosis. The patient was treated with vancomycin and cefepime.   Blood culture show Streptococcus.  Changed to Rocephin IV.  Follow-up CBC.  Lactic acidosis.  Improved with above treatment.  * pAfib.  Rapid ventricular rate due to sepsis.    Improved. Continue  Toprol 50 mg and Xarelto.   * HTN Increased Toprol but hold other hypertension medication.  * Hyponatremia.  Improved to baseline with normal saline IV, sodium tablets p.o. discontinued normal saline IV.  Dehydration.  Hold diuretics, improved with normal saline IV.  Generalized weakness.  PT evaluation: HHPT.  All the records are reviewed and case discussed with Care Management/Social Worker. Management plans discussed with the patient, her daughters and they are in agreement.  CODE STATUS: DNR  TOTAL TIME TAKING CARE OF THIS PATIENT: 38 minutes.   More than 50% of the time was spent in counseling/coordination of care: YES  POSSIBLE D/C IN 2 DAYS, DEPENDING ON CLINICAL CONDITION.   Demetrios Loll M.D on 11/14/2017 at 4:09 PM  Between 7am to 6pm - Pager - [phone]  After 6pm go to [email or web address] - [facility]

## 2017-11-14 NOTE — Progress Notes (Signed)
PT Cancellation Note  Patient Details Name: Heather Larson MRN: [id number] DOB: Oct 04, 1923   Cancelled Treatment:    Reason Eval/Treat Not Completed: Other (comment). Treatment attempt two times; each, pt was with other staff with additional staff waiting to see pt. Re attempt at a later time as the pt is available and the schedule allows.    Larae Grooms, PTA 11/14/2017, 12:18 PM

## 2017-11-15 LAB — CBC
HEMATOCRIT: 26.3 % — AB (ref 35.0–47.0)
Hemoglobin: 9 g/dL — ABNORMAL LOW (ref 12.0–16.0)
MCH: 27 pg (ref 26.0–34.0)
MCHC: 34.1 g/dL (ref 32.0–36.0)
MCV: 79.2 fL — AB (ref 80.0–100.0)
PLATELETS: 303 10*3/uL (ref 150–440)
RBC: 3.33 MIL/uL — ABNORMAL LOW (ref 3.80–5.20)
RDW: 15.3 % — AB (ref 11.5–14.5)
WBC: 17.7 10*3/uL — ABNORMAL HIGH (ref 3.6–11.0)

## 2017-11-15 MED ORDER — FUROSEMIDE 20 MG PO TABS
20.0000 mg | ORAL_TABLET | Freq: Every day | ORAL | Status: DC
  Administered 2017-11-15: 20 mg via ORAL
  Filled 2017-11-15: qty 1

## 2017-11-15 MED ORDER — FUROSEMIDE 20 MG PO TABS
20.0000 mg | ORAL_TABLET | Freq: Two times a day (BID) | ORAL | Status: DC
  Administered 2017-11-15 – 2017-11-22 (×12): 20 mg via ORAL
  Filled 2017-11-15 (×13): qty 1

## 2017-11-15 NOTE — Progress Notes (Signed)
[facility] at [place] NAME: Heather Larson    MR#:  [id number]  DATE OF BIRTH:  [birth year]  SUBJECTIVE:  CHIEF COMPLAINT:  No chief complaint on file.  Still bilateral leg swelling.  Cough this morning. REVIEW OF SYSTEMS:  Review of Systems  Constitutional: Negative for chills, fever and malaise/fatigue.  HENT: Negative for sore throat.   Eyes: Negative for blurred vision and double vision.  Respiratory: Positive for cough. Negative for hemoptysis, sputum production, shortness of breath, wheezing and stridor.   Cardiovascular: Positive for leg swelling. Negative for chest pain, palpitations and orthopnea.  Gastrointestinal: Negative for abdominal pain, blood in stool, diarrhea, melena, nausea and vomiting.  Genitourinary: Negative for dysuria, flank pain and hematuria.  Musculoskeletal: Negative for back pain and joint pain.  Skin: Negative for itching and rash.       redness and swelling of both legs  Neurological: Negative for dizziness, sensory change, focal weakness, seizures, loss of consciousness, weakness and headaches.  Endo/Heme/Allergies: Negative for polydipsia.  Psychiatric/Behavioral: Negative for depression. The patient is not nervous/anxious.     DRUG ALLERGIES:  No Known Allergies VITALS:  Blood pressure (!) 112/59, pulse 78, temperature 98.2 F (36.8 C), temperature source Oral, resp. rate (!) 22, height 5\' 2"  (1.575 m), weight 130 lb (59 kg), SpO2 98 %. PHYSICAL EXAMINATION:  Physical Exam  Constitutional: She is oriented to person, place, and time. She appears well-developed.  HENT:  Head: Normocephalic.  Mouth/Throat: Oropharynx is clear and moist.  Eyes: Pupils are equal, round, and reactive to light. Conjunctivae and EOM are normal. No scleral icterus.  Neck: Normal range of motion. Neck supple. No JVD present. No tracheal deviation present.  Cardiovascular: Normal rate, regular rhythm and normal heart sounds.  Exam reveals no gallop.  No murmur heard. Pulmonary/Chest: Effort normal and breath sounds normal. No respiratory distress. She has no wheezes. She has no rales.  Abdominal: Soft. Bowel sounds are normal. She exhibits no distension. There is no tenderness. There is no rebound.  Musculoskeletal: Normal range of motion. She exhibits edema. She exhibits no tenderness.  Neurological: She is alert and oriented to person, place, and time. No cranial nerve deficit.  Skin: No rash noted. No erythema.  Still erythema and swelling of both legs, no erythema on lower abdomen.  Psychiatric: She has a normal mood and affect.  Vitals reviewed.  LABORATORY PANEL:  Female CBC Recent Labs  Lab 11/15/17 0343  WBC 17.7*  HGB 9.0*  HCT 26.3*  PLT 303   ------------------------------------------------------------------------------------------------------------------ Chemistries  Recent Labs  Lab 11/11/17 1612  11/14/17 0421  NA 128*   < > 126*  K 3.3*   < > 3.6  CL 98   < > 96*  CO2 20*   < > 24  GLUCOSE 139*   < > 101*  BUN 23   < > 20  CREATININE 0.93   < > 0.65  CALCIUM 8.3*   < > 8.1*  AST 127*  --   --   ALT 80*  --   --   ALKPHOS 94  --   --   BILITOT 0.6  --   --    < > = values in this interval not displayed.   RADIOLOGY:  No results found. ASSESSMENT AND PLAN:  Heather Larson  is a 84 y.o. female with a known history of atrial fibrillation, chronic hyponatremia, chronic lower extremity edema, hypertension presents from independent living facility  after her apartment manager and friend and found out that she had fever.   * Sepsis due to B/L LE and lower abdominal wall cellulitis and strep bacteremia, The patient was treated with vancomycin and cefepime.   Blood culture show Streptococcus.  Changed to Rocephin IV. worsening leukocytosis. Follow-up CBC.  Lactic acidosis.  Improved with above treatment.  * pAfib.  Rapid ventricular rate due to sepsis.    Improved. Increase Toprol to  75 mg daily and continued Xarelto.  * HTN Increased Toprol, Lasix twice daily.  * Hyponatremia.  Improved to baseline with normal saline IV, sodium tablets p.o.   Dehydration.  improved with normal saline IV.  Resumed Lasix twice daily due to swelling of the leg and feet.  Generalized weakness.  PT evaluation: HHPT.  All the records are reviewed and case discussed with Care Management/Social Worker. Management plans discussed with the patient, her daughters and they are in agreement.  CODE STATUS: DNR  TOTAL TIME TAKING CARE OF THIS PATIENT: 40 minutes.   More than 50% of the time was spent in counseling/coordination of care: YES  POSSIBLE D/C IN 2 DAYS, DEPENDING ON CLINICAL CONDITION.   Demetrios Loll M.D on 11/15/2017 at 2:53 PM  Between 7am to 6pm - Pager - [phone]  After 6pm go to [email or web address] - [facility]

## 2017-11-15 NOTE — Care Management (Signed)
Patient will need order to resume outpatient PT at time of discharge. This may be included in discharge note or instructions.

## 2017-11-15 NOTE — Progress Notes (Signed)
Physical Therapy Treatment Patient Details Name: Heather Larson MRN: [id number] DOB: [birth year] Today's Date: 11/15/2017    History of Present Illness 84 y.o. female with b/l LE cellulitis with PMH atrial fibrillation, chronic hyponatremia, chronic lower extremity edema, hypertension presents from independent living facility after her apartment manager and friend and found out that she had fever.      PT Comments    Pt in bed ready for session.  Vitals before session 112/59 P 78. O2 98% on room air.  Pt was able to transition out of bed with min guard and ambulate x 2 around unit with on seated break with walker and min guard.  Overall tolerated well with HR 88 after gait.  Pt and family pleased with progress.  No LOB or buckling but general fatigue noted.   Follow Up Recommendations  Home health PT;Supervision - Intermittent     Equipment Recommendations       Recommendations for Other Services       Precautions / Restrictions Precautions Precautions: Fall Restrictions Weight Bearing Restrictions: No    Mobility  Bed Mobility Overal bed mobility: Needs Assistance Bed Mobility: Supine to Sit     Supine to sit: Supervision        Transfers Overall transfer level: Needs assistance Equipment used: Rolling walker (2 wheeled) Transfers: Sit to/from Stand Sit to Stand: Min guard            Ambulation/Gait Ambulation/Gait assistance: Min guard Gait Distance (Feet): 180 Feet Assistive device: Rolling walker (2 wheeled) Gait Pattern/deviations: Step-through pattern;Decreased step length - right;Decreased step length - left         Stairs             Wheelchair Mobility    Modified Rankin (Stroke Patients Only)       Balance Overall balance assessment: Needs assistance Sitting-balance support: Feet supported Sitting balance-Leahy Scale: Good     Standing balance support: Bilateral upper extremity supported Standing balance-Leahy Scale: Fair                               Cognition Arousal/Alertness: Awake/alert Behavior During Therapy: WFL for tasks assessed/performed                                          Exercises      General Comments        Pertinent Vitals/Pain Pain Assessment: No/denies pain    Home Living                      Prior Function            PT Goals (current goals can now be found in the care plan section) Progress towards PT goals: Progressing toward goals    Frequency    Min 2X/week      PT Plan Current plan remains appropriate    Co-evaluation              AM-PAC PT "6 Clicks" Daily Activity  Outcome Measure  Difficulty turning over in bed (including adjusting bedclothes, sheets and blankets)?: None Difficulty moving from lying on back to sitting on the side of the bed? : A Little Difficulty sitting down on and standing up from a chair with arms (e.g., wheelchair, bedside commode, etc,.)?: A Little Help needed moving to  and from a bed to chair (including a wheelchair)?: A Little Help needed walking in hospital room?: A Little Help needed climbing 3-5 steps with a railing? : A Lot 6 Click Score: 18    End of Session Equipment Utilized During Treatment: Gait belt Activity Tolerance: Patient tolerated treatment well Patient left: in chair;with chair alarm set;with call bell/phone within reach;with family/visitor present Nurse Communication: Other (comment)       Time: 4099-2780 PT Time Calculation (min) (ACUTE ONLY): 25 min  Charges:  $Gait Training: 8-22 mins $Therapeutic Activity: 8-22 mins                    G Codes:       Chesley Noon, PTA 11/15/17, 11:40 AM

## 2017-11-16 LAB — BASIC METABOLIC PANEL
Anion gap: 6 (ref 5–15)
BUN: 15 mg/dL (ref 8–23)
CALCIUM: 8.2 mg/dL — AB (ref 8.9–10.3)
CO2: 26 mmol/L (ref 22–32)
Chloride: 96 mmol/L — ABNORMAL LOW (ref 98–111)
Creatinine, Ser: 0.58 mg/dL (ref 0.44–1.00)
GFR calc Af Amer: >60 mL/min (ref >60)
GLUCOSE: 117 mg/dL — AB (ref 70–99)
POTASSIUM: 4 mmol/L (ref 3.5–5.1)
Sodium: 128 mmol/L — ABNORMAL LOW (ref 135–145)

## 2017-11-16 LAB — CBC
HCT: 25.7 % — ABNORMAL LOW (ref 35.0–47.0)
Hemoglobin: 8.7 g/dL — ABNORMAL LOW (ref 12.0–16.0)
MCH: 26.6 pg (ref 26.0–34.0)
MCHC: 33.9 g/dL (ref 32.0–36.0)
MCV: 78.5 fL — ABNORMAL LOW (ref 80.0–100.0)
PLATELETS: 326 10*3/uL (ref 150–440)
RBC: 3.27 MIL/uL — ABNORMAL LOW (ref 3.80–5.20)
RDW: 15.6 % — AB (ref 11.5–14.5)
WBC: 15.4 10*3/uL — ABNORMAL HIGH (ref 3.6–11.0)

## 2017-11-16 NOTE — Care Management (Signed)
Physical therapy has recommended home health physical therapy.  She may also benefit from home health nurse to follow cardiac status due to exacerbation of atrial fib resulting in medication changes and follow up assessment of cellulitis site. She would be able to transition back to her outpatient therapy with [facility] .

## 2017-11-16 NOTE — Care Management Important Message (Signed)
Important Message  Patient Details  Name: Heather Larson MRN: [id number] Date of Birth: 08/21/1923   Medicare Important Message Given:  Yes    [name] [name] 11/16/2017, 10:57 AM

## 2017-11-16 NOTE — Care Management (Signed)
Spoke with patient and her daughter and in agreement with discharge home with home health RN and PT.  Explained the transition back to outpatient therapy.  No agency preference.  Referral to [facility] and agency will see within 24 hours of discharge.  Anticipate discharge on Saturday and agency already has patient on the schedule for Sunday

## 2017-11-16 NOTE — Progress Notes (Signed)
[facility] at [place] NAME: Myranda Pavone    MR#:  [id number]  DATE OF BIRTH:  09-09-1923  SUBJECTIVE:  CHIEF COMPLAINT:  No chief complaint on file.  Still bilateral leg swelling.  Lethargy this morning REVIEW OF SYSTEMS:  Review of Systems  Constitutional: Positive for malaise/fatigue. Negative for chills and fever.  HENT: Negative for sore throat.   Eyes: Negative for blurred vision and double vision.  Respiratory: Negative for cough, hemoptysis, sputum production, shortness of breath, wheezing and stridor.   Cardiovascular: Positive for leg swelling. Negative for chest pain, palpitations and orthopnea.  Gastrointestinal: Negative for abdominal pain, blood in stool, diarrhea, melena, nausea and vomiting.  Genitourinary: Negative for dysuria, flank pain and hematuria.  Musculoskeletal: Negative for back pain and joint pain.  Skin: Negative for itching and rash.       redness and swelling of both legs  Neurological: Negative for dizziness, sensory change, focal weakness, seizures, loss of consciousness, weakness and headaches.  Endo/Heme/Allergies: Negative for polydipsia.  Psychiatric/Behavioral: Negative for depression. The patient is not nervous/anxious.     DRUG ALLERGIES:  No Known Allergies VITALS:  Blood pressure 136/70, pulse 98, temperature 99 F (37.2 C), temperature source Oral, resp. rate 19, height 5\' 2"  (1.575 m), weight 128 lb 12.8 oz (58.4 kg), SpO2 98 %. PHYSICAL EXAMINATION:  Physical Exam  Constitutional: She is oriented to person, place, and time. She appears well-developed.  HENT:  Head: Normocephalic.  Mouth/Throat: Oropharynx is clear and moist.  Eyes: Pupils are equal, round, and reactive to light. Conjunctivae and EOM are normal. No scleral icterus.  Neck: Normal range of motion. Neck supple. No JVD present. No tracheal deviation present.  Cardiovascular: Normal rate, regular rhythm and normal heart  sounds. Exam reveals no gallop.  No murmur heard. Pulmonary/Chest: Effort normal and breath sounds normal. No respiratory distress. She has no wheezes. She has no rales.  Abdominal: Soft. Bowel sounds are normal. She exhibits no distension. There is no tenderness. There is no rebound.  Musculoskeletal: Normal range of motion. She exhibits edema. She exhibits no tenderness.  Neurological: She is alert and oriented to person, place, and time. No cranial nerve deficit.  Skin: No rash noted. No erythema.  Still erythema and swelling of both legs, no erythema on lower abdomen.  Psychiatric: She has a normal mood and affect.  Vitals reviewed.  LABORATORY PANEL:  Female CBC Recent Labs  Lab 11/16/17 0328  WBC 15.4*  HGB 8.7*  HCT 25.7*  PLT 326   ------------------------------------------------------------------------------------------------------------------ Chemistries  Recent Labs  Lab 11/11/17 1612  11/16/17 0328  NA 128*   < > 128*  K 3.3*   < > 4.0  CL 98   < > 96*  CO2 20*   < > 26  GLUCOSE 139*   < > 117*  BUN 23   < > 15  CREATININE 0.93   < > 0.58  CALCIUM 8.3*   < > 8.2*  AST 127*  --   --   ALT 80*  --   --   ALKPHOS 94  --   --   BILITOT 0.6  --   --    < > = values in this interval not displayed.   RADIOLOGY:  No results found. ASSESSMENT AND PLAN:  Shaletha Humble  is a 84 y.o. female with a known history of atrial fibrillation, chronic hyponatremia, chronic lower extremity edema, hypertension presents from independent living facility  after her apartment manager and friend and found out that she had fever.   * Sepsis due to B/L LE and lower abdominal wall cellulitis and strep bacteremia, The patient was treated with vancomycin and cefepime.   Blood culture show Streptococcus.  Changed to Rocephin IV. Better leukocytosis. Follow-up CBC.  Lactic acidosis.  Improved with above treatment.  * pAfib.  Rapid ventricular rate due to sepsis.    Improved. Increased  Toprol to 75 mg daily and continued Xarelto.  * HTN Increased Toprol, Lasix twice daily.  * Hyponatremia.  Improved to baseline with normal saline IV, sodium tablets p.o.   Dehydration.  improved with normal saline IV.  Resumed Lasix twice daily due to swelling of the leg and feet.  Generalized weakness.  PT evaluation: HHPT.  All the records are reviewed and case discussed with Care Management/Social Worker. Management plans discussed with the patient, her daughters and they are in agreement.  CODE STATUS: DNR  TOTAL TIME TAKING CARE OF THIS PATIENT: 42 minutes.   More than 50% of the time was spent in counseling/coordination of care: YES  POSSIBLE D/C IN 2 DAYS, DEPENDING ON CLINICAL CONDITION.   Demetrios Loll M.D on 11/16/2017 at 1:36 PM  Between 7am to 6pm - Pager - [phone]  After 6pm go to [email or web address] - [facility]

## 2017-11-16 NOTE — Progress Notes (Signed)
Physical Therapy Treatment Patient Details Name: Madalynn Pickelsimer MRN: [id number] DOB: Oct 04, 1923 Today's Date: 11/16/2017    History of Present Illness 84 y.o. female with b/l LE cellulitis with PMH atrial fibrillation, chronic hyponatremia, chronic lower extremity edema, hypertension presents from independent living facility after her apartment manager and friend and found out that she had fever.      PT Comments    Pt reported being fatigued but willing to walk.  Ambulated x 2 around unit then stopped in bathroom to void before returning to chair.  No LOB noted.    Follow Up Recommendations  Home health PT;Supervision - Intermittent     Equipment Recommendations  None recommended by PT    Recommendations for Other Services       Precautions / Restrictions Precautions Precautions: Fall Restrictions Weight Bearing Restrictions: No    Mobility  Bed Mobility               General bed mobility comments: Deferred patient up in chair  Transfers Overall transfer level: Needs assistance Equipment used: Rolling walker (2 wheeled) Transfers: Sit to/from Stand Sit to Stand: Min guard            Ambulation/Gait Ambulation/Gait assistance: Min guard Gait Distance (Feet): 360 Feet Assistive device: Rolling walker (2 wheeled) Gait Pattern/deviations: Step-through pattern;Decreased step length - right;Decreased step length - left;Trunk flexed Gait velocity: decreased   General Gait Details: Patient demonstrated flexed trunk, decreased speed   Stairs             Wheelchair Mobility    Modified Rankin (Stroke Patients Only)       Balance Overall balance assessment: Needs assistance Sitting-balance support: Feet supported Sitting balance-Leahy Scale: Good     Standing balance support: Bilateral upper extremity supported Standing balance-Leahy Scale: Fair                              Cognition Arousal/Alertness: Awake/alert Behavior  During Therapy: WFL for tasks assessed/performed Overall Cognitive Status: Within Functional Limits for tasks assessed                                        Exercises Other Exercises Other Exercises: Patient ambulated to bathroom, commode transfer with CGA, supervision for toileting/self care needs    General Comments        Pertinent Vitals/Pain Pain Assessment: No/denies pain    Home Living                      Prior Function            PT Goals (current goals can now be found in the care plan section) Progress towards PT goals: Progressing toward goals    Frequency    Min 2X/week      PT Plan Current plan remains appropriate    Co-evaluation              AM-PAC PT "6 Clicks" Daily Activity  Outcome Measure  Difficulty turning over in bed (including adjusting bedclothes, sheets and blankets)?: None Difficulty moving from lying on back to sitting on the side of the bed? : A Little Difficulty sitting down on and standing up from a chair with arms (e.g., wheelchair, bedside commode, etc,.)?: A Little Help needed moving to and from a bed to chair (including a wheelchair)?:  A Little Help needed walking in hospital room?: A Little Help needed climbing 3-5 steps with a railing? : A Lot 6 Click Score: 18    End of Session Equipment Utilized During Treatment: Gait belt Activity Tolerance: Patient tolerated treatment well           Time: 1135-1200 PT Time Calculation (min) (ACUTE ONLY): 25 min  Charges:  $Gait Training: 8-22 mins $Therapeutic Activity: 8-22 mins                    G Codes:       Chesley Noon, PTA 11/16/17, 12:07 PM

## 2017-11-17 LAB — CBC
HCT: 26.3 % — ABNORMAL LOW (ref 35.0–47.0)
Hemoglobin: 8.8 g/dL — ABNORMAL LOW (ref 12.0–16.0)
MCH: 26.4 pg (ref 26.0–34.0)
MCHC: 33.4 g/dL (ref 32.0–36.0)
MCV: 79 fL — ABNORMAL LOW (ref 80.0–100.0)
PLATELETS: 391 10*3/uL (ref 150–440)
RBC: 3.33 MIL/uL — AB (ref 3.80–5.20)
RDW: 15.6 % — ABNORMAL HIGH (ref 11.5–14.5)
WBC: 21.6 10*3/uL — AB (ref 3.6–11.0)

## 2017-11-17 LAB — BASIC METABOLIC PANEL
ANION GAP: 8 (ref 5–15)
BUN: 17 mg/dL (ref 8–23)
CALCIUM: 8.2 mg/dL — AB (ref 8.9–10.3)
CO2: 26 mmol/L (ref 22–32)
Chloride: 95 mmol/L — ABNORMAL LOW (ref 98–111)
Creatinine, Ser: 0.73 mg/dL (ref 0.44–1.00)
GLUCOSE: 98 mg/dL (ref 70–99)
POTASSIUM: 3.9 mmol/L (ref 3.5–5.1)
SODIUM: 129 mmol/L — AB (ref 135–145)

## 2017-11-17 MED ORDER — LEVOFLOXACIN 500 MG PO TABS: 500.0000 mg | ORAL_TABLET | Freq: Every day | ORAL | 0 refills | Status: DC

## 2017-11-17 MED ORDER — LEVOFLOXACIN IN D5W 500 MG/100ML IV SOLN
500.0000 mg | INTRAVENOUS | Status: DC
  Filled 2017-11-17: qty 100

## 2017-11-17 MED ORDER — CEPHALEXIN 500 MG PO CAPS: 500.0000 mg | ORAL_CAPSULE | Freq: Two times a day (BID) | ORAL | 0 refills | Status: DC

## 2017-11-17 MED ORDER — METOPROLOL SUCCINATE ER 25 MG PO TB24: 75.0000 mg | ORAL_TABLET | Freq: Every day | ORAL | 0 refills | Status: DC

## 2017-11-17 MED ORDER — LEVOFLOXACIN IN D5W 500 MG/100ML IV SOLN
500.0000 mg | INTRAVENOUS | Status: DC
  Administered 2017-11-17 – 2017-11-18 (×2): 500 mg via INTRAVENOUS
  Filled 2017-11-17 (×3): qty 100

## 2017-11-17 NOTE — Progress Notes (Signed)
[facility] [place] NAME: Heather Larson    MR#:  [id number]  DATE OF BIRTH:  [birth year]  SUBJECTIVE:  noFever.  CHIEF COMPLAINT:  No chief complaint on file.  Still bilateral leg swelling.  REVIEW OF SYSTEMS:  Review of Systems  Constitutional: Positive for malaise/fatigue. Negative for chills and fever.  HENT: Negative for sore throat.   Eyes: Negative for blurred vision and double vision.  Respiratory: Negative for cough, hemoptysis, sputum production, shortness of breath, wheezing and stridor.   Cardiovascular: Positive for leg swelling. Negative for chest pain, palpitations and orthopnea.  Gastrointestinal: Negative for abdominal pain, blood in stool, diarrhea, melena, nausea and vomiting.  Genitourinary: Negative for dysuria, flank pain and hematuria.  Musculoskeletal: Negative for back pain and joint pain.  Skin: Negative for itching and rash.       redness and swelling of both legs  Neurological: Negative for dizziness, sensory change, focal weakness, seizures, loss of consciousness, weakness and headaches.  Endo/Heme/Allergies: Negative for polydipsia.  Psychiatric/Behavioral: Negative for depression. The patient is not nervous/anxious.     DRUG ALLERGIES:  No Known Allergies VITALS:  Blood pressure 139/82, pulse (!) 122, temperature 98.5 F (36.9 C), temperature source Oral, resp. rate 18, height 5\' 2"  (1.575 m), weight 58.4 kg (128 lb 12.8 oz), SpO2 97 %. PHYSICAL EXAMINATION:  Physical Exam  Constitutional: She is oriented to person, place, and time. She appears well-developed.  HENT:  Head: Normocephalic.  Mouth/Throat: Oropharynx is clear and moist.  Eyes: Pupils are equal, round, and reactive to light. Conjunctivae and EOM are normal. No scleral icterus.  Neck: Normal range of motion. Neck supple. No JVD present. No tracheal deviation present.  Cardiovascular: Normal rate, regular rhythm and normal heart sounds.  Exam reveals no gallop.  No murmur heard. Pulmonary/Chest: Effort normal and breath sounds normal. No respiratory distress. She has no wheezes. She has no rales.  Abdominal: Soft. Bowel sounds are normal. She exhibits no distension. There is no tenderness. There is no rebound.  Musculoskeletal: Normal range of motion. She exhibits edema. She exhibits no tenderness.  Neurological: She is alert and oriented to person, place, and time. No cranial nerve deficit.  Skin: No rash noted. No erythema.  Still erythema and swelling of both legs, no erythema on lower abdomen.  Psychiatric: She has a normal mood and affect.  Vitals reviewed.  LABORATORY PANEL:  Female CBC Recent Labs  Lab 11/17/17 0449  WBC 21.6*  HGB 8.8*  HCT 26.3*  PLT 391   ------------------------------------------------------------------------------------------------------------------ Chemistries  Recent Labs  Lab 11/11/17 1612  11/17/17 0449  NA 128*   < > 129*  K 3.3*   < > 3.9  CL 98   < > 95*  CO2 20*   < > 26  GLUCOSE 139*   < > 98  BUN 23   < > 17  CREATININE 0.93   < > 0.73  CALCIUM 8.3*   < > 8.2*  AST 127*  --   --   ALT 80*  --   --   ALKPHOS 94  --   --   BILITOT 0.6  --   --    < > = values in this interval not displayed.   RADIOLOGY:  No results found. ASSESSMENT AND PLAN:  Heather Larson  is a 84 y.o. female with a known history of atrial fibrillation, chronic hyponatremia, chronic lower extremity edema, hypertension presents from independent living facility  after her apartment manager and friend and found out that she had fever.   * Sepsis due to B/L LE and lower abdominal wall cellulitis and strep bacteremia, The patient was treated with vancomycin and cefepime.   Blood culture show Streptococcus.  Changed from Rocephin to Levaquin.  Discharge canceled, daughter requested ID physician consult, patient ID physician from [facility] and waiting for them to call me back started back on Levaquin,  check CBC again, followed up with blood cultures for follow-up of bacteremia, discharge is canceled. Lactic acidosis.  Improved with above treatment.  * pAfib.  Rapid ventricular rate due to sepsis.    Improved. Increased Toprol to 75 mg daily and continued Xarelto.  * HTN Increased Toprol, Lasix twice daily.  * Hyponatremia.  Improved to baseline with normal saline IV, sodium tablets p.o. patient does have chronic hyponatremia but sodium is improved to 129.  Dehydration.  improved with normal saline IV.   esume Bumex at discharge Generalized weakness.  PT evaluation: HHPT.  All the records are reviewed and case discussed with Care Management/Social Worker. Management plans discussed with the patient, her daughters and they are in agreement.  CODE STATUS: DNR  TOTAL TIME TAKING CARE OF THIS PATIENT: 42 minutes.   More than 50% of the time was spent in counseling/coordination of care: YES  POSSIBLE D/C IN 2 DAYS, DEPENDING ON CLINICAL CONDITION.   Epifanio Lesches M.D on 11/17/2017 at 1:33 PM  Between 7am to 6pm - Pager - [phone]  After 6pm go to [email or web address] - [facility]

## 2017-11-17 NOTE — Plan of Care (Signed)
  Problem: Education: Goal: Knowledge of General Education information will improve Outcome: Progressing   Problem: Health Behavior/Discharge Planning: Goal: Ability to manage health-related needs will improve Outcome: Progressing   Problem: Clinical Measurements: Goal: Ability to maintain clinical measurements within normal limits will improve Outcome: Progressing Goal: Will remain free from infection Outcome: Progressing Goal: Diagnostic test results will improve Outcome: Progressing Goal: Respiratory complications will improve Outcome: Progressing Goal: Cardiovascular complication will be avoided Outcome: Progressing   Problem: Activity: Goal: Risk for activity intolerance will decrease Outcome: Progressing   Problem: Nutrition: Goal: Adequate nutrition will be maintained Outcome: Progressing   Problem: Elimination: Goal: Will not experience complications related to bowel motility Outcome: Progressing Goal: Will not experience complications related to urinary retention Outcome: Progressing   Problem: Pain Managment: Goal: General experience of comfort will improve Outcome: Progressing   Problem: Safety: Goal: Ability to remain free from injury will improve Outcome: Progressing   Problem: Skin Integrity: Goal: Risk for impaired skin integrity will decrease Outcome: Progressing   Problem: Clinical Measurements: Goal: Ability to avoid or minimize complications of infection will improve Outcome: Progressing   Problem: Skin Integrity: Goal: Skin integrity will improve Outcome: Progressing

## 2017-11-17 NOTE — Discharge Summary (Signed)
Heather Larson, is a 84 y.o. female  DOB 1923-08-24  MRN [id number].  Admission date:  11/11/2017  Admitting Physician  Hillary Bow, MD  Discharge Date:  11/17/2017   Primary MD  Pleas Koch, NP  Recommendations for primary care physician for things to follow:   Follow-up with PCP in 3 to 4 days regarding follow-up of blood culture and also repeat CBC, BMP.   Admission Diagnosis  Cellulitis of abdominal wall [L03.311] Hypokalemia [E87.6] Hyponatremia [E87.1] Chronic atrial fibrillation (HCC) [I48.2] Elevated LFTs [R94.5] Elevated brain natriuretic peptide (BNP) level [R79.89] Cellulitis of lower extremity, unspecified laterality [L03.119] Sepsis, due to unspecified organism [facility]) [A41.9]   Discharge Diagnosis  Cellulitis of abdominal wall [L03.311] Hypokalemia [E87.6] Hyponatremia [E87.1] Chronic atrial fibrillation (HCC) [I48.2] Elevated LFTs [R94.5] Elevated brain natriuretic peptide (BNP) level [R79.89] Cellulitis of lower extremity, unspecified laterality [L03.119] Sepsis, due to unspecified organism ([place]) [A41.9]   Active Problems:   Bilateral lower leg cellulitis      Past Medical History:  Diagnosis Date  . A-fib ([place])   . Bilateral lower extremity edema   . Cancer [facility])    breast cancer at age 49 and skin  . Cellulitis    lower abdomen  . Hyperlipidemia   . Hypertension   . Hypertension   . Incontinence   . Low sodium levels   . Macular degeneration     Past Surgical History:  Procedure Laterality Date  . BREAST SURGERY     right mastectomy  . EYE SURGERY     x3  . IR RADIOLOGIST EVAL & MGMT  09/17/2017  . MASTECTOMY     right  . SHOULDER OPEN ROTATOR CUFF REPAIR     left  . SKIN BIOPSY     skin cancer       History of present illness and  Hospital Course:     Kindly  see H&P for history of present illness and admission details, please review complete Labs, Consult reports and Test reports for all details in brief  HPI  from the history and physical done on the day of admission 84 year old female patient with history of chronic atrial fibrillation, chronic hyponatremia comes from [facility] independent facility for some nausea, generalized weakness, found to have bilateral leg cellulitis with sepsis and admitted for the same.  Hospital Course  Sepsis secondary to bilateral leg cellulitis history of bacteremia: Patient received a vancomycin, cefepime blood cultures showed Streptococcus and antibiotics are changed to Rocephin.  Patient insisted that she be discharged home today.  WBC is around 21.  Patient is afebrile, she really wanted to go home.  I told her that we repeat blood cultures today, recommended that she follows with her physician next 3 to 4 days for follow-up of results of blood cultures and also CBC, BMP.  She said that she is under a lot of stress that she moved from [place] and she still has stuff in [place] and she feels that she wants to go today.  Discussed about lab results.  Child has strep pyogenes bacteremia sensitive to Levaquin, vancomycin, azithromycin, discharged home with Levaquin 500 mg daily for 7 days.  #2. chronic hyponatremia,; patient sodium improved, it was 123 few days ago and now 129 today, patient says that she has chronic hyponatremia,, initially received gentle hydration due to sepsis.  Patient had echo in April of this year showed EF 55 to 60%.  Follows up with Dr. Rockey Situ.  3.Rapid ventricular rate due to  sepsis.   Improved. Increased Toprol to 75 mg daily and continued Xarelto.  Chronic leg edema,; patient is on Bumex.  Generalized weakness; sickle therapy recommended home health physical therapy.   Discharge Condition: Stable   Follow UP      Discharge Instructions  and  Discharge Medications       Allergies as of 11/17/2017   No Known Allergies     Medication List    TAKE these medications   atorvastatin 10 MG tablet Commonly known as:  LIPITOR Take 1 tablet (10 mg total) by mouth daily.   brimonidine 0.1 % Soln Commonly known as:  ALPHAGAN P Place 1 drop into both eyes 2 (two) times daily.   bumetanide 1 MG tablet Commonly known as:  BUMEX Take 1 tablet (1 mg total) by mouth daily as needed. Patient takes 1/2 tablet daily.   cephALEXin 500 MG capsule Commonly known as:  KEFLEX Take 1 capsule (500 mg total) by mouth 2 (two) times daily for 10 days.   docusate sodium 100 MG capsule Commonly known as:  COLACE Take 1 capsule (100 mg total) by mouth daily.   hydrALAZINE 50 MG tablet Commonly known as:  APRESOLINE Take 1 tablet (50 mg total) by mouth 3 (three) times daily.   meloxicam 7.5 MG tablet Commonly known as:  MOBIC Take 7.5 mg by mouth daily.   metoprolol succinate 25 MG 24 hr tablet Commonly known as:  TOPROL-XL Take 3 tablets (75 mg total) by mouth daily. What changed:    medication strength  how much to take  additional instructions   MULTIVITAMIN ADULT Tabs Take 1 tablet by mouth daily.   Rivaroxaban 15 MG Tabs tablet Commonly known as:  XARELTO Take 1 tablet (15 mg total) by mouth daily.   travoprost (benzalkonium) 0.004 % ophthalmic solution Commonly known as:  TRAVATAN Place 1 drop into both eyes at bedtime.   valsartan-hydrochlorothiazide 160-25 MG tablet Commonly known as:  DIOVAN-HCT Take 1 tablet by mouth daily.   vitamin C 500 MG tablet Commonly known as:  ASCORBIC ACID Take 500 mg by mouth daily.         Diet and Activity recommendation: See Discharge Instructions above   Consults obtained -physical therapy   Major procedures and Radiology Reports - PLEASE review detailed and final reports for all details, in brief -      Dg Chest Port 1 View  Result Date: 11/11/2017 CLINICAL DATA:  Shortness of  breath. EXAM: PORTABLE CHEST 1 VIEW COMPARISON:  None. FINDINGS: The heart is borderline enlarged. Normal pulmonary vascularity. No focal consolidation, pleural effusion, or pneumothorax. No acute osseous abnormality. Old right ninth and tenth posterior rib fractures. IMPRESSION: No active disease. Electronically Signed   By: Titus Dubin M.D.   On: 11/11/2017 16:42    Micro Results    Recent Results (from the past 240 hour(s))  Blood Culture (routine x 2)     Status: Abnormal   Collection Time: 11/11/17  4:12 PM  Result Value Ref Range Status   Specimen Description   Final    BLOOD BLOOD RIGHT WRIST Performed at [facility], [address], [place], [place] [address]    Special Requests   Final    BOTTLES DRAWN AEROBIC AND ANAEROBIC Blood Culture adequate volume Performed at [facility], [address], [place], [place] [address]    Culture  Setup Time   Final    GRAM POSITIVE COCCI IN BOTH AEROBIC AND ANAEROBIC BOTTLES  CRITICAL VALUE NOTED.  VALUE IS CONSISTENT WITH PREVIOUSLY REPORTED AND CALLED VALUE. Performed at [facility], [address] [place], [place] [address]    Culture (A)  Final    STREPTOCOCCUS GROUP G SUSCEPTIBILITIES PERFORMED ON PREVIOUS CULTURE WITHIN THE LAST 5 DAYS. Performed at [facility], [place] [address], [place], [place] [address]    Report Status 11/14/2017 FINAL  Final  Blood Culture (routine x 2)     Status: Abnormal   Collection Time: 11/11/17  4:19 PM  Result Value Ref Range Status   Specimen Description   Final    BLOOD R F A Performed at [facility], [address], [place], [place] [address]    Special Requests   Final    BOTTLES DRAWN AEROBIC AND ANAEROBIC Blood Culture adequate volume Performed at [facility], [address] [place], [place] [address]    Culture  Setup Time   Final    GRAM POSITIVE COCCI IN BOTH AEROBIC AND ANAEROBIC BOTTLES CRITICAL RESULT CALLED TO, READ  BACK BY AND VERIFIED WITH: JASON ROBBINS AT 0456 ON 11/12/17 [place]. Performed at [facility], [place] [address] [place], [place] [address]    Culture STREPTOCOCCUS PYOGENES (A)  Final   Report Status 11/14/2017 FINAL  Final   Organism ID, Bacteria STREPTOCOCCUS PYOGENES  Final      Susceptibility   Streptococcus pyogenes - MIC*    ERYTHROMYCIN <=0.12 SENSITIVE Sensitive     LEVOFLOXACIN 0.5 SENSITIVE Sensitive     VANCOMYCIN 0.5 SENSITIVE Sensitive     * STREPTOCOCCUS PYOGENES  Urine culture     Status: None   Collection Time: 11/11/17  4:19 PM  Result Value Ref Range Status   Specimen Description   Final    URINE, RANDOM Performed at [facility], [address], [place], [place] [address]    Special Requests   Final    NONE Performed at [facility], [address] [place], [place] [address]    Culture   Final    NO GROWTH Performed at [facility], [place] [address], [place],  [address]    Report Status 11/12/2017 FINAL  Final  Blood Culture ID Panel (Reflexed)     Status: Abnormal   Collection Time: 11/11/17  4:19 PM  Result Value Ref Range Status   Enterococcus species NOT DETECTED NOT DETECTED Final   Listeria monocytogenes NOT DETECTED NOT DETECTED Final   Staphylococcus species NOT DETECTED NOT DETECTED Final   Staphylococcus aureus NOT DETECTED NOT DETECTED Final   Streptococcus species DETECTED (A) NOT DETECTED Final    Comment: Not Enterococcus species, Streptococcus agalactiae, Streptococcus pyogenes, or Streptococcus pneumoniae. CRITICAL RESULT CALLED TO, READ BACK BY AND VERIFIED WITH: JASON ROBBINS AT 5573 11/12/17 [place]    Streptococcus agalactiae NOT DETECTED NOT DETECTED Final   Streptococcus pneumoniae NOT DETECTED NOT DETECTED Final   Streptococcus pyogenes NOT DETECTED NOT DETECTED Final   Acinetobacter baumannii NOT DETECTED NOT DETECTED Final   Enterobacteriaceae species NOT DETECTED NOT DETECTED Final   Enterobacter  cloacae complex NOT DETECTED NOT DETECTED Final   Escherichia coli NOT DETECTED NOT DETECTED Final   Klebsiella oxytoca NOT DETECTED NOT DETECTED Final   Klebsiella pneumoniae NOT DETECTED NOT DETECTED Final   Proteus species NOT DETECTED NOT DETECTED Final   Serratia marcescens NOT DETECTED NOT DETECTED Final   Haemophilus influenzae NOT DETECTED NOT DETECTED Final   Neisseria meningitidis NOT DETECTED NOT DETECTED  Final   Pseudomonas aeruginosa NOT DETECTED NOT DETECTED Final   Candida albicans NOT DETECTED NOT DETECTED Final   Candida glabrata NOT DETECTED NOT DETECTED Final   Candida krusei NOT DETECTED NOT DETECTED Final   Candida parapsilosis NOT DETECTED NOT DETECTED Final   Candida tropicalis NOT DETECTED NOT DETECTED Final    Comment: Performed at [facility], [address] [place], [place] [address]       Today   Subjective:   Somara Frymire today has no headache,no chest abdominal pain,no new weakness tingling or numbness, feels much better wants to go home today.  Objective:   Blood pressure 139/82, pulse (!) 122, temperature 98.5 F (36.9 C), temperature source Oral, resp. rate 18, height 5\' 2"  (1.575 m), weight 58.4 kg (128 lb 12.8 oz), SpO2 97 %.   Intake/Output Summary (Last 24 hours) at 11/17/2017 0847 Last data filed at 11/17/2017 0932 Gross per 24 hour  Intake 580 ml  Output 1 ml  Net 579 ml    Exam Awake Alert, Oriented x 3, No new F.N deficits, Normal affect [place].AT,PERRAL Supple Neck,No JVD, No cervical lymphadenopathy appriciated.  Symmetrical Chest wall movement, Good air movement bilaterally, CTAB RRR,No Gallops,Rubs or new Murmurs, No Parasternal Heave +ve B.Sounds, Abd Soft, Non tender, No organomegaly appriciated, No rebound -guarding or rigidity. No Cyanosis, edema of the both legs and she is chronically swollen and she thinks   She had  lymph node removal when she was at [facility] long time ago. Data Review   CBC w Diff:  Lab  Results  Component Value Date   WBC 21.6 (H) 11/17/2017   HGB 8.8 (L) 11/17/2017   HCT 26.3 (L) 11/17/2017   PLT 391 11/17/2017   LYMPHOPCT 2 11/11/2017   MONOPCT 2 11/11/2017   EOSPCT 0 11/11/2017   BASOPCT 0 11/11/2017    CMP:  Lab Results  Component Value Date   NA 129 (L) 11/17/2017   K 3.9 11/17/2017   CL 95 (L) 11/17/2017   CO2 26 11/17/2017   BUN 17 11/17/2017   CREATININE 0.73 11/17/2017   PROT 5.7 (L) 11/11/2017   ALBUMIN 3.1 (L) 11/11/2017   BILITOT 0.6 11/11/2017   ALKPHOS 94 11/11/2017   AST 127 (H) 11/11/2017   ALT 80 (H) 11/11/2017  .   Total Time in preparing paper work, data evaluation and todays exam - 35 minutes  Epifanio Lesches M.D on 11/17/2017 at 8:47 AM    Note: This dictation was prepared with Dragon dictation along with smaller phrase technology. Any transcriptional errors that result from this process are unintentional.

## 2017-11-17 NOTE — Care Management Note (Signed)
Case Management Note  Patient Details  Name: Heather Larson MRN: [id number] Date of Birth: [birth year]  Subjective/Objective:    Patient to be discharged per MD order. Patient with orders in place for home health services. Previous RNCM has established set up with [facility]. Spoke with Malachy Mood from [place] who confirmed referral for services. Patient lives at [facility] retirment community, family to provide transport.                 Action/Plan:   Expected Discharge Date:  11/17/17               Expected Discharge Plan:  [place]  In-House Referral:     Discharge planning Services  CM Consult  Post Acute Care Choice:  Home Health Choice offered to:  Adult Children, Patient  DME Arranged:    DME Agency:     HH Arranged:  RN, PT HH Agency:  [place]  Status of Service:  Completed, signed off  If discussed at [place] of Stay Meetings, dates discussed:    Additional Comments:  Havish Petties A Remona Boom, RN 11/17/2017, 10:10 AM

## 2017-11-17 NOTE — Progress Notes (Signed)
Discussed with patient's daughter, we will cancel the discharge due persistent increase in white count, strep pyogenes bacteremia, daughter requested ID consult.  Blood cultures are ordered, CBC also is ordered.

## 2017-11-18 ENCOUNTER — Inpatient Hospital Stay: Payer: Medicare Other

## 2017-11-18 LAB — CBC
HCT: 24.9 % — ABNORMAL LOW (ref 35.0–47.0)
HEMOGLOBIN: 8.4 g/dL — AB (ref 12.0–16.0)
MCH: 26.4 pg (ref 26.0–34.0)
MCHC: 33.9 g/dL (ref 32.0–36.0)
MCV: 77.9 fL — ABNORMAL LOW (ref 80.0–100.0)
PLATELETS: 454 10*3/uL — AB (ref 150–440)
RBC: 3.19 MIL/uL — AB (ref 3.80–5.20)
RDW: 15.7 % — ABNORMAL HIGH (ref 11.5–14.5)
WBC: 24 10*3/uL — ABNORMAL HIGH (ref 3.6–11.0)

## 2017-11-18 NOTE — Progress Notes (Signed)
I spoke with Dr. Bernita Raisin who is covering ID physician from [facility] And he recommended to continue Levaquin without adding any other IV medicines at this time, recommended the watch.

## 2017-11-18 NOTE — Progress Notes (Signed)
Patient sitting up in chair, legs are less tender today but still sensitive. No complaints of pain. Call bell in reach. Continue to monitor.

## 2017-11-18 NOTE — Plan of Care (Signed)
  Problem: Education: Goal: Knowledge of General Education information will improve Outcome: Progressing   Problem: Health Behavior/Discharge Planning: Goal: Ability to manage health-related needs will improve Outcome: Progressing   Problem: Clinical Measurements: Goal: Ability to maintain clinical measurements within normal limits will improve Outcome: Progressing Goal: Will remain free from infection Outcome: Progressing Goal: Diagnostic test results will improve Outcome: Progressing Goal: Respiratory complications will improve Outcome: Progressing Goal: Cardiovascular complication will be avoided Outcome: Progressing   Problem: Activity: Goal: Risk for activity intolerance will decrease Outcome: Progressing   Problem: Nutrition: Goal: Adequate nutrition will be maintained Outcome: Progressing   Problem: Elimination: Goal: Will not experience complications related to bowel motility Outcome: Progressing Goal: Will not experience complications related to urinary retention Outcome: Progressing   Problem: Pain Managment: Goal: General experience of comfort will improve Outcome: Progressing   Problem: Safety: Goal: Ability to remain free from injury will improve Outcome: Progressing   Problem: Skin Integrity: Goal: Risk for impaired skin integrity will decrease Outcome: Progressing   Problem: Clinical Measurements: Goal: Ability to avoid or minimize complications of infection will improve Outcome: Progressing   Problem: Skin Integrity: Goal: Skin integrity will improve Outcome: Progressing

## 2017-11-18 NOTE — Progress Notes (Signed)
[facility] [place] NAME: Heather Larson    MR#:  [id number]  DATE OF BIRTH:  October 08, 1923  SUBJECTIVE: Patient has no fever, antibiotics are changed to Levaquin yesterday, repeat blood cultures are negative.  Daughter is upset because her white count is up and requesting ID evaluation called Dr. Bernita Larson to discuss the case.  CHIEF COMPLAINT:  No chief complaint on file. Patient says that she has chronic swelling of the legs but they are slightly tender during this admission.  Patient admitted on 17 July with sepsis and bilateral cellulitis with elevated lactic acid on admission, lactic acid level is down, repeat blood cultures from yesterday did not show any bacteria.  Patient is afebrile for last few days but the because of elevated WBC patient is still in the hospital.  The daughters are very concerned about her white count and wants to talk to infectious specialist.  I did call to Heather Larson, ID on-call Dr. Bernita Larson yesterday and today so we can discuss the case and follow his recommendations.  Patient at this time denies any pain in the stomach, cough, chest pain.  Her main complaint is tenderness in the legs more than any other complaint.  She is looking forward to going home.Marland Kitchen  REVIEW OF SYSTEMS:  Review of Systems  Constitutional: Positive for malaise/fatigue. Negative for chills and fever.  HENT: Negative for sore throat.   Eyes: Negative for blurred vision and double vision.  Respiratory: Negative for cough, hemoptysis, sputum production, shortness of breath, wheezing and stridor.   Cardiovascular: Positive for leg swelling. Negative for chest pain, palpitations and orthopnea.  Gastrointestinal: Negative for abdominal pain, blood in stool, diarrhea, melena, nausea and vomiting.  Genitourinary: Negative for dysuria, flank pain and hematuria.  Musculoskeletal: Negative for back pain and joint pain.  Skin: Negative for itching and  rash.       redness and swelling of both legs  Neurological: Negative for dizziness, sensory change, focal weakness, seizures, loss of consciousness, weakness and headaches.  Endo/Heme/Allergies: Negative for polydipsia.  Psychiatric/Behavioral: Negative for depression. The patient is not nervous/anxious.     DRUG ALLERGIES:  No Known Allergies VITALS:  Blood pressure 132/83, pulse (!) 136, temperature 99.1 F (37.3 C), temperature source Oral, resp. rate 18, height 5\' 2"  (1.575 m), weight 58.4 kg (128 lb 12.8 oz), SpO2 97 %. PHYSICAL EXAMINATION:  Physical Exam  Constitutional: She is oriented to person, place, and time. She appears well-developed.  HENT:  Head: Normocephalic.  Mouth/Throat: Oropharynx is clear and moist.  Eyes: Pupils are equal, round, and reactive to light. Conjunctivae and EOM are normal. No scleral icterus.  Neck: Normal range of motion. Neck supple. No JVD present. No tracheal deviation present.  Cardiovascular: Normal rate, regular rhythm and normal heart sounds. Exam reveals no gallop.  No murmur heard. Pulmonary/Chest: Effort normal and breath sounds normal. No respiratory distress. She has no wheezes. She has no rales.  Abdominal: Soft. Bowel sounds are normal. She exhibits no distension. There is no tenderness. There is no rebound.  Musculoskeletal: Normal range of motion. She exhibits edema. She exhibits no tenderness.  Neurological: She is alert and oriented to person, place, and time. No cranial nerve deficit.  Skin: No rash noted. No erythema.  Still erythema and swelling of both legs, no erythema on lower abdomen.  Psychiatric: She has a normal mood and affect.  Vitals reviewed.  LABORATORY PANEL:  Female CBC Recent Labs  Lab 11/18/17 0429  WBC 24.0*  HGB 8.4*  HCT 24.9*  PLT 454*   ------------------------------------------------------------------------------------------------------------------ Chemistries  Recent Labs  Lab 11/11/17 1612   11/17/17 0449  NA 128*   < > 129*  K 3.3*   < > 3.9  CL 98   < > 95*  CO2 20*   < > 26  GLUCOSE 139*   < > 98  BUN 23   < > 17  CREATININE 0.93   < > 0.73  CALCIUM 8.3*   < > 8.2*  AST 127*  --   --   ALT 80*  --   --   ALKPHOS 94  --   --   BILITOT 0.6  --   --    < > = values in this interval not displayed.   RADIOLOGY:  No results found. ASSESSMENT AND PLAN:  Heather Larson  is a 84 y.o. female with a known history of atrial fibrillation, chronic hyponatremia, chronic lower extremity edema, hypertension presents from independent living facility after her apartment manager and friend and found out that she had fever.   * Sepsis due to B/L LE and lower abdominal wall cellulitis and strep bacteremia, The patient was treated with vancomycin and cefepime.   Blood culture show Streptococcus.  pyogens from [place] , repeat blood cultures on July 20 did not show any growth for 24 hours.  Changed from Rocephin to Levaquin.yesterday.  Discharge canceled, daughter requested ID physician consult, patient ID physician from [facility]., Bilateral lower extremity edema, cellulitis, ordered ultrasound of legs DVT evaluation.  Low suspicion for DVT because patient already on Xarelto.   Lactic acidosis.  Improved with above treatment.  * pAfib.  Rapid ventricular rate due to sepsis.    Improved. Increased Toprol to 75 mg daily and continued Xarelto.  * HTN Increased Toprol, Lasix twice daily.  * Hyponatremia.  Improved to baseline with normal saline IV, sodium tablets p.o. patient does have chronic hyponatremia but sodium is improved to 129.  And on Bumex.   Generalized weakness.  PT evaluation: HHPT.  More than 50% time spent in counseling, coordination of the care.  All the records are reviewed and case discussed with Care Management/Social Worker. Management plans discussed with the patient, her daughters and they are in agreement.  CODE STATUS: DNR  TOTAL TIME TAKING CARE OF  THIS PATIENT: 42 minutes.   More than 50% of the time was spent in counseling/coordination of care: YES  POSSIBLE D/C IN 2 DAYS, DEPENDING ON CLINICAL CONDITION.   Heather Larson M.D on 11/18/2017 at 8:55 AM  Between 7am to 6pm - Pager - [phone]  After 6pm go to [email or web address] - [facility]

## 2017-11-18 NOTE — Progress Notes (Signed)
Talk with patient's daughter Shauna Hugh in the room.

## 2017-11-18 NOTE — Progress Notes (Signed)
Patient ambulated around nurses station twice with stand by assist and walker.

## 2017-11-19 ENCOUNTER — Encounter: Payer: Self-pay | Admitting: Primary Care

## 2017-11-19 LAB — CULTURE, BLOOD (ROUTINE X 2): Special Requests: ADEQUATE

## 2017-11-19 LAB — CBC
HCT: 27.7 % — ABNORMAL LOW (ref 35.0–47.0)
HEMOGLOBIN: 9.3 g/dL — AB (ref 12.0–16.0)
MCH: 26.4 pg (ref 26.0–34.0)
MCHC: 33.6 g/dL (ref 32.0–36.0)
MCV: 78.5 fL — ABNORMAL LOW (ref 80.0–100.0)
Platelets: 535 10*3/uL — ABNORMAL HIGH (ref 150–440)
RBC: 3.53 MIL/uL — AB (ref 3.80–5.20)
RDW: 15.3 % — ABNORMAL HIGH (ref 11.5–14.5)
WBC: 28.2 10*3/uL — ABNORMAL HIGH (ref 3.6–11.0)

## 2017-11-19 LAB — C DIFFICILE QUICK SCREEN W PCR REFLEX
C DIFFICLE (CDIFF) ANTIGEN: POSITIVE — AB
C Diff interpretation: DETECTED
C Diff toxin: POSITIVE — AB

## 2017-11-19 MED ORDER — LEVOFLOXACIN IN D5W 750 MG/150ML IV SOLN
750.0000 mg | INTRAVENOUS | Status: DC
  Filled 2017-11-19: qty 150

## 2017-11-19 MED ORDER — HYDRALAZINE HCL 50 MG PO TABS
50.0000 mg | ORAL_TABLET | Freq: Three times a day (TID) | ORAL | Status: DC
  Administered 2017-11-19 – 2017-11-27 (×20): 50 mg via ORAL
  Filled 2017-11-19 (×21): qty 1

## 2017-11-19 MED ORDER — VANCOMYCIN 50 MG/ML ORAL SOLUTION
125.0000 mg | Freq: Four times a day (QID) | ORAL | Status: DC
  Administered 2017-11-19 – 2017-11-27 (×32): 125 mg via ORAL
  Filled 2017-11-19 (×34): qty 2.5

## 2017-11-19 MED ORDER — CEFAZOLIN SODIUM-DEXTROSE 2-4 GM/100ML-% IV SOLN
2.0000 g | Freq: Three times a day (TID) | INTRAVENOUS | Status: DC
  Administered 2017-11-19 – 2017-11-22 (×9): 2 g via INTRAVENOUS
  Filled 2017-11-19 (×11): qty 100

## 2017-11-19 NOTE — Progress Notes (Signed)
PT Cancellation Note  Patient Details Name: Heather Larson MRN: [id number] DOB: [birth year]   Cancelled Treatment:    Reason Eval/Treat Not Completed: Patient declined, no reason specified. Treatment attempted; pt reports being exhausted due to not sleeping all night. Pt notes she is also having some additional testing done. Pt currently up in chair and wishes to defer PT at this time. Re attempt at a later time/date, as the schedule allows.    Larae Grooms, PTA 11/19/2017, 11:47 AM

## 2017-11-19 NOTE — Telephone Encounter (Signed)
Copied from [place] [id number]. Topic: Quick Communication - See Telephone Encounter >> Nov 19, 2017  3:06 PM Hewitt Shorts wrote: Pt daughter [name]  who is on her DPR and has medical power of attorney is calling to get some advise from Alma Friendly regarding her mother being in the hospital  For a week now and she feels like nothing is being done   Best number [phone]

## 2017-11-19 NOTE — Progress Notes (Signed)
Received call from lab that stool sample was positive for Cdiff. Dr. Vianne Bulls notified and received order for PO vanc 125 mg PO. Pt and daughter, Shauna Hugh, notified.   [place], Jerry Caras

## 2017-11-19 NOTE — Consult Note (Signed)
[facility] Nurse wound consult note Reason for Consult:Bilateral LEs with edema, erythema. Cellulitis, no wounds Wound type:infectious Pressure Injury POA: NA Measurement:N/A Wound bed:N/A Drainage (amount, consistency, odor) None Periwound:N/A Dressing procedure/placement/frequency: Patient and daughter engage in conversation regarding the patient's existing, mild edema and both note that it is improving.  She is not able to wear compression garments due to difficulty in donning and doffing.  I would suggest a compression device by the name of CircAid, that is removable and secures with Velcro straps. These devices are available both from fitters at local DME providers and also from other sources such as mail order supply houses. Cardiology may be the service to facilitate those if a prescription is required.  I do not think a referral to an outpatient wound care center is indicated (although they place many patients in these devices because the patient is without a wound and the visit would be considered preventive. My suggestions are to wash the LEs daily with soap and water, to rinse thoroughly and to apply a moisturizing cream with ceramides.  Until a mild compression device is available, I would suggest periods of elevation for the LEs. When ambulating, it is imperative that the patient wear protective shoe wear at all times to avoid injury and compromised wound healing.  [facility] nursing team will not follow, but will remain available to this patient, the nursing and medical teams.  Please re-consult if needed. Thanks, Maudie Flakes, MSN, RN, [place], Arther Abbott  Pager# [phone]

## 2017-11-19 NOTE — Progress Notes (Signed)
Stool C. difficile is positive, started on vancomycin p.o. at 125 mg p.o. every 6 hours, patient needs it for 14 days.

## 2017-11-19 NOTE — Progress Notes (Signed)
[facility] [place] NAME: Heather Larson    MR#:  [id number]  DATE OF BIRTH:  03-Sep-1923  She has no fever but has diarrhea 3 times last night, also feels some stomach discomfort.  Patient is slightly tachycardic because she did not sleep well and she is anxious.  WBC up to 28 but hemodynamically stable without any fever.  CHIEF COMPLAINT:  No chief complaint on file.  REVIEW OF SYSTEMS:  Review of Systems  Constitutional: Positive for malaise/fatigue. Negative for chills and fever.  HENT: Negative for sore throat.   Eyes: Negative for blurred vision and double vision.  Respiratory: Negative for cough, hemoptysis, sputum production, shortness of breath, wheezing and stridor.   Cardiovascular: Negative for chest pain, palpitations, orthopnea and leg swelling.  Gastrointestinal: Negative for abdominal pain, blood in stool, diarrhea, melena, nausea and vomiting.  Genitourinary: Negative for dysuria, flank pain and hematuria.  Musculoskeletal: Negative for back pain and joint pain.  Skin: Negative for itching and rash.       redness and swelling of both legs  Neurological: Negative for dizziness, sensory change, focal weakness, seizures, loss of consciousness, weakness and headaches.  Endo/Heme/Allergies: Negative for polydipsia.  Psychiatric/Behavioral: Negative for depression. The patient is not nervous/anxious.     DRUG ALLERGIES:  No Known Allergies VITALS:  Blood pressure (!) 143/87, pulse (!) 131, temperature 98.5 F (36.9 C), temperature source Oral, resp. rate 20, height 5\' 2"  (1.575 m), weight 59.4 kg (131 lb), SpO2 97 %. PHYSICAL EXAMINATION:  Physical Exam  Constitutional: She is oriented to person, place, and time. She appears well-developed.  HENT:  Head: Normocephalic.  Mouth/Throat: Oropharynx is clear and moist.  Eyes: Pupils are equal, round, and reactive to light. Conjunctivae and EOM are normal. No scleral icterus.    Neck: Normal range of motion. Neck supple. No JVD present. No tracheal deviation present.  Cardiovascular: Normal rate, regular rhythm and normal heart sounds. Exam reveals no gallop.  No murmur heard. Pulmonary/Chest: Effort normal and breath sounds normal. No respiratory distress. She has no wheezes. She has no rales.  Abdominal: Soft. Bowel sounds are normal. She exhibits no distension. There is no tenderness. There is no rebound.  Musculoskeletal: Normal range of motion. She exhibits edema. She exhibits no tenderness.  Neurological: She is alert and oriented to person, place, and time. No cranial nerve deficit.  Skin: No rash noted. No erythema.  Still erythema and swelling of both legs, no erythema on lower abdomen.  Psychiatric: She has a normal mood and affect.  Vitals reviewed.  LABORATORY PANEL:  Female CBC Recent Labs  Lab 11/19/17 0827  WBC 28.2*  HGB 9.3*  HCT 27.7*  PLT 535*   ------------------------------------------------------------------------------------------------------------------ Chemistries  Recent Labs  Lab 11/17/17 0449  NA 129*  K 3.9  CL 95*  CO2 26  GLUCOSE 98  BUN 17  CREATININE 0.73  CALCIUM 8.2*   RADIOLOGY:  No results found. ASSESSMENT AND PLAN:  Heather Larson  is a 84 y.o. female with a known history of atrial fibrillation, chronic hyponatremia, chronic lower extremity edema, hypertension presents from independent living facility after her apartment manager and friend and found out that she had fever.   * Sepsis due to B/L LE and lower abdominal wall cellulitis and strep bacteremia, The patient was treated with vancomycin and cefepime.   Blood culture show Streptococcus.  pyogens from [place] , repeat blood cultures on July 20 did not show any  growth for 24 hours.  Changed from Rocephin to Levaquin.yesterday.  Discussed case with Dr. Bernita Raisin from [facility] yesterday recommended to continue Levaquin.  Patient repeat blood cultures  have been negative but has diarrhea and also will check stool for C. difficile , stool cultures if they are negative for infection likely discharge home tomorrow.  Discussed with patient's daughter Shauna Hugh over the phone today again, explained about this.  Patient ultrasound of leg did not show DVT.  Patient, daughter patient's daughter are agreeable for this plan however they are very anxious and worried about persistent leukocytosis.  I spoke with Dr. Micheal Likens yesterday recommended no further work-up, WBC can be secondary to toxin release due to infection rather than acute process at this time., Bilateral lower extremity edema, cellulitis, ordered ultrasound of legs DVT evaluation.  Low suspicion for DVT because patient already on Xarelto.   Lactic acidosis.  Improved with above treatment.  * pAfib.  Rapid ventricular rate due to sepsis.    Improved. Increased Toprol to 75 mg daily and continued Xarelto.  * HTN Increased Toprol, Lasix twice daily.  * Hyponatremia.  Improved to baseline with normal saline IV, sodium tablets p.o. patient does have chronic hyponatremia but sodium is improved to 129.  And on Bumex.   Generalized weakness.  PT evaluation: HHPT. Anxiety,, restlessness: Today, patient  Needs restful nights.tachycardia due to anxiety. More than 50% time spent in counseling, coordination of the care.  All the records are reviewed and case discussed with Care Management/Social Worker. Management plans discussed with the patient, her daughters and they are in agreement.  CODE STATUS: DNR  TOTAL TIME TAKING CARE OF THIS PATIENT: 42 minutes.   More than 50% of the time was spent in counseling/coordination of care: YES  POSSIBLE D/C IN 2 DAYS, DEPENDING ON CLINICAL CONDITION.   Epifanio Lesches M.D on 11/19/2017 at 11:23 AM  Between 7am to 6pm - Pager - [phone]  After 6pm go to [email or web address] - [facility]

## 2017-11-19 NOTE — Progress Notes (Signed)
Pharmacist - Prescriber Communication  Levofloxacin dose has been modified from 500 mg IV Q24H to 750 mg IV Q48H due to CrCl 20 to 49 mL/min.  Lab report clarified with technician - streptococcus group G alone was isolated from BC[facility] - the reported pyogenes was an error. Only streptococcus group G grew from blood cultures on 11/11/17. Cultures from 11/17/17 still pending with no growth to date.  Susceptibilities from the cultures on 11/11/17 included hidden agents penicillin (sensitive) and ceftriaxone (sensitive). Consider switching from levofloxacin to ceftriaxone due to continued leukocytosis and tachycardia, if ID approves.   Everest Brod A. [place], [place].D., BCPS Clinical Pharmacist 11/19/2017 09:36

## 2017-11-19 NOTE — Progress Notes (Signed)
Physical Therapy Treatment Patient Details Name: Heather Larson MRN: [id number] DOB: [birth year] Today's Date: 11/19/2017    History of Present Illness 84 y.o. female with b/l LE cellulitis with PMH atrial fibrillation, chronic hyponatremia, chronic lower extremity edema, hypertension presents from independent living facility after her apartment manager and friend and found out that she had fever.      PT Comments    Pt does not feel able to tolerate ambulation today due to fatigue/exhaustion. Pt does participate in learning low level lower extremity exercises for range/strength. Pt/daughter demonstrate understanding. Continue PT to progress strength and mobility to improve overall function.    Follow Up Recommendations  Home health PT;Supervision - Intermittent     Equipment Recommendations       Recommendations for Other Services       Precautions / Restrictions Precautions Precautions: Fall Restrictions Weight Bearing Restrictions: No    Mobility  Bed Mobility               General bed mobility comments: Not tested; up in chair  Transfers                    Ambulation/Gait                 Stairs             Wheelchair Mobility    Modified Rankin (Stroke Patients Only)       Balance                                            Cognition Arousal/Alertness: Awake/alert Behavior During Therapy: WFL for tasks assessed/performed Overall Cognitive Status: Within Functional Limits for tasks assessed                                        Exercises Other Exercises Other Exercises: Pt/daughter educated on long sit/seated exercises pt can preform for low level strengthening and range including AP, QS, GS, heel slides or march, FAQ and hip abd/add or pillow squeeze.     General Comments        Pertinent Vitals/Pain Pain Assessment: No/denies pain    Home Living                      Prior  Function            PT Goals (current goals can now be found in the care plan section) Progress towards PT goals: Not progressing toward goals - comment    Frequency    Min 2X/week      PT Plan Current plan remains appropriate    Co-evaluation              AM-PAC PT "6 Clicks" Daily Activity  Outcome Measure  Difficulty turning over in bed (including adjusting bedclothes, sheets and blankets)?: None Difficulty moving from lying on back to sitting on the side of the bed? : A Little Difficulty sitting down on and standing up from a chair with arms (e.g., wheelchair, bedside commode, etc,.)?: A Little Help needed moving to and from a bed to chair (including a wheelchair)?: A Little Help needed walking in hospital room?: A Little Help needed climbing 3-5 steps with a railing? : A Lot 6 Click Score: 18  End of Session   Activity Tolerance: Patient limited by fatigue Patient left: in chair;with call bell/phone within reach;with chair alarm set;with family/visitor present   PT Visit Diagnosis: Other abnormalities of gait and mobility (R26.89);Unsteadiness on feet (R26.81);Muscle weakness (generalized) (M62.81)     Time: 5784-6962 PT Time Calculation (min) (ACUTE ONLY): 12 min  Charges:  $Therapeutic Exercise: 8-22 mins                    G Codes:        Larae Grooms, PTA 11/19/2017, 3:55 PM

## 2017-11-20 DIAGNOSIS — A0471 Enterocolitis due to Clostridium difficile, recurrent: Secondary | ICD-10-CM

## 2017-11-20 DIAGNOSIS — A0472 Enterocolitis due to Clostridium difficile, not specified as recurrent: Secondary | ICD-10-CM

## 2017-11-20 DIAGNOSIS — Z8619 Personal history of other infectious and parasitic diseases: Secondary | ICD-10-CM

## 2017-11-20 LAB — CBC
HCT: 26.9 % — ABNORMAL LOW (ref 35.0–47.0)
HEMOGLOBIN: 9.1 g/dL — AB (ref 12.0–16.0)
MCH: 26.4 pg (ref 26.0–34.0)
MCHC: 34.1 g/dL (ref 32.0–36.0)
MCV: 77.6 fL — AB (ref 80.0–100.0)
Platelets: 558 10*3/uL — ABNORMAL HIGH (ref 150–440)
RBC: 3.46 MIL/uL — AB (ref 3.80–5.20)
RDW: 15.9 % — ABNORMAL HIGH (ref 11.5–14.5)
WBC: 34.3 10*3/uL — AB (ref 3.6–11.0)

## 2017-11-20 MED ORDER — METOPROLOL SUCCINATE ER 50 MG PO TB24
100.0000 mg | ORAL_TABLET | Freq: Every day | ORAL | Status: DC
  Administered 2017-11-20 – 2017-11-27 (×8): 100 mg via ORAL
  Filled 2017-11-20 (×9): qty 2

## 2017-11-20 MED ORDER — RISAQUAD PO CAPS
2.0000 | ORAL_CAPSULE | Freq: Three times a day (TID) | ORAL | Status: DC
  Administered 2017-11-20 – 2017-11-27 (×22): 2 via ORAL
  Filled 2017-11-20 (×25): qty 2

## 2017-11-20 MED ORDER — BACID PO TABS
2.0000 | ORAL_TABLET | Freq: Three times a day (TID) | ORAL | Status: DC
  Filled 2017-11-20 (×4): qty 2

## 2017-11-20 NOTE — Progress Notes (Signed)
[facility] at [place] NAME: Heather Larson    MR#:  [id number]  DATE OF BIRTH:  1923-05-12  mild generalized abdominal pain.  Less diarrhea, WBC up at 34.  Patient is otherwise hemodynamically stable.  CHIEF COMPLAINT:  No chief complaint on file.  REVIEW OF SYSTEMS:  Review of Systems  Constitutional: Positive for malaise/fatigue. Negative for chills and fever.  HENT: Negative for sore throat.   Eyes: Negative for blurred vision and double vision.  Respiratory: Negative for cough, hemoptysis, sputum production, shortness of breath, wheezing and stridor.   Cardiovascular: Negative for chest pain, palpitations, orthopnea and leg swelling.  Gastrointestinal: Negative for abdominal pain, blood in stool, diarrhea, melena, nausea and vomiting.  Genitourinary: Negative for dysuria, flank pain and hematuria.  Musculoskeletal: Negative for back pain and joint pain.  Skin: Negative for itching and rash.       redness and swelling of both legs  Neurological: Negative for dizziness, sensory change, focal weakness, seizures, loss of consciousness, weakness and headaches.  Endo/Heme/Allergies: Negative for polydipsia.  Psychiatric/Behavioral: Negative for depression. The patient is not nervous/anxious.     DRUG ALLERGIES:  No Known Allergies VITALS:  Blood pressure (!) 143/73, pulse (!) 124, temperature 99.2 F (37.3 C), temperature source Oral, resp. rate 19, height 5\' 2"  (1.575 m), weight 56.7 kg (125 lb), SpO2 97 %. PHYSICAL EXAMINATION:  Physical Exam  Constitutional: She is oriented to person, place, and time. She appears well-developed.  HENT:  Head: Normocephalic.  Mouth/Throat: Oropharynx is clear and moist.  Eyes: Pupils are equal, round, and reactive to light. Conjunctivae and EOM are normal. No scleral icterus.  Neck: Normal range of motion. Neck supple. No JVD present. No tracheal deviation present.  Cardiovascular: Normal rate,  regular rhythm and normal heart sounds. Exam reveals no gallop.  No murmur heard. Pulmonary/Chest: Effort normal and breath sounds normal. No respiratory distress. She has no wheezes. She has no rales.  Abdominal: Soft. Bowel sounds are normal. She exhibits no distension. There is no tenderness. There is no rebound.  Musculoskeletal: Normal range of motion. She exhibits edema. She exhibits no tenderness.  Neurological: She is alert and oriented to person, place, and time. No cranial nerve deficit.  Skin: No rash noted. No erythema.  Still erythema and swelling of both legs, no erythema on lower abdomen.  Psychiatric: She has a normal mood and affect.  Vitals reviewed.  LABORATORY PANEL:  Female CBC Recent Labs  Lab 11/20/17 0819  WBC 34.3*  HGB 9.1*  HCT 26.9*  PLT 558*   ------------------------------------------------------------------------------------------------------------------ Chemistries  Recent Labs  Lab 11/17/17 0449  NA 129*  K 3.9  CL 95*  CO2 26  GLUCOSE 98  BUN 17  CREATININE 0.73  CALCIUM 8.2*   RADIOLOGY:  No results found. ASSESSMENT AND PLAN:  Heather Larson  is a 84 y.o. female with a known history of atrial fibrillation, chronic hyponatremia, chronic lower extremity edema, hypertension presents from independent living facility after her apartment manager and friend and found out that she had fever.   * Sepsis due to B/L LE and lower abdominal wall cellulitis and strep bacteremia, p Ancef at this time.    Lactic acidosis.  Improved with above treatment.  rptblood cultures are negative to date.  * pAfib.  Rapid ventricular rate due to sepsis.     Increased Toprol to 75 mg daily and continued Xarelto.  Consult cardiology.  * HTN Increased Toprol, hold evening  dose of Lasix because patient is concerned about not getting enough rest at night and also because of diarrhea and also she is prone for dehydration.  Hyponatremia.  Improved to baseline with  normal saline IV, sodium tablets p.o. patient does have chronic hyponatremia but sodium is improved to 129.  And on Bumex.   Generalized weakness.  PT evaluation: HHPT. Anxiety,, restlessness: Today, patient  Needs restful nights.tachycardia due to anxiety.  C. difficile colitis, WBCs up likely due to C. difficile colitis ,consult gastroenterology.  Continue vancomycin.  Add lactobacillus.  I discussed with patient's daughter [name] yesterday.  Patient seems to understand that she needs to stay and she is willing to stay answered all her questions.   More than 50% time spent in counseling, coordination of the care.  All the records are reviewed and case discussed with Care Management/Social Worker. Management plans discussed with the patient, her daughters and they are in agreement.  CODE STATUS: DNR  TOTAL TIME TAKING CARE OF THIS PATIENT: 42 minutes.   More than 50% of the time was spent in counseling/coordination of care: YES  POSSIBLE D/C IN 2 DAYS, DEPENDING ON CLINICAL CONDITION.   Epifanio Lesches M.D on 11/20/2017 at 9:34 AM  Between 7am to 6pm - Pager - [phone]  After 6pm go to [email or web address] - [facility]

## 2017-11-20 NOTE — Progress Notes (Signed)
Physical Therapy Treatment Patient Details Name: Maniya Donovan MRN: [id number] DOB: 04-10-1924 Today's Date: 11/20/2017    History of Present Illness 84 y.o. female with b/l LE cellulitis with PMH atrial fibrillation, chronic hyponatremia, chronic lower extremity edema, hypertension presents from independent living facility after her apartment manager and friend and found out that she had fever.      PT Comments    Refused initial attempt due to a busy AM but agreed on later attempt.  Stood and was able to ambulate x 2 around nursing unit with walker and min guard/supervision.  Slow steady gait with self initiated rest breaks.  Pt reports arm fatigue with gait.  Pt does keep RW too far in front of her as she would use a rollator which attributes to arm fatigue.  Daughter in attendance and plans to stay with pt initially upon discharge.   Follow Up Recommendations  Home health PT;Supervision - Intermittent     Equipment Recommendations       Recommendations for Other Services       Precautions / Restrictions Precautions Precautions: Fall Restrictions Weight Bearing Restrictions: No    Mobility  Bed Mobility               General bed mobility comments: Not tested; up in chair  Transfers Overall transfer level: Needs assistance Equipment used: Rolling walker (2 wheeled) Transfers: Sit to/from Stand Sit to Stand: Min guard         General transfer comment: vc's hand placments  Ambulation/Gait Ambulation/Gait assistance: Min guard Gait Distance (Feet): 360 Feet Assistive device: Rolling walker (2 wheeled) Gait Pattern/deviations: Step-through pattern;Decreased step length - right;Decreased step length - left;Trunk flexed Gait velocity: decreased Gait velocity interpretation: <1.8 ft/sec, indicate of risk for recurrent falls General Gait Details: Patient demonstrated flexed trunk, decreased speed   Stairs             Wheelchair Mobility    Modified  Rankin (Stroke Patients Only)       Balance Overall balance assessment: Needs assistance Sitting-balance support: Feet supported Sitting balance-Leahy Scale: Good     Standing balance support: Bilateral upper extremity supported Standing balance-Leahy Scale: Fair                              Cognition Arousal/Alertness: Awake/alert Behavior During Therapy: WFL for tasks assessed/performed Overall Cognitive Status: Within Functional Limits for tasks assessed                                        Exercises      General Comments        Pertinent Vitals/Pain Pain Assessment: 0-10 Pain Score: 2  Pain Location: LE soreness Pain Descriptors / Indicators: Sore Pain Intervention(s): Limited activity within patient's tolerance;Monitored during session    Home Living                      Prior Function            PT Goals (current goals can now be found in the care plan section) Progress towards PT goals: Progressing toward goals    Frequency    Min 2X/week      PT Plan Current plan remains appropriate    Co-evaluation              AM-PAC PT "  6 Clicks" Daily Activity  Outcome Measure  Difficulty turning over in bed (including adjusting bedclothes, sheets and blankets)?: None Difficulty moving from lying on back to sitting on the side of the bed? : A Little Difficulty sitting down on and standing up from a chair with arms (e.g., wheelchair, bedside commode, etc,.)?: A Little Help needed moving to and from a bed to chair (including a wheelchair)?: A Little Help needed walking in hospital room?: A Little Help needed climbing 3-5 steps with a railing? : A Lot 6 Click Score: 18    End of Session Equipment Utilized During Treatment: Gait belt Activity Tolerance: Patient limited by fatigue Patient left: in chair;with call bell/phone within reach;with chair alarm set;with family/visitor present         Time:  0277-4128 PT Time Calculation (min) (ACUTE ONLY): 23 min  Charges:  $Gait Training: 23-37 mins                    G Codes:       Chesley Noon, PTA 11/20/17, 11:31 AM

## 2017-11-20 NOTE — Consult Note (Addendum)
Heather Lame, MD [facility]  [address] [place],  [address] Phone: [phone] Fax : [phone]  Consultation  Referring Provider:     Dr. Vianne Bulls Primary Care Physician:  Pleas Koch, NP Primary Gastroenterologist:  Althia Forts         Reason for Consultation:     C. Difficile colitis  Date of Admission:  11/11/2017 Date of Consultation:  11/20/2017         HPI:   Heather Larson is a 84 y.o. female Who has a history of recurrent cellulitis of the abdominal wall.  The patient has been in the hospital since the 14th of this month and was treated antibiotics.  The patient started to have abdominal pain and increasing white cell count.. The patient had her stool sent off for evaluation and was found to have C. Difficile positive stools.  The patient was started on vancomycin 125 mg every 6 hours.  She reports that she was moving her bowels every 2 hours yesterday but has gone the last 3 hours without having a bowel movement and her last bowel movement she states was starting to be formed. The patient had a white cell count of 16.3 on admission that came down to 15.4 on the 19th then started climbing to 34.3 this morning. The patient had blood cultures sent off that were negative. The patient is presently on antibiotics for her cellulitis.  Past Medical History:  Diagnosis Date  . A-fib ([place])   . Bilateral lower extremity edema   . Cancer [facility])    breast cancer at age 8 and skin  . Cellulitis    lower abdomen  . Hyperlipidemia   . Hypertension   . Hypertension   . Incontinence   . Low sodium levels   . Macular degeneration     Past Surgical History:  Procedure Laterality Date  . BREAST SURGERY     right mastectomy  . EYE SURGERY     x3  . IR RADIOLOGIST EVAL & MGMT  09/17/2017  . MASTECTOMY     right  . SHOULDER OPEN ROTATOR CUFF REPAIR     left  . SKIN BIOPSY     skin cancer    Prior to Admission medications   Medication Sig Start Date End Date  Taking? Authorizing Provider  atorvastatin (LIPITOR) 10 MG tablet Take 1 tablet (10 mg total) by mouth daily. 10/02/17  Yes Gollan, Kathlene November, MD  brimonidine (ALPHAGAN P) 0.1 % SOLN Place 1 drop into both eyes 2 (two) times daily.   Yes [provider]  bumetanide (BUMEX) 1 MG tablet Take 1 tablet (1 mg total) by mouth daily as needed. Patient takes 1/2 tablet daily. 10/02/17  Yes Minna Merritts, MD  docusate sodium (COLACE) 100 MG capsule Take 1 capsule (100 mg total) by mouth daily. 04/16/17  Yes Dorie Rank, MD  hydrALAZINE (APRESOLINE) 50 MG tablet Take 1 tablet (50 mg total) by mouth 3 (three) times daily. 10/02/17  Yes Gollan, Kathlene November, MD  meloxicam (MOBIC) 7.5 MG tablet Take 7.5 mg by mouth daily.   Yes [provider]  metoprolol succinate (TOPROL-XL) 50 MG 24 hr tablet Take 1 tablet (50 mg total) by mouth daily. Take with or immediately following a meal. 10/02/17  Yes Gollan, Kathlene November, MD  Multiple Vitamins-Minerals (MULTIVITAMIN ADULT) TABS Take 1 tablet by mouth daily.   Yes [provider]  Rivaroxaban (XARELTO) 15 MG TABS tablet Take 1 tablet (15 mg  total) by mouth daily. 10/02/17  Yes Gollan, Kathlene November, MD  travoprost, benzalkonium, (TRAVATAN) 0.004 % ophthalmic solution Place 1 drop into both eyes at bedtime.   Yes [provider]  valsartan-hydrochlorothiazide (DIOVAN-HCT) 160-25 MG tablet Take 1 tablet by mouth daily. 10/02/17  Yes Gollan, Kathlene November, MD  vitamin C (ASCORBIC ACID) 500 MG tablet Take 500 mg by mouth daily.   Yes [provider]  levofloxacin (LEVAQUIN) 500 MG tablet Take 1 tablet (500 mg total) by mouth daily for 10 days. 11/17/17 11/27/17  Epifanio Lesches, MD  metoprolol succinate (TOPROL-XL) 25 MG 24 hr tablet Take 3 tablets (75 mg total) by mouth daily. 11/17/17   Epifanio Lesches, MD    History reviewed. No pertinent family history.   Social History   Tobacco Use  . Smoking status: Never Smoker  . Smokeless  tobacco: Never Used  Substance Use Topics  . Alcohol use: Yes    Alcohol/week: 1.2 oz    Types: 2 Shots of liquor per week    Comment: 2 drinks daily  . Drug use: No    Allergies as of 11/11/2017  . (No Known Allergies)    Review of Systems:    All systems reviewed and negative except where noted in HPI.   Physical Exam:  Vital signs in last 24 hours: Temp:  [98.9 F (37.2 C)-99.2 F (37.3 C)] 98.9 F (37.2 C) (07/23 1554) Pulse Rate:  [75-124] 75 (07/23 1554) Resp:  [18-19] 18 (07/23 1554) BP: (120-150)/(71-91) 145/81 (07/23 1554) SpO2:  [97 %-99 %] 98 % (07/23 1554) Weight:  [125 lb (56.7 kg)] 125 lb (56.7 kg) (07/23 0500) Last BM Date: 11/20/17 General:   Pleasant, cooperative in NAD Head:  Normocephalic and atraumatic. Eyes:   No icterus.   Conjunctiva pink. PERRLA. Ears:  Normal auditory acuity. Neck:  Supple; no masses or thyroidomegaly Lungs: Respirations even and unlabored. Lungs clear to auscultation bilaterally.   No wheezes, crackles, or rhonchi.  Heart:  Regular rate and rhythm;  Without murmur, clicks, rubs or gallops Abdomen:  Soft, Mildly distended, nontender. Normal bowel sounds. No appreciable masses or hepatomegaly.  No rebound or guarding.  Rectal:  Not performed. Msk:  Symmetrical without gross deformities.    Extremities:  Without edema, cyanosis or clubbing. Neurologic:  Alert and oriented x3;  grossly normal neurologically. Skin:  Abdominal wall cellulitis Cervical Nodes:  No significant cervical adenopathy. Psych:  Alert and cooperative. Normal affect.  LAB RESULTS: Recent Labs    11/18/17 0429 11/19/17 0827 11/20/17 0819  WBC 24.0* 28.2* 34.3*  HGB 8.4* 9.3* 9.1*  HCT 24.9* 27.7* 26.9*  PLT 454* 535* 558*   BMET No results for input(s): NA, K, CL, CO2, GLUCOSE, BUN, CREATININE, CALCIUM in the last 72 hours. LFT No results for input(s): PROT, ALBUMIN, AST, ALT, ALKPHOS, BILITOT, BILIDIR, IBILI in the last 72 hours. PT/INR No results  for input(s): LABPROT, INR in the last 72 hours.  STUDIES: No results found.    Impression / Plan:   Assessment: Active Problems:   Bilateral lower leg cellulitis   Heather Larson is a 84 y.o. y/o female with C. Difficile colitis found yesterday with a white cell count elevated to 34.3.  The patient feels better today than she did yesterday.  The patient also has had less diarrhea with more formed stools since she was started on vancomycin.  Although the elevated white cell count is worrisome the patient clinically appears much better than she had been reported  to be yesterday by the patient's daughter.   Plan:  I recommend keeping the patient on vancomycin at the present time.  The patient should be treated for at least 10 days and if she does not respond can be switched over to deficit.  Stool transplant is not an option at this time since that has been put on hold until it can be reviewed by the hospital internal review board. The patient was also noted to have increased LFTs and they will be checked again tomorrow.  The patient and her daughter have been explained the plan and agree with it.  Thank you for involving me in the care of this patient.      LOS: 9 days   Heather Lame, MD  11/20/2017, 4:28 PM    Note: This dictation was prepared with Dragon dictation along with smaller phrase technology. Any transcriptional errors that result from this process are unintentional.

## 2017-11-21 LAB — CBC
HEMATOCRIT: 25.5 % — AB (ref 35.0–47.0)
Hemoglobin: 8.6 g/dL — ABNORMAL LOW (ref 12.0–16.0)
MCH: 26.3 pg (ref 26.0–34.0)
MCHC: 33.7 g/dL (ref 32.0–36.0)
MCV: 77.8 fL — ABNORMAL LOW (ref 80.0–100.0)
Platelets: 505 10*3/uL — ABNORMAL HIGH (ref 150–440)
RBC: 3.28 MIL/uL — ABNORMAL LOW (ref 3.80–5.20)
RDW: 15.8 % — AB (ref 11.5–14.5)
WBC: 29.9 10*3/uL — AB (ref 3.6–11.0)

## 2017-11-21 LAB — HEPATIC FUNCTION PANEL
ALT: 16 U/L (ref 0–44)
AST: 22 U/L (ref 15–41)
Albumin: 2.4 g/dL — ABNORMAL LOW (ref 3.5–5.0)
Alkaline Phosphatase: 85 U/L (ref 38–126)
Bilirubin, Direct: 0.1 mg/dL (ref 0.0–0.2)
Indirect Bilirubin: 0.5 mg/dL (ref 0.3–0.9)
Total Bilirubin: 0.6 mg/dL (ref 0.3–1.2)
Total Protein: 5.7 g/dL — ABNORMAL LOW (ref 6.5–8.1)

## 2017-11-21 NOTE — Progress Notes (Addendum)
Heather Lame, MD [facility]   [address], [place], [place] [address] Phone: [phone] Fax : [phone]   Subjective: The patient reports having a episode of diarrhea last night but has been having more solid bowel movements today.  The patient also has had a drop in her white cell count.  She continues to have some abdominal distention but no abdominal pain.  The patient has not had any nausea vomiting.   Objective: Vital signs in last 24 hours: Vitals:   11/20/17 1944 11/21/17 0018 11/21/17 0546 11/21/17 0742  BP:  (!) 102/41 115/63 122/82  Pulse:  74 (!) 107 (!) 103  Resp:  14    Temp:  98.2 F (36.8 C) 98.6 F (37 C) 98.2 F (36.8 C)  TempSrc:  Oral Oral Oral  SpO2: 97% 96% 96% 96%  Weight:      Height:       Weight change:  No intake or output data in the 24 hours ending 11/21/17 1332   Exam: Heart:: Regular rate and rhythm, S1S2 present or without murmur or extra heart sounds Lungs: normal and clear to auscultation and percussion Abdomen: Tympanic distended with positive bowel sounds   Lab Results: @LABTEST2 @ Micro Results: Recent Results (from the past 240 hour(s))  Blood Culture (routine x 2)     Status: Abnormal   Collection Time: 11/11/17  4:12 PM  Result Value Ref Range Status   Specimen Description   Final    BLOOD BLOOD RIGHT WRIST Performed at [facility], [address] [place], [place] [address]    Special Requests   Final    BOTTLES DRAWN AEROBIC AND ANAEROBIC Blood Culture adequate volume Performed at [facility], [address] [place], [place] [address]    Culture  Setup Time   Final    GRAM POSITIVE COCCI IN BOTH AEROBIC AND ANAEROBIC BOTTLES CRITICAL VALUE NOTED.  VALUE IS CONSISTENT WITH PREVIOUSLY REPORTED AND CALLED VALUE. Performed at [facility], [address] [place], [place] [address]    Culture (A)  Final    STREPTOCOCCUS GROUP G SUSCEPTIBILITIES PERFORMED ON PREVIOUS CULTURE WITHIN  THE LAST 5 DAYS. Performed at [facility], [place] [address], [place], [place] [address]    Report Status 11/14/2017 FINAL  Final  Blood Culture (routine x 2)     Status: Abnormal   Collection Time: 11/11/17  4:19 PM  Result Value Ref Range Status   Specimen Description   Final    BLOOD R F A Performed at [facility], [address], [place], [place] [address]    Special Requests   Final    BOTTLES DRAWN AEROBIC AND ANAEROBIC Blood Culture adequate volume Performed at [facility], [address] [place], [place] [address]    Culture  Setup Time   Final    GRAM POSITIVE COCCI IN BOTH AEROBIC AND ANAEROBIC BOTTLES CRITICAL RESULT CALLED TO, READ BACK BY AND VERIFIED WITH: JASON ROBBINS AT 0456 ON 11/12/17 [place].    Culture (A)  Final    STREPTOCOCCUS GROUP G CORRECTED ON 07/22 AT 0932: PREVIOUSLY REPORTED AS STREPTOCOCCUS PYOGENES RESULT CALLED TO, READ BACK BY AND VERIFIED WITH: N. COOKSON, Fidelis ([place]) AT 0930 ON 11/19/17 BY C. JESSUP, MLT. Performed at [facility], [place] [address] [place],  [address]    Report Status 11/14/2017 FINAL  Final   Organism ID, Bacteria STREPTOCOCCUS GROUP G  Final      Susceptibility  Streptococcus group g - MIC*    PENICILLIN <=0.06 SENSITIVE Sensitive     CEFTRIAXONE <=0.12 SENSITIVE Sensitive     ERYTHROMYCIN <=0.12 SENSITIVE Sensitive     LEVOFLOXACIN 0.5 SENSITIVE Sensitive     VANCOMYCIN 0.5 SENSITIVE Sensitive     * STREPTOCOCCUS GROUP G CORRECTED ON 07/22 AT 0932: PREVIOUSLY REPORTED AS STREPTOCOCCUS PYOGENES  Urine culture     Status: None   Collection Time: 11/11/17  4:19 PM  Result Value Ref Range Status   Specimen Description   Final    URINE, RANDOM Performed at [facility], [address] [place], [place] [address]    Special Requests   Final    NONE Performed at [facility], [address], [place], [place] [address]    Culture   Final    NO GROWTH Performed at  [facility], [place] [address] [place], [place] [address]    Report Status 11/12/2017 FINAL  Final  Blood Culture ID Panel (Reflexed)     Status: Abnormal   Collection Time: 11/11/17  4:19 PM  Result Value Ref Range Status   Enterococcus species NOT DETECTED NOT DETECTED Final   Listeria monocytogenes NOT DETECTED NOT DETECTED Final   Staphylococcus species NOT DETECTED NOT DETECTED Final   Staphylococcus aureus NOT DETECTED NOT DETECTED Final   Streptococcus species DETECTED (A) NOT DETECTED Final    Comment: Not Enterococcus species, Streptococcus agalactiae, Streptococcus pyogenes, or Streptococcus pneumoniae. CRITICAL RESULT CALLED TO, READ BACK BY AND VERIFIED WITH: JASON ROBBINS AT 6063 11/12/17 [place]    Streptococcus agalactiae NOT DETECTED NOT DETECTED Final   Streptococcus pneumoniae NOT DETECTED NOT DETECTED Final   Streptococcus pyogenes NOT DETECTED NOT DETECTED Final   Acinetobacter baumannii NOT DETECTED NOT DETECTED Final   Enterobacteriaceae species NOT DETECTED NOT DETECTED Final   Enterobacter cloacae complex NOT DETECTED NOT DETECTED Final   Escherichia coli NOT DETECTED NOT DETECTED Final   Klebsiella oxytoca NOT DETECTED NOT DETECTED Final   Klebsiella pneumoniae NOT DETECTED NOT DETECTED Final   Proteus species NOT DETECTED NOT DETECTED Final   Serratia marcescens NOT DETECTED NOT DETECTED Final   Haemophilus influenzae NOT DETECTED NOT DETECTED Final   Neisseria meningitidis NOT DETECTED NOT DETECTED Final   Pseudomonas aeruginosa NOT DETECTED NOT DETECTED Final   Candida albicans NOT DETECTED NOT DETECTED Final   Candida glabrata NOT DETECTED NOT DETECTED Final   Candida krusei NOT DETECTED NOT DETECTED Final   Candida parapsilosis NOT DETECTED NOT DETECTED Final   Candida tropicalis NOT DETECTED NOT DETECTED Final    Comment: Performed at [facility], [address] [place], [place] [address]  CULTURE, BLOOD (ROUTINE X 2) w Reflex to ID Panel      Status: None (Preliminary result)   Collection Time: 11/17/17  9:20 AM  Result Value Ref Range Status   Specimen Description BLOOD Blood Culture adequate volume  Final   Special Requests   Final    BOTTLES DRAWN AEROBIC AND ANAEROBIC BLOOD LEFT HAND   Culture   Final    NO GROWTH 3 DAYS Performed at [facility], [address] [place], [place] [address]    Report Status PENDING  Incomplete  CULTURE, BLOOD (ROUTINE X 2) w Reflex to ID Panel     Status: None (Preliminary result)   Collection Time: 11/17/17  9:32 AM  Result Value Ref Range Status   Specimen Description BLOOD Blood Culture adequate volume  Final  Special Requests   Final    BOTTLES DRAWN AEROBIC AND ANAEROBIC RESISTANT WRIST   Culture   Final    NO GROWTH 3 DAYS Performed at [facility], [place], [place] [address]    Report Status PENDING  Incomplete  C difficile quick scan w PCR reflex     Status: Abnormal   Collection Time: 11/19/17 11:45 AM  Result Value Ref Range Status   C Diff antigen POSITIVE (A) NEGATIVE Final    Comment: RESULT CALLED TO, READ BACK BY AND VERIFIED WITH: CORY HUDSON @1350  11/19/17 [place]    C Diff toxin POSITIVE (A) NEGATIVE Final    Comment: RESULT CALLED TO, READ BACK BY AND VERIFIED WITH: CORY HUDSON @1350  11/19/17 [facility]    C Diff interpretation Toxin producing C. difficile detected.  Final    Comment: RESULT CALLED TO, READ BACK BY AND VERIFIED WITH: CORY HUDSON @1350  11/19/17 [facility] Performed at [facility], [address] [place], [place] [address]    Studies/Results: No results found. Medications: I have reviewed the patient's current medications. Scheduled Meds: . acidophilus  2 capsule Oral TID  . atorvastatin  10 mg Oral Daily  . brimonidine  1 drop Both Eyes BID  . furosemide  20 mg Oral BID  . hydrALAZINE  50 mg Oral Q8H  . meloxicam  7.5 mg Oral Daily  . metoprolol succinate  100 mg Oral Daily  . Rivaroxaban  15 mg Oral  Daily  . Travoprost (BAK Free)  1 drop Both Eyes QHS  . vancomycin  125 mg Oral Q6H   Continuous Infusions: .  ceFAZolin (ANCEF) IV Stopped (11/21/17 0618)   PRN Meds:.acetaminophen **OR** acetaminophen, albuterol, diphenhydrAMINE, ibuprofen, ondansetron **OR** ondansetron (ZOFRAN) IV   Assessment: Active Problems:   Bilateral lower leg cellulitis   C. difficile colitis    Plan: This patient has C. difficile colitis that is being treated with vancomycin at the present time.  The patient has not had any nausea vomiting but does report that she has abdominal distention.  The patient had some diarrhea yesterday but is now having more solid stools. The patient will be started again on physical therapy to help her get out of bed and mobilize the gas from her intestines.  The patient and her daughter have been explained the plan and agree with it.   LOS: 10 days   Tyshika Baldridge 11/21/2017, 1:32 PM

## 2017-11-21 NOTE — Progress Notes (Signed)
[facility] [place] NAME: Heather Larson    MR#:  [id number]  DATE OF BIRTH:  July 29, 1923  Abdominal pain, nausea, low-grade fever last night and patient also had diarrhea 3 times last night.  CHIEF COMPLAINT:  No chief complaint on file. Has no fever today, diarrhea also decreased this morning. REVIEW OF SYSTEMS:  Review of Systems  Constitutional: Positive for fever and malaise/fatigue. Negative for chills.  HENT: Negative for sore throat.   Eyes: Negative for blurred vision and double vision.  Respiratory: Negative for cough, hemoptysis, sputum production, shortness of breath, wheezing and stridor.   Cardiovascular: Negative for chest pain, palpitations, orthopnea and leg swelling.  Gastrointestinal: Positive for abdominal pain and diarrhea. Negative for blood in stool, melena, nausea and vomiting.  Genitourinary: Negative for dysuria, flank pain and hematuria.  Musculoskeletal: Negative for back pain and joint pain.  Skin: Negative for itching and rash.       redness and swelling of both legs  Neurological: Negative for dizziness, sensory change, focal weakness, seizures, loss of consciousness, weakness and headaches.  Endo/Heme/Allergies: Negative for polydipsia.  Psychiatric/Behavioral: Negative for depression. The patient is not nervous/anxious.     DRUG ALLERGIES:  No Known Allergies VITALS:  Blood pressure 122/82, pulse (!) 103, temperature 98.2 F (36.8 C), temperature source Oral, resp. rate 14, height 5\' 2"  (1.575 m), weight 56.7 kg (125 lb), SpO2 96 %. PHYSICAL EXAMINATION:  Physical Exam  Constitutional: She is oriented to person, place, and time. She appears well-developed.  HENT:  Head: Normocephalic.  Mouth/Throat: Oropharynx is clear and moist.  Eyes: Pupils are equal, round, and reactive to light. Conjunctivae and EOM are normal. No scleral icterus.  Neck: Normal range of motion. Neck supple. No JVD present. No  tracheal deviation present.  Cardiovascular: Normal rate, regular rhythm and normal heart sounds. Exam reveals no gallop.  No murmur heard. Pulmonary/Chest: Effort normal and breath sounds normal. No respiratory distress. She has no wheezes. She has no rales.  Abdominal: Soft. Bowel sounds are normal. She exhibits no distension. There is no tenderness. There is no rebound.  Musculoskeletal: Normal range of motion. She exhibits edema. She exhibits no tenderness.  Neurological: She is alert and oriented to person, place, and time. No cranial nerve deficit.  Skin: No rash noted. No erythema.  Still erythema and swelling of both legs, no erythema on lower abdomen.  Psychiatric: She has a normal mood and affect.  Vitals reviewed.  LABORATORY PANEL:  Female CBC Recent Labs  Lab 11/21/17 1040  WBC 29.9*  HGB 8.6*  HCT 25.5*  PLT 505*   ------------------------------------------------------------------------------------------------------------------ Chemistries  Recent Labs  Lab 11/17/17 0449 11/21/17 0416  NA 129*  --   K 3.9  --   CL 95*  --   CO2 26  --   GLUCOSE 98  --   BUN 17  --   CREATININE 0.73  --   CALCIUM 8.2*  --   AST  --  22  ALT  --  16  ALKPHOS  --  85  BILITOT  --  0.6   RADIOLOGY:  No results found. ASSESSMENT AND PLAN:  Seline Enzor  is a 84 y.o. female with a known history of atrial fibrillation, chronic hyponatremia, chronic lower extremity edema, hypertension presents from independent living facility after her apartment manager and friend and found out that she had fever.   * Sepsis due to B/L LE and lower abdominal wall  cellulitis and strep bacteremia, on Ancef at this time.    Lactic acidosis.  Improved with above treatment.  rptblood cultures are negative to date.  * pAfib.  Rapid ventricular rate due to sepsis.     Increased Toprol to 75 mg daily and continued Xarelto.   * HTN Increased Toprol, hold evening dose of Lasix because patient is  concerned about not getting enough rest at night and also because of diarrhea and also she is prone for dehydration.    Hyponatremia.  Improved to baseline with normal saline IV, sodium tablets p.o. patient does have chronic hyponatremia but sodium is improved to 129.  And on Bumex.   Generalized weakness.  PT evaluation: HHPT. Anxiety,, restlessness:   C. difficile colitis, continue p.o. vancomycin 125 mg p.o. every 6 hours for 14 days.  Seen by gastroenterology, patient had diarrhea last night but has more formed stool this morning.  WBC also down from 34.3 to  29.9.  Discussed with patient's daughter-in-law.  More than 50% time spent in counseling, coordination of the care.  All the records are reviewed and case discussed with Care Management/Social Worker. Management plans discussed with the patient, her daughters and they are in agreement.  CODE STATUS: DNR  TOTAL TIME TAKING CARE OF THIS PATIENT: 42 minutes.   POSSIBLE D/C IN 2 DAYS, DEPENDING ON CLINICAL CONDITION.   Epifanio Lesches M.D on 11/21/2017 at 2:53 PM  Between 7am to 6pm - Pager - [phone]  After 6pm go to [email or web address] - [facility]

## 2017-11-21 NOTE — Progress Notes (Signed)
Daughter would like MD to call Dr. Tamera Punt at [phone] or [phone]

## 2017-11-22 LAB — CULTURE, BLOOD (ROUTINE X 2)
Culture: NO GROWTH
Culture: NO GROWTH
Specimen Description: ADEQUATE
Specimen Description: ADEQUATE

## 2017-11-22 LAB — CBC
HEMATOCRIT: 25.1 % — AB (ref 35.0–47.0)
Hemoglobin: 8.4 g/dL — ABNORMAL LOW (ref 12.0–16.0)
MCH: 26.1 pg (ref 26.0–34.0)
MCHC: 33.6 g/dL (ref 32.0–36.0)
MCV: 77.7 fL — ABNORMAL LOW (ref 80.0–100.0)
PLATELETS: 528 10*3/uL — AB (ref 150–440)
RBC: 3.23 MIL/uL — ABNORMAL LOW (ref 3.80–5.20)
RDW: 15.9 % — AB (ref 11.5–14.5)
WBC: 17 10*3/uL — AB (ref 3.6–11.0)

## 2017-11-22 MED ORDER — ENSURE ENLIVE PO LIQD
237.0000 mL | Freq: Two times a day (BID) | ORAL | Status: DC
  Administered 2017-11-23 – 2017-11-27 (×8): 237 mL via ORAL

## 2017-11-22 MED ORDER — ADULT MULTIVITAMIN W/MINERALS CH
1.0000 | ORAL_TABLET | Freq: Every day | ORAL | Status: DC
  Administered 2017-11-23 – 2017-11-27 (×5): 1 via ORAL
  Filled 2017-11-22 (×5): qty 1

## 2017-11-22 NOTE — Progress Notes (Signed)
Initial Nutrition Assessment  DOCUMENTATION CODES:   Non-severe (moderate) malnutrition in context of chronic illness  INTERVENTION:   Ensure Enlive po BID, each supplement provides 350 kcal and 20 grams of protein  MVI daily  Magic cup TID with meals, each supplement provides 290 kcal and 9 grams of protein  Chopped food on meal trays   NUTRITION DIAGNOSIS:   Moderate Malnutrition related to (advanced age, heart disease ) as evidenced by mild fat depletion, moderate muscle depletion.  GOAL:   Patient will meet greater than or equal to 90% of their needs  MONITOR:   PO intake, Supplement acceptance, Labs, Weight trends, I & O's, Skin  REASON FOR ASSESSMENT:   LOS    ASSESSMENT:   84 y.o. female with a known history of atrial fibrillation, chronic hyponatremia, chronic lower extremity edema, hypertension presents from independent living facility with fever. Pt found to have cellulitis and C-diff    Met with pt and pt's two daughters in room today. Pt reports decreased appetite and oral intake for several weeks now. Pt reports that she lives in a SNF and usually eats at least two meals per day. Family at bedside reports that pt needs encouragement to eat most of the time. Pt does like Ensure, but does not drink them regularly. Pt loves ice cream. Pt reports her UBW is around 130lbs and that she weighed 140lbs at her heaviest. It appears pt has had ~5-9lb weight loss over the past month; however, unable to determine how much weight loss is r/t fluid loses as pt with C-diff diarrhea. Pt reports diarrhea is improving. RD will order supplements and MVI to help pt meet her estimated needs. Pt requires chopped foods as she has poor dentition. Pt ate 100% of her breakfast this morning but skipped lunch as she reports she was still full. Pt planning to eat soup and salad for dinner tonight.   Medications reviewed and include: risaquad, meloxicam, vancomycin   Labs reviewed: wbc-  17.0(H), Hgb 8.4(L), Hct 25.1(L). MCV 77.7(L)  NUTRITION - FOCUSED PHYSICAL EXAM:    Most Recent Value  Orbital Region  Mild depletion  Upper Arm Region  No depletion  Thoracic and Lumbar Region  Mild depletion  Buccal Region  Mild depletion  Temple Region  Moderate depletion  Clavicle Bone Region  Moderate depletion  Clavicle and Acromion Bone Region  Moderate depletion  Scapular Bone Region  Moderate depletion  Dorsal Hand  Moderate depletion  Patellar Region  Unable to assess  Anterior Thigh Region  Unable to assess  Posterior Calf Region  Unable to assess  Edema (RD Assessment)  Mild  Hair  Reviewed  Eyes  Reviewed  Mouth  Reviewed  Skin  Reviewed  Nails  Reviewed     Diet Order:   Diet Order           Diet regular Room service appropriate? Yes; Fluid consistency: Thin  Diet effective now         EDUCATION NEEDS:   Education needs have been addressed  Skin:  Skin Assessment: Reviewed RN Assessment(cellulitis BLE, abdomen )  Last BM:  7/25- type 7  Height:   Ht Readings from Last 1 Encounters:  11/11/17 '5\' 2"'  (1.575 m)    Weight:   Wt Readings from Last 1 Encounters:  11/22/17 121 lb (54.9 kg)    Ideal Body Weight:  50 kg  BMI:  Body mass index is 22.13 kg/m.  Estimated Nutritional Needs:   Kcal:  1300-1500kcal/day  Protein:  61-72g/day   Fluid:  >1.4L/day   Koleen Distance MS, RD, LDN Pager #- [phone] Office#- [phone] After Hours Pager: [phone]

## 2017-11-22 NOTE — Progress Notes (Signed)
Lucilla Lame, MD [facility], [facility]   [address] [place], [place] [address] Phone: [phone] Fax : [phone]   Subjective: The patient had a couple of episodes of diarrhea yesterday.  The patient has not had any diarrhea today but did have some solid bowel movement today.  The patient reports that she feels like there is still remaining in her colon despite having a rectal exam by the nurse post voiding which showed no stool in the rectum.  The patient's white cell count has gone down significantly over the last few days.   Objective: Vital signs in last 24 hours: Vitals:   11/21/17 1622 11/21/17 2333 11/22/17 0420 11/22/17 0735  BP: (!) 102/49 138/71  121/73  Pulse: 67 73  88  Resp:  12    Temp: 98.1 F (36.7 C) 97.7 F (36.5 C)  98.3 F (36.8 C)  TempSrc: Oral Oral  Oral  SpO2: 96% 99%  99%  Weight:   121 lb (54.9 kg)   Height:       Weight change:   Intake/Output Summary (Last 24 hours) at 11/22/2017 1422 Last data filed at 11/22/2017 0631 Gross per 24 hour  Intake 993.33 ml  Output -  Net 993.33 ml     Exam: Heart:: Regular rate and rhythm, S1S2 present or without murmur or extra heart sounds Lungs: normal and clear to auscultation and percussion Abdomen: soft, nontender, normal bowel sounds   Lab Results: @LABTEST2 @ Micro Results: Recent Results (from the past 240 hour(s))  CULTURE, BLOOD (ROUTINE X 2) w Reflex to ID Panel     Status: None   Collection Time: 11/17/17  9:20 AM  Result Value Ref Range Status   Specimen Description BLOOD Blood Culture adequate volume  Final   Special Requests   Final    BOTTLES DRAWN AEROBIC AND ANAEROBIC BLOOD LEFT HAND   Culture   Final    NO GROWTH 5 DAYS Performed at [facility], [address] [place], [place] [address]    Report Status 11/22/2017 FINAL  Final  CULTURE, BLOOD (ROUTINE X 2) w Reflex to ID Panel     Status: None   Collection Time: 11/17/17  9:32 AM  Result Value Ref Range Status   Specimen Description BLOOD Blood Culture adequate volume  Final   Special Requests   Final    BOTTLES DRAWN AEROBIC AND ANAEROBIC RESISTANT WRIST   Culture   Final    NO GROWTH 5 DAYS Performed at [facility], [address] [place], [place] [address]    Report Status 11/22/2017 FINAL  Final  C difficile quick scan w PCR reflex     Status: Abnormal   Collection Time: 11/19/17 11:45 AM  Result Value Ref Range Status   C Diff antigen POSITIVE (A) NEGATIVE Final    Comment: RESULT CALLED TO, READ BACK BY AND VERIFIED WITH: CORY HUDSON @1350  11/19/17 AKT    C Diff toxin POSITIVE (A) NEGATIVE Final    Comment: RESULT CALLED TO, READ BACK BY AND VERIFIED WITH: CORY HUDSON @1350  11/19/17 [facility]    C Diff interpretation Toxin producing C. difficile detected.  Final    Comment: RESULT CALLED TO, READ BACK BY AND VERIFIED WITH: CORY HUDSON @1350  11/19/17 AKT Performed at [facility], [address] [place], [place] [address]    Studies/Results: No results found. Medications: I have reviewed the patient's current medications. Scheduled Meds: . acidophilus  2 capsule Oral TID  . atorvastatin  10  mg Oral Daily  . brimonidine  1 drop Both Eyes BID  . hydrALAZINE  50 mg Oral Q8H  . meloxicam  7.5 mg Oral Daily  . metoprolol succinate  100 mg Oral Daily  . Rivaroxaban  15 mg Oral Daily  . Travoprost (BAK Free)  1 drop Both Eyes QHS  . vancomycin  125 mg Oral Q6H   Continuous Infusions: PRN Meds:.acetaminophen **OR** acetaminophen, albuterol, diphenhydrAMINE, ibuprofen, ondansetron **OR** ondansetron (ZOFRAN) IV   Assessment: Active Problems:   Bilateral lower leg cellulitis   C. difficile colitis    Plan: This patient has C. difficile colitis and has been doing better every day.  The white cell count has been going down to the patient's abdominal pain is improved as has her diarrhea.  Nothing further to do from a GI point of view.  We will continue to monitor with  you.   LOS: 11 days   Feather Berrie 11/22/2017, 2:22 PM

## 2017-11-22 NOTE — Progress Notes (Signed)
[place] [facility] [place] NAME: Heather Larson    MR#:  [id number]  DATE OF BIRTH:  1923/11/03  Patient had 3 loose stools last night though she did not sleep at all and she is tired now.  No abdominal pain, no fever.  Denies any shortness of breath.  Patient appears dehydrated.  CHIEF COMPLAINT:  No chief complaint on file. Marland Kitchen REVIEW OF SYSTEMS:  Review of Systems  Constitutional: Positive for malaise/fatigue. Negative for chills and fever.  HENT: Negative for sore throat.   Eyes: Negative for blurred vision and double vision.  Respiratory: Negative for cough, hemoptysis, sputum production, shortness of breath, wheezing and stridor.   Cardiovascular: Negative for chest pain, palpitations, orthopnea and leg swelling.  Gastrointestinal: Positive for diarrhea. Negative for abdominal pain, blood in stool, melena, nausea and vomiting.  Genitourinary: Negative for dysuria, flank pain and hematuria.  Musculoskeletal: Negative for back pain and joint pain.  Skin: Negative for itching and rash.       redness and swelling of both legs  Neurological: Negative for dizziness, sensory change, focal weakness, seizures, loss of consciousness, weakness and headaches.  Endo/Heme/Allergies: Negative for polydipsia.  Psychiatric/Behavioral: Negative for depression. The patient is not nervous/anxious.     DRUG ALLERGIES:  No Known Allergies VITALS:  Blood pressure 121/73, pulse 88, temperature 98.3 F (36.8 C), temperature source Oral, resp. rate 12, height 5\' 2"  (1.575 m), weight 54.9 kg (121 lb), SpO2 99 %. PHYSICAL EXAMINATION:  Physical Exam  Constitutional: She is oriented to person, place, and time. She appears well-developed.  HENT:  Head: Normocephalic.  Mouth/Throat: Oropharynx is clear and moist.  Eyes: Pupils are equal, round, and reactive to light. Conjunctivae and EOM are normal. No scleral icterus.  Neck: Normal range of motion. Neck supple. No  JVD present. No tracheal deviation present.  Cardiovascular: Normal rate, regular rhythm and normal heart sounds. Exam reveals no gallop.  No murmur heard. Pulmonary/Chest: Effort normal and breath sounds normal. No respiratory distress. She has no wheezes. She has no rales.  Abdominal: Soft. Bowel sounds are normal. She exhibits no distension. There is no tenderness. There is no rebound.  Musculoskeletal: Normal range of motion. She exhibits edema. She exhibits no tenderness.  Neurological: She is alert and oriented to person, place, and time. No cranial nerve deficit.  Skin: No rash noted. No erythema.  Still erythema and swelling of both legs, no erythema on lower abdomen.  Psychiatric: She has a normal mood and affect.  Vitals reviewed.  LABORATORY PANEL:  Female CBC Recent Labs  Lab 11/22/17 0908  WBC 17.0*  HGB 8.4*  HCT 25.1*  PLT 528*   ------------------------------------------------------------------------------------------------------------------ Chemistries  Recent Labs  Lab 11/17/17 0449 11/21/17 0416  NA 129*  --   K 3.9  --   CL 95*  --   CO2 26  --   GLUCOSE 98  --   BUN 17  --   CREATININE 0.73  --   CALCIUM 8.2*  --   AST  --  22  ALT  --  16  ALKPHOS  --  85  BILITOT  --  0.6   RADIOLOGY:  No results found. ASSESSMENT AND PLAN:  Heather Larson  is a 84 y.o. female with a known history of atrial fibrillation, chronic hyponatremia, chronic lower extremity edema, hypertension presents from independent living facility after her apartment manager and friend and found out that she had fever.   *  Sepsis due to B/L LE and lower abdominal wall cellulitis and strep bacteremia, on Ancef at this time.  Discontinue Ancef at this time, has chronic erythema, edema, seen by wound care nurse recommended to elevate the legs, patient needs compression device call [employer] they have removal and secured with Velcro wraps.  Elevate both legs until patient can afford [employer]  devices.  Lactic acidosis.  Improved with above treatment.  rptblood cultures are negative to date.  * pAfib.  Rapid ventricular rate due to sepsis.     Increased Toprol to 75 mg daily and continued Xarelto.    * HTN Iontinue Toprol.  Rate controlled.  Hyponatremia.  Improved to baseline with normal saline IV, sodium tablets p.o. patient does have chronic hyponatremia but sodium is improved to 129.  Hold the Lasix today due to diarrhea last night and also appears dehydrated.  spokewith patient's daughter Heather Larson.  Generalized weakness.  PT evaluation: HHPT.  Anxiety,, restlessness:   C. difficile colitis, continue p.o. vancomycin 125 mg p.o. every 6 hours for 14 days.  Seen by gastroenterology, patient had diarrhea last night but has more formed stool this morning.  WBC also down from 34.3 to 17;   disCussed with patient's daughter Heather Larson, updating her every day. Called patient's son-in-law Heather Larson [phone], left a message to call us.  More than 50% time spent in counseling, coordination of the care.  All the records are reviewed and case discussed with Care Management/Social Worker. Management plans discussed with the patient, her daughters and they are in agreement.  CODE STATUS: DNR  TOTAL TIME TAKING CARE OF THIS PATIENT: 42 minutes.   POSSIBLE D/C IN 2 DAYS, DEPENDING ON CLINICAL CONDITION.   Heather Larson M.D on 11/22/2017 at 12:45 PM  Between 7am to 6pm - Pager - [phone]  After 6pm go to [email or web address] - [facility]

## 2017-11-22 NOTE — Progress Notes (Signed)
Physical Therapy Treatment Patient Details Name: Heather Larson MRN: [id number] DOB: Sep 16, 1923 Today's Date: 11/22/2017    History of Present Illness 84 y.o. female with b/l LE cellulitis with PMH atrial fibrillation, chronic hyponatremia, chronic lower extremity edema, hypertension presents from independent living facility after her apartment manager and friend and found out that she had fever.      PT Comments    Pt reluctant to PT initially, but with encouragement from daughter, pt agrees. Pt demonstrates improving functional mobility with Mod I with bed mobility and supervision STS from bed; STS to raised commode requires cues and Min guard. Pt ambulation over 300 ft with Min guard, mild pauses for brief rest, but tolerable throughout. Family notes home situation has less walking distance pt required to perform to meals; would serve pt best to initially return with supervision to ensure safety. Pt assisted with set up and transfer to toilet. Pt left in nursing assistant care. Continue PT to progress strength and endurance to improve functional mobility to allow for return to PLOF.   Follow Up Recommendations  Home health PT;Supervision - Intermittent(initial 24 hour supervision)     Equipment Recommendations  None recommended by PT    Recommendations for Other Services       Precautions / Restrictions Precautions Precautions: Fall Restrictions Weight Bearing Restrictions: No    Mobility  Bed Mobility Overal bed mobility: Modified Independent Bed Mobility: Supine to Sit           General bed mobility comments: Performs well  Transfers Overall transfer level: Needs assistance Equipment used: Rolling walker (2 wheeled) Transfers: Sit to/from Stand Sit to Stand: Supervision;Min guard(Supervision from bed; Min guard to/from commode)         General transfer comment: Cues for chair approach and hand placement to commode  Ambulation/Gait Ambulation/Gait assistance:  Min guard Gait Distance (Feet): 355 Feet Assistive device: Rolling walker (2 wheeled) Gait Pattern/deviations: Step-through pattern;Trunk flexed(near baseline subjectively)   Gait velocity interpretation: <1.8 ft/sec, indicate of risk for recurrent falls General Gait Details: Demonstrates improved distance maintained btn pt and rw   Stairs             Wheelchair Mobility    Modified Rankin (Stroke Patients Only)       Balance Overall balance assessment: Needs assistance Sitting-balance support: Feet supported Sitting balance-Leahy Scale: Good     Standing balance support: Bilateral upper extremity supported Standing balance-Leahy Scale: Fair                              Cognition Arousal/Alertness: Awake/alert(fatigued) Behavior During Therapy: WFL for tasks assessed/performed Overall Cognitive Status: Within Functional Limits for tasks assessed                                        Exercises Other Exercises Other Exercises: Set up and transfer to raised commode in bathroom    General Comments        Pertinent Vitals/Pain Pain Assessment: No/denies pain    Home Living                      Prior Function            PT Goals (current goals can now be found in the care plan section) Progress towards PT goals: Progressing toward goals    Frequency  Min 2X/week      PT Plan Current plan remains appropriate    Co-evaluation              AM-PAC PT "6 Clicks" Daily Activity  Outcome Measure  Difficulty turning over in bed (including adjusting bedclothes, sheets and blankets)?: None Difficulty moving from lying on back to sitting on the side of the bed? : None Difficulty sitting down on and standing up from a chair with arms (e.g., wheelchair, bedside commode, etc,.)?: A Little Help needed moving to and from a bed to chair (including a wheelchair)?: A Little Help needed walking in hospital room?: A  Little Help needed climbing 3-5 steps with a railing? : A Lot 6 Click Score: 19    End of Session Equipment Utilized During Treatment: Gait belt Activity Tolerance: Patient tolerated treatment well Patient left: Other (comment)(in bathroom with nursing assistant )   PT Visit Diagnosis: Other abnormalities of gait and mobility (R26.89);Unsteadiness on feet (R26.81);Muscle weakness (generalized) (M62.81)     Time: 0034-9179 PT Time Calculation (min) (ACUTE ONLY): 31 min  Charges:  $Gait Training: 8-22 mins $Therapeutic Activity: 8-22 mins                      Larae Grooms, PTA 11/22/2017, 4:26 PM

## 2017-11-23 DIAGNOSIS — E440 Moderate protein-calorie malnutrition: Secondary | ICD-10-CM

## 2017-11-23 HISTORY — DX: Moderate protein-calorie malnutrition: E44.0

## 2017-11-23 LAB — CBC
HCT: 24.9 % — ABNORMAL LOW (ref 35.0–47.0)
HEMOGLOBIN: 8.4 g/dL — AB (ref 12.0–16.0)
MCH: 26.3 pg (ref 26.0–34.0)
MCHC: 33.7 g/dL (ref 32.0–36.0)
MCV: 78.1 fL — ABNORMAL LOW (ref 80.0–100.0)
PLATELETS: 509 10*3/uL — AB (ref 150–440)
RBC: 3.19 MIL/uL — AB (ref 3.80–5.20)
RDW: 15.6 % — ABNORMAL HIGH (ref 11.5–14.5)
WBC: 12.2 10*3/uL — AB (ref 3.6–11.0)

## 2017-11-23 NOTE — Care Management (Signed)
Physical therapy evaluation completed. Recommending home health PT ;supervision . Would like to have [facility]. [name] RN MSN CCM Care Management [phone]

## 2017-11-23 NOTE — Progress Notes (Signed)
[facility] NAME: Heather Larson    MR#:  [id number]  DATE OF BIRTH:  [birth year]    CHIEF COMPLAINT: Patient is still complaining of diarrhea which is getting worse and during evening times.  Last night patient had 5-6 watery bowel movements.  Reporting abdominal bloating and patient and 2 daughters have requested to talk to gastroenterology, concerned being contagious  REVIEW OF SYSTEMS:  Review of Systems  Constitutional: Positive for malaise/fatigue. Negative for chills and fever.  HENT: Negative for sore throat.   Eyes: Negative for blurred vision and double vision.  Respiratory: Negative for cough, hemoptysis, sputum production, shortness of breath, wheezing and stridor.   Cardiovascular: Negative for chest pain, palpitations, orthopnea and leg swelling.  Gastrointestinal: Positive for diarrhea. Negative for abdominal pain, blood in stool, melena, nausea and vomiting.  Genitourinary: Negative for dysuria, flank pain and hematuria.  Musculoskeletal: Negative for back pain and joint pain.  Skin: Negative for itching and rash.       redness and swelling of both legs  Neurological: Negative for dizziness, sensory change, focal weakness, seizures, loss of consciousness, weakness and headaches.  Endo/Heme/Allergies: Negative for polydipsia.  Psychiatric/Behavioral: Negative for depression. The patient is not nervous/anxious.     DRUG ALLERGIES:  No Known Allergies VITALS:  Blood pressure (!) 125/55, pulse 73, temperature 98.1 F (36.7 C), temperature source Oral, resp. rate 18, height 5\' 2"  (1.575 m), weight 54.9 kg (121 lb), SpO2 99 %. PHYSICAL EXAMINATION:  Physical Exam  Constitutional: She is oriented to person, place, and time. She appears well-developed.  HENT:  Head: Normocephalic.  Mouth/Throat: Oropharynx is clear and moist.  Eyes: Pupils are equal, round, and reactive to light. Conjunctivae and EOM are normal. No  scleral icterus.  Neck: Normal range of motion. Neck supple. No JVD present. No tracheal deviation present.  Cardiovascular: Normal rate, regular rhythm and normal heart sounds. Exam reveals no gallop.  No murmur heard. Pulmonary/Chest: Effort normal and breath sounds normal. No respiratory distress. She has no wheezes. She has no rales.  Abdominal: Soft. Bowel sounds are normal. She exhibits no distension. There is no tenderness. There is no rebound.  Musculoskeletal: Normal range of motion. She exhibits edema. She exhibits no tenderness.  Neurological: She is alert and oriented to person, place, and time. No cranial nerve deficit.  Skin: No rash noted. No erythema.  Still erythema and swelling of both legs, no erythema on lower abdomen.  Psychiatric: She has a normal mood and affect.  Vitals reviewed.  LABORATORY PANEL:  Female CBC Recent Labs  Lab 11/23/17 0414  WBC 12.2*  HGB 8.4*  HCT 24.9*  PLT 509*   ------------------------------------------------------------------------------------------------------------------ Chemistries  Recent Labs  Lab 11/17/17 0449 11/21/17 0416  NA 129*  --   K 3.9  --   CL 95*  --   CO2 26  --   GLUCOSE 98  --   BUN 17  --   CREATININE 0.73  --   CALCIUM 8.2*  --   AST  --  22  ALT  --  16  ALKPHOS  --  85  BILITOT  --  0.6   RADIOLOGY:  No results found. ASSESSMENT AND PLAN:  Heather Larson  is a 84 y.o. female with a known history of atrial fibrillation, chronic hyponatremia, chronic lower extremity edema, hypertension presents from independent living facility after her apartment manager and friend and found out that she had fever.   *  C. difficile colitis, continue p.o. vancomycin 125 mg p.o. every 6 hours for 14 days.  Seen by gastroenterology, patient is concerned as her diarrhea is getting worse during evening and night times.  GI is recommending to collect another stool sample  WBC also down from 34.3 to 17 to 12; ID is  recommending to continue p.o. Vancomycin,  to minimize other family members from exposure to cdifficile spores  - recommend to use soap and water for hand hygiene, dilligent hand hygiene for all family members/household members - recommend to use bleach wipes/household bleach to scrub/wipe down bathroom fixtures ( toilet seat, sink handles, door handles) daily - wear disposable gloves when cleaning; wash hands with soap and water afterwards - can also see patient education material - at [email or web address].             * Sepsis due to B/L LE and lower abdominal wall cellulitis and strep bacteremia, s/p  Ancef at this time.   Discontinue Ancef as pt has chronic erythema  seen by wound care nurse recommended to elevate the legs, patient needs compression device call [employer] they have removal and secured wire  Elevate both legs until patient can afford [employer] devices.  Lactic acidosis.  Improved with above treatment.  rptblood cultures are negative to date.  * pAfib.  Rapid ventricular rate due to sepsis.     Increased Toprol to 75 mg daily and continued Xarelto.    * HTN Iontinue Toprol.  Rate controlled.  Hyponatremia.  Improved to baseline with normal saline IV, sodium tablets p.o.   Generalized weakness.  PT evaluation: HHPT.  Anxiety,, restlessness:    disCussed with patient's daughter Shauna Hugh and Barbie at bed side   patient's son-in-law Dr.Snuffin [phone], left a message to call us.  More than 50% time spent in counseling, coordination of the care.  All the records are reviewed and case discussed with Care Management/Social Worker. Management plans discussed with the patient, her daughters and they are in agreement.  CODE STATUS: DNR  TOTAL TIME TAKING CARE OF THIS PATIENT: 42 minutes.   POSSIBLE D/C IN 2 DAYS, DEPENDING ON CLINICAL CONDITION.   Nicholes Mango M.D on 11/23/2017 at 10:19 PM  Between 7am to 6pm - Pager - [phone]  After 6pm go to  [email or web address] - [facility]

## 2017-11-23 NOTE — Progress Notes (Signed)
Pt ambulated around nurses station using walker with nurse tech. Tolerated well.

## 2017-11-23 NOTE — Progress Notes (Signed)
[facility]  Total days of antibiotics 13        Day 4       Reason for Consult: c.difficile     Referring Physician: gouru  Active Problems:   Bilateral lower leg cellulitis   C. difficile colitis   Malnutrition of moderate degree    HPI: Heather Larson is a 84 y.o. female with afib, admitted on 7/14 with fevers, leukocytosis, LE cellulitis with secondary streptococcal bacteremia. Originally treated with vanco and cefepime then narrowed to 3rd generation cephalosporin for 5 days with improvement in leukocytosis going from 24K to 15K. She was briefly changed to levofloxacin for 2 days possibly due to worsening leukocytosis that was then noted to be likely due to cdifficile. She has finished her treatment for bacteremia on 7/24. She was started on oral vancomycin on 7/22, with peak WBC of 34K, which has steadily improved down to 12K after 4 days of oral vancomycin. She is reported to starting to have some formed stools but less watery stools.  Family posed questions of the risk of contagiousness of cdifficile.   Past Medical History:  Diagnosis Date  . A-fib ([place])   . Bilateral lower extremity edema   . Cancer [facility])    breast cancer at age 78 and skin  . Cellulitis    lower abdomen  . Hyperlipidemia   . Hypertension   . Hypertension   . Incontinence   . Low sodium levels   . Macular degeneration     Allergies: No Known Allergies  MEDICATIONS: . acidophilus  2 capsule Oral TID  . atorvastatin  10 mg Oral Daily  . brimonidine  1 drop Both Eyes BID  . feeding supplement (ENSURE ENLIVE)  237 mL Oral BID BM  . hydrALAZINE  50 mg Oral Q8H  . meloxicam  7.5 mg Oral Daily  . metoprolol succinate  100 mg Oral Daily  . multivitamin with minerals  1 tablet Oral Daily  . Rivaroxaban  15 mg Oral Daily  . Travoprost (BAK Free)  1 drop Both Eyes QHS  . vancomycin  125 mg Oral Q6H    Social History   Tobacco Use  . Smoking status: Never Smoker    . Smokeless tobacco: Never Used  Substance Use Topics  . Alcohol use: Yes    Alcohol/week: 1.2 oz    Types: 2 Shots of liquor per week    Comment: 2 drinks daily  . Drug use: No    History reviewed. No pertinent family history.   OBJECTIVE: Temp:  [97.9 F (36.6 C)-98.1 F (36.7 C)] 98.1 F (36.7 C) (07/26 0803) Pulse Rate:  [30-120] 120 (07/26 0943) Resp:  [18] 18 (07/26 0803) BP: (116-142)/(55-88) 142/88 (07/26 0943) SpO2:  [98 %-99 %] 99 % (07/26 0803)   LABS: Results for orders placed or performed during the hospital encounter of 11/11/17 (from the past 48 hour(s))  CBC     Status: Abnormal   Collection Time: 11/22/17  9:08 AM  Result Value Ref Range   WBC 17.0 (H) 3.6 - 11.0 K/uL   RBC 3.23 (L) 3.80 - 5.20 MIL/uL   Hemoglobin 8.4 (L) 12.0 - 16.0 g/dL   HCT 25.1 (L) 35.0 - 47.0 %   MCV 77.7 (L) 80.0 - 100.0 fL   MCH 26.1 26.0 - 34.0 pg   MCHC 33.6 32.0 - 36.0 g/dL   RDW 15.9 (H) 11.5 - 14.5 %   Platelets 528 (H)  150 - 440 K/uL    Comment: Performed at [facility], [address] [place], [place] [address]  CBC     Status: Abnormal   Collection Time: 11/23/17  4:14 AM  Result Value Ref Range   WBC 12.2 (H) 3.6 - 11.0 K/uL   RBC 3.19 (L) 3.80 - 5.20 MIL/uL   Hemoglobin 8.4 (L) 12.0 - 16.0 g/dL   HCT 24.9 (L) 35.0 - 47.0 %   MCV 78.1 (L) 80.0 - 100.0 fL   MCH 26.3 26.0 - 34.0 pg   MCHC 33.7 32.0 - 36.0 g/dL   RDW 15.6 (H) 11.5 - 14.5 %   Platelets 509 (H) 150 - 440 K/uL    Comment: Performed at [facility], [address], [place], [place] [address]    Assessment/Plan:  84yo F with afib, admitted for fever, leukocytosis with cellulitis with secondary streptococcal bacteremia who has developed c.difficile colitis - showing signs of improvement while on oral vanco.  - recommend to continue with oral vancomycin 125mg  QID. Would recommend to treat for 10days using day 1 as 7/25.  - to minimize other family members from exposure to  cdifficile spores  - recommend to use soap and water for hand hygiene, dilligent hand hygiene for all family members/household members - recommend to use bleach wipes/household bleach to scrub/wipe down bathroom fixtures ( toilet seat, sink handles, door handles) daily - wear disposable gloves when cleaning; wash hands with soap and water afterwards - can also see patient education material - at [email or web address].

## 2017-11-23 NOTE — Progress Notes (Signed)
Patient's daughter approached Clinical Social Worker (CSW) and stated that she would like [facility] instead of [facility] because [facility] is Heather Larson's preferred home health agency. CSW emphasized to daughter that it is her choice of home health agency. Daughter verbalized her understanding and chose [facility]. RN case manager aware of above.   [employer], LCSW [phone]

## 2017-11-23 NOTE — Progress Notes (Signed)
Heather Lame, MD [facility]   [address], [place], [place] [address] Phone: [phone] Fax : [phone]   Subjective: This patient is being treated for C. Difficile colitis.  The patient had a extremely high white cell count that has come down. The patient's stools have also become less watery and less frequent.   Objective: Vital signs in last 24 hours: Vitals:   11/23/17 0040 11/23/17 0803 11/23/17 0943 11/23/17 1503  BP: (!) 121/55 128/76 (!) 142/88 (!) 125/55  Pulse: (!) 115 (!) 30 (!) 120 73  Resp:  18    Temp: 98 F (36.7 C) 98.1 F (36.7 C)    TempSrc:  Oral    SpO2: 98% 99%    Weight:      Height:       Weight change:  No intake or output data in the 24 hours ending 11/23/17 2115   Exam: Heart:: Regular rate and rhythm, S1S2 present or without murmur or extra heart sounds Lungs: normal and clear to auscultation and percussion Abdomen: Distended with positive bowel sounds and tympanic   Lab Results: @LABTEST2 @ Micro Results: Recent Results (from the past 240 hour(s))  CULTURE, BLOOD (ROUTINE X 2) w Reflex to ID Panel     Status: None   Collection Time: 11/17/17  9:20 AM  Result Value Ref Range Status   Specimen Description BLOOD Blood Culture adequate volume  Final   Special Requests   Final    BOTTLES DRAWN AEROBIC AND ANAEROBIC BLOOD LEFT HAND   Culture   Final    NO GROWTH 5 DAYS Performed at [facility], [address] [place], [place] [address]    Report Status 11/22/2017 FINAL  Final  CULTURE, BLOOD (ROUTINE X 2) w Reflex to ID Panel     Status: None   Collection Time: 11/17/17  9:32 AM  Result Value Ref Range Status   Specimen Description BLOOD Blood Culture adequate volume  Final   Special Requests   Final    BOTTLES DRAWN AEROBIC AND ANAEROBIC RESISTANT WRIST   Culture   Final    NO GROWTH 5 DAYS Performed at [facility], [address] [place], [place] [address]    Report Status 11/22/2017 FINAL  Final  C  difficile quick scan w PCR reflex     Status: Abnormal   Collection Time: 11/19/17 11:45 AM  Result Value Ref Range Status   C Diff antigen POSITIVE (A) NEGATIVE Final    Comment: RESULT CALLED TO, READ BACK BY AND VERIFIED WITH: Heather Larson @1350  11/19/17 AKT    C Diff toxin POSITIVE (A) NEGATIVE Final    Comment: RESULT CALLED TO, READ BACK BY AND VERIFIED WITH: Heather Larson @1350  11/19/17 AKT    C Diff interpretation Toxin producing C. difficile detected.  Final    Comment: RESULT CALLED TO, READ BACK BY AND VERIFIED WITH: Heather Larson @1350  11/19/17 AKT Performed at [facility], [address] [place], [place] [address]    Studies/Results: No results found. Medications: I have reviewed the patient's current medications. Scheduled Meds: . acidophilus  2 capsule Oral TID  . atorvastatin  10 mg Oral Daily  . brimonidine  1 drop Both Eyes BID  . feeding supplement (ENSURE ENLIVE)  237 mL Oral BID BM  . hydrALAZINE  50 mg Oral Q8H  . meloxicam  7.5 mg Oral Daily  . metoprolol succinate  100 mg Oral Daily  . multivitamin with minerals  1 tablet Oral  Daily  . Rivaroxaban  15 mg Oral Daily  . Travoprost (BAK Free)  1 drop Both Eyes QHS  . vancomycin  125 mg Oral Q6H   Continuous Infusions: PRN Meds:.acetaminophen **OR** acetaminophen, albuterol, diphenhydrAMINE, ibuprofen, ondansetron **OR** ondansetron (ZOFRAN) IV   Assessment: Active Problems:   Bilateral lower leg cellulitis   C. difficile colitis   Malnutrition of moderate degree    Plan: This patient had C. Difficile colitis with improvement wall on vancomycin 125 mg 4 times a day.  The patient hasn't noted the chart by infectious disease. The patient's main concern is going back to the assisted living facility without being contagious. The patient has been told to walk around and tried to mobilize the gas while keeping her strength up. The patient is slowly getting better and continue supportive care is  recommended.   LOS: 12 days   [name] 11/23/2017, 9:15 PM

## 2017-11-24 ENCOUNTER — Inpatient Hospital Stay: Payer: Medicare Other

## 2017-11-24 LAB — CBC
HCT: 24.2 % — ABNORMAL LOW (ref 35.0–47.0)
HEMOGLOBIN: 8.3 g/dL — AB (ref 12.0–16.0)
MCH: 26.3 pg (ref 26.0–34.0)
MCHC: 34.1 g/dL (ref 32.0–36.0)
MCV: 77.1 fL — ABNORMAL LOW (ref 80.0–100.0)
Platelets: 610 10*3/uL — ABNORMAL HIGH (ref 150–440)
RBC: 3.14 MIL/uL — ABNORMAL LOW (ref 3.80–5.20)
RDW: 15.3 % — ABNORMAL HIGH (ref 11.5–14.5)
WBC: 13 10*3/uL — AB (ref 3.6–11.0)

## 2017-11-24 LAB — BASIC METABOLIC PANEL
ANION GAP: 7 (ref 5–15)
BUN: 27 mg/dL — ABNORMAL HIGH (ref 8–23)
CHLORIDE: 97 mmol/L — AB (ref 98–111)
CO2: 26 mmol/L (ref 22–32)
CREATININE: 0.81 mg/dL (ref 0.44–1.00)
Calcium: 8.4 mg/dL — ABNORMAL LOW (ref 8.9–10.3)
GFR calc non Af Amer: >60 mL/min (ref >60)
Glucose, Bld: 95 mg/dL (ref 70–99)
Potassium: 4.5 mmol/L (ref 3.5–5.1)
SODIUM: 130 mmol/L — AB (ref 135–145)

## 2017-11-24 LAB — C DIFFICILE QUICK SCREEN W PCR REFLEX
C DIFFICILE (CDIFF) TOXIN: NEGATIVE
C Diff antigen: POSITIVE — AB

## 2017-11-24 LAB — MAGNESIUM: MAGNESIUM: 1.8 mg/dL (ref 1.7–2.4)

## 2017-11-24 LAB — CLOSTRIDIUM DIFFICILE BY PCR, REFLEXED: CDIFFPCR: POSITIVE — AB

## 2017-11-24 MED ORDER — METRONIDAZOLE IN NACL 5-0.79 MG/ML-% IV SOLN
500.0000 mg | Freq: Three times a day (TID) | INTRAVENOUS | Status: DC
  Administered 2017-11-24 – 2017-11-27 (×9): 500 mg via INTRAVENOUS
  Filled 2017-11-24 (×11): qty 100

## 2017-11-24 MED ORDER — SODIUM CHLORIDE 0.9 % IV SOLN
INTRAVENOUS | Status: DC
  Administered 2017-11-24: 13:00:00 via INTRAVENOUS

## 2017-11-24 NOTE — Progress Notes (Signed)
Heather Darby, MD [address]  [place]  [place], [place] [address]  Main: [phone]  Fax: [phone] Pager: [phone]   Subjective: Patient is sitting up in chair. She continues to have watery stools during night. She has to wake up 2-3 times to have a bowel movement. She denies any diarrhea during the day. She reports abdominal distention, denies any abdominal pain, nausea and vomiting.she is tolerating diet well. Her daughter is bedside who wants to know when patient can be discharged as she appears to be frustrated. She has been afebrile. Patient reports that overall her diarrhea has improved since starting vancomycin.  Objective: Vital signs in last 24 hours: Vitals:   11/23/17 1503 11/23/17 2332 11/24/17 0500 11/24/17 0801  BP: (!) 125/55 119/63  133/83  Pulse: 73 (!) 101  (!) 122  Resp:  18    Temp:  97.9 F (36.6 C)  97.7 F (36.5 C)  TempSrc:  Oral  Oral  SpO2:  97%  98%  Weight:   120 lb (54.4 kg)   Height:       Weight change:   Intake/Output Summary (Last 24 hours) at 11/24/2017 1549 Last data filed at 11/24/2017 1100 Gross per 24 hour  Intake -  Output 6 ml  Net -6 ml     Exam: Heart:: Regular rate and rhythm or S1S2 present Lungs: clear to auscultation Abdomen: soft, nontender,moderately distended, normal bowel sounds   Lab Results: CBC Latest Ref Rng & Units 11/24/2017 11/23/2017 11/22/2017  WBC 3.6 - 11.0 K/uL 13.0(H) 12.2(H) 17.0(H)  Hemoglobin 12.0 - 16.0 g/dL 8.3(L) 8.4(L) 8.4(L)  Hematocrit 35.0 - 47.0 % 24.2(L) 24.9(L) 25.1(L)  Platelets 150 - 440 K/uL 610(H) 509(H) 528(H)   BMP Latest Ref Rng & Units 11/24/2017 11/17/2017 11/16/2017  Glucose 70 - 99 mg/dL 95 98 117(H)  BUN 8 - 23 mg/dL 27(H) 17 15  Creatinine 0.44 - 1.00 mg/dL 0.81 0.73 0.58  Sodium 135 - 145 mmol/L 130(L) 129(L) 128(L)  Potassium 3.5 - 5.1 mmol/L 4.5 3.9 4.0  Chloride 98 - 111 mmol/L 97(L) 95(L) 96(L)  CO2 22 - 32 mmol/L 26 26 26   Calcium 8.9 - 10.3 mg/dL  8.4(L) 8.2(L) 8.2(L)   Micro Results: Recent Results (from the past 240 hour(s))  CULTURE, BLOOD (ROUTINE X 2) w Reflex to ID Panel     Status: None   Collection Time: 11/17/17  9:20 AM  Result Value Ref Range Status   Specimen Description BLOOD Blood Culture adequate volume  Final   Special Requests   Final    BOTTLES DRAWN AEROBIC AND ANAEROBIC BLOOD LEFT HAND   Culture   Final    NO GROWTH 5 DAYS Performed at [facility], [address] [place], [place] [address]    Report Status 11/22/2017 FINAL  Final  CULTURE, BLOOD (ROUTINE X 2) w Reflex to ID Panel     Status: None   Collection Time: 11/17/17  9:32 AM  Result Value Ref Range Status   Specimen Description BLOOD Blood Culture adequate volume  Final   Special Requests   Final    BOTTLES DRAWN AEROBIC AND ANAEROBIC RESISTANT WRIST   Culture   Final    NO GROWTH 5 DAYS Performed at [facility], [address] [place],  [address]    Report Status 11/22/2017 FINAL  Final  C difficile quick scan w PCR reflex     Status: Abnormal   Collection Time: 11/19/17 11:45 AM  Result Value Ref Range Status   C Diff antigen POSITIVE (A) NEGATIVE Final    Comment: RESULT CALLED TO, READ BACK BY AND VERIFIED WITH: CORY HUDSON @1350  11/19/17 AKT    C Diff toxin POSITIVE (A) NEGATIVE Final    Comment: RESULT CALLED TO, READ BACK BY AND VERIFIED WITH: CORY HUDSON @1350  11/19/17 AKT    C Diff interpretation Toxin producing C. difficile detected.  Final    Comment: RESULT CALLED TO, READ BACK BY AND VERIFIED WITH: CORY HUDSON @1350  11/19/17 AKT Performed at [facility], [address] [place], [place] [address]   C difficile quick scan w PCR reflex     Status: Abnormal   Collection Time: 11/23/17  7:38 PM  Result Value Ref Range Status   C Diff antigen POSITIVE (A) NEGATIVE Final   C Diff toxin NEGATIVE NEGATIVE Final   C Diff interpretation Results are indeterminate. See PCR results.  Final     Comment: Performed at [facility], [address] [place], [place] [address]  C. Diff by PCR, Reflexed     Status: Abnormal   Collection Time: 11/23/17  7:38 PM  Result Value Ref Range Status   Toxigenic C. Difficile by PCR POSITIVE (A) NEGATIVE Final    Comment: Positive for toxigenic C. difficile with little to no toxin production. Only treat if clinical presentation suggests symptomatic illness. Performed at [facility], [address] [place], [place] [address]    Studies/Results: No results found. Medications: I have reviewed the patient's current medications. Scheduled Meds: . acidophilus  2 capsule Oral TID  . atorvastatin  10 mg Oral Daily  . brimonidine  1 drop Both Eyes BID  . feeding supplement (ENSURE ENLIVE)  237 mL Oral BID BM  . hydrALAZINE  50 mg Oral Q8H  . meloxicam  7.5 mg Oral Daily  . metoprolol succinate  100 mg Oral Daily  . multivitamin with minerals  1 tablet Oral Daily  . Rivaroxaban  15 mg Oral Daily  . Travoprost (BAK Free)  1 drop Both Eyes QHS  . vancomycin  125 mg Oral Q6H   Continuous Infusions: . sodium chloride 75 mL/hr at 11/24/17 1326  . metronidazole     PRN Meds:.acetaminophen **OR** acetaminophen, albuterol, diphenhydrAMINE, ibuprofen, ondansetron **OR** ondansetron (ZOFRAN) IV   Assessment: Active Problems:   Bilateral lower leg cellulitis   C. difficile colitis   Malnutrition of moderate degree  Chronic mild hyponatremia C. Difficile colitis with ileus, currently on vancomycin Diarrhea has significantly improved, still having nocturnal diarrhea Ongoing abdominal distention  Plan: X-ray KUB Recommend to add IV metronidazole 500 mg every 8 hours along with oral vancomycin for 14 days total as patient has severe C. Difficile colitis Patient has chronic mild hyponatremia, recommend to check a.m. Cortisol levels Can discontinue IV fluids Encouraged to drink less water and more electrolyte solution like  Gatorade or electral Okay to check labs every other day Encourage physical activity as tolerated Addressed all the questions and concerns expressed per patient and her daughter  Will follow along with you   LOS: 13 days   Kymberlee Viger 11/24/2017, 3:49 PM

## 2017-11-24 NOTE — Progress Notes (Signed)
[facility] [place] NAME: Heather Larson    MR#:  [id number]  DATE OF BIRTH:  1923/05/27    CHIEF COMPLAINT: Patient is reporting worsening of diarrhea which is getting worse during evening times.  Last night patient had watery bowel movements almost every hour, reporting abdominal bloating. Feels wornout and dry REVIEW OF SYSTEMS:  Review of Systems  Constitutional: Positive for malaise/fatigue. Negative for chills and fever.  HENT: Negative for sore throat.   Eyes: Negative for blurred vision and double vision.  Respiratory: Negative for cough, hemoptysis, sputum production, shortness of breath, wheezing and stridor.   Cardiovascular: Negative for chest pain, palpitations, orthopnea and leg swelling.  Gastrointestinal: Positive for diarrhea. Negative for abdominal pain, blood in stool, melena, nausea and vomiting.  Genitourinary: Negative for dysuria, flank pain and hematuria.  Musculoskeletal: Negative for back pain and joint pain.  Skin: Negative for itching and rash.       redness and swelling of both legs  Neurological: Negative for dizziness, sensory change, focal weakness, seizures, loss of consciousness, weakness and headaches.  Endo/Heme/Allergies: Negative for polydipsia.  Psychiatric/Behavioral: Negative for depression. The patient is not nervous/anxious.     DRUG ALLERGIES:  No Known Allergies VITALS:  Blood pressure 133/83, pulse (!) 122, temperature 97.7 F (36.5 C), temperature source Oral, resp. rate 18, height 5\' 2"  (1.575 m), weight 54.4 kg (120 lb), SpO2 98 %. PHYSICAL EXAMINATION:  Physical Exam  Constitutional: She is oriented to person, place, and time. She appears well-developed.  HENT:  Head: Normocephalic.  Mouth/Throat: Oropharynx is clear and moist.  Eyes: Pupils are equal, round, and reactive to light. Conjunctivae and EOM are normal. No scleral icterus.  Neck: Normal range of motion. Neck supple. No JVD  present. No tracheal deviation present.  Cardiovascular: Normal rate, regular rhythm and normal heart sounds. Exam reveals no gallop.  No murmur heard. Pulmonary/Chest: Effort normal and breath sounds normal. No respiratory distress. She has no wheezes. She has no rales.  Abdominal: Soft. Bowel sounds are normal. She exhibits no distension. There is no tenderness. There is no rebound.  Musculoskeletal: Normal range of motion. She exhibits edema. She exhibits no tenderness.  Neurological: She is alert and oriented to person, place, and time. No cranial nerve deficit.  Skin: No rash noted. No erythema.  Still erythema and swelling of both legs, no erythema on lower abdomen.  Psychiatric: She has a normal mood and affect.  Vitals reviewed.  LABORATORY PANEL:  Female CBC Recent Labs  Lab 11/24/17 0452  WBC 13.0*  HGB 8.3*  HCT 24.2*  PLT 610*   ------------------------------------------------------------------------------------------------------------------ Chemistries  Recent Labs  Lab 11/21/17 0416 11/24/17 0452  NA  --  130*  K  --  4.5  CL  --  97*  CO2  --  26  GLUCOSE  --  95  BUN  --  27*  CREATININE  --  0.81  CALCIUM  --  8.4*  MG  --  1.8  AST 22  --   ALT 16  --   ALKPHOS 85  --   BILITOT 0.6  --    RADIOLOGY:  No results found. ASSESSMENT AND PLAN:  Heather Larson  is a 84 y.o. female with a known history of atrial fibrillation, chronic hyponatremia, chronic lower extremity edema, hypertension presents from independent living facility after her apartment manager and friend and found out that she had fever.   * C. difficile colitis,  clinically worsening  Flagyl 500 mg IV every 8 hours is added to the regimen by GI  continue p.o. vancomycin 125 mg p.o. every 6 hours for 14 days.  Gentle hydration with IV fluids as the patient's diarrhea is getting worse Repeat stool sample is positive for C. difficile toxin    WBC also down from 34.3 to 17 to 12-13 On  probiotics ID is recommending  to minimize other family members from exposure to cdifficile spores  - recommend to use soap and water for hand hygiene, dilligent hand hygiene for all family members/household members - recommend to use bleach wipes/household bleach to scrub/wipe down bathroom fixtures ( toilet seat, sink handles, door handles) daily - wear disposable gloves when cleaning; wash hands with soap and water afterwards - can also see patient education material - at [email or web address].             * Sepsis due to B/L LE and lower abdominal wall cellulitis and strep bacteremia, s/p  Ancef at this time.   Discontinue Ancef as pt has chronic erythema  seen by wound care nurse recommended to elevate the legs, patient seems to be noncompliant with leg elevation  patient needs compression device call [employer] they have removal and secured wire, encouraged patient to elevate both legs until patient can afford [employer] devices.  Lactic acidosis.  Improved with above treatment.  rptblood cultures are negative to date.  * pAfib.  Rapid ventricular rate due to sepsis.     Increased Toprol to 75 mg daily and continued Xarelto.    * HTN Iontinue Toprol.  Rate controlled.  Hyponatremia.  Improved to baseline with normal saline IV, sodium tablets p.o.   Generalized weakness.  PT evaluation: HHPT.  Anxiety,, restlessness:    disCussed with patient's daughter Shauna Hugh and Barbie at bed side   patient's son-in-law Dr.Snuffin [phone], left a message to call us.  More than 50% time spent in counseling, coordination of the care.  All the records are reviewed and case discussed with Care Management/Social Worker. Management plans discussed with the patient, her daughters and they are in agreement.  CODE STATUS: DNR  TOTAL TIME TAKING CARE OF THIS PATIENT: 42 minutes.   POSSIBLE D/C IN 2 DAYS, DEPENDING ON CLINICAL CONDITION.   Nicholes Mango M.D on 11/24/2017 at 3:08  PM  Between 7am to 6pm - Pager - [phone]  After 6pm go to [email or web address] - [facility]

## 2017-11-25 LAB — CBC
HCT: 23.7 % — ABNORMAL LOW (ref 35.0–47.0)
Hemoglobin: 8.2 g/dL — ABNORMAL LOW (ref 12.0–16.0)
MCH: 26.8 pg (ref 26.0–34.0)
MCHC: 34.5 g/dL (ref 32.0–36.0)
MCV: 77.6 fL — AB (ref 80.0–100.0)
Platelets: 618 10*3/uL — ABNORMAL HIGH (ref 150–440)
RBC: 3.05 MIL/uL — AB (ref 3.80–5.20)
RDW: 15.9 % — AB (ref 11.5–14.5)
WBC: 11.7 10*3/uL — ABNORMAL HIGH (ref 3.6–11.0)

## 2017-11-25 LAB — CORTISOL: Cortisol, Plasma: 8.8 ug/dL

## 2017-11-25 NOTE — Progress Notes (Signed)
[facility] [place] NAME: Heather Larson    MR#:  [id number]  DATE OF BIRTH:  27-Feb-1924    CHIEF COMPLAINT: Patient reports her diarrhea is slightly better, still feeling bloated  Feels wornout and dry REVIEW OF SYSTEMS:  Review of Systems  Constitutional: Positive for malaise/fatigue. Negative for chills and fever.  HENT: Negative for sore throat.   Eyes: Negative for blurred vision and double vision.  Respiratory: Negative for cough, hemoptysis, sputum production, shortness of breath, wheezing and stridor.   Cardiovascular: Negative for chest pain, palpitations, orthopnea and leg swelling.  Gastrointestinal: Positive for diarrhea. Negative for abdominal pain, blood in stool, melena, nausea and vomiting.  Genitourinary: Negative for dysuria, flank pain and hematuria.  Musculoskeletal: Negative for back pain and joint pain.  Skin: Negative for itching and rash.       redness and swelling of both legs  Neurological: Negative for dizziness, sensory change, focal weakness, seizures, loss of consciousness, weakness and headaches.  Endo/Heme/Allergies: Negative for polydipsia.  Psychiatric/Behavioral: Negative for depression. The patient is not nervous/anxious.     DRUG ALLERGIES:  No Known Allergies VITALS:  Blood pressure (!) 143/70, pulse (!) 113, temperature 97.8 F (36.6 C), temperature source Oral, resp. rate 18, height 5\' 2"  (1.575 m), weight 50.8 kg (112 lb), SpO2 99 %. PHYSICAL EXAMINATION:  Physical Exam  Constitutional: She is oriented to person, place, and time. She appears well-developed.  HENT:  Head: Normocephalic.  Mouth/Throat: Oropharynx is clear and moist.  Eyes: Pupils are equal, round, and reactive to light. Conjunctivae and EOM are normal. No scleral icterus.  Neck: Normal range of motion. Neck supple. No JVD present. No tracheal deviation present.  Cardiovascular: Normal rate, regular rhythm and normal heart  sounds. Exam reveals no gallop.  No murmur heard. Pulmonary/Chest: Effort normal and breath sounds normal. No respiratory distress. She has no wheezes. She has no rales.  Abdominal: Soft. Bowel sounds are normal. She exhibits no distension. There is no tenderness. There is no rebound.  Musculoskeletal: Normal range of motion. She exhibits edema. She exhibits no tenderness.  Neurological: She is alert and oriented to person, place, and time. No cranial nerve deficit.  Skin: No rash noted. No erythema.  Still erythema and swelling of both legs, no erythema on lower abdomen.  Psychiatric: She has a normal mood and affect.  Vitals reviewed.  LABORATORY PANEL:  Female CBC Recent Labs  Lab 11/25/17 0519  WBC 11.7*  HGB 8.2*  HCT 23.7*  PLT 618*   ------------------------------------------------------------------------------------------------------------------ Chemistries  Recent Labs  Lab 11/21/17 0416 11/24/17 0452  NA  --  130*  K  --  4.5  CL  --  97*  CO2  --  26  GLUCOSE  --  95  BUN  --  27*  CREATININE  --  0.81  CALCIUM  --  8.4*  MG  --  1.8  AST 22  --   ALT 16  --   ALKPHOS 85  --   BILITOT 0.6  --    RADIOLOGY:  Dg Abd 1 View  Result Date: 11/24/2017 CLINICAL DATA:  C difficile colitis.  Sepsis EXAM: ABDOMEN - 1 VIEW COMPARISON:  None. FINDINGS: Nonobstructive bowel gas pattern. No free air or organomegaly. Extensive aortic and iliac calcifications. No visible aneurysm. No acute bony abnormality. Degenerative changes in the thoracic and lumbar spine. IMPRESSION: No evidence of bowel obstruction or free air. Aortic atherosclerosis. Electronically Signed  By: Rolm Baptise M.D.   On: 11/24/2017 16:28   ASSESSMENT AND PLAN:  Heather Larson  is a 84 y.o. female with a known history of atrial fibrillation, chronic hyponatremia, chronic lower extremity edema, hypertension presents from independent living facility after her apartment manager and friend and found out that  she had fever.   * C. difficile colitis, clinically worsening  Flagyl 500 mg IV every 8 hours is added to the regimen by GI  continue p.o. vancomycin 125 mg p.o. every 6 hours for 14 days.  If she is still having frequent episodes might consider adding Imodium tomorrow Gentle hydration with IV fluids as the patient's diarrhea is getting worse Repeat stool sample is positive for C. difficile toxin    WBC also down from 34.3 to 17 to 12-13--11.7 On probiotics ID is recommending  to minimize other family members from exposure to cdifficile spores  - recommend to use soap and water for hand hygiene, dilligent hand hygiene for all family members/household members - recommend to use bleach wipes/household bleach to scrub/wipe down bathroom fixtures ( toilet seat, sink handles, door handles) daily - wear disposable gloves when cleaning; wash hands with soap and water afterwards - can also see patient education material - at [email or web address].             * Sepsis due to B/L LE and lower abdominal wall cellulitis and strep bacteremia, s/p  Ancef at this time.   Discontinue Ancef as pt has chronic erythema  seen by wound care nurse recommended to elevate the legs, patient seems to be noncompliant with leg elevation  patient needs compression device call [employer] they have removal and secured wire, encouraged patient to elevate both legs until patient can afford [employer] devices.  Lactic acidosis.  Improved with above treatment.  rptblood cultures are negative to date.  * pAfib.  Rapid ventricular rate due to sepsis.     Increased Toprol to 75 mg daily and continued Xarelto.    * HTN Iontinue Toprol.  Rate controlled.  Hyponatremia.  Improved to baseline with normal saline IV, and sodium tablets p.o.   Generalized weakness.  PT evaluation: HHPT.  Out of bed to ambulate as  tolerated  Anxiety,, restlessness:    disCussed with patient's daughter Heather Larson [phone] over  phone  patient's son-in-law Heather Larson [phone]  More than 50% time spent in counseling, coordination of the care.  All the records are reviewed and case discussed with Care Management/Social Worker. Management plans discussed with the patient, her daughters and they are in agreement.  CODE STATUS: DNR  TOTAL TIME TAKING CARE OF THIS PATIENT: 36 minutes.   POSSIBLE D/C IN 2 DAYS, DEPENDING ON CLINICAL CONDITION.   Nicholes Mango M.D on 11/25/2017 at 1:43 PM  Between 7am to 6pm - Pager - [phone]  After 6pm go to [email or web address] - [employer] [facility]

## 2017-11-26 MED ORDER — SODIUM CHLORIDE 1 G PO TABS
1.0000 g | ORAL_TABLET | Freq: Two times a day (BID) | ORAL | Status: DC
  Administered 2017-11-26 – 2017-11-27 (×3): 1 g via ORAL
  Filled 2017-11-26 (×3): qty 1

## 2017-11-26 NOTE — Progress Notes (Signed)
[facility] at [place] NAME: Heather Larson    MR#:  [id number]  DATE OF BIRTH:  1923/07/15    CHIEF COMPLAINT: Patient reports her diarrhea is  better, less bloating and less abdominal discomfort .  Daughter at bedside.  Anticipating that patient will be discharged tomorrow REVIEW OF SYSTEMS:  Review of Systems  Constitutional: Positive for malaise/fatigue. Negative for chills and fever.  HENT: Negative for sore throat.   Eyes: Negative for blurred vision and double vision.  Respiratory: Negative for cough, hemoptysis, sputum production, shortness of breath, wheezing and stridor.   Cardiovascular: Negative for chest pain, palpitations, orthopnea and leg swelling.  Gastrointestinal: Positive for diarrhea. Negative for abdominal pain, blood in stool, melena, nausea and vomiting.  Genitourinary: Negative for dysuria, flank pain and hematuria.  Musculoskeletal: Negative for back pain and joint pain.  Skin: Negative for itching and rash.       redness and swelling of both legs  Neurological: Negative for dizziness, sensory change, focal weakness, seizures, loss of consciousness, weakness and headaches.  Endo/Heme/Allergies: Negative for polydipsia.  Psychiatric/Behavioral: Negative for depression. The patient is not nervous/anxious.     DRUG ALLERGIES:  No Known Allergies VITALS:  Blood pressure 130/78, pulse 88, temperature 98.1 F (36.7 C), temperature source Oral, resp. rate 15, height 5\' 2"  (1.575 m), weight 51.9 kg (114 lb 8 oz), SpO2 99 %. PHYSICAL EXAMINATION:  Physical Exam  Constitutional: She is oriented to person, place, and time. She appears well-developed.  HENT:  Head: Normocephalic.  Mouth/Throat: Oropharynx is clear and moist.  Eyes: Pupils are equal, round, and reactive to light. Conjunctivae and EOM are normal. No scleral icterus.  Neck: Normal range of motion. Neck supple. No JVD present. No tracheal deviation present.    Cardiovascular: Normal rate, regular rhythm and normal heart sounds. Exam reveals no gallop.  No murmur heard. Pulmonary/Chest: Effort normal and breath sounds normal. No respiratory distress. She has no wheezes. She has no rales.  Abdominal: Soft. Bowel sounds are normal. She exhibits no distension. There is no tenderness. There is no rebound.  Musculoskeletal: Normal range of motion. She exhibits edema. She exhibits no tenderness.  Neurological: She is alert and oriented to person, place, and time. No cranial nerve deficit.  Skin: No rash noted. No erythema.  Still erythema and swelling of both legs, no erythema on lower abdomen.  Psychiatric: She has a normal mood and affect.  Vitals reviewed.  LABORATORY PANEL:  Female CBC Recent Labs  Lab 11/25/17 0519  WBC 11.7*  HGB 8.2*  HCT 23.7*  PLT 618*   ------------------------------------------------------------------------------------------------------------------ Chemistries  Recent Labs  Lab 11/21/17 0416 11/24/17 0452  NA  --  130*  K  --  4.5  CL  --  97*  CO2  --  26  GLUCOSE  --  95  BUN  --  27*  CREATININE  --  0.81  CALCIUM  --  8.4*  MG  --  1.8  AST 22  --   ALT 16  --   ALKPHOS 85  --   BILITOT 0.6  --    RADIOLOGY:  No results found. ASSESSMENT AND PLAN:  Heather Larson  is a 84 y.o. female with a known history of atrial fibrillation, chronic hyponatremia, chronic lower extremity edema, hypertension presents from independent living facility after her apartment manager and friend and found out that she had fever.   * C. difficile colitis, clinically much better  Less frequency of diarrhea, more formed stool and less bloating Flagyl 500 mg IV every 8 hours is added to the regimen by GI  continue p.o. vancomycin 125 mg p.o. every 6 hours for 14 days.  Repeat stool sample is positive for C. difficile toxin    WBC also down from 34.3 to 17 to 12-13--11.7 On probiotics ID is recommending  to minimize other  family members from exposure to cdifficile spores  - recommend to use soap and water for hand hygiene, dilligent hand hygiene for all family members/household members - recommend to use bleach wipes/household bleach to scrub/wipe down bathroom fixtures ( toilet seat, sink handles, door handles) daily - wear disposable gloves when cleaning; wash hands with soap and water afterwards - can also see patient education material - at [email or web address].             * Sepsis due to B/L LE and lower abdominal wall cellulitis and strep bacteremia, s/p  Ancef at this time.   Discontinue Ancef as pt has chronic erythema  seen by wound care nurse recommended to elevate the legs, patient seems to be noncompliant with leg elevation  patient needs compression device call [employer] they have removal and secured wire, encouraged patient to elevate both legs until patient can afford [employer] devices.  Lactic acidosis.  Improved with above treatment.  rptblood cultures are negative to date.  * pAfib.  Rapid ventricular rate due to sepsis.     Increased Toprol to 75 mg daily and continued Xarelto.    * HTN Iontinue Toprol.  Rate controlled.  Hyponatremia.  Improved to baseline with normal saline IV, and sodium tablets p.o.   Generalized weakness.  PT evaluation: HHPT.  Out of bed to ambulate as  tolerated  Anxiety,, restlessness:    disCussed with patient's daughter Shauna Hugh [phone] at bedside  patient's son-in-law Dr.Snuffin [phone]  More than 50% time spent in counseling, coordination of the care.  All the records are reviewed and case discussed with Care Management/Social Worker. Management plans discussed with the patient, her daughters and they are in agreement.  CODE STATUS: DNR  TOTAL TIME TAKING CARE OF THIS PATIENT: 36 minutes.   POSSIBLE D/C IN 1 DAYS, DEPENDING ON CLINICAL CONDITION.   Nicholes Mango M.D on 11/26/2017 at 3:22 PM  Between 7am to 6pm - Pager -  [phone]  After 6pm go to [email or web address] - [facility]

## 2017-11-26 NOTE — Progress Notes (Signed)
Heather Larson , MD [address] [place], [place], [place], [address] [address] [place], [place], [place], [address] Phone: [phone]  Fax: [phone]   Heather Larson is being followed for c diff colitis   Subjective: Doing better, stool more formed, 1-2 bowel movements today    Objective: Vital signs in last 24 hours: Vitals:   11/25/17 2353 11/26/17 0500 11/26/17 0858 11/26/17 1320  BP: (!) 102/56  130/78   Pulse:   77 88  Resp:      Temp:   98.1 F (36.7 C)   TempSrc:   Oral   SpO2:   98% 99%  Weight:  114 lb 8 oz (51.9 kg)    Height:       Weight change: 2 lb 8 oz (1.134 kg)  Intake/Output Summary (Last 24 hours) at 11/26/2017 1447 Last data filed at 11/26/2017 1046 Gross per 24 hour  Intake 1101.67 ml  Output -  Net 1101.67 ml     Exam: Heart:: Regular rate and rhythm, S1S2 present or without murmur or extra heart sounds Lungs: normal, clear to auscultation and clear to auscultation and percussion Abdomen: soft, nontender, normal bowel sounds   Lab Results: CBC Latest Ref Rng & Units 11/25/2017 11/24/2017 11/23/2017  WBC 3.6 - 11.0 K/uL 11.7(H) 13.0(H) 12.2(H)  Hemoglobin 12.0 - 16.0 g/dL 8.2(L) 8.3(L) 8.4(L)  Hematocrit 35.0 - 47.0 % 23.7(L) 24.2(L) 24.9(L)  Platelets 150 - 440 K/uL 618(H) 610(H) 509(H)   BMP Latest Ref Rng & Units 11/24/2017 11/17/2017 11/16/2017  Glucose 70 - 99 mg/dL 95 98 117(H)  BUN 8 - 23 mg/dL 27(H) 17 15  Creatinine 0.44 - 1.00 mg/dL 0.81 0.73 0.58  Sodium 135 - 145 mmol/L 130(L) 129(L) 128(L)  Potassium 3.5 - 5.1 mmol/L 4.5 3.9 4.0  Chloride 98 - 111 mmol/L 97(L) 95(L) 96(L)  CO2 22 - 32 mmol/L 26 26 26   Calcium 8.9 - 10.3 mg/dL 8.4(L) 8.2(L) 8.2(L)    Micro Results: Recent Results (from the past 240 hour(s))  CULTURE, BLOOD (ROUTINE X 2) w Reflex to ID Panel     Status: None   Collection Time: 11/17/17  9:20 AM  Result Value Ref Range Status   Specimen Description BLOOD Blood Culture adequate volume  Final   Special Requests   Final    BOTTLES DRAWN AEROBIC AND ANAEROBIC BLOOD LEFT HAND   Culture   Final    NO GROWTH 5 DAYS Performed at [facility], [address] [place], [place] [address]    Report Status 11/22/2017 FINAL  Final  CULTURE, BLOOD (ROUTINE X 2) w Reflex to ID Panel     Status: None   Collection Time: 11/17/17  9:32 AM  Result Value Ref Range Status   Specimen Description BLOOD Blood Culture adequate volume  Final   Special Requests   Final    BOTTLES DRAWN AEROBIC AND ANAEROBIC RESISTANT WRIST   Culture   Final    NO GROWTH 5 DAYS Performed at [facility], [address] [place], [place] [address]    Report Status 11/22/2017 FINAL  Final  C difficile quick scan w PCR reflex     Status: Abnormal   Collection Time: 11/19/17 11:45 AM  Result Value Ref Range Status   C Diff antigen POSITIVE (A) NEGATIVE Final    Comment: RESULT CALLED TO, READ BACK BY AND VERIFIED WITH: CORY HUDSON @1350  11/19/17 [facility]    C Diff toxin POSITIVE (A) NEGATIVE Final  Comment: RESULT CALLED TO, READ BACK BY AND VERIFIED WITH: CORY HUDSON @1350  11/19/17 [facility]    C Diff interpretation Toxin producing C. difficile detected.  Final    Comment: RESULT CALLED TO, READ BACK BY AND VERIFIED WITH: CORY HUDSON @1350  11/19/17 [facility] Performed at [facility], [address] [place], [place] [address]   C difficile quick scan w PCR reflex     Status: Abnormal   Collection Time: 11/23/17  7:38 PM  Result Value Ref Range Status   C Diff antigen POSITIVE (A) NEGATIVE Final   C Diff toxin NEGATIVE NEGATIVE Final   C Diff interpretation Results are indeterminate. See PCR results.  Final    Comment: Performed at [facility], [address] [place], [place] [address]  C. Diff by PCR, Reflexed     Status: Abnormal   Collection Time: 11/23/17  7:38 PM  Result Value Ref Range Status   Toxigenic C. Difficile by PCR POSITIVE (A) NEGATIVE Final    Comment: Positive for  toxigenic C. difficile with little to no toxin production. Only treat if clinical presentation suggests symptomatic illness. Performed at [facility], [address] [place],  [address]    Studies/Results: Dg Abd 1 View  Result Date: 11/24/2017 CLINICAL DATA:  C difficile colitis.  Sepsis EXAM: ABDOMEN - 1 VIEW COMPARISON:  None. FINDINGS: Nonobstructive bowel gas pattern. No free air or organomegaly. Extensive aortic and iliac calcifications. No visible aneurysm. No acute bony abnormality. Degenerative changes in the thoracic and lumbar spine. IMPRESSION: No evidence of bowel obstruction or free air. Aortic atherosclerosis. Electronically Signed   By: Rolm Baptise M.D.   On: 11/24/2017 16:28   Medications: I have reviewed the patient's current medications. Scheduled Meds: . acidophilus  2 capsule Oral TID  . atorvastatin  10 mg Oral Daily  . brimonidine  1 drop Both Eyes BID  . feeding supplement (ENSURE ENLIVE)  237 mL Oral BID BM  . hydrALAZINE  50 mg Oral Q8H  . meloxicam  7.5 mg Oral Daily  . metoprolol succinate  100 mg Oral Daily  . multivitamin with minerals  1 tablet Oral Daily  . Rivaroxaban  15 mg Oral Daily  . Travoprost (BAK Free)  1 drop Both Eyes QHS  . vancomycin  125 mg Oral Q6H   Continuous Infusions: . metronidazole Stopped (11/26/17 0705)   PRN Meds:.acetaminophen **OR** acetaminophen, albuterol, diphenhydrAMINE, ibuprofen, ondansetron **OR** ondansetron (ZOFRAN) IV   Assessment: Active Problems:   Bilateral lower leg cellulitis   C. difficile colitis   Malnutrition of moderate degree  Heather Larson 84 y.o. female admitted with c diff colitis. Flagyl IV added to oral vancomycin yesterday. Heather Larson trending down . Clinically doing much better  Plan: 1. Continue vancomycin, flagyl can be d/c just before she goes home. Follow up with GI as an outpatient to gradually wean off Vancomycin    LOS: 15 days   Heather Bellows, MD 11/26/2017, 2:47 PM

## 2017-11-26 NOTE — Progress Notes (Signed)
Physical Therapy Treatment Patient Details Name: Heather Larson MRN: [id number] DOB: 10-Nov-1923 Today's Date: 11/26/2017    History of Present Illness 84 y.o. female with b/l LE cellulitis with PMH atrial fibrillation, chronic hyponatremia, chronic lower extremity edema, hypertension presents from independent living facility after her apartment manager and friend and found out that she had fever.      PT Comments    Pt presents with minor deficits in strength, transfers, mobility, gait, balance, and activity tolerance and is making good progress towards goals.  Pt required SBA with transfers with good control and stability although pt did require repeated verbal cues for proper hand placement.  Pt was able to amb 2 x 200' with a RW and CGA/SBA with slow cadence and flexed trunk posture with cues for amb closer to the RW for safety.  Pt's SpO2 and HR were WNL during the session with no adverse symptoms noted by pt other than mild general fatigue after amb.  Pt will benefit from HHPT services upon discharge to safely address above deficits for decreased caregiver assistance and eventual return to PLOF.    Follow Up Recommendations  Home health PT;Supervision - Intermittent     Equipment Recommendations  None recommended by PT    Recommendations for Other Services       Precautions / Restrictions Precautions Precautions: Fall Restrictions Weight Bearing Restrictions: No    Mobility  Bed Mobility               General bed mobility comments: NT, pt in recliner  Transfers Overall transfer level: Needs assistance Equipment used: Rolling walker (2 wheeled) Transfers: Sit to/from Stand Sit to Stand: Supervision         General transfer comment: Mod verbal cues for proper hand placement  Ambulation/Gait Ambulation/Gait assistance: Supervision/CGA Gait Distance (Feet): 200 Feet x 2 Assistive device: Rolling walker (2 wheeled) Gait Pattern/deviations: Step-through  pattern;Trunk flexed Gait velocity: decreased   General Gait Details: Mod verbal cues for amb closer to Heather Larson    Modified Rankin (Stroke Patients Only)       Balance Overall balance assessment: Needs assistance Sitting-balance support: Feet supported Sitting balance-Leahy Scale: Good     Standing balance support: Bilateral upper extremity supported Standing balance-Leahy Scale: Fair                              Cognition Arousal/Alertness: Awake/alert Behavior During Therapy: WFL for tasks assessed/performed Overall Cognitive Status: Within Functional Limits for tasks assessed                                        Exercises Total Joint Exercises Ankle Circles/Pumps: AROM;Both;10 reps;15 reps Quad Sets: Strengthening;Both;10 reps;15 reps Gluteal Sets: Strengthening;Both;10 reps;5 reps Straight Leg Raises: AAROM;Both;10 reps Long Arc Quad: AROM;Both;10 reps;5 reps Knee Flexion: AROM;Both;5 reps;10 reps Marching in Standing: AROM;Both;5 reps;10 reps Other Exercises Other Exercises: Multiple sit to/from stand transfer training with cues for hand placement Other Exercises: HEP education and review with pt and family for BLE APs, GS, QS, and LAQs    General Comments        Pertinent Vitals/Pain Pain Assessment: No/denies pain    Home Living  Prior Function            PT Goals (current goals can now be found in the care plan section) Progress towards PT goals: Progressing toward goals    Frequency    Min 2X/week      PT Plan Current plan remains appropriate    Co-evaluation              AM-PAC PT "6 Clicks" Daily Activity  Outcome Measure                   End of Session Equipment Utilized During Treatment: Gait belt Activity Tolerance: Patient tolerated treatment well Patient left: in chair;with chair alarm set;with call  bell/phone within reach;with family/visitor present;Other (comment)(BLEs elevated) Nurse Communication: Mobility status PT Visit Diagnosis: Other abnormalities of gait and mobility (R26.89);Unsteadiness on feet (R26.81);Muscle weakness (generalized) (M62.81)     Time: 7505-1833 PT Time Calculation (min) (ACUTE ONLY): 42 min  Charges:  $Gait Training: 8-22 mins $Therapeutic Exercise: 8-22 mins $Therapeutic Activity: 8-22 mins                     D. Scott Isaiah Torok PT, DPT 11/26/17, 1:29 PM

## 2017-11-26 NOTE — Care Management (Signed)
The plan for discharge is RN with [facility] and PT with [facility] at [facility]. She has a private pay home health aide through a private home care agency. Possible DC tomorrow. [facility] updated.

## 2017-11-27 ENCOUNTER — Telehealth: Payer: Self-pay | Admitting: Primary Care

## 2017-11-27 ENCOUNTER — Telehealth: Payer: Self-pay | Admitting: Gastroenterology

## 2017-11-27 DIAGNOSIS — A0472 Enterocolitis due to Clostridium difficile, not specified as recurrent: Secondary | ICD-10-CM

## 2017-11-27 DIAGNOSIS — E871 Hypo-osmolality and hyponatremia: Secondary | ICD-10-CM

## 2017-11-27 LAB — BASIC METABOLIC PANEL
Anion gap: 6 (ref 5–15)
BUN: 32 mg/dL — AB (ref 8–23)
CHLORIDE: 97 mmol/L — AB (ref 98–111)
CO2: 25 mmol/L (ref 22–32)
CREATININE: 0.86 mg/dL (ref 0.44–1.00)
Calcium: 8.5 mg/dL — ABNORMAL LOW (ref 8.9–10.3)
GFR calc non Af Amer: 57 mL/min — ABNORMAL LOW (ref >60)
Glucose, Bld: 98 mg/dL (ref 70–99)
Potassium: 4.9 mmol/L (ref 3.5–5.1)
Sodium: 128 mmol/L — ABNORMAL LOW (ref 135–145)

## 2017-11-27 LAB — CBC
HCT: 25.2 % — ABNORMAL LOW (ref 35.0–47.0)
HEMOGLOBIN: 8.6 g/dL — AB (ref 12.0–16.0)
MCH: 26.7 pg (ref 26.0–34.0)
MCHC: 34.3 g/dL (ref 32.0–36.0)
MCV: 77.9 fL — AB (ref 80.0–100.0)
Platelets: 652 10*3/uL — ABNORMAL HIGH (ref 150–440)
RBC: 3.24 MIL/uL — ABNORMAL LOW (ref 3.80–5.20)
RDW: 15.6 % — ABNORMAL HIGH (ref 11.5–14.5)
WBC: 9.7 10*3/uL (ref 3.6–11.0)

## 2017-11-27 MED ORDER — METOPROLOL SUCCINATE ER 25 MG PO TB24: 75.0000 mg | ORAL_TABLET | Freq: Every day | ORAL | 0 refills | Status: DC

## 2017-11-27 MED ORDER — RISAQUAD PO CAPS: 2.0000 | ORAL_CAPSULE | Freq: Three times a day (TID) | ORAL | 0 refills | Status: DC

## 2017-11-27 MED ORDER — BUMETANIDE 1 MG PO TABS
1.0000 mg | ORAL_TABLET | Freq: Every day | ORAL | Status: DC
  Filled 2017-11-27: qty 1

## 2017-11-27 MED ORDER — FUROSEMIDE 10 MG/ML IJ SOLN
10.0000 mg | Freq: Once | INTRAMUSCULAR | Status: AC
  Administered 2017-11-27: 10 mg via INTRAVENOUS
  Filled 2017-11-27: qty 4

## 2017-11-27 MED ORDER — VANCOMYCIN 50 MG/ML ORAL SOLUTION: 125.0000 mg | Freq: Four times a day (QID) | ORAL | 0 refills | Status: AC

## 2017-11-27 MED ORDER — ENSURE ENLIVE PO LIQD: 237.0000 mL | Freq: Two times a day (BID) | ORAL | 0 refills | Status: DC

## 2017-11-27 NOTE — Telephone Encounter (Signed)
Left vm for pt to call office and schedule 10 day fu with Dr. Marius Ditch  From [facility]

## 2017-11-27 NOTE — Progress Notes (Signed)
Pt ready for d/c home today per MD. Reviewed discharge instructions and prescriptions with pt and her daughter, Shauna Hugh, all questions answered. PIVs removed, VSS. [name] is taking prescriptions to pharmacy and is coming back to pick up the pt. Will assist pt to car when she returns.   Lutcher, Jerry Caras

## 2017-11-27 NOTE — Telephone Encounter (Signed)
She had repeat labs today so we do need a few days in between to see how she's recovering. Fortunately the labs today are showing improvement which is great news. Let's have her come in Friday this week for repeat labs and see her in the clinic next week August 7th at 4 pm. In the mean time she has her daughter who looks after her frequently and is frequently updating me through her online portal.

## 2017-11-27 NOTE — Discharge Instructions (Signed)
Follow-up with primary care physician in 2 to 3 days  continue home health Follow-up with gastroenterology in 10 days Keep your legs elevated by 2 pillows  continue wearing TED hose ID is recommending  to minimize other family members from exposure to cdifficile spores  - recommend to use soap and water for hand hygiene, dilligent hand hygiene for all family members/household members - recommend to use bleach wipes/household bleach to scrub/wipe down bathroom fixtures ( toilet seat, sink handles, door handles) daily - wear disposable gloves when cleaning; wash hands with soap and water afterwards - can also see patient education material - at [email or web address].

## 2017-11-27 NOTE — Discharge Summary (Addendum)
[facility] NAME: Heather Larson    MR#:  [id number]  DATE OF BIRTH:  Aug 05, 1923  DATE OF ADMISSION:  11/11/2017 ADMITTING PHYSICIAN: Hillary Bow, MD  DATE OF DISCHARGE:  11/27/17  PRIMARY CARE PHYSICIAN: Pleas Koch, NP    ADMISSION DIAGNOSIS:  Cellulitis of abdominal wall [L03.311] Hypokalemia [E87.6] Hyponatremia [E87.1] Chronic atrial fibrillation (HCC) [I48.2] Elevated LFTs [R94.5] Elevated brain natriuretic peptide (BNP) level [R79.89] Cellulitis of lower extremity, unspecified laterality [L03.119] Sepsis, due to unspecified organism ([place]) [A41.9]  DISCHARGE DIAGNOSIS:  Active Problems:   Bilateral lower leg cellulitis   C. difficile colitis   Malnutrition of moderate degree Chronic lower extremity edema  SECONDARY DIAGNOSIS:   Past Medical History:  Diagnosis Date  . A-fib ([place])   . Bilateral lower extremity edema   . Cancer [facility])    breast cancer at age 33 and skin  . Cellulitis    lower abdomen  . Hyperlipidemia   . Hypertension   . Hypertension   . Incontinence   . Low sodium levels   . Macular degeneration     HOSPITAL COURSE:   HPI  Heather Larson  is a 84 y.o. female with a known history of atrial fibrillation, chronic hyponatremia, chronic lower extremity edema, hypertension presents from independent living facility after her apartment manager and friend and found out that she had fever.  Patient had some nausea yesterday and felt weak.  Today she developed mild shortness of breath which is resolved at this time.  No cough or chest pain.  No abdominal pain.  She does not have any tenderness in her legs.  Redness extends from her feet all the way up to lower abdomen bilaterally.  Had similar episode twice with the last one being 2 years back.  She does have chronic lower committee edema but no diagnosis of CHF.  Follows with Dr. Rockey Situ of cardiology.  Recently her Cardizem was changed to  metoprolol to see if that helps her lower extremity edema.  * C. difficile colitis, clinically much better Less frequency of diarrhea, more formed stool and less bloating.  Feeling much better wants to go home Flagyl 500 mg IV every 8 hours is added to the regimen by GI , will discontinue Flagyl IV as recommended by gastroenterology prior to the discharge continue p.o. vancomycin 125 mg p.o. every 6 hours for 14 days minimum and the stop date will be decided by gastroenterology during the follow-up visit. Repeat stool sample is positive for C. difficile toxin    WBC also down from 34.3 to 17 to 12-13--11.7--9.7 On probiotics ID is recommending  to minimize other family members from exposure to cdifficile spores  - recommend to use soap and water for hand hygiene, dilligent hand hygiene for all family members/household members - recommend to use bleach wipes/household bleach to scrub/wipe down bathroom fixtures ( toilet seat, sink handles, door handles) daily - wear disposable gloves when cleaning; wash hands with soap and water afterwards - can also see patient education material - at [email or web address].             *Sepsis due to B/L LE and lower abdominal wall cellulitis and strep bacteremia, s/p  Ancef at this time.   Discontinue Ancef as pt has chronic erythema  seen by wound care nurse recommended to elevate the legs, patient seems to be noncompliant with leg elevation   *Chronic bilateral lower extremity edema TED Continue Bumex at  home medication, which was held during the hospital course from severe diarrhea Encouraged patient to keep her legs elevated by 2 pillows patient needs compression device call [employer] they have removal and secured wire, encouraged patient to elevate both legs until patient can afford [employer] devices.  Lactic acidosis.  Improved with above treatment.  rptblood cultures are negative to date.    * pAfib.  Rapid ventricular rate  due to sepsis.    Increased Toprol to 75 mg daily and continued Xarelto.    * HTN Iontinue Toprol.  Rate controlled.  Hyponatremia.  Improved to baseline , and sodium tablets p.o. . Her baseline seems to be at around 1 26-1 28  Generalized weakness.  PT evaluation: HHPT.  Out of bed to ambulate as  tolerated  Anxiety,, restlessness:    disCussed with patient's daughter Shauna Hugh [phone] at bedside, plan of care discussed in detail both patient and daughter Shauna Hugh are agreeable.     DISCHARGE CONDITIONS:   Stable  CONSULTS OBTAINED:  Treatment Team:  Lucilla Lame, MD Jonathon Bellows, MD   PROCEDURES  none  DRUG ALLERGIES:  No Known Allergies  DISCHARGE MEDICATIONS:   Allergies as of 11/27/2017   No Known Allergies     Medication List    TAKE these medications   acidophilus Caps capsule Take 2 capsules by mouth 3 (three) times daily.   atorvastatin 10 MG tablet Commonly known as:  LIPITOR Take 1 tablet (10 mg total) by mouth daily.   brimonidine 0.1 % Soln Commonly known as:  ALPHAGAN P Place 1 drop into both eyes 2 (two) times daily.   bumetanide 1 MG tablet Commonly known as:  BUMEX Take 1 tablet (1 mg total) by mouth daily as needed. Patient takes 1/2 tablet daily.   docusate sodium 100 MG capsule Commonly known as:  COLACE Take 1 capsule (100 mg total) by mouth daily.   feeding supplement (ENSURE ENLIVE) Liqd Take 237 mLs by mouth 2 (two) times daily between meals.   hydrALAZINE 50 MG tablet Commonly known as:  APRESOLINE Take 1 tablet (50 mg total) by mouth 3 (three) times daily.   meloxicam 7.5 MG tablet Commonly known as:  MOBIC Take 7.5 mg by mouth daily.   metoprolol succinate 25 MG 24 hr tablet Commonly known as:  TOPROL-XL Take 3 tablets (75 mg total) by mouth daily. What changed:    medication strength  how much to take  additional instructions   MULTIVITAMIN ADULT Tabs Take 1 tablet by mouth daily.   Rivaroxaban 15 MG  Tabs tablet Commonly known as:  XARELTO Take 1 tablet (15 mg total) by mouth daily.   travoprost (benzalkonium) 0.004 % ophthalmic solution Commonly known as:  TRAVATAN Place 1 drop into both eyes at bedtime.   valsartan-hydrochlorothiazide 160-25 MG tablet Commonly known as:  DIOVAN-HCT Take 1 tablet by mouth daily.   vancomycin 50 mg/mL oral solution Commonly known as:  VANCOCIN Take 2.5 mLs (125 mg total) by mouth every 6 (six) hours for 14 days.   vitamin C 500 MG tablet Commonly known as:  ASCORBIC ACID Take 500 mg by mouth daily.        DISCHARGE INSTRUCTIONS:  Follow-up with primary care physician in 2 to 3 days Follow-up with gastroenterology in 1 week  continue home health  continue TED hose, encourage patient to keep legs elevated by 2 pillows     DIET:  Cardiac diet  DISCHARGE CONDITION:  Stable  ACTIVITY:  Activity as tolerated  OXYGEN:  Home Oxygen: No.   Oxygen Delivery: room air  DISCHARGE LOCATION:  STABLE  If you experience worsening of your admission symptoms, develop shortness of breath, life threatening emergency, suicidal or homicidal thoughts you must seek medical attention immediately by calling 911 or calling your MD immediately  if symptoms less severe.  You Must read complete instructions/literature along with all the possible adverse reactions/side effects for all the Medicines you take and that have been prescribed to you. Take any new Medicines after you have completely understood and accpet all the possible adverse reactions/side effects.   Please note  You were cared for by a hospitalist during your hospital stay. If you have any questions about your discharge medications or the care you received while you were in the hospital after you are discharged, you can call the unit and asked to speak with the hospitalist on call if the hospitalist that took care of you is not available. Once you are discharged, your primary care physician  will handle any further medical issues. Please note that NO REFILLS for any discharge medications will be authorized once you are discharged, as it is imperative that you return to your primary care physician (or establish a relationship with a primary care physician if you do not have one) for your aftercare needs so that they can reassess your need for medications and monitor your lab values.     Today  No chief complaint on file.  Pt is feeling better. Stool formed. Has ch lower leg swelling  ROS:  CONSTITUTIONAL: Denies fevers, chills. Denies any fatigue, weakness.  EYES: Denies blurry vision, double vision, eye pain. EARS, NOSE, THROAT: Denies tinnitus, ear pain, hearing loss. RESPIRATORY: Denies cough, wheeze, shortness of breath.  CARDIOVASCULAR: Denies chest pain, palpitations, edema.  GASTROINTESTINAL: Denies nausea, vomiting, diarrhea, abdominal pain. Denies bright red blood per rectum. GENITOURINARY: Denies dysuria, hematuria. ENDOCRINE: Denies nocturia or thyroid problems. HEMATOLOGIC AND LYMPHATIC: Denies easy bruising or bleeding. SKIN: Denies rash or lesion. MUSCULOSKELETAL: Denies pain in neck, back, shoulder, knees, hips or arthritic symptoms.  NEUROLOGIC: Denies paralysis, paresthesias.  PSYCHIATRIC: Denies anxiety or depressive symptoms.   VITAL SIGNS:  Blood pressure 115/62, pulse 65, temperature 97.8 F (36.6 C), temperature source Oral, resp. rate 19, height 5\' 2"  (1.575 m), weight 51 kg (112 lb 8 oz), SpO2 96 %.  I/O:    Intake/Output Summary (Last 24 hours) at 11/27/2017 1233 Last data filed at 11/27/2017 0754 Gross per 24 hour  Intake 1206.67 ml  Output -  Net 1206.67 ml    PHYSICAL EXAMINATION:  GENERAL:  84 y.o.-year-old patient lying in the bed with no acute distress.  EYES: Pupils equal, round, reactive to light and accommodation. No scleral icterus. Extraocular muscles intact.  HEENT: Head atraumatic, normocephalic. Oropharynx and nasopharynx  clear.  NECK:  Supple, no jugular venous distention. No thyroid enlargement, no tenderness.  LUNGS: Normal breath sounds bilaterally, no wheezing, rales,rhonchi or crepitation. No use of accessory muscles of respiration.  CARDIOVASCULAR: S1, S2 normal. No murmurs, rubs, or gallops.  ABDOMEN: Soft, non-tender, non-distended. Bowel sounds present.  EXTREMITIES: No pedal edema, cyanosis, or clubbing.  NEUROLOGIC: Cranial nerves II through XII are intact. Muscle strength 5/5 in all extremities. Sensation intact. Gait not checked. ch lower leg edema, no erythema PSYCHIATRIC: The patient is alert and oriented x 3.  SKIN: No obvious rash, lesion, or ulcer.   DATA REVIEW:   CBC Recent Labs  Lab 11/27/17 0412  WBC 9.7  HGB  8.6*  HCT 25.2*  PLT 652*    Chemistries  Recent Labs  Lab 11/21/17 0416  11/24/17 0452 11/27/17 0412  NA  --    < > 130* 128*  K  --    < > 4.5 4.9  CL  --    < > 97* 97*  CO2  --    < > 26 25  GLUCOSE  --    < > 95 98  BUN  --    < > 27* 32*  CREATININE  --    < > 0.81 0.86  CALCIUM  --    < > 8.4* 8.5*  MG  --   --  1.8  --   AST 22  --   --   --   ALT 16  --   --   --   ALKPHOS 85  --   --   --   BILITOT 0.6  --   --   --    < > = values in this interval not displayed.    Cardiac Enzymes No results for input(s): TROPONINI in the last 168 hours.  Microbiology Results  Results for orders placed or performed during the hospital encounter of 11/11/17  Blood Culture (routine x 2)     Status: Abnormal   Collection Time: 11/11/17  4:12 PM  Result Value Ref Range Status   Specimen Description   Final    BLOOD BLOOD RIGHT WRIST Performed at [facility], [address], [place], [place] [address]    Special Requests   Final    BOTTLES DRAWN AEROBIC AND ANAEROBIC Blood Culture adequate volume Performed at [facility], [address] [place], [place] [address]    Culture  Setup Time   Final    GRAM POSITIVE COCCI IN BOTH AEROBIC  AND ANAEROBIC BOTTLES CRITICAL VALUE NOTED.  VALUE IS CONSISTENT WITH PREVIOUSLY REPORTED AND CALLED VALUE. Performed at [facility], [address] [place], [place] [address]    Culture (A)  Final    STREPTOCOCCUS GROUP G SUSCEPTIBILITIES PERFORMED ON PREVIOUS CULTURE WITHIN THE LAST 5 DAYS. Performed at [facility], [place] [address] [place], [place] [address]    Report Status 11/14/2017 FINAL  Final  Blood Culture (routine x 2)     Status: Abnormal   Collection Time: 11/11/17  4:19 PM  Result Value Ref Range Status   Specimen Description   Final    BLOOD R F A Performed at [facility], [address], [place],  [address]    Special Requests   Final    BOTTLES DRAWN AEROBIC AND ANAEROBIC Blood Culture adequate volume Performed at [facility], [address] [place],  [address]    Culture  Setup Time   Final    GRAM POSITIVE COCCI IN BOTH AEROBIC AND ANAEROBIC BOTTLES CRITICAL RESULT CALLED TO, READ BACK BY AND VERIFIED WITH: JASON ROBBINS AT 0456 ON 11/12/17 [place].    Culture (A)  Final    STREPTOCOCCUS GROUP G CORRECTED ON 07/22 AT 0932: PREVIOUSLY REPORTED AS STREPTOCOCCUS PYOGENES RESULT CALLED TO, READ BACK BY AND VERIFIED WITH: N. COOKSON, [place] ([place]) AT 0930 ON 11/19/17 BY C. JESSUP, MLT. Performed at [facility], [place] [address] [place],  [address]    Report Status 11/14/2017 FINAL  Final   Organism ID, Bacteria STREPTOCOCCUS GROUP G  Final      Susceptibility   Streptococcus group g -  MIC*    PENICILLIN <=0.06 SENSITIVE Sensitive     CEFTRIAXONE <=0.12 SENSITIVE Sensitive     ERYTHROMYCIN <=0.12 SENSITIVE Sensitive     LEVOFLOXACIN 0.5 SENSITIVE Sensitive     VANCOMYCIN 0.5 SENSITIVE Sensitive     * STREPTOCOCCUS GROUP G CORRECTED ON 07/22 AT 0932: PREVIOUSLY REPORTED AS STREPTOCOCCUS PYOGENES  Urine culture     Status: None   Collection Time: 11/11/17  4:19 PM  Result Value Ref Range Status    Specimen Description   Final    URINE, RANDOM Performed at [facility], [address] [place], [place] [address]    Special Requests   Final    NONE Performed at [facility], [address], [place], [place] [address]    Culture   Final    NO GROWTH Performed at [facility], [place] [address] [place], [place] [address]    Report Status 11/12/2017 FINAL  Final  Blood Culture ID Panel (Reflexed)     Status: Abnormal   Collection Time: 11/11/17  4:19 PM  Result Value Ref Range Status   Enterococcus species NOT DETECTED NOT DETECTED Final   Listeria monocytogenes NOT DETECTED NOT DETECTED Final   Staphylococcus species NOT DETECTED NOT DETECTED Final   Staphylococcus aureus NOT DETECTED NOT DETECTED Final   Streptococcus species DETECTED (A) NOT DETECTED Final    Comment: Not Enterococcus species, Streptococcus agalactiae, Streptococcus pyogenes, or Streptococcus pneumoniae. CRITICAL RESULT CALLED TO, READ BACK BY AND VERIFIED WITH: JASON ROBBINS AT 3976 11/12/17 [place]    Streptococcus agalactiae NOT DETECTED NOT DETECTED Final   Streptococcus pneumoniae NOT DETECTED NOT DETECTED Final   Streptococcus pyogenes NOT DETECTED NOT DETECTED Final   Acinetobacter baumannii NOT DETECTED NOT DETECTED Final   Enterobacteriaceae species NOT DETECTED NOT DETECTED Final   Enterobacter cloacae complex NOT DETECTED NOT DETECTED Final   Escherichia coli NOT DETECTED NOT DETECTED Final   Klebsiella oxytoca NOT DETECTED NOT DETECTED Final   Klebsiella pneumoniae NOT DETECTED NOT DETECTED Final   Proteus species NOT DETECTED NOT DETECTED Final   Serratia marcescens NOT DETECTED NOT DETECTED Final   Haemophilus influenzae NOT DETECTED NOT DETECTED Final   Neisseria meningitidis NOT DETECTED NOT DETECTED Final   Pseudomonas aeruginosa NOT DETECTED NOT DETECTED Final   Candida albicans NOT DETECTED NOT DETECTED Final   Candida glabrata NOT DETECTED NOT DETECTED Final    Candida krusei NOT DETECTED NOT DETECTED Final   Candida parapsilosis NOT DETECTED NOT DETECTED Final   Candida tropicalis NOT DETECTED NOT DETECTED Final    Comment: Performed at [facility], [address] [place], [place] [address]  CULTURE, BLOOD (ROUTINE X 2) w Reflex to ID Panel     Status: None   Collection Time: 11/17/17  9:20 AM  Result Value Ref Range Status   Specimen Description BLOOD Blood Culture adequate volume  Final   Special Requests   Final    BOTTLES DRAWN AEROBIC AND ANAEROBIC BLOOD LEFT HAND   Culture   Final    NO GROWTH 5 DAYS Performed at [facility], [address] [place], [place] [address]    Report Status 11/22/2017 FINAL  Final  CULTURE, BLOOD (ROUTINE X 2) w Reflex to ID Panel     Status: None   Collection Time: 11/17/17  9:32 AM  Result Value Ref Range Status   Specimen Description BLOOD Blood Culture adequate volume  Final   Special Requests   Final  BOTTLES DRAWN AEROBIC AND ANAEROBIC RESISTANT WRIST   Culture   Final    NO GROWTH 5 DAYS Performed at [facility], [address] [place], [place] [address]    Report Status 11/22/2017 FINAL  Final  C difficile quick scan w PCR reflex     Status: Abnormal   Collection Time: 11/19/17 11:45 AM  Result Value Ref Range Status   C Diff antigen POSITIVE (A) NEGATIVE Final    Comment: RESULT CALLED TO, READ BACK BY AND VERIFIED WITH: CORY HUDSON @1350  11/19/17 [place]    C Diff toxin POSITIVE (A) NEGATIVE Final    Comment: RESULT CALLED TO, READ BACK BY AND VERIFIED WITH: CORY HUDSON @1350  11/19/17 [facility]    C Diff interpretation Toxin producing C. difficile detected.  Final    Comment: RESULT CALLED TO, READ BACK BY AND VERIFIED WITH: CORY HUDSON @1350  11/19/17 [place] Performed at [facility], [address] [place], [place] [address]   C difficile quick scan w PCR reflex     Status: Abnormal   Collection Time: 11/23/17  7:38 PM  Result Value Ref Range Status    C Diff antigen POSITIVE (A) NEGATIVE Final   C Diff toxin NEGATIVE NEGATIVE Final   C Diff interpretation Results are indeterminate. See PCR results.  Final    Comment: Performed at [facility], [address] [place], [place] [address]  C. Diff by PCR, Reflexed     Status: Abnormal   Collection Time: 11/23/17  7:38 PM  Result Value Ref Range Status   Toxigenic C. Difficile by PCR POSITIVE (A) NEGATIVE Final    Comment: Positive for toxigenic C. difficile with little to no toxin production. Only treat if clinical presentation suggests symptomatic illness. Performed at [facility], [address] [place], [place] [address]     RADIOLOGY:  Bea Graff 1 View  Result Date: 11/24/2017 CLINICAL DATA:  C difficile colitis.  Sepsis EXAM: ABDOMEN - 1 VIEW COMPARISON:  None. FINDINGS: Nonobstructive bowel gas pattern. No free air or organomegaly. Extensive aortic and iliac calcifications. No visible aneurysm. No acute bony abnormality. Degenerative changes in the thoracic and lumbar spine. IMPRESSION: No evidence of bowel obstruction or free air. Aortic atherosclerosis. Electronically Signed   By: Rolm Baptise M.D.   On: 11/24/2017 16:28    EKG:   Orders placed or performed during the hospital encounter of 11/11/17  . EKG 12-Lead  . EKG 12-Lead      Management plans discussed with the patient, family and they are in agreement.  CODE STATUS:     Code Status Orders  (From admission, onward)        Start     Ordered   11/11/17 1805  Do not attempt resuscitation (DNR)  Continuous    Question Answer Comment  In the event of cardiac or respiratory ARREST Do not call a "code blue"   In the event of cardiac or respiratory ARREST Do not perform Intubation, CPR, defibrillation or ACLS   In the event of cardiac or respiratory ARREST Use medication by any route, position, wound care, and other measures to relive pain and suffering. May use oxygen, suction and manual treatment  of airway obstruction as needed for comfort.      11/11/17 1804    Code Status History    Date Active Date Inactive Code Status Order ID Comments User Context   11/11/2017 1731 11/11/2017 1804 Full Code [id number]  Hillary Bow, MD ED  Advance Directive Documentation     Most Recent Value  Type of Advance Directive  Healthcare Power of Attorney  Pre-existing out of facility DNR order (yellow form or pink MOST form)  -  "MOST" Form in Place?  -      TOTAL TIME TAKING CARE OF THIS PATIENT: 43  minutes.   Note: This dictation was prepared with Dragon dictation along with smaller phrase technology. Any transcriptional errors that result from this process are unintentional.   @[facility] @  on 11/27/2017 at 12:33 PM  Between 7am to 6pm - Pager - [phone]  After 6pm go to [email or web address] - password [email or web address] [facility]  [facility]  Office  [phone]  CC: Primary care physician; Pleas Koch, NP

## 2017-11-27 NOTE — Care Management (Signed)
[facility] notified of discharge.

## 2017-11-27 NOTE — Progress Notes (Signed)
Jonathon Bellows , MD [address], [place], [place], [place], [address] [id number] [address] [place], [place], [place], [address] Phone: [phone]  Fax: [phone]   Heather Larson is being followed for C diff colitis    Subjective: Doing better , fewer bowel habits.    Objective: Vital signs in last 24 hours: Vitals:   11/27/17 0500 11/27/17 0503 11/27/17 0815 11/27/17 1133  BP:  133/86 130/63 115/62  Pulse:  96 94 65  Resp:  18 19   Temp:  97.6 F (36.4 C) 97.8 F (36.6 C)   TempSrc:  Oral Oral   SpO2:  97% 96%   Weight: 112 lb 8 oz (51 kg)     Height:       Weight change: -2 lb (-0.907 kg)  Intake/Output Summary (Last 24 hours) at 11/27/2017 1511 Last data filed at 11/27/2017 0930 Gross per 24 hour  Intake 1446.67 ml  Output -  Net 1446.67 ml     Exam: Heart:: Regular rate and rhythm, S1S2 present or without murmur or extra heart sounds Lungs: normal, clear to auscultation and clear to auscultation and percussion Abdomen: soft, nontender, normal bowel sounds   Lab Results: @LABTEST2 @ Micro Results: Recent Results (from the past 240 hour(s))  C difficile quick scan w PCR reflex     Status: Abnormal   Collection Time: 11/19/17 11:45 AM  Result Value Ref Range Status   C Diff antigen POSITIVE (A) NEGATIVE Final    Comment: RESULT CALLED TO, READ BACK BY AND VERIFIED WITH: CORY HUDSON @1350  11/19/17 [facility]    C Diff toxin POSITIVE (A) NEGATIVE Final    Comment: RESULT CALLED TO, READ BACK BY AND VERIFIED WITH: CORY HUDSON @1350  11/19/17 [facility]    C Diff interpretation Toxin producing C. difficile detected.  Final    Comment: RESULT CALLED TO, READ BACK BY AND VERIFIED WITH: CORY HUDSON @1350  11/19/17 AKT Performed at [facility], [address] [place], [place] [address]   C difficile quick scan w PCR reflex     Status: Abnormal   Collection Time: 11/23/17  7:38 PM  Result Value Ref Range Status   C Diff antigen POSITIVE (A) NEGATIVE Final   C Diff  toxin NEGATIVE NEGATIVE Final   C Diff interpretation Results are indeterminate. See PCR results.  Final    Comment: Performed at [facility], [address] [place], [place] [address]  C. Diff by PCR, Reflexed     Status: Abnormal   Collection Time: 11/23/17  7:38 PM  Result Value Ref Range Status   Toxigenic C. Difficile by PCR POSITIVE (A) NEGATIVE Final    Comment: Positive for toxigenic C. difficile with little to no toxin production. Only treat if clinical presentation suggests symptomatic illness. Performed at [facility], [address], [place], [place] [address]    Studies/Results: No results found. Medications: I have reviewed the patient's current medications. Scheduled Meds: . acidophilus  2 capsule Oral TID  . atorvastatin  10 mg Oral Daily  . brimonidine  1 drop Both Eyes BID  . bumetanide  1 mg Oral Daily  . feeding supplement (ENSURE ENLIVE)  237 mL Oral BID BM  . hydrALAZINE  50 mg Oral Q8H  . meloxicam  7.5 mg Oral Daily  . metoprolol succinate  100 mg Oral Daily  . multivitamin with minerals  1 tablet Oral Daily  . Rivaroxaban  15 mg Oral Daily  . sodium chloride  1 g  Oral BID WC  . Travoprost (BAK Free)  1 drop Both Eyes QHS  . vancomycin  125 mg Oral Q6H   Continuous Infusions: PRN Meds:.acetaminophen **OR** acetaminophen, albuterol, diphenhydrAMINE, ibuprofen, ondansetron **OR** ondansetron (ZOFRAN) IV   Assessment: Active Problems:   Bilateral lower leg cellulitis   C. difficile colitis   Malnutrition of moderate degree  Heather Larson 84 y.o. female admitted with c diff colitis.Clinically doing much better  Plan: 1. D/c on vancomycin and follow up with Dr Marius Ditch as an outpatient   I will sign off.  Please call me if any further GI concerns or questions.  We would like to thank you for the opportunity to participate in the care of Heather Larson.       LOS: 16 days   Jonathon Bellows, MD 11/27/2017, 3:11 PM

## 2017-11-27 NOTE — Telephone Encounter (Signed)
Received CRM stating following:  Shelia from [facility] said pt need to see the doctor within 3 days and have labs drawn she was hospitalized for cellulitis left lower extremity  please call pt to schedule    When would you like for me to schedule pt?

## 2017-11-28 ENCOUNTER — Telehealth: Payer: Self-pay | Admitting: *Deleted

## 2017-11-28 NOTE — Telephone Encounter (Signed)
Lm requesting return call to complete TCM and confirm hosp f/u appt  

## 2017-11-28 NOTE — Telephone Encounter (Signed)
Lvm asking patient to call office. °

## 2017-11-29 ENCOUNTER — Telehealth: Payer: Self-pay | Admitting: *Deleted

## 2017-11-29 DIAGNOSIS — L03115 Cellulitis of right lower limb: Secondary | ICD-10-CM | POA: Diagnosis not present

## 2017-11-29 DIAGNOSIS — L03116 Cellulitis of left lower limb: Secondary | ICD-10-CM | POA: Diagnosis not present

## 2017-11-29 DIAGNOSIS — I10 Essential (primary) hypertension: Secondary | ICD-10-CM | POA: Diagnosis not present

## 2017-11-29 DIAGNOSIS — A0472 Enterocolitis due to Clostridium difficile, not specified as recurrent: Secondary | ICD-10-CM | POA: Diagnosis not present

## 2017-11-29 DIAGNOSIS — A409 Streptococcal sepsis, unspecified: Secondary | ICD-10-CM | POA: Diagnosis not present

## 2017-11-29 DIAGNOSIS — L03311 Cellulitis of abdominal wall: Secondary | ICD-10-CM | POA: Diagnosis not present

## 2017-11-29 NOTE — Telephone Encounter (Signed)
Copied from [place] [phone] [id number]. Topic: Quick Communication - See Telephone Encounter >> Nov 29, 2017  3:15 PM Hewitt Shorts wrote: Merry Proud with [facility] is calling to get verbal orders for skilled nursing disease management and physical therapy to evaluate and treat  Best number [phone] Merry Proud

## 2017-11-29 NOTE — Telephone Encounter (Signed)
[place] for verbal orders as stated

## 2017-11-29 NOTE — Telephone Encounter (Signed)
Lm requesting return call to complete TCM and confirm hosp f/u appt  If pt contacts office back pls schedule for 12/05/17 @ 1600 per [facility]. CRm created

## 2017-11-29 NOTE — Telephone Encounter (Signed)
Please advise if ok to refill. 

## 2017-11-30 ENCOUNTER — Encounter: Payer: Self-pay | Admitting: Gastroenterology

## 2017-11-30 NOTE — Telephone Encounter (Signed)
Left detailed message on voicemail for Heather Larson per his instructions. Verbal okay given.

## 2017-12-03 ENCOUNTER — Encounter: Payer: Self-pay | Admitting: Cardiovascular Disease

## 2017-12-03 ENCOUNTER — Other Ambulatory Visit: Payer: Self-pay | Admitting: Gastroenterology

## 2017-12-03 DIAGNOSIS — L03311 Cellulitis of abdominal wall: Secondary | ICD-10-CM | POA: Diagnosis not present

## 2017-12-03 DIAGNOSIS — L03116 Cellulitis of left lower limb: Secondary | ICD-10-CM | POA: Diagnosis not present

## 2017-12-03 DIAGNOSIS — A409 Streptococcal sepsis, unspecified: Secondary | ICD-10-CM | POA: Diagnosis not present

## 2017-12-03 DIAGNOSIS — A0472 Enterocolitis due to Clostridium difficile, not specified as recurrent: Secondary | ICD-10-CM

## 2017-12-03 DIAGNOSIS — I10 Essential (primary) hypertension: Secondary | ICD-10-CM | POA: Diagnosis not present

## 2017-12-03 DIAGNOSIS — L03115 Cellulitis of right lower limb: Secondary | ICD-10-CM | POA: Diagnosis not present

## 2017-12-05 ENCOUNTER — Telehealth: Payer: Self-pay | Admitting: Licensed Clinical Social Worker

## 2017-12-05 ENCOUNTER — Ambulatory Visit (INDEPENDENT_AMBULATORY_CARE_PROVIDER_SITE_OTHER): Payer: Medicare Other | Admitting: Primary Care

## 2017-12-05 ENCOUNTER — Encounter: Payer: Self-pay | Admitting: Primary Care

## 2017-12-05 VITALS — BP 140/70 | HR 87 | Temp 98.0°F | Ht 62.0 in | Wt 132.2 lb

## 2017-12-05 DIAGNOSIS — L03311 Cellulitis of abdominal wall: Secondary | ICD-10-CM | POA: Diagnosis not present

## 2017-12-05 DIAGNOSIS — A0472 Enterocolitis due to Clostridium difficile, not specified as recurrent: Secondary | ICD-10-CM | POA: Diagnosis not present

## 2017-12-05 DIAGNOSIS — L03116 Cellulitis of left lower limb: Secondary | ICD-10-CM | POA: Diagnosis not present

## 2017-12-05 DIAGNOSIS — E871 Hypo-osmolality and hyponatremia: Secondary | ICD-10-CM | POA: Diagnosis not present

## 2017-12-05 DIAGNOSIS — L03115 Cellulitis of right lower limb: Secondary | ICD-10-CM

## 2017-12-05 DIAGNOSIS — I10 Essential (primary) hypertension: Secondary | ICD-10-CM

## 2017-12-05 DIAGNOSIS — I482 Chronic atrial fibrillation, unspecified: Secondary | ICD-10-CM

## 2017-12-05 DIAGNOSIS — R600 Localized edema: Secondary | ICD-10-CM | POA: Diagnosis not present

## 2017-12-05 DIAGNOSIS — A409 Streptococcal sepsis, unspecified: Secondary | ICD-10-CM | POA: Diagnosis not present

## 2017-12-05 DIAGNOSIS — Z09 Encounter for follow-up examination after completed treatment for conditions other than malignant neoplasm: Secondary | ICD-10-CM | POA: Diagnosis not present

## 2017-12-05 NOTE — Progress Notes (Signed)
Subjective:    Patient ID: Heather Larson, female    DOB: 1923-10-13, 83 y.o.   MRN: [id number]  HPI  Heather Larson is a 84 year old female who presents today for hospital follow up.  She presented to [facility] ED via EMS on 11/11/17 with complaints of fever, nausea, and weakness. She was noted to have bilateral lower extremity redness with swelling. She was found to have cellulitis,  was treated with IV fluids and antibiotics, and admitted for further evaluation.   Sepsis protocol was initiated given elevated lactic acid, fevers, and tachycardia. Treated with IV Vancomycin and Cefepime. Blood cultures showed streptococcus so she was changed to IV Rocephin, then again to Levaquin.  During her stay her WBC continued to elevate with highest level of 34.3 K/uL, for this reason her discharge date of 07/20 was cancelled. Infectious disease was consulted who agreed with Levaquin and thought the increase in WBC count to be secondary to toxin release. She underwent bilateral lower extremity ultrasound to rule out DVT, this was negative. She developed diarrhea during her stay and was found to have c-diff colitis on 07/22, treated with oral vancomycin 125 mg PO and lactobacillus. GI consulted, agreed with PO Vancomycin. Over the next several days her WBC continued to decline. Flagyl 500 mg IV TID was also added given worsening diarrhea and abdominal bloating. Repeat C-Diff positive.   Hyponatremia improved to baseline, sodium tablets were added to regimen. Her Toprolol was increased to 75 mg daily given continued tachycardia in the hospital. She was set up for home health physical therapy evaluation in her assisted living home. IV Flagyl was discontinued prior to discharge.  She was discharged home on 11/27/17 with prescriptions for Vancomycin 125 mg PO every 6 hours for 14 days. She was recommended to follow up with GI soon after discharge.   Since her discharge home she's noticed significant decrease in diarrhea,  stools are getting more formed stools. She did notice black tarry stools for the first three days after discharge home. She is making sure to clean her toilet with bleach wipes. She's taking only 50 mg of her metoprolol succinate as she was unaware of the dose increase. She will notice that her HR will increase when walking with PT, improves with rest. She denies feeling palpitations or noticing an increase in HR with moving around her apartment.   Overall she's doing well with physical therapy, feels like she's getting stronger. She's taking Bumex 1 mg once daily, sometimes an additional 1/2 tablet. Her lower extremity redness and edema have decreased.   BP Readings from Last 3 Encounters:  12/05/17 140/70  11/27/17 135/62  10/02/17 (!) 157/85     Review of Systems  Constitutional: Negative for fever.  Respiratory: Negative for shortness of breath.   Cardiovascular: Positive for leg swelling. Negative for chest pain and palpitations.  Gastrointestinal: Negative for abdominal pain, blood in stool, diarrhea, nausea and vomiting.  Skin:       Improved color change to lower extremities  Neurological: Negative for dizziness.       Past Medical History:  Diagnosis Date  . A-fib ([place])   . Bilateral lower extremity edema   . Cancer [facility])    breast cancer at age 51 and skin  . Cellulitis    lower abdomen  . Hyperlipidemia   . Hypertension   . Hypertension   . Incontinence   . Low sodium levels   . Macular degeneration      Social  History   Socioeconomic History  . Marital status: Divorced    Spouse name: Not on file  . Number of children: Not on file  . Years of education: Not on file  . Highest education level: Not on file  Occupational History  . Not on file  Social Needs  . Financial resource strain: Not on file  . Food insecurity:    Worry: Not on file    Inability: Not on file  . Transportation needs:    Medical: Not on file    Non-medical: Not on file  Tobacco  Use  . Smoking status: Never Smoker  . Smokeless tobacco: Never Used  Substance and Sexual Activity  . Alcohol use: Yes    Alcohol/week: 2.0 standard drinks    Types: 2 Shots of liquor per week    Comment: 2 drinks daily  . Drug use: No  . Sexual activity: Not on file  Lifestyle  . Physical activity:    Days per week: Not on file    Minutes per session: Not on file  . Stress: Not on file  Relationships  . Social connections:    Talks on phone: Not on file    Gets together: Not on file    Attends religious service: Not on file    Active member of club or organization: Not on file    Attends meetings of clubs or organizations: Not on file    Relationship status: Not on file  . Intimate partner violence:    Fear of current or ex partner: Not on file    Emotionally abused: Not on file    Physically abused: Not on file    Forced sexual activity: Not on file  Other Topics Concern  . Not on file  Social History Narrative   Moved from [place].    Past Surgical History:  Procedure Laterality Date  . BREAST SURGERY     right mastectomy  . EYE SURGERY     x3  . IR RADIOLOGIST EVAL & MGMT  09/17/2017  . MASTECTOMY     right  . SHOULDER OPEN ROTATOR CUFF REPAIR     left  . SKIN BIOPSY     skin cancer    No family history on file.  No Known Allergies  Current Outpatient Medications on File Prior to Visit  Medication Sig Dispense Refill  . acidophilus (RISAQUAD) CAPS capsule Take 2 capsules by mouth 3 (three) times daily. 50 capsule 0  . atorvastatin (LIPITOR) 10 MG tablet Take 1 tablet (10 mg total) by mouth daily. 90 tablet 3  . brimonidine (ALPHAGAN P) 0.1 % SOLN Place 1 drop into both eyes 2 (two) times daily.    . bumetanide (BUMEX) 1 MG tablet TAKE 1 TABLET(1 MG) BY MOUTH DAILY AS NEEDED. PATIENT TAKES 1/2 TABLET DAILY AS DIRECTED 90 tablet 1  . docusate sodium (COLACE) 100 MG capsule Take 1 capsule (100 mg total) by mouth daily. 10 capsule 0  . feeding supplement,  ENSURE ENLIVE, (ENSURE ENLIVE) LIQD Take 237 mLs by mouth 2 (two) times daily between meals. 60 Bottle 0  . hydrALAZINE (APRESOLINE) 50 MG tablet Take 1 tablet (50 mg total) by mouth 3 (three) times daily. 270 tablet 3  . meloxicam (MOBIC) 7.5 MG tablet Take 7.5 mg by mouth daily.    . metoprolol succinate (TOPROL-XL) 25 MG 24 hr tablet Take 3 tablets (75 mg total) by mouth daily. 90 tablet 0  . Multiple Vitamins-Minerals (MULTIVITAMIN ADULT) TABS  Take 1 tablet by mouth daily.    . Rivaroxaban (XARELTO) 15 MG TABS tablet Take 1 tablet (15 mg total) by mouth daily. 90 tablet 3  . travoprost, benzalkonium, (TRAVATAN) 0.004 % ophthalmic solution Place 1 drop into both eyes at bedtime.    . valsartan-hydrochlorothiazide (DIOVAN-HCT) 160-25 MG tablet Take 1 tablet by mouth daily. 90 tablet 3  . vancomycin (VANCOCIN) 50 mg/mL oral solution Take 2.5 mLs (125 mg total) by mouth every 6 (six) hours for 14 days. 140 mL 0  . vitamin C (ASCORBIC ACID) 500 MG tablet Take 500 mg by mouth daily.     No current facility-administered medications on file prior to visit.     BP 140/70   Pulse 87   Temp 98 F (36.7 C) (Oral)   Ht 5\' 2"  (1.575 m)   Wt 132 lb 4 oz (60 kg)   SpO2 98%   BMI 24.19 kg/m    Objective:   Physical Exam  Constitutional: She is oriented to person, place, and time. She appears well-nourished.  Neck: Neck supple.  Cardiovascular: An irregularly irregular rhythm present.  Mild to moderate lower extremity edema bilaterally, trace pitting to left.  Respiratory: Effort normal and breath sounds normal.  GI: Soft. Bowel sounds are normal. There is no tenderness.  Musculoskeletal:  Using walker in office  Neurological: She is alert and oriented to person, place, and time.  Skin: Skin is warm and dry.  Mild erythema from knees down to feet, seems much improved overall, patient can confirm.  Psychiatric: She has a normal mood and affect.           Assessment & Plan:

## 2017-12-05 NOTE — Patient Instructions (Addendum)
Stop by the lab prior to leaving today. I will notify you of your results once received.   Continue metoprolol succinate 50 mg for now.   Continue Bumex 1 mg daily for now with a goal of decreasing to as needed.   Continue participating in physical therapy.   Start iron (ferrous sulfate 325 mg) every other day for low iron levels.   Follow up with GI as scheduled. Mention the three days of dark, tarry stools.   Please don't hesitate to message me with any questions.   It was a pleasure to see you today!

## 2017-12-05 NOTE — Telephone Encounter (Signed)
EMMI flagged patient for answering yes to feeling sad/hopeless/anxious/empty. Clinical Social Worker (CSW) attempted to contact patient via telephone however she did not answer and a voicemail was left.   Pasha Gadison, LCSW [phone]  

## 2017-12-06 ENCOUNTER — Telehealth: Payer: Self-pay | Admitting: Licensed Clinical Social Worker

## 2017-12-06 DIAGNOSIS — A0472 Enterocolitis due to Clostridium difficile, not specified as recurrent: Secondary | ICD-10-CM | POA: Diagnosis not present

## 2017-12-06 DIAGNOSIS — I10 Essential (primary) hypertension: Secondary | ICD-10-CM | POA: Diagnosis not present

## 2017-12-06 DIAGNOSIS — L03115 Cellulitis of right lower limb: Secondary | ICD-10-CM | POA: Diagnosis not present

## 2017-12-06 DIAGNOSIS — L03116 Cellulitis of left lower limb: Secondary | ICD-10-CM | POA: Diagnosis not present

## 2017-12-06 DIAGNOSIS — L03311 Cellulitis of abdominal wall: Secondary | ICD-10-CM | POA: Diagnosis not present

## 2017-12-06 DIAGNOSIS — A409 Streptococcal sepsis, unspecified: Secondary | ICD-10-CM | POA: Diagnosis not present

## 2017-12-06 LAB — BASIC METABOLIC PANEL
BUN: 18 mg/dL (ref 6–23)
CHLORIDE: 89 meq/L — AB (ref 96–112)
CO2: 29 meq/L (ref 19–32)
CREATININE: 0.93 mg/dL (ref 0.40–1.20)
Calcium: 9.6 mg/dL (ref 8.4–10.5)
GFR: 59.85 mL/min — ABNORMAL LOW (ref >60.00)
GLUCOSE: 94 mg/dL (ref 70–99)
Potassium: 4.7 mEq/L (ref 3.5–5.1)
Sodium: 126 mEq/L — ABNORMAL LOW (ref 135–145)

## 2017-12-06 LAB — CBC WITH DIFFERENTIAL/PLATELET
BASOS ABS: 0.1 10*3/uL (ref 0.0–0.1)
Basophils Relative: 1.3 % (ref 0.0–3.0)
EOS PCT: 3.1 % (ref 0.0–5.0)
Eosinophils Absolute: 0.3 10*3/uL (ref 0.0–0.7)
HEMATOCRIT: 27.7 % — AB (ref 36.0–46.0)
Hemoglobin: 9.3 g/dL — ABNORMAL LOW (ref 12.0–15.0)
LYMPHS PCT: 23.5 % (ref 12.0–46.0)
Lymphs Abs: 2.2 10*3/uL (ref 0.7–4.0)
MCHC: 33.5 g/dL (ref 30.0–36.0)
MCV: 77.6 fl — AB (ref 78.0–100.0)
MONOS PCT: 16.3 % — AB (ref 3.0–12.0)
Monocytes Absolute: 1.5 10*3/uL — ABNORMAL HIGH (ref 0.1–1.0)
Neutro Abs: 5.2 10*3/uL (ref 1.4–7.7)
Neutrophils Relative %: 55.8 % (ref 43.0–77.0)
Platelets: 605 10*3/uL — ABNORMAL HIGH (ref 150.0–400.0)
RBC: 3.56 Mil/uL — AB (ref 3.87–5.11)
RDW: 16.7 % — ABNORMAL HIGH (ref 11.5–15.5)
WBC: 9.3 10*3/uL (ref 4.0–10.5)

## 2017-12-06 NOTE — Assessment & Plan Note (Signed)
Improved. Continue Bumex once daily, plan to wean down to PRN once able. Continue cardiology follow up.

## 2017-12-06 NOTE — Assessment & Plan Note (Signed)
Stable in the office today, continue current regimen. 

## 2017-12-06 NOTE — Assessment & Plan Note (Signed)
Chronic. Back to baseline. Cortisol level normal.

## 2017-12-06 NOTE — Telephone Encounter (Signed)
EMMI flagged patient for answering yes to feeling sad/hopeless/anxious/empty. Clinical Education officer, museum (CSW) attempted to contact patient for a second time via telephone however she did not answer and a voicemail was left.   [employer], LCSW [phone]

## 2017-12-06 NOTE — Assessment & Plan Note (Addendum)
Diagnosed during hospital stay for cellulitis. Due for repeat testing next week. WBC count normal.

## 2017-12-06 NOTE — Assessment & Plan Note (Signed)
Rhythm irregular. Rate stable. Continue Toprol XL 50 mg for now. Continue Xarelto.

## 2017-12-06 NOTE — Assessment & Plan Note (Signed)
Admitted and treated with numerous antibiotics. Exam today with improvement.  WBC count normal. Discussed warning signs of potential systemic infections.  All hospital labs, notes, imaging reviewed.

## 2017-12-07 DIAGNOSIS — H353122 Nonexudative age-related macular degeneration, left eye, intermediate dry stage: Secondary | ICD-10-CM | POA: Diagnosis not present

## 2017-12-07 DIAGNOSIS — H353213 Exudative age-related macular degeneration, right eye, with inactive scar: Secondary | ICD-10-CM | POA: Diagnosis not present

## 2017-12-10 DIAGNOSIS — L03116 Cellulitis of left lower limb: Secondary | ICD-10-CM | POA: Diagnosis not present

## 2017-12-10 DIAGNOSIS — A0472 Enterocolitis due to Clostridium difficile, not specified as recurrent: Secondary | ICD-10-CM | POA: Diagnosis not present

## 2017-12-10 DIAGNOSIS — A409 Streptococcal sepsis, unspecified: Secondary | ICD-10-CM | POA: Diagnosis not present

## 2017-12-10 DIAGNOSIS — L03115 Cellulitis of right lower limb: Secondary | ICD-10-CM | POA: Diagnosis not present

## 2017-12-10 DIAGNOSIS — L03311 Cellulitis of abdominal wall: Secondary | ICD-10-CM | POA: Diagnosis not present

## 2017-12-10 DIAGNOSIS — I10 Essential (primary) hypertension: Secondary | ICD-10-CM | POA: Diagnosis not present

## 2017-12-11 ENCOUNTER — Other Ambulatory Visit: Payer: Self-pay

## 2017-12-11 ENCOUNTER — Encounter: Payer: Self-pay | Admitting: Gastroenterology

## 2017-12-11 ENCOUNTER — Ambulatory Visit (INDEPENDENT_AMBULATORY_CARE_PROVIDER_SITE_OTHER): Payer: Medicare Other | Admitting: Gastroenterology

## 2017-12-11 ENCOUNTER — Other Ambulatory Visit
Admission: RE | Admit: 2017-12-11 | Discharge: 2017-12-11 | Disposition: A | Discharge status: Home / Self Care | Payer: Medicare Other | Source: Ambulatory Visit | Attending: Gastroenterology | Admitting: Gastroenterology

## 2017-12-11 VITALS — BP 124/77 | HR 71 | Ht 62.0 in | Wt 126.2 lb

## 2017-12-11 DIAGNOSIS — A409 Streptococcal sepsis, unspecified: Secondary | ICD-10-CM | POA: Diagnosis not present

## 2017-12-11 DIAGNOSIS — D649 Anemia, unspecified: Secondary | ICD-10-CM | POA: Diagnosis not present

## 2017-12-11 DIAGNOSIS — D509 Iron deficiency anemia, unspecified: Secondary | ICD-10-CM

## 2017-12-11 DIAGNOSIS — L03116 Cellulitis of left lower limb: Secondary | ICD-10-CM | POA: Diagnosis not present

## 2017-12-11 DIAGNOSIS — A0472 Enterocolitis due to Clostridium difficile, not specified as recurrent: Secondary | ICD-10-CM | POA: Diagnosis not present

## 2017-12-11 DIAGNOSIS — I10 Essential (primary) hypertension: Secondary | ICD-10-CM | POA: Diagnosis not present

## 2017-12-11 DIAGNOSIS — E538 Deficiency of other specified B group vitamins: Secondary | ICD-10-CM

## 2017-12-11 DIAGNOSIS — L03311 Cellulitis of abdominal wall: Secondary | ICD-10-CM | POA: Diagnosis not present

## 2017-12-11 DIAGNOSIS — L03115 Cellulitis of right lower limb: Secondary | ICD-10-CM | POA: Diagnosis not present

## 2017-12-11 LAB — IRON AND TIBC
IRON: 31 ug/dL (ref 28–170)
Saturation Ratios: 8 % — ABNORMAL LOW (ref 10.4–31.8)
TIBC: 409 ug/dL (ref 250–450)
UIBC: 378 ug/dL

## 2017-12-11 LAB — FERRITIN: FERRITIN: 27 ng/mL (ref 11–307)

## 2017-12-11 LAB — FOLATE: FOLATE: 54 ng/mL (ref >5.9)

## 2017-12-11 LAB — VITAMIN B12: Vitamin B-12: 708 pg/mL (ref 180–914)

## 2017-12-11 NOTE — Progress Notes (Signed)
Cephas Darby, MD [address]  [place]  [place], [place] [address]  Main: [phone]  Fax: [phone]    Gastroenterology Consultation  Referring Provider:     Pleas Koch, NP Primary Care Physician:  Pleas Koch, NP Primary Gastroenterologist:  Dr. Cephas Darby Reason for Consultation:     Clostridium distal colitis        HPI:   Heather Larson is a 84 y.o. female s seen as a hospital follow-up. Patient was recently discharged from [facility] after being treated for bilateral lower extremity cellulitis, treated with antibiotics and later developed C. Difficile colitis. She was treated with oral vancomycin and IV metronidazole. Discharged home on 2 weeks course of oral vancomycin.   Interval summary: Patient is accompanied by her daughter today who is very involved in her care. Patient's diarrhea has resolved. Currently having one to 2 formed bowel movements daily and sometimes experiences gurgling in her stomach.overall, her energy levels are significantly better, able to socialize with her friends and more active. She started taking iron every other day as recommended by her primary care provider because of microcytic anemia. Patient's hemoglobin was 11.9 in 03/2017 with normal MCV. And her hemoglobin has declined in last 1 month when she was admitted to [facility] with cellulitis and C. Difficile. Her hemoglobin nadir was 8.2, MCV 77 and reactive thrombocytosis. Patient denies having black tarry stools or rectal bleeding. She did have some bloody diarrhea and she has C. Difficile colitis. Her hemoglobin after discharge is 9.3 on 12/05/2017.  NSAIDs: none  Antiplts/Anticoagulants/Anti thrombotics: none  GI Procedures: none  Past Medical History:  Diagnosis Date  . A-fib ([place])   . Bilateral lower extremity edema   . Cancer [facility])    breast cancer at age 60 and skin  . Cellulitis    lower abdomen  .  Hyperlipidemia   . Hypertension   . Hypertension   . Incontinence   . Low sodium levels   . Macular degeneration     Past Surgical History:  Procedure Laterality Date  . BREAST SURGERY     right mastectomy  . EYE SURGERY     x3  . IR RADIOLOGIST EVAL & MGMT  09/17/2017  . MASTECTOMY     right  . SHOULDER OPEN ROTATOR CUFF REPAIR     left  . SKIN BIOPSY     skin cancer    Current Outpatient Medications:  .  acidophilus (RISAQUAD) CAPS capsule, Take 2 capsules by mouth 3 (three) times daily., Disp: 50 capsule, Rfl: 0 .  atorvastatin (LIPITOR) 10 MG tablet, Take 1 tablet (10 mg total) by mouth daily., Disp: 90 tablet, Rfl: 3 .  brimonidine (ALPHAGAN P) 0.1 % SOLN, Place 1 drop into both eyes 2 (two) times daily., Disp: , Rfl:  .  bumetanide (BUMEX) 1 MG tablet, TAKE 1 TABLET(1 MG) BY MOUTH DAILY AS NEEDED. PATIENT TAKES 1/2 TABLET DAILY AS DIRECTED, Disp: 90 tablet, Rfl: 1 .  calcium carbonate (OSCAL) 1500 (600 Ca) MG TABS tablet, Take by mouth 2 (two) times daily with a meal., Disp: , Rfl:  .  feeding supplement, ENSURE ENLIVE, (ENSURE ENLIVE) LIQD, Take 237 mLs by mouth 2 (two) times daily between meals., Disp: 60 Bottle, Rfl: 0 .  ferrous sulfate 325 (65 FE) MG EC tablet, Take 325 mg by mouth every other day., Disp: , Rfl:  .  hydrALAZINE (APRESOLINE) 50 MG tablet, Take 1 tablet (  50 mg total) by mouth 3 (three) times daily., Disp: 270 tablet, Rfl: 3 .  meloxicam (MOBIC) 7.5 MG tablet, Take 7.5 mg by mouth daily., Disp: , Rfl:  .  metoprolol succinate (TOPROL-XL) 25 MG 24 hr tablet, Take 3 tablets (75 mg total) by mouth daily., Disp: 90 tablet, Rfl: 0 .  Multiple Vitamins-Minerals (MULTIVITAMIN ADULT) TABS, Take 1 tablet by mouth daily., Disp: , Rfl:  .  Rivaroxaban (XARELTO) 15 MG TABS tablet, Take 1 tablet (15 mg total) by mouth daily., Disp: 90 tablet, Rfl: 3 .  travoprost, benzalkonium, (TRAVATAN) 0.004 % ophthalmic solution, Place 1 drop into both eyes at bedtime., Disp: , Rfl:   .  valsartan-hydrochlorothiazide (DIOVAN-HCT) 160-25 MG tablet, Take 1 tablet by mouth daily., Disp: 90 tablet, Rfl: 3 .  vancomycin (VANCOCIN) 125 MG capsule, TK 1 C PO Q 6 H FOR 14 DAYS, Disp: , Rfl: 0 .  vitamin C (ASCORBIC ACID) 500 MG tablet, Take 500 mg by mouth daily., Disp: , Rfl:  .  docusate sodium (COLACE) 100 MG capsule, Take 1 capsule (100 mg total) by mouth daily. (Patient not taking: Reported on 12/11/2017), Disp: 10 capsule, Rfl: 0 .  vancomycin (VANCOCIN) 50 mg/mL oral solution, Take 2.5 mLs (125 mg total) by mouth every 6 (six) hours for 14 days. (Patient not taking: Reported on 12/11/2017), Disp: 140 mL, Rfl: 0  History reviewed. No pertinent family history.   Social History   Tobacco Use  . Smoking status: Never Smoker  . Smokeless tobacco: Never Used  Substance Use Topics  . Alcohol use: Not Currently    Alcohol/week: 2.0 standard drinks    Types: 2 Shots of liquor per week    Comment: 2 drinks daily  . Drug use: No    Allergies as of 12/11/2017  . (No Known Allergies)    Review of Systems:    All systems reviewed and negative except where noted in HPI.   Physical Exam:  BP 124/77   Pulse 71   Ht 5\' 2"  (1.575 m)   Wt 126 lb 3.2 oz (57.2 kg)   BMI 23.08 kg/m  No LMP recorded. Patient is postmenopausal.  General:   Alert,  Thin built, appropriate for her age, pleasant and cooperative in NAD Head:  Normocephalic and atraumatic. Eyes:  Sclera clear, no icterus.   Conjunctiva pink. Ears:  Normal auditory acuity. Nose:  No deformity, discharge, or lesions. Mouth:  No deformity or lesions,oropharynx pink & moist. Neck:  Supple; no masses or thyromegaly. Lungs:  Respirations even and unlabored.  Clear throughout to auscultation.   No wheezes, crackles, or rhonchi. No acute distress. Heart:  Regular rate and rhythm; no murmurs, clicks, rubs, or gallops. Abdomen:  Normal bowel sounds. Soft, non-tender and non-distended without masses, hepatosplenomegaly or  hernias noted.  No guarding or rebound tenderness.   Rectal: Not performed Msk:  Symmetrical without gross deformities. Good, equal movement & strength bilaterally. Pulses:  Normal pulses noted. Extremities:  No clubbing or edema.  No cyanosis. Neurologic:  Alert and oriented x3;  grossly normal neurologically. Skin:  Intact without significant lesions or rashes. No jaundice. Lymph Nodes:  No significant cervical adenopathy. Psych:  Alert and cooperative. Normal mood and affect.  Imaging Studies: No abdominal imaging  Assessment and Plan:   Heather Larson is a 84 y.o. Caucasian female with history of A. Fib on rivaroxaban, hypertension with recent admission to [facility] in 10/2017 with lower extremity cellulitis and subsequently developed C.  Difficile in the hospital status post treatment with oral vancomycin and metronidazole. Diarrhea has resolved. She is finishing the course of oral vancomycin of 2 weeks.  C. Difficile colitis: Complete 2 weeks course of oral vancomycin  Microcytic anemia: Continue oral iron for now Check iron studies, B12, folate levels   Follow up in 2 months   Cephas Darby, MD

## 2017-12-12 DIAGNOSIS — H401132 Primary open-angle glaucoma, bilateral, moderate stage: Secondary | ICD-10-CM | POA: Diagnosis not present

## 2017-12-13 DIAGNOSIS — L03115 Cellulitis of right lower limb: Secondary | ICD-10-CM | POA: Diagnosis not present

## 2017-12-13 DIAGNOSIS — A0472 Enterocolitis due to Clostridium difficile, not specified as recurrent: Secondary | ICD-10-CM | POA: Diagnosis not present

## 2017-12-13 DIAGNOSIS — I10 Essential (primary) hypertension: Secondary | ICD-10-CM | POA: Diagnosis not present

## 2017-12-13 DIAGNOSIS — L03311 Cellulitis of abdominal wall: Secondary | ICD-10-CM | POA: Diagnosis not present

## 2017-12-13 DIAGNOSIS — L03116 Cellulitis of left lower limb: Secondary | ICD-10-CM | POA: Diagnosis not present

## 2017-12-13 DIAGNOSIS — A409 Streptococcal sepsis, unspecified: Secondary | ICD-10-CM | POA: Diagnosis not present

## 2017-12-14 DIAGNOSIS — L03311 Cellulitis of abdominal wall: Secondary | ICD-10-CM | POA: Diagnosis not present

## 2017-12-14 DIAGNOSIS — A409 Streptococcal sepsis, unspecified: Secondary | ICD-10-CM | POA: Diagnosis not present

## 2017-12-14 DIAGNOSIS — L03115 Cellulitis of right lower limb: Secondary | ICD-10-CM | POA: Diagnosis not present

## 2017-12-14 DIAGNOSIS — A0472 Enterocolitis due to Clostridium difficile, not specified as recurrent: Secondary | ICD-10-CM | POA: Diagnosis not present

## 2017-12-14 DIAGNOSIS — L03116 Cellulitis of left lower limb: Secondary | ICD-10-CM | POA: Diagnosis not present

## 2017-12-14 DIAGNOSIS — I10 Essential (primary) hypertension: Secondary | ICD-10-CM | POA: Diagnosis not present

## 2017-12-18 DIAGNOSIS — L03115 Cellulitis of right lower limb: Secondary | ICD-10-CM | POA: Diagnosis not present

## 2017-12-18 DIAGNOSIS — A409 Streptococcal sepsis, unspecified: Secondary | ICD-10-CM | POA: Diagnosis not present

## 2017-12-18 DIAGNOSIS — L03116 Cellulitis of left lower limb: Secondary | ICD-10-CM | POA: Diagnosis not present

## 2017-12-18 DIAGNOSIS — A0472 Enterocolitis due to Clostridium difficile, not specified as recurrent: Secondary | ICD-10-CM | POA: Diagnosis not present

## 2017-12-18 DIAGNOSIS — I10 Essential (primary) hypertension: Secondary | ICD-10-CM | POA: Diagnosis not present

## 2017-12-18 DIAGNOSIS — L03311 Cellulitis of abdominal wall: Secondary | ICD-10-CM | POA: Diagnosis not present

## 2017-12-19 DIAGNOSIS — A0472 Enterocolitis due to Clostridium difficile, not specified as recurrent: Secondary | ICD-10-CM | POA: Diagnosis not present

## 2017-12-19 DIAGNOSIS — L03311 Cellulitis of abdominal wall: Secondary | ICD-10-CM | POA: Diagnosis not present

## 2017-12-19 DIAGNOSIS — L03116 Cellulitis of left lower limb: Secondary | ICD-10-CM | POA: Diagnosis not present

## 2017-12-19 DIAGNOSIS — L03115 Cellulitis of right lower limb: Secondary | ICD-10-CM | POA: Diagnosis not present

## 2017-12-19 DIAGNOSIS — I10 Essential (primary) hypertension: Secondary | ICD-10-CM | POA: Diagnosis not present

## 2017-12-19 DIAGNOSIS — A409 Streptococcal sepsis, unspecified: Secondary | ICD-10-CM | POA: Diagnosis not present

## 2017-12-20 DIAGNOSIS — A0472 Enterocolitis due to Clostridium difficile, not specified as recurrent: Secondary | ICD-10-CM | POA: Diagnosis not present

## 2017-12-20 DIAGNOSIS — L03115 Cellulitis of right lower limb: Secondary | ICD-10-CM | POA: Diagnosis not present

## 2017-12-20 DIAGNOSIS — I10 Essential (primary) hypertension: Secondary | ICD-10-CM | POA: Diagnosis not present

## 2017-12-20 DIAGNOSIS — L03116 Cellulitis of left lower limb: Secondary | ICD-10-CM | POA: Diagnosis not present

## 2017-12-20 DIAGNOSIS — L03311 Cellulitis of abdominal wall: Secondary | ICD-10-CM | POA: Diagnosis not present

## 2017-12-20 DIAGNOSIS — A409 Streptococcal sepsis, unspecified: Secondary | ICD-10-CM | POA: Diagnosis not present

## 2017-12-21 DIAGNOSIS — A0472 Enterocolitis due to Clostridium difficile, not specified as recurrent: Secondary | ICD-10-CM | POA: Diagnosis not present

## 2017-12-21 DIAGNOSIS — L03115 Cellulitis of right lower limb: Secondary | ICD-10-CM | POA: Diagnosis not present

## 2017-12-21 DIAGNOSIS — L03116 Cellulitis of left lower limb: Secondary | ICD-10-CM | POA: Diagnosis not present

## 2017-12-21 DIAGNOSIS — I10 Essential (primary) hypertension: Secondary | ICD-10-CM | POA: Diagnosis not present

## 2017-12-21 DIAGNOSIS — L03311 Cellulitis of abdominal wall: Secondary | ICD-10-CM | POA: Diagnosis not present

## 2017-12-21 DIAGNOSIS — A409 Streptococcal sepsis, unspecified: Secondary | ICD-10-CM | POA: Diagnosis not present

## 2017-12-24 DIAGNOSIS — L03311 Cellulitis of abdominal wall: Secondary | ICD-10-CM | POA: Diagnosis not present

## 2017-12-24 DIAGNOSIS — L03116 Cellulitis of left lower limb: Secondary | ICD-10-CM | POA: Diagnosis not present

## 2017-12-24 DIAGNOSIS — L03115 Cellulitis of right lower limb: Secondary | ICD-10-CM | POA: Diagnosis not present

## 2017-12-24 DIAGNOSIS — A0472 Enterocolitis due to Clostridium difficile, not specified as recurrent: Secondary | ICD-10-CM | POA: Diagnosis not present

## 2017-12-24 DIAGNOSIS — I10 Essential (primary) hypertension: Secondary | ICD-10-CM | POA: Diagnosis not present

## 2017-12-24 DIAGNOSIS — A409 Streptococcal sepsis, unspecified: Secondary | ICD-10-CM | POA: Diagnosis not present

## 2017-12-25 DIAGNOSIS — L03311 Cellulitis of abdominal wall: Secondary | ICD-10-CM | POA: Diagnosis not present

## 2017-12-25 DIAGNOSIS — L03115 Cellulitis of right lower limb: Secondary | ICD-10-CM | POA: Diagnosis not present

## 2017-12-25 DIAGNOSIS — I10 Essential (primary) hypertension: Secondary | ICD-10-CM | POA: Diagnosis not present

## 2017-12-25 DIAGNOSIS — A0472 Enterocolitis due to Clostridium difficile, not specified as recurrent: Secondary | ICD-10-CM | POA: Diagnosis not present

## 2017-12-25 DIAGNOSIS — A409 Streptococcal sepsis, unspecified: Secondary | ICD-10-CM | POA: Diagnosis not present

## 2017-12-25 DIAGNOSIS — L03116 Cellulitis of left lower limb: Secondary | ICD-10-CM | POA: Diagnosis not present

## 2017-12-26 DIAGNOSIS — L03311 Cellulitis of abdominal wall: Secondary | ICD-10-CM | POA: Diagnosis not present

## 2017-12-26 DIAGNOSIS — A409 Streptococcal sepsis, unspecified: Secondary | ICD-10-CM | POA: Diagnosis not present

## 2017-12-26 DIAGNOSIS — L03115 Cellulitis of right lower limb: Secondary | ICD-10-CM | POA: Diagnosis not present

## 2017-12-26 DIAGNOSIS — A0472 Enterocolitis due to Clostridium difficile, not specified as recurrent: Secondary | ICD-10-CM | POA: Diagnosis not present

## 2017-12-26 DIAGNOSIS — L03116 Cellulitis of left lower limb: Secondary | ICD-10-CM | POA: Diagnosis not present

## 2017-12-26 DIAGNOSIS — I10 Essential (primary) hypertension: Secondary | ICD-10-CM | POA: Diagnosis not present

## 2017-12-27 DIAGNOSIS — I10 Essential (primary) hypertension: Secondary | ICD-10-CM | POA: Diagnosis not present

## 2017-12-27 DIAGNOSIS — L03311 Cellulitis of abdominal wall: Secondary | ICD-10-CM | POA: Diagnosis not present

## 2017-12-27 DIAGNOSIS — A0472 Enterocolitis due to Clostridium difficile, not specified as recurrent: Secondary | ICD-10-CM | POA: Diagnosis not present

## 2017-12-27 DIAGNOSIS — L03115 Cellulitis of right lower limb: Secondary | ICD-10-CM | POA: Diagnosis not present

## 2017-12-27 DIAGNOSIS — L03116 Cellulitis of left lower limb: Secondary | ICD-10-CM | POA: Diagnosis not present

## 2017-12-27 DIAGNOSIS — A409 Streptococcal sepsis, unspecified: Secondary | ICD-10-CM | POA: Diagnosis not present

## 2017-12-28 DIAGNOSIS — L03311 Cellulitis of abdominal wall: Secondary | ICD-10-CM | POA: Diagnosis not present

## 2017-12-28 DIAGNOSIS — A0472 Enterocolitis due to Clostridium difficile, not specified as recurrent: Secondary | ICD-10-CM | POA: Diagnosis not present

## 2017-12-28 DIAGNOSIS — I10 Essential (primary) hypertension: Secondary | ICD-10-CM | POA: Diagnosis not present

## 2017-12-28 DIAGNOSIS — A409 Streptococcal sepsis, unspecified: Secondary | ICD-10-CM | POA: Diagnosis not present

## 2017-12-28 DIAGNOSIS — L03116 Cellulitis of left lower limb: Secondary | ICD-10-CM | POA: Diagnosis not present

## 2017-12-28 DIAGNOSIS — L03115 Cellulitis of right lower limb: Secondary | ICD-10-CM | POA: Diagnosis not present

## 2018-01-01 DIAGNOSIS — L03115 Cellulitis of right lower limb: Secondary | ICD-10-CM | POA: Diagnosis not present

## 2018-01-01 DIAGNOSIS — L03311 Cellulitis of abdominal wall: Secondary | ICD-10-CM | POA: Diagnosis not present

## 2018-01-01 DIAGNOSIS — A409 Streptococcal sepsis, unspecified: Secondary | ICD-10-CM | POA: Diagnosis not present

## 2018-01-01 DIAGNOSIS — I10 Essential (primary) hypertension: Secondary | ICD-10-CM | POA: Diagnosis not present

## 2018-01-01 DIAGNOSIS — A0472 Enterocolitis due to Clostridium difficile, not specified as recurrent: Secondary | ICD-10-CM | POA: Diagnosis not present

## 2018-01-01 DIAGNOSIS — L03116 Cellulitis of left lower limb: Secondary | ICD-10-CM | POA: Diagnosis not present

## 2018-01-02 DIAGNOSIS — H401132 Primary open-angle glaucoma, bilateral, moderate stage: Secondary | ICD-10-CM | POA: Diagnosis not present

## 2018-01-03 DIAGNOSIS — L03311 Cellulitis of abdominal wall: Secondary | ICD-10-CM | POA: Diagnosis not present

## 2018-01-03 DIAGNOSIS — A0472 Enterocolitis due to Clostridium difficile, not specified as recurrent: Secondary | ICD-10-CM | POA: Diagnosis not present

## 2018-01-03 DIAGNOSIS — L03116 Cellulitis of left lower limb: Secondary | ICD-10-CM | POA: Diagnosis not present

## 2018-01-03 DIAGNOSIS — L03115 Cellulitis of right lower limb: Secondary | ICD-10-CM | POA: Diagnosis not present

## 2018-01-03 DIAGNOSIS — A409 Streptococcal sepsis, unspecified: Secondary | ICD-10-CM | POA: Diagnosis not present

## 2018-01-03 DIAGNOSIS — I10 Essential (primary) hypertension: Secondary | ICD-10-CM | POA: Diagnosis not present

## 2018-01-04 DIAGNOSIS — L03311 Cellulitis of abdominal wall: Secondary | ICD-10-CM | POA: Diagnosis not present

## 2018-01-04 DIAGNOSIS — I10 Essential (primary) hypertension: Secondary | ICD-10-CM | POA: Diagnosis not present

## 2018-01-04 DIAGNOSIS — A409 Streptococcal sepsis, unspecified: Secondary | ICD-10-CM | POA: Diagnosis not present

## 2018-01-04 DIAGNOSIS — L03116 Cellulitis of left lower limb: Secondary | ICD-10-CM | POA: Diagnosis not present

## 2018-01-04 DIAGNOSIS — L03115 Cellulitis of right lower limb: Secondary | ICD-10-CM | POA: Diagnosis not present

## 2018-01-04 DIAGNOSIS — A0472 Enterocolitis due to Clostridium difficile, not specified as recurrent: Secondary | ICD-10-CM | POA: Diagnosis not present

## 2018-01-08 DIAGNOSIS — L03116 Cellulitis of left lower limb: Secondary | ICD-10-CM | POA: Diagnosis not present

## 2018-01-08 DIAGNOSIS — L03311 Cellulitis of abdominal wall: Secondary | ICD-10-CM | POA: Diagnosis not present

## 2018-01-08 DIAGNOSIS — A0472 Enterocolitis due to Clostridium difficile, not specified as recurrent: Secondary | ICD-10-CM | POA: Diagnosis not present

## 2018-01-08 DIAGNOSIS — L03115 Cellulitis of right lower limb: Secondary | ICD-10-CM | POA: Diagnosis not present

## 2018-01-08 DIAGNOSIS — A409 Streptococcal sepsis, unspecified: Secondary | ICD-10-CM | POA: Diagnosis not present

## 2018-01-08 DIAGNOSIS — I10 Essential (primary) hypertension: Secondary | ICD-10-CM | POA: Diagnosis not present

## 2018-01-09 DIAGNOSIS — L03116 Cellulitis of left lower limb: Secondary | ICD-10-CM | POA: Diagnosis not present

## 2018-01-09 DIAGNOSIS — L03115 Cellulitis of right lower limb: Secondary | ICD-10-CM | POA: Diagnosis not present

## 2018-01-09 DIAGNOSIS — A0472 Enterocolitis due to Clostridium difficile, not specified as recurrent: Secondary | ICD-10-CM | POA: Diagnosis not present

## 2018-01-09 DIAGNOSIS — A409 Streptococcal sepsis, unspecified: Secondary | ICD-10-CM | POA: Diagnosis not present

## 2018-01-09 DIAGNOSIS — L03311 Cellulitis of abdominal wall: Secondary | ICD-10-CM | POA: Diagnosis not present

## 2018-01-09 DIAGNOSIS — I10 Essential (primary) hypertension: Secondary | ICD-10-CM | POA: Diagnosis not present

## 2018-01-10 DIAGNOSIS — I10 Essential (primary) hypertension: Secondary | ICD-10-CM | POA: Diagnosis not present

## 2018-01-10 DIAGNOSIS — A409 Streptococcal sepsis, unspecified: Secondary | ICD-10-CM | POA: Diagnosis not present

## 2018-01-10 DIAGNOSIS — L03311 Cellulitis of abdominal wall: Secondary | ICD-10-CM | POA: Diagnosis not present

## 2018-01-10 DIAGNOSIS — L03115 Cellulitis of right lower limb: Secondary | ICD-10-CM | POA: Diagnosis not present

## 2018-01-10 DIAGNOSIS — A0472 Enterocolitis due to Clostridium difficile, not specified as recurrent: Secondary | ICD-10-CM | POA: Diagnosis not present

## 2018-01-10 DIAGNOSIS — L03116 Cellulitis of left lower limb: Secondary | ICD-10-CM | POA: Diagnosis not present

## 2018-01-11 ENCOUNTER — Emergency Department: Payer: Medicare Other

## 2018-01-11 ENCOUNTER — Encounter: Payer: Self-pay | Admitting: Primary Care

## 2018-01-11 ENCOUNTER — Ambulatory Visit (INDEPENDENT_AMBULATORY_CARE_PROVIDER_SITE_OTHER)
Admission: RE | Admit: 2018-01-11 | Discharge: 2018-01-11 | Disposition: A | Discharge status: Home / Self Care | Payer: Medicare Other | Source: Ambulatory Visit | Attending: Primary Care | Admitting: Primary Care

## 2018-01-11 ENCOUNTER — Telehealth: Payer: Self-pay

## 2018-01-11 ENCOUNTER — Inpatient Hospital Stay
Admission: EM | Admit: 2018-01-11 | Discharge: 2018-01-14 | DRG: 372 | Disposition: A | Discharge status: Home / Self Care | Payer: Medicare Other | Attending: Internal Medicine | Admitting: Internal Medicine

## 2018-01-11 ENCOUNTER — Telehealth: Payer: Self-pay | Admitting: Primary Care

## 2018-01-11 ENCOUNTER — Ambulatory Visit (INDEPENDENT_AMBULATORY_CARE_PROVIDER_SITE_OTHER): Payer: Medicare Other | Admitting: Primary Care

## 2018-01-11 ENCOUNTER — Other Ambulatory Visit: Payer: Self-pay

## 2018-01-11 ENCOUNTER — Encounter: Payer: Self-pay | Admitting: *Deleted

## 2018-01-11 VITALS — BP 110/66 | HR 133 | Temp 98.8°F | Ht 62.0 in | Wt 127.0 lb

## 2018-01-11 DIAGNOSIS — H3530 Unspecified macular degeneration: Secondary | ICD-10-CM | POA: Diagnosis present

## 2018-01-11 DIAGNOSIS — R600 Localized edema: Secondary | ICD-10-CM | POA: Diagnosis not present

## 2018-01-11 DIAGNOSIS — R195 Other fecal abnormalities: Secondary | ICD-10-CM | POA: Diagnosis not present

## 2018-01-11 DIAGNOSIS — I482 Chronic atrial fibrillation, unspecified: Secondary | ICD-10-CM

## 2018-01-11 DIAGNOSIS — R140 Abdominal distension (gaseous): Secondary | ICD-10-CM

## 2018-01-11 DIAGNOSIS — A498 Other bacterial infections of unspecified site: Secondary | ICD-10-CM | POA: Diagnosis not present

## 2018-01-11 DIAGNOSIS — A419 Sepsis, unspecified organism: Secondary | ICD-10-CM

## 2018-01-11 DIAGNOSIS — R32 Unspecified urinary incontinence: Secondary | ICD-10-CM | POA: Diagnosis present

## 2018-01-11 DIAGNOSIS — Z66 Do not resuscitate: Secondary | ICD-10-CM | POA: Diagnosis present

## 2018-01-11 DIAGNOSIS — R197 Diarrhea, unspecified: Secondary | ICD-10-CM

## 2018-01-11 DIAGNOSIS — E871 Hypo-osmolality and hyponatremia: Secondary | ICD-10-CM | POA: Diagnosis present

## 2018-01-11 DIAGNOSIS — L03115 Cellulitis of right lower limb: Secondary | ICD-10-CM

## 2018-01-11 DIAGNOSIS — L03116 Cellulitis of left lower limb: Secondary | ICD-10-CM

## 2018-01-11 DIAGNOSIS — H409 Unspecified glaucoma: Secondary | ICD-10-CM | POA: Diagnosis present

## 2018-01-11 DIAGNOSIS — I10 Essential (primary) hypertension: Secondary | ICD-10-CM | POA: Diagnosis present

## 2018-01-11 DIAGNOSIS — Z85828 Personal history of other malignant neoplasm of skin: Secondary | ICD-10-CM | POA: Diagnosis not present

## 2018-01-11 DIAGNOSIS — E785 Hyperlipidemia, unspecified: Secondary | ICD-10-CM | POA: Diagnosis present

## 2018-01-11 DIAGNOSIS — R5383 Other fatigue: Secondary | ICD-10-CM

## 2018-01-11 DIAGNOSIS — Z79899 Other long term (current) drug therapy: Secondary | ICD-10-CM

## 2018-01-11 DIAGNOSIS — A0472 Enterocolitis due to Clostridium difficile, not specified as recurrent: Principal | ICD-10-CM | POA: Diagnosis present

## 2018-01-11 DIAGNOSIS — Z9011 Acquired absence of right breast and nipple: Secondary | ICD-10-CM | POA: Diagnosis not present

## 2018-01-11 DIAGNOSIS — Z8619 Personal history of other infectious and parasitic diseases: Secondary | ICD-10-CM

## 2018-01-11 DIAGNOSIS — Z853 Personal history of malignant neoplasm of breast: Secondary | ICD-10-CM | POA: Diagnosis not present

## 2018-01-11 DIAGNOSIS — A09 Infectious gastroenteritis and colitis, unspecified: Secondary | ICD-10-CM | POA: Diagnosis not present

## 2018-01-11 DIAGNOSIS — M810 Age-related osteoporosis without current pathological fracture: Secondary | ICD-10-CM | POA: Diagnosis present

## 2018-01-11 DIAGNOSIS — Z7901 Long term (current) use of anticoagulants: Secondary | ICD-10-CM

## 2018-01-11 DIAGNOSIS — M7989 Other specified soft tissue disorders: Secondary | ICD-10-CM | POA: Diagnosis present

## 2018-01-11 HISTORY — DX: Diarrhea, unspecified: R19.7

## 2018-01-11 LAB — POC URINALSYSI DIPSTICK (AUTOMATED)
BILIRUBIN UA: NEGATIVE
GLUCOSE UA: NEGATIVE
KETONES UA: NEGATIVE
Nitrite, UA: NEGATIVE
Protein, UA: NEGATIVE
RBC UA: NEGATIVE
SPEC GRAV UA: 1.015 (ref 1.010–1.025)
Urobilinogen, UA: 0.2 E.U./dL
pH, UA: 6 (ref 5.0–8.0)

## 2018-01-11 LAB — CBC WITH DIFFERENTIAL/PLATELET
BAND NEUTROPHILS: 5 %
BASOS ABS: 0.2 10*3/uL — AB (ref 0.0–0.1)
BASOS PCT: 0 %
Basophils Absolute: 0 10*3/uL (ref 0–0.1)
Basophils Relative: 0.6 % (ref 0.0–3.0)
Blasts: 0 %
EOS ABS: 0 10*3/uL (ref 0–0.7)
EOS PCT: 0 %
Eosinophils Absolute: 0.1 10*3/uL (ref 0.0–0.7)
Eosinophils Relative: 0.2 % (ref 0.0–5.0)
HCT: 29.4 % — ABNORMAL LOW (ref 35.0–47.0)
HEMATOCRIT: 30 % — AB (ref 36.0–46.0)
Hemoglobin: 9.8 g/dL — ABNORMAL LOW (ref 12.0–15.0)
Hemoglobin: 9.8 g/dL — ABNORMAL LOW (ref 12.0–16.0)
LYMPHS PCT: 9 %
Lymphocytes Relative: 3.8 % — ABNORMAL LOW (ref 12.0–46.0)
Lymphs Abs: 1.6 10*3/uL (ref 0.7–4.0)
Lymphs Abs: 3.7 10*3/uL — ABNORMAL HIGH (ref 1.0–3.6)
MCH: 26 pg (ref 26.0–34.0)
MCHC: 32.7 g/dL (ref 30.0–36.0)
MCHC: 33.3 g/dL (ref 32.0–36.0)
MCV: 77.9 fl — ABNORMAL LOW (ref 78.0–100.0)
MCV: 78.3 fL — ABNORMAL LOW (ref 80.0–100.0)
MONO ABS: 2.4 10*3/uL — AB (ref 0.1–1.0)
MONO ABS: 2 10*3/uL — AB (ref 0.2–0.9)
MONOS PCT: 5 %
Metamyelocytes Relative: 0 %
Monocytes Relative: 5.9 % (ref 3.0–12.0)
Myelocytes: 0 %
NEUTROS ABS: 34.9 10*3/uL — AB (ref 1.4–6.5)
NEUTROS PCT: 81 %
NRBC: 0 /100{WBCs}
Neutro Abs: 37.1 10*3/uL — ABNORMAL HIGH (ref 1.4–7.7)
Neutrophils Relative %: 89.5 % — ABNORMAL HIGH (ref 43.0–77.0)
OTHER: 0 %
PLATELETS: 459 10*3/uL — AB (ref 150.0–400.0)
Platelets: 416 10*3/uL (ref 150–440)
Promyelocytes Relative: 0 %
RBC: 3.85 Mil/uL — ABNORMAL LOW (ref 3.87–5.11)
RBC: 3.75 MIL/uL — ABNORMAL LOW (ref 3.80–5.20)
RDW: 17.1 % — ABNORMAL HIGH (ref 11.5–15.5)
RDW: 17 % — AB (ref 11.5–14.5)
WBC: 40.6 10*3/uL — ABNORMAL HIGH (ref 3.6–11.0)

## 2018-01-11 LAB — COMPREHENSIVE METABOLIC PANEL
ALBUMIN: 3.2 g/dL — AB (ref 3.5–5.0)
ALT: 16 U/L (ref 0–44)
ANION GAP: 9 (ref 5–15)
AST: 23 U/L (ref 15–41)
Alkaline Phosphatase: 90 U/L (ref 38–126)
BUN: 24 mg/dL — AB (ref 8–23)
CHLORIDE: 86 mmol/L — AB (ref 98–111)
CO2: 24 mmol/L (ref 22–32)
Calcium: 8.5 mg/dL — ABNORMAL LOW (ref 8.9–10.3)
Creatinine, Ser: 0.86 mg/dL (ref 0.44–1.00)
GFR calc Af Amer: >60 mL/min (ref >60)
GFR calc non Af Amer: 57 mL/min — ABNORMAL LOW (ref >60)
GLUCOSE: 108 mg/dL — AB (ref 70–99)
POTASSIUM: 3 mmol/L — AB (ref 3.5–5.1)
SODIUM: 119 mmol/L — AB (ref 135–145)
Total Bilirubin: 0.7 mg/dL (ref 0.3–1.2)
Total Protein: 6.4 g/dL — ABNORMAL LOW (ref 6.5–8.1)

## 2018-01-11 LAB — PROTIME-INR
INR: 2.1
PROTHROMBIN TIME: 23.4 s — AB (ref 11.4–15.2)

## 2018-01-11 LAB — C DIFFICILE QUICK SCREEN W PCR REFLEX
C DIFFICLE (CDIFF) ANTIGEN: POSITIVE — AB
C Diff toxin: NEGATIVE

## 2018-01-11 LAB — LACTIC ACID, PLASMA
LACTIC ACID, VENOUS: 1 mmol/L (ref 0.5–1.9)
Lactic Acid, Venous: 0.9 mmol/L (ref 0.5–1.9)

## 2018-01-11 LAB — LIPASE, BLOOD: Lipase: 26 U/L (ref 11–51)

## 2018-01-11 LAB — BASIC METABOLIC PANEL
BUN: 22 mg/dL (ref 6–23)
CHLORIDE: 85 meq/L — AB (ref 96–112)
CO2: 24 mEq/L (ref 19–32)
Calcium: 9.2 mg/dL (ref 8.4–10.5)
Creatinine, Ser: 0.89 mg/dL (ref 0.40–1.20)
GFR: 62.96 mL/min (ref >60.00)
Glucose, Bld: 118 mg/dL — ABNORMAL HIGH (ref 70–99)
POTASSIUM: 3.6 meq/L (ref 3.5–5.1)
SODIUM: 119 meq/L — AB (ref 135–145)

## 2018-01-11 LAB — CLOSTRIDIUM DIFFICILE BY PCR, REFLEXED: Toxigenic C. Difficile by PCR: POSITIVE — AB

## 2018-01-11 MED ORDER — VITAMIN C 500 MG PO TABS
500.0000 mg | ORAL_TABLET | Freq: Every day | ORAL | Status: DC
  Administered 2018-01-12 – 2018-01-14 (×3): 500 mg via ORAL
  Filled 2018-01-11 (×3): qty 1

## 2018-01-11 MED ORDER — ADULT MULTIVITAMIN W/MINERALS CH
1.0000 | ORAL_TABLET | Freq: Every day | ORAL | Status: DC
  Administered 2018-01-12 – 2018-01-14 (×3): 1 via ORAL
  Filled 2018-01-11 (×3): qty 1

## 2018-01-11 MED ORDER — ONDANSETRON HCL 4 MG/2ML IJ SOLN: 4.0000 mg | Freq: Four times a day (QID) | INTRAMUSCULAR | Status: DC | PRN

## 2018-01-11 MED ORDER — HYDRALAZINE HCL 50 MG PO TABS
50.0000 mg | ORAL_TABLET | Freq: Three times a day (TID) | ORAL | Status: DC
  Administered 2018-01-12 – 2018-01-14 (×3): 50 mg via ORAL
  Filled 2018-01-11 (×6): qty 1

## 2018-01-11 MED ORDER — SODIUM CHLORIDE 0.9 % IV SOLN
INTRAVENOUS | Status: DC
  Administered 2018-01-12 – 2018-01-13 (×3): via INTRAVENOUS

## 2018-01-11 MED ORDER — ENSURE ENLIVE PO LIQD
237.0000 mL | Freq: Two times a day (BID) | ORAL | Status: DC
  Administered 2018-01-12 – 2018-01-14 (×5): 237 mL via ORAL

## 2018-01-11 MED ORDER — VANCOMYCIN 50 MG/ML ORAL SOLUTION
125.0000 mg | Freq: Four times a day (QID) | ORAL | Status: DC
  Administered 2018-01-12 – 2018-01-14 (×11): 125 mg via ORAL
  Filled 2018-01-11 (×11): qty 2.5

## 2018-01-11 MED ORDER — TRAVOPROST 0.004 % OP SOLN: 1.0000 [drp] | Freq: Every day | OPHTHALMIC | Status: DC

## 2018-01-11 MED ORDER — ONDANSETRON HCL 4 MG PO TABS: 4.0000 mg | ORAL_TABLET | Freq: Four times a day (QID) | ORAL | Status: DC | PRN

## 2018-01-11 MED ORDER — RISAQUAD PO CAPS
2.0000 | ORAL_CAPSULE | Freq: Three times a day (TID) | ORAL | Status: DC
  Administered 2018-01-12 – 2018-01-14 (×7): 2 via ORAL
  Filled 2018-01-11 (×10): qty 2

## 2018-01-11 MED ORDER — MELATONIN 5 MG PO TABS
5.0000 mg | ORAL_TABLET | Freq: Every evening | ORAL | Status: DC | PRN
  Administered 2018-01-12 – 2018-01-13 (×2): 5 mg via ORAL
  Filled 2018-01-11 (×4): qty 1

## 2018-01-11 MED ORDER — METOPROLOL SUCCINATE ER 25 MG PO TB24
75.0000 mg | ORAL_TABLET | Freq: Every day | ORAL | Status: DC
  Administered 2018-01-12 – 2018-01-14 (×3): 75 mg via ORAL
  Filled 2018-01-11 (×3): qty 3

## 2018-01-11 MED ORDER — BRIMONIDINE TARTRATE 0.2 % OP SOLN
1.0000 [drp] | Freq: Three times a day (TID) | OPHTHALMIC | Status: DC
  Administered 2018-01-12 – 2018-01-14 (×6): 1 [drp] via OPHTHALMIC
  Filled 2018-01-11: qty 5

## 2018-01-11 MED ORDER — SODIUM CHLORIDE 0.9 % IV BOLUS
250.0000 mL | Freq: Once | INTRAVENOUS | Status: AC
  Administered 2018-01-11: 250 mL via INTRAVENOUS

## 2018-01-11 MED ORDER — ENOXAPARIN SODIUM 40 MG/0.4ML ~~LOC~~ SOLN
40.0000 mg | SUBCUTANEOUS | Status: DC
  Filled 2018-01-11: qty 0.4

## 2018-01-11 MED ORDER — ACETAMINOPHEN 650 MG RE SUPP: 650.0000 mg | Freq: Four times a day (QID) | RECTAL | Status: DC | PRN

## 2018-01-11 MED ORDER — LATANOPROST 0.005 % OP SOLN
1.0000 [drp] | Freq: Every day | OPHTHALMIC | Status: DC
  Administered 2018-01-12 – 2018-01-13 (×2): 1 [drp] via OPHTHALMIC
  Filled 2018-01-11: qty 2.5

## 2018-01-11 MED ORDER — ACETAMINOPHEN 325 MG PO TABS: 650.0000 mg | ORAL_TABLET | Freq: Four times a day (QID) | ORAL | Status: DC | PRN

## 2018-01-11 MED ORDER — CALCIUM CARBONATE ANTACID 500 MG PO CHEW: 1500.0000 mg | CHEWABLE_TABLET | Freq: Two times a day (BID) | ORAL | Status: DC

## 2018-01-11 MED ORDER — ATORVASTATIN CALCIUM 20 MG PO TABS
10.0000 mg | ORAL_TABLET | Freq: Every day | ORAL | Status: DC
  Administered 2018-01-12 – 2018-01-14 (×3): 10 mg via ORAL
  Filled 2018-01-11 (×3): qty 1

## 2018-01-11 MED ORDER — METOPROLOL SUCCINATE ER 25 MG PO TB24: 75.0000 mg | ORAL_TABLET | Freq: Every day | ORAL | 0 refills | Status: DC

## 2018-01-11 MED ORDER — BRIMONIDINE TARTRATE 0.15 % OP SOLN
1.0000 [drp] | Freq: Two times a day (BID) | OPHTHALMIC | Status: DC
  Filled 2018-01-11: qty 5

## 2018-01-11 MED ORDER — VANCOMYCIN 50 MG/ML ORAL SOLUTION
125.0000 mg | ORAL | Status: AC
  Administered 2018-01-11: 125 mg via ORAL
  Filled 2018-01-11 (×2): qty 2.5

## 2018-01-11 MED ORDER — RIVAROXABAN 15 MG PO TABS
15.0000 mg | ORAL_TABLET | Freq: Every day | ORAL | Status: DC
  Administered 2018-01-12 – 2018-01-14 (×3): 15 mg via ORAL
  Filled 2018-01-11 (×3): qty 1

## 2018-01-11 NOTE — Assessment & Plan Note (Signed)
No obvious cellulitis during visit today.

## 2018-01-11 NOTE — ED Triage Notes (Signed)
PT to ED due to abnormal blood work with PCP today. Increased fatigue per pt. No cough, congestion, fevers or changes in urine. Pt also reporting diarrhea and distended abd with a hx of Cdif. Diarrhea q 2-3 hours per Pt.

## 2018-01-11 NOTE — Telephone Encounter (Signed)
Copied from [place] [phone]. Topic: Quick Communication - See Telephone Encounter >> Jan 11, 2018  7:48 AM Marja Kays F wrote: Pt daughter called and needed an appt with Alma Friendly and they discussed her condition on mychart last night and they are trying to avoid a hospital stay and clark told her to call this morning to get an appt  But schedule is full for [facility] to schedule is it possible to work in   [place] number [phone]  or cell [phone] >> Jan 11, 2018  8:58 AM Helene Shoe, LPN wrote: I spoke with [name] and scheduled appt to see Gentry Fitz NP per Clearence Cheek instruction today at 2:20. [name] voiced understanding. If pt condition worsens prior to appt to go to ED. [name] said she was encouraged pt dressed and went to dining room this AM for breakfast.

## 2018-01-11 NOTE — Telephone Encounter (Signed)
[facility] lab called with critical results. WBC 41.5 Sodium 119 Results given verbally to Alma Friendly, NP.

## 2018-01-11 NOTE — ED Provider Notes (Signed)
[facility] Emergency Department Provider Note   ____________________________________________   First MD Initiated Contact with Patient 01/11/18 1955     (approximate)  I have reviewed the triage vital signs and the nursing notes.   HISTORY  Chief Complaint Hyponatremia and Elevated WBC   HPI Heather Larson is a 84 y.o. female for evaluation because of concern for diarrhea and low sodium  Patient is here with her daughter.  They reports she is had about 3 days of very loose stool happening every 2-3 hours.  Saw her doctor today and has been feeling fatigued, they told her to come in because her sodium was low and they were concerned she may have redeveloped an infection.  She reports she had been treated for cellulitis that is improved.  She also developed C. difficile that had very similar symptoms in July.  She was treated successfully at that time.  She does not have any pain but reports she is having frequent stools every 2-3 hours and when they come they are very hard to control and very watery.  No trouble breathing.  No fevers.  Does feel fatigued.  Not been able to keep up well with eating because of the amount of stooling  Past Medical History:  Diagnosis Date  . A-fib ([place])   . Bilateral lower extremity edema   . Cancer [facility])    breast cancer at age 49 and skin  . Cellulitis    lower abdomen  . Hyperlipidemia   . Hypertension   . Hypertension   . Incontinence   . Low sodium levels   . Macular degeneration     Patient Active Problem List   Diagnosis Date Noted  . Diarrhea 01/11/2018  . Malnutrition of moderate degree 11/23/2017  . C. difficile colitis   . Bilateral lower leg cellulitis 11/11/2017  . Hyponatremia 08/06/2017  . Bilateral lower extremity edema 07/31/2017  . Macular degeneration 06/15/2017  . Chronic atrial fibrillation ([place]) 06/15/2017  . Essential hypertension 06/15/2017  . Urinary incontinence 06/15/2017  .  Osteoporosis 06/15/2017  . Hyperlipemia 06/15/2017  . Glaucoma of both eyes 10/08/2014  . Personal history of other malignant neoplasm of skin 05/13/2014  . Wrist fracture, closed 05/11/2014  . Closed head injury 10/28/2013  . Hand laceration 10/28/2013    Past Surgical History:  Procedure Laterality Date  . BREAST SURGERY     right mastectomy  . EYE SURGERY     x3  . IR RADIOLOGIST EVAL & MGMT  09/17/2017  . MASTECTOMY     right  . SHOULDER OPEN ROTATOR CUFF REPAIR     left  . SKIN BIOPSY     skin cancer    Prior to Admission medications   Medication Sig Start Date End Date Taking? Authorizing Provider  atorvastatin (LIPITOR) 10 MG tablet Take 1 tablet (10 mg total) by mouth daily. 10/02/17  Yes Gollan, Kathlene November, MD  brimonidine (ALPHAGAN) 0.2 % ophthalmic solution Place 1 drop into both eyes 3 (three) times daily. 01/02/18  Yes [provider]  bumetanide (BUMEX) 1 MG tablet TAKE 1 TABLET(1 MG) BY MOUTH DAILY AS NEEDED. PATIENT TAKES 1/2 TABLET DAILY AS DIRECTED 11/29/17  Yes Gollan, Kathlene November, MD  calcium carbonate (OSCAL) 1500 (600 Ca) MG TABS tablet Take by mouth 2 (two) times daily with a meal.   Yes [provider]  dorzolamide (TRUSOPT) 2 % ophthalmic solution Place 1 drop into both eyes 3 (three) times daily. 01/02/18  Yes [provider]  hydrALAZINE (APRESOLINE) 50 MG tablet Take 1 tablet (50 mg total) by mouth 3 (three) times daily. 10/02/17  Yes Gollan, Kathlene November, MD  meloxicam (MOBIC) 7.5 MG tablet Take 7.5 mg by mouth daily.   Yes [provider]  metoprolol succinate (TOPROL-XL) 25 MG 24 hr tablet Take 3 tablets (75 mg total) by mouth daily. 01/11/18  Yes Pleas Koch, NP  Multiple Vitamins-Minerals (MULTIVITAMIN ADULT) TABS Take 1 tablet by mouth daily.   Yes [provider]  Rivaroxaban (XARELTO) 15 MG TABS tablet Take 1 tablet (15 mg total) by mouth daily. 10/02/17  Yes Gollan, Kathlene November, MD  valsartan-hydrochlorothiazide  (DIOVAN-HCT) 160-25 MG tablet Take 1 tablet by mouth daily. 10/02/17  Yes Gollan, Kathlene November, MD  vitamin C (ASCORBIC ACID) 500 MG tablet Take 500 mg by mouth daily.   Yes [provider]  acidophilus (RISAQUAD) CAPS capsule Take 2 capsules by mouth 3 (three) times daily. Patient not taking: Reported on 01/11/2018 11/27/17   Nicholes Mango, MD  docusate sodium (COLACE) 100 MG capsule Take 1 capsule (100 mg total) by mouth daily. Patient not taking: Reported on 01/11/2018 04/16/17   Dorie Rank, MD  feeding supplement, ENSURE ENLIVE, (ENSURE ENLIVE) LIQD Take 237 mLs by mouth 2 (two) times daily between meals. 11/27/17   Nicholes Mango, MD    Allergies Patient has no known allergies.  History reviewed. No pertinent family history.  Social History Social History   Tobacco Use  . Smoking status: Never Smoker  . Smokeless tobacco: Never Used  Substance Use Topics  . Alcohol use: Not Currently    Alcohol/week: 2.0 standard drinks    Types: 2 Shots of liquor per week    Comment: 2 drinks daily  . Drug use: No    Review of Systems Constitutional: No fever/chills Eyes: No visual changes. ENT: No sore throat.  Dry mouth. Cardiovascular: Denies chest pain. Respiratory: Denies shortness of breath. Gastrointestinal: No abdominal pain.  Feels bloated and gassy however.  Frequent diarrhea Genitourinary: Negative for dysuria. Musculoskeletal: Negative for back pain. Skin: Negative for rash. Neurological: Negative for headaches, focal weakness or numbness.    ____________________________________________   PHYSICAL EXAM:  VITAL SIGNS: ED Triage Vitals  Enc Vitals Group     BP 01/11/18 1801 (!) 116/52     Pulse Rate 01/11/18 1801 (!) 103     Resp 01/11/18 1801 18     Temp 01/11/18 1801 98.1 F (36.7 C)     Temp Source 01/11/18 1801 Oral     SpO2 01/11/18 1801 97 %     Weight 01/11/18 1820 127 lb (57.6 kg)     Height 01/11/18 1820 5\' 2"  (1.575 m)     Head Circumference --       Peak Flow --      Pain Score 01/11/18 1820 0     Pain Loc --      Pain Edu? --      Excl. in [place]? --     Constitutional: Alert and oriented.  Moderately ill-appearing, appears fatigued but in no acute distress.  She is well oriented and very pleasant.  She and her daughter both very pleasant. Eyes: Conjunctivae are normal. Head: Atraumatic. Nose: No congestion/rhinnorhea. Mouth/Throat: Mucous membranes are dry. Neck: No stridor.  No JVD cardiovascular: Slightly tachycardic rate, regular rhythm. Grossly normal heart sounds.  Good peripheral circulation. Respiratory: Normal respiratory effort.  No retractions. Lungs CTAB. Gastrointestinal: Soft and nontender.  Just  slightly distended but soft and nontender.  Slightly increased bowel sounds Musculoskeletal: No lower extremity tenderness nor edema. Neurologic:  Normal speech and language. No gross focal neurologic deficits are appreciated.  Skin:  Skin is warm, dry and intact. No rash noted. Psychiatric: Mood and affect are normal. Speech and behavior are normal.  ____________________________________________   LABS (all labs ordered are listed, but only abnormal results are displayed)  Labs Reviewed  C DIFFICILE QUICK SCREEN W PCR REFLEX - Abnormal; Notable for the following components:      Result Value   C Diff antigen POSITIVE (*)    All other components within normal limits  CLOSTRIDIUM DIFFICILE BY PCR, REFLEXED - Abnormal; Notable for the following components:   Toxigenic C. Difficile by PCR POSITIVE (*)    All other components within normal limits  COMPREHENSIVE METABOLIC PANEL - Abnormal; Notable for the following components:   Sodium 119 (*)    Potassium 3.0 (*)    Chloride 86 (*)    Glucose, Bld 108 (*)    BUN 24 (*)    Calcium 8.5 (*)    Total Protein 6.4 (*)    Albumin 3.2 (*)    GFR calc non Af Amer 57 (*)    All other components within normal limits  CBC WITH DIFFERENTIAL/PLATELET - Abnormal; Notable for the  following components:   WBC 40.6 (*)    RBC 3.75 (*)    Hemoglobin 9.8 (*)    HCT 29.4 (*)    MCV 78.3 (*)    RDW 17.0 (*)    Neutro Abs 34.9 (*)    Lymphs Abs 3.7 (*)    Monocytes Absolute 2.0 (*)    All other components within normal limits  PROTIME-INR - Abnormal; Notable for the following components:   Prothrombin Time 23.4 (*)    All other components within normal limits  CULTURE, BLOOD (ROUTINE X 2)  CULTURE, BLOOD (ROUTINE X 2)  GASTROINTESTINAL PANEL BY PCR, STOOL (REPLACES STOOL CULTURE)  LACTIC ACID, PLASMA  LACTIC ACID, PLASMA  LIPASE, BLOOD  CBC  BASIC METABOLIC PANEL   ____________________________________________  EKG  Reviewed by me and interpreted at 1810 Heart rate 110 QRS 80 QTc 430 A. fib, no evidence of acute ischemia ____________________________________________  RADIOLOGY  Dg Chest Portable 1 View  Result Date: 01/11/2018 CLINICAL DATA:  Sepsis EXAM: PORTABLE CHEST 1 VIEW COMPARISON:  11/11/2017 FINDINGS: Lungs are under aerated with bibasilar atelectasis. Mild cardiomegaly and vascular congestion. No interstitial edema. No pneumothorax. IMPRESSION: Cardiomegaly and vascular congestion.  Bibasilar atelectasis. Electronically Signed   By: Marybelle Killings M.D.   On: 01/11/2018 20:51   Dg Abd 2 Views  Result Date: 01/11/2018 CLINICAL DATA:  Abdominal bloating with mild fecal incontinence. EXAM: ABDOMEN - 2 VIEW COMPARISON:  11/24/2017 FINDINGS: Air is present throughout the colon. There are a few air-filled minimally dilated small bowel loops over the left mid abdomen. No free peritoneal air. A few nonspecific air-fluid levels mostly over the colon. Remainder of the exam is unchanged. IMPRESSION: Few air-filled minimally dilated small bowel loops in the left mid abdomen with air throughout the colon. Likely due to mild ileus, although early/partial small bowel obstruction is possible. Electronically Signed   By: Marin Olp M.D.   On: 01/11/2018 20:26       ____________________________________________   PROCEDURES  Procedure(s) performed: None  Procedures  Critical Care performed: Yes, see critical care note(s)  CRITICAL CARE Performed by: Delman Kitten   Total critical  care time: 38 minutes  Critical care time was exclusive of separately billable procedures and treating other patients.  Critical care was necessary to treat or prevent imminent or life-threatening deterioration.  Critical care was time spent personally by me on the following activities: development of treatment plan with patient and/or surrogate as well as nursing, discussions with consultants, evaluation of patient's response to treatment, examination of patient, obtaining history from patient or surrogate, ordering and performing treatments and interventions, ordering and review of laboratory studies, ordering and review of radiographic studies, pulse oximetry and re-evaluation of patient's condition.  ____________________________________________   INITIAL IMPRESSION / ASSESSMENT AND PLAN / ED COURSE  Pertinent labs & imaging results that were available during my care of the patient were reviewed by me and considered in my medical decision making (see chart for details).      Patient presents for evaluation of diarrhea, fatigue and feeling dehydrated.  Given her clinical history and previous history I highly suspect this is recurrence C. difficile infection though certainly other infectious etiologies are strongly considered.  No abdominal pain or vomiting, and this would argue against the possibility of an acute obstruction.  At this point, discussed with hospitalist and given the patient is noted hyponatremia we will start with gentle hydration 250 mL bolus, start on oral vancomycin, obtain cultures, stool cultures, chest x-ray and further evaluation admit to hospital for further work-up.  Meets sepsis criteria.  Hyponatremia also requiring admission with  associated fatigue.  No seizures or alteration in mental status this time.  ____________________________________________   FINAL CLINICAL IMPRESSION(S) / ED DIAGNOSES  Final diagnoses:  Hyponatremia  Sepsis, due to unspecified organism ([place])  Diarrhea of presumed infectious origin      NEW MEDICATIONS STARTED DURING THIS VISIT:  Current Discharge Medication List       Note:  This document was prepared using Dragon voice recognition software and may include unintentional dictation errors.     Delman Kitten, MD 01/11/18 [phone]

## 2018-01-11 NOTE — Progress Notes (Signed)
Advanced care plan.  Purpose of the Encounter: CODE STATUS  Parties in Attendance: Patient herself  Patient's Decision Capacity: Intact Subjective/Patient's story: Patient is 84 year old with history of atrial fibrillation, hypertension, hyperlipidemia who is presenting to the ED with C. difficile colitis   Objective/Medical story  Patient has previous history of DNR status however no DNR forms are present I discussed with the patient regarding her wishes again she states that she does not want to be resuscitated from a cardiac or pulmonary standpoint  Goals of care determination:   Full code  CODE STATUS: Full code   Time spent discussing advanced care planning: 16 minutes

## 2018-01-11 NOTE — Progress Notes (Addendum)
Subjective:    Patient ID: Heather Larson, female    DOB: 07-14-1923, 84 y.o.   MRN: [id number]  HPI  Heather Larson is a 84 year old female with a recent history of sepsis due to bilateral lower extremity cellulitis and then subsequent c-difficile who presents today with her daughter with a chief complaint of lethergy.  Her daughter sent a message through the my chart portal reporting her mother is experiencing symptoms of tachycardia, decrease in appetite, feeling sleeping, and feeling lethargic. She was strongly advised to schedule an appointment.   Symptoms began yesterday with feeling tired and lethargic. She did participate in outpatient PT but didn't feel the best. They did more sitting exercises rather than standing. She's been told several times by her home health PT and nurse that her heart rate has been "fast and irregular". Today she's feeling better but still feels lethargic and daughter endorses that she's moving slow. She did go downstairs for breakfast in the dinning hall this morning. Her daughter states that she received a phone call from the patient Wednesday night and she asked her daughter if it was day or night and if she had missed breakfast. She also endorses abdominal bloating with mild liquid fecal incontinence with urination.   She denies fevers, dysuria, urinary frequency, foul smelling urine, hematuria, chest pain, palpitations, cough, congestion. She's getting around 16 ounces of water daily. Also drinking coffee and lemonade. She is compliant to her metoprolol succinate 50 mg daily.   BP Readings from Last 3 Encounters:  01/11/18 (!) 116/52  01/11/18 110/66  12/11/17 124/77       Review of Systems  Constitutional: Positive for fatigue. Negative for fever.  HENT: Negative for congestion.   Eyes: Negative for visual disturbance.  Respiratory: Negative for cough and shortness of breath.   Cardiovascular: Negative for chest pain.  Gastrointestinal: Negative for  abdominal pain, nausea and vomiting.  Neurological: Positive for weakness. Negative for dizziness and headaches.  Psychiatric/Behavioral: Positive for confusion.       Past Medical History:  Diagnosis Date  . A-fib ([place])   . Bilateral lower extremity edema   . Cancer [facility])    breast cancer at age 84 and skin  . Cellulitis    lower abdomen  . Hyperlipidemia   . Hypertension   . Hypertension   . Incontinence   . Low sodium levels   . Macular degeneration      Social History   Socioeconomic History  . Marital status: Divorced    Spouse name: Not on file  . Number of children: Not on file  . Years of education: Not on file  . Highest education level: Not on file  Occupational History  . Not on file  Social Needs  . Financial resource strain: Not on file  . Food insecurity:    Worry: Not on file    Inability: Not on file  . Transportation needs:    Medical: Not on file    Non-medical: Not on file  Tobacco Use  . Smoking status: Never Smoker  . Smokeless tobacco: Never Used  Substance and Sexual Activity  . Alcohol use: Not Currently    Alcohol/week: 2.0 standard drinks    Types: 2 Shots of liquor per week    Comment: 2 drinks daily  . Drug use: No  . Sexual activity: Not on file  Lifestyle  . Physical activity:    Days per week: Not on file    Minutes per  session: Not on file  . Stress: Not on file  Relationships  . Social connections:    Talks on phone: Not on file    Gets together: Not on file    Attends religious service: Not on file    Active member of club or organization: Not on file    Attends meetings of clubs or organizations: Not on file    Relationship status: Not on file  . Intimate partner violence:    Fear of current or ex partner: Not on file    Emotionally abused: Not on file    Physically abused: Not on file    Forced sexual activity: Not on file  Other Topics Concern  . Not on file  Social History Narrative   Moved from [place].     Past Surgical History:  Procedure Laterality Date  . BREAST SURGERY     right mastectomy  . EYE SURGERY     x3  . IR RADIOLOGIST EVAL & MGMT  09/17/2017  . MASTECTOMY     right  . SHOULDER OPEN ROTATOR CUFF REPAIR     left  . SKIN BIOPSY     skin cancer    No family history on file.  No Known Allergies  Current Outpatient Medications on File Prior to Visit  Medication Sig Dispense Refill  . acidophilus (RISAQUAD) CAPS capsule Take 2 capsules by mouth 3 (three) times daily. 50 capsule 0  . atorvastatin (LIPITOR) 10 MG tablet Take 1 tablet (10 mg total) by mouth daily. 90 tablet 3  . brimonidine (ALPHAGAN P) 0.1 % SOLN Place 1 drop into both eyes 2 (two) times daily.    . bumetanide (BUMEX) 1 MG tablet TAKE 1 TABLET(1 MG) BY MOUTH DAILY AS NEEDED. PATIENT TAKES 1/2 TABLET DAILY AS DIRECTED 90 tablet 1  . calcium carbonate (OSCAL) 1500 (600 Ca) MG TABS tablet Take by mouth 2 (two) times daily with a meal.    . docusate sodium (COLACE) 100 MG capsule Take 1 capsule (100 mg total) by mouth daily. 10 capsule 0  . feeding supplement, ENSURE ENLIVE, (ENSURE ENLIVE) LIQD Take 237 mLs by mouth 2 (two) times daily between meals. 60 Bottle 0  . ferrous sulfate 325 (65 FE) MG EC tablet Take 325 mg by mouth every other day.    . hydrALAZINE (APRESOLINE) 50 MG tablet Take 1 tablet (50 mg total) by mouth 3 (three) times daily. 270 tablet 3  . meloxicam (MOBIC) 7.5 MG tablet Take 7.5 mg by mouth daily.    . Multiple Vitamins-Minerals (MULTIVITAMIN ADULT) TABS Take 1 tablet by mouth daily.    . Rivaroxaban (XARELTO) 15 MG TABS tablet Take 1 tablet (15 mg total) by mouth daily. 90 tablet 3  . travoprost, benzalkonium, (TRAVATAN) 0.004 % ophthalmic solution Place 1 drop into both eyes at bedtime.    . valsartan-hydrochlorothiazide (DIOVAN-HCT) 160-25 MG tablet Take 1 tablet by mouth daily. 90 tablet 3  . vitamin C (ASCORBIC ACID) 500 MG tablet Take 500 mg by mouth daily.     No current  facility-administered medications on file prior to visit.     BP 110/66 (BP Location: Left Arm, Patient Position: Sitting, Cuff Size: Normal)   Pulse (!) 133   Temp 98.8 F (37.1 C) (Oral)   Ht 5\' 2"  (1.575 m)   Wt 127 lb (57.6 kg)   SpO2 98%   BMI 23.23 kg/m    Objective:   Physical Exam  Constitutional: She is oriented to  person, place, and time. She appears well-nourished. She does not have a sickly appearance.  Neck: Neck supple.  Cardiovascular: An irregularly irregular rhythm present. Tachycardia present.  Bilateral lower extremities with mild swelling without increased erythema   Respiratory: Effort normal and breath sounds normal. She has no wheezes. She has no rales.  GI: Soft. Bowel sounds are normal. She exhibits distension. There is no tenderness.  Neurological: She is alert and oriented to person, place, and time.  Skin: Skin is warm and dry.           Assessment & Plan:  Lethargy:  Also with one episode of confusion, fatigue, decrease in appetite. UA today with 1+ leuks, otherwise unremarkable. Culture sent,  She appears well, not sickly, and is ambulatory and alert and oriented during the visit. Lungs clear.  Check labs today including CBC, BMP.  Check abdominal plain films to rule out blockage. Tachycardia coupled with recent symptoms could very well be due to underlying infection and potential sepsis.  Strict ED precautions provided.  Update: Labs with severe leukocytosis with left shift, hyponatremia. History of severe leukocytosis from prior hospital stay, could be a return of C-diff. Consider leukemia. Spoke with both daughter and patient via phone and advised ED evaluation stat. They agreed and will go to [facility] ED. Spoke with Marya Amsler, RN at [facility] ED and notified him of patient's expected arrival.  Pleas Koch, NP

## 2018-01-11 NOTE — Assessment & Plan Note (Signed)
Stable on exam today. Appears euvolemic.

## 2018-01-11 NOTE — Telephone Encounter (Signed)
Spoke with patient and her daughter regarding lab results and advised the go to the nearest emergency department. They will go to [facility] ED. Given CBC result, tachycardia, symptoms of lethargy, recent history of sepsis she needs admission and treatment for possible sepsis. Spoke with Marya Amsler, RN from [facility] ED discussing situation and labs.

## 2018-01-11 NOTE — ED Notes (Signed)
Message sent to pharmacy as vanco still has not been received

## 2018-01-11 NOTE — Telephone Encounter (Signed)
Noted  

## 2018-01-11 NOTE — Progress Notes (Signed)
CODE SEPSIS - PHARMACY COMMUNICATION  **Broad Spectrum Antibiotics should be administered within 1 hour of Sepsis diagnosis**  Time Code Sepsis Called/Page Received:   9/13 @ 20:00    Antibiotics Ordered:   Vancomycin Oral Susp   Time of 1st antibiotic administration: 9/13 @ 2124   Additional action taken by pharmacy:  Called RN around 2100 to remind her to give abx.   If necessary, Name of Provider/Nurse Contacted:     Pat Elicker D ,PharmD Clinical Pharmacist  01/11/2018  9:55 PM

## 2018-01-11 NOTE — Assessment & Plan Note (Addendum)
Rate and rhythm irregular and with tachycardia. Based off of HPI she could be having atrial fibrillation with RVR without noticing and also outside of current suspected illness.   Will have her daughter start monitoring HR and BP closely, discussed to increase metoprolol succinate back up to 75 for now. Await lab results.   Update: Lab results with severe leukocytosis and left shift. Tachycardia today likely from systemic infection.

## 2018-01-11 NOTE — ED Notes (Signed)
Quale MD at bedside

## 2018-01-11 NOTE — H&P (Signed)
[facility] at [place] NAME: Heather Larson    MR#:  [id number]  DATE OF BIRTH:  [birth year]  DATE OF ADMISSION:  01/11/2018  PRIMARY CARE PHYSICIAN: Pleas Koch, NP   REQUESTING/REFERRING PHYSICIAN: Delman Kitten, MD  CHIEF COMPLAINT:   Chief Complaint  Patient presents with  . Hyponatremia  . Elevated WBC    HISTORY OF PRESENT ILLNESS: Heather Larson  is a 84 y.o. female with a known history of patient's 84 year old with history of C. difficile colitis in the past who is presenting again with complaint of diarrhea.  Patient primary care provider checked her blood work and showed white blood cell count that was very high.  Therefore she sent to the ER.  Patient reports that she has had diarrhea ongoing for the past few days.  She describes it as liquidy and foul-smelling.  She has not had any fevers or chills.  She does complain of weakness.  PAST MEDICAL HISTORY:   Past Medical History:  Diagnosis Date  . A-fib ([place])   . Bilateral lower extremity edema   . Cancer [facility])    breast cancer at age 12 and skin  . Cellulitis    lower abdomen  . Hyperlipidemia   . Hypertension   . Hypertension   . Incontinence   . Low sodium levels   . Macular degeneration     PAST SURGICAL HISTORY:  Past Surgical History:  Procedure Laterality Date  . BREAST SURGERY     right mastectomy  . EYE SURGERY     x3  . IR RADIOLOGIST EVAL & MGMT  09/17/2017  . MASTECTOMY     right  . SHOULDER OPEN ROTATOR CUFF REPAIR     left  . SKIN BIOPSY     skin cancer    SOCIAL HISTORY:  Social History   Tobacco Use  . Smoking status: Never Smoker  . Smokeless tobacco: Never Used  Substance Use Topics  . Alcohol use: Not Currently    Alcohol/week: 2.0 standard drinks    Types: 2 Shots of liquor per week    Comment: 2 drinks daily    FAMILY HISTORY: History reviewed. No pertinent family history.  DRUG ALLERGIES: No Known Allergies  REVIEW OF  SYSTEMS:   CONSTITUTIONAL: No fever, fatigue or weakness.  EYES: No blurred or double vision.  EARS, NOSE, AND THROAT: No tinnitus or ear pain.  RESPIRATORY: No cough, shortness of breath, wheezing or hemoptysis.  CARDIOVASCULAR: No chest pain, orthopnea, edema.  GASTROINTESTINAL: No nausea, vomiting, positive diarrhea or abdominal pain.  GENITOURINARY: No dysuria, hematuria.  ENDOCRINE: No polyuria, nocturia,  HEMATOLOGY: No anemia, easy bruising or bleeding SKIN: No rash or lesion. MUSCULOSKELETAL: No joint pain or arthritis.   NEUROLOGIC: No tingling, numbness, weakness.  PSYCHIATRY: No anxiety or depression.   MEDICATIONS AT HOME:  Prior to Admission medications   Medication Sig Start Date End Date Taking? Authorizing Provider  acidophilus (RISAQUAD) CAPS capsule Take 2 capsules by mouth 3 (three) times daily. 11/27/17   Gouru, Illene Silver, MD  atorvastatin (LIPITOR) 10 MG tablet Take 1 tablet (10 mg total) by mouth daily. 10/02/17   Minna Merritts, MD  brimonidine (ALPHAGAN P) 0.1 % SOLN Place 1 drop into both eyes 2 (two) times daily.    [provider]  bumetanide (BUMEX) 1 MG tablet TAKE 1 TABLET(1 MG) BY MOUTH DAILY AS NEEDED. PATIENT TAKES 1/2 TABLET DAILY AS DIRECTED 11/29/17   Minna Merritts,  MD  calcium carbonate (OSCAL) 1500 (600 Ca) MG TABS tablet Take by mouth 2 (two) times daily with a meal.    [provider]  docusate sodium (COLACE) 100 MG capsule Take 1 capsule (100 mg total) by mouth daily. 04/16/17   Dorie Rank, MD  feeding supplement, ENSURE ENLIVE, (ENSURE ENLIVE) LIQD Take 237 mLs by mouth 2 (two) times daily between meals. 11/27/17   Gouru, Illene Silver, MD  ferrous sulfate 325 (65 FE) MG EC tablet Take 325 mg by mouth every other day.    [provider]  hydrALAZINE (APRESOLINE) 50 MG tablet Take 1 tablet (50 mg total) by mouth 3 (three) times daily. 10/02/17   Minna Merritts, MD  meloxicam (MOBIC) 7.5 MG tablet Take 7.5 mg by mouth daily.     [provider]  metoprolol succinate (TOPROL-XL) 25 MG 24 hr tablet Take 3 tablets (75 mg total) by mouth daily. 01/11/18   Pleas Koch, NP  Multiple Vitamins-Minerals (MULTIVITAMIN ADULT) TABS Take 1 tablet by mouth daily.    [provider]  Rivaroxaban (XARELTO) 15 MG TABS tablet Take 1 tablet (15 mg total) by mouth daily. 10/02/17   Minna Merritts, MD  travoprost, benzalkonium, (TRAVATAN) 0.004 % ophthalmic solution Place 1 drop into both eyes at bedtime.    [provider]  valsartan-hydrochlorothiazide (DIOVAN-HCT) 160-25 MG tablet Take 1 tablet by mouth daily. 10/02/17   Minna Merritts, MD  vitamin C (ASCORBIC ACID) 500 MG tablet Take 500 mg by mouth daily.    [provider]      PHYSICAL EXAMINATION:   VITAL SIGNS: Blood pressure (!) 116/52, pulse (!) 103, temperature 98.1 F (36.7 C), temperature source Oral, resp. rate 18, height 5\' 2"  (1.575 m), weight 57.6 kg, SpO2 97 %.  GENERAL:  84 y.o.-year-old patient lying in the bed with no acute distress.  EYES: Pupils equal, round, reactive to light and accommodation. No scleral icterus. Extraocular muscles intact.  HEENT: Head atraumatic, normocephalic. Oropharynx and nasopharynx clear.  NECK:  Supple, no jugular venous distention. No thyroid enlargement, no tenderness.  LUNGS: Normal breath sounds bilaterally, no wheezing, rales,rhonchi or crepitation. No use of accessory muscles of respiration.  CARDIOVASCULAR: Irregularly irregular no murmurs, rubs, or gallops.  ABDOMEN: Soft, nontender, nondistended. Bowel sounds present. No organomegaly or mass.  EXTREMITIES: No pedal edema, cyanosis, or clubbing.  NEUROLOGIC: Cranial nerves II through XII are intact. Muscle strength 5/5 in all extremities. Sensation intact. Gait not checked.  PSYCHIATRIC: The patient is alert and oriented x 3.  SKIN: No obvious rash, lesion, or ulcer.   LABORATORY PANEL:   CBC Recent Labs  Lab 01/11/18 1458   WBC 41.5 Repeated and verified X2.*  HGB 9.8*  HCT 30.0*  PLT 459.0*  MCV 77.9*  MCHC 32.7  RDW 17.1*  LYMPHSABS 1.6  MONOABS 2.4*  EOSABS 0.1  BASOSABS 0.2*   ------------------------------------------------------------------------------------------------------------------  Chemistries  Recent Labs  Lab 01/11/18 1458  NA 119*  K 3.6  CL 85*  CO2 24  GLUCOSE 118*  BUN 22  CREATININE 0.89  CALCIUM 9.2   ------------------------------------------------------------------------------------------------------------------ estimated creatinine clearance is 31.9 mL/min (by C-G formula based on SCr of 0.89 mg/dL). ------------------------------------------------------------------------------------------------------------------ No results for input(s): TSH, T4TOTAL, T3FREE, THYROIDAB in the last 72 hours.  Invalid input(s): FREET3   Coagulation profile No results for input(s): INR, PROTIME in the last 168 hours. ------------------------------------------------------------------------------------------------------------------- No results for input(s): DDIMER in the last 72 hours. -------------------------------------------------------------------------------------------------------------------  Cardiac Enzymes No results  for input(s): CKMB, TROPONINI, MYOGLOBIN in the last 168 hours.  Invalid input(s): CK ------------------------------------------------------------------------------------------------------------------ Invalid input(s): POCBNP  ---------------------------------------------------------------------------------------------------------------  Urinalysis    Component Value Date/Time   COLORURINE YELLOW (A) 11/11/2017 1619   APPEARANCEUR CLEAR (A) 11/11/2017 1619   LABSPEC 1.019 11/11/2017 1619   PHURINE 5.0 11/11/2017 1619   GLUCOSEU NEGATIVE 11/11/2017 1619   HGBUR NEGATIVE 11/11/2017 1619   BILIRUBINUR negative 01/11/2018 1519   KETONESUR NEGATIVE  11/11/2017 1619   PROTEINUR Negative 01/11/2018 1519   PROTEINUR NEGATIVE 11/11/2017 1619   UROBILINOGEN 0.2 01/11/2018 1519   NITRITE negative 01/11/2018 1519   NITRITE NEGATIVE 11/11/2017 1619   LEUKOCYTESUR Small (1+) (A) 01/11/2018 1519     RADIOLOGY: Dg Abd 2 Views  Result Date: 01/11/2018 CLINICAL DATA:  Abdominal bloating with mild fecal incontinence. EXAM: ABDOMEN - 2 VIEW COMPARISON:  11/24/2017 FINDINGS: Air is present throughout the colon. There are a few air-filled minimally dilated small bowel loops over the left mid abdomen. No free peritoneal air. A few nonspecific air-fluid levels mostly over the colon. Remainder of the exam is unchanged. IMPRESSION: Few air-filled minimally dilated small bowel loops in the left mid abdomen with air throughout the colon. Likely due to mild ileus, although early/partial small bowel obstruction is possible. Electronically Signed   By: Marin Olp M.D.   On: 01/11/2018 20:26    EKG: Orders placed or performed during the hospital encounter of 01/11/18  . EKG 12-Lead  . EKG 12-Lead  . ED EKG 12-Lead  . ED EKG 12-Lead    IMPRESSION AND PLAN: Patient is 84 year old presenting with diarrhea significantly elevated WBC count with history of C. Difficile  1.  Diarrhea likely due to C. difficile colitis stool for C. difficile pending however WBC count and previous history of C. difficile and now diarrhea consistent with C. Difficile Will start on oral vancomycin if stool for C. difficile is negative we will discontinue this.  2.  Hyponatremia due to dehydration we will give IV fluids monitor sodium  3.  Chronic atrial fibrillation I will continue her metoprolol and Xarelto  4.  Hyperlipidemia continue Lipitor  5.  Chronic lower extremity swelling I will hold her Bumex for now  6.  Hypertension blood pressure is borderline we will hold her blood pressure medications for now    All the records are reviewed and case discussed with ED  provider. Management plans discussed with the patient, family and they are in agreement.  CODE STATUS: Code Status History    Date Active Date Inactive Code Status Order ID Comments User Context   11/11/2017 1804 11/27/2017 2149 DNR [id number]  Hillary Bow, MD ED   11/11/2017 1731 11/11/2017 1804 Full Code [id number]  Hillary Bow, MD ED    Questions for Most Recent Historical Code Status (Order [id number])    Question Answer Comment   In the event of cardiac or respiratory ARREST Do not call a "code blue"    In the event of cardiac or respiratory ARREST Do not perform Intubation, CPR, defibrillation or ACLS    In the event of cardiac or respiratory ARREST Use medication by any route, position, wound care, and other measures to relive pain and suffering. May use oxygen, suction and manual treatment of airway obstruction as needed for comfort.        TOTAL TIME TAKING CARE OF THIS PATIENT: 55 minutes.    Dustin Flock M.D on 01/11/2018 at 8:31 PM  Between 7am to 6pm - Pager - [phone]  After 6pm go to [email or web address] - password [email or web address]  [facility] Office  [phone]  CC: Primary care physician; Pleas Koch, NP

## 2018-01-11 NOTE — Patient Instructions (Addendum)
We need to increase your metoprolol succinate to 75 mg. Take 3 of the 25 mg tablets.   Monitor your blood pressure and heart rate over the weekend as discussed. Please message me Monday with an update.   Stop by the lab prior to leaving today. I will notify you of your results once received.   Make sure to stay hydrated with water.   It was a pleasure to see you today!

## 2018-01-12 LAB — GASTROINTESTINAL PANEL BY PCR, STOOL (REPLACES STOOL CULTURE)

## 2018-01-12 LAB — BASIC METABOLIC PANEL
ANION GAP: 7 (ref 5–15)
BUN: 23 mg/dL (ref 8–23)
CALCIUM: 7.9 mg/dL — AB (ref 8.9–10.3)
CO2: 26 mmol/L (ref 22–32)
Chloride: 91 mmol/L — ABNORMAL LOW (ref 98–111)
Creatinine, Ser: 0.78 mg/dL (ref 0.44–1.00)
GFR calc Af Amer: >60 mL/min (ref >60)
GLUCOSE: 105 mg/dL — AB (ref 70–99)
Potassium: 3 mmol/L — ABNORMAL LOW (ref 3.5–5.1)
SODIUM: 124 mmol/L — AB (ref 135–145)

## 2018-01-12 LAB — CBC
HCT: 25.4 % — ABNORMAL LOW (ref 35.0–47.0)
Hemoglobin: 8.7 g/dL — ABNORMAL LOW (ref 12.0–16.0)
MCH: 26.4 pg (ref 26.0–34.0)
MCHC: 34.2 g/dL (ref 32.0–36.0)
MCV: 77.2 fL — AB (ref 80.0–100.0)
PLATELETS: 375 10*3/uL (ref 150–440)
RBC: 3.28 MIL/uL — AB (ref 3.80–5.20)
RDW: 17 % — AB (ref 11.5–14.5)
WBC: 30.1 10*3/uL — AB (ref 3.6–11.0)

## 2018-01-12 NOTE — Progress Notes (Signed)
[facility] at [place] NAME: Heather Larson    MR#:  [id number]  DATE OF BIRTH:  [birth year]  SUBJECTIVE:  CHIEF COMPLAINT:   Chief Complaint  Patient presents with  . Hyponatremia  . Elevated WBC   Came with bloating sensation and elevated white blood cell count.  Has history of C. Difficile. Noted to have C. difficile infection again.  Feels slightly better today.  Has some formed stool as per nurse.  REVIEW OF SYSTEMS:  CONSTITUTIONAL: No fever, fatigue or weakness.  EYES: No blurred or double vision.  EARS, NOSE, AND THROAT: No tinnitus or ear pain.  RESPIRATORY: No cough, shortness of breath, wheezing or hemoptysis.  CARDIOVASCULAR: No chest pain, orthopnea, edema.  GASTROINTESTINAL: No nausea, vomiting, have diarrhea and abdominal pain.  GENITOURINARY: No dysuria, hematuria.  ENDOCRINE: No polyuria, nocturia,  HEMATOLOGY: No anemia, easy bruising or bleeding SKIN: No rash or lesion. MUSCULOSKELETAL: No joint pain or arthritis.   NEUROLOGIC: No tingling, numbness, weakness.  PSYCHIATRY: No anxiety or depression.   ROS  DRUG ALLERGIES:  No Known Allergies  VITALS:  Blood pressure 112/80, pulse 93, temperature 97.7 F (36.5 C), temperature source Oral, resp. rate 18, height 5\' 2"  (1.575 m), weight 57.6 kg, SpO2 100 %.  PHYSICAL EXAMINATION:  GENERAL:  84 y.o.-year-old patient lying in the bed with no acute distress.  EYES: Pupils equal, round, reactive to light and accommodation. No scleral icterus. Extraocular muscles intact.  HEENT: Head atraumatic, normocephalic. Oropharynx and nasopharynx clear.  NECK:  Supple, no jugular venous distention. No thyroid enlargement, no tenderness.  LUNGS: Normal breath sounds bilaterally, no wheezing, rales,rhonchi or crepitation. No use of accessory muscles of respiration.  CARDIOVASCULAR: S1, S2 normal. No murmurs, rubs, or gallops.  ABDOMEN: Soft, tender, nondistended. Bowel sounds present.  No organomegaly or mass.  EXTREMITIES: No pedal edema, cyanosis, or clubbing.  NEUROLOGIC: Cranial nerves II through XII are intact. Muscle strength 5/5 in all extremities. Sensation intact. Gait not checked.  PSYCHIATRIC: The patient is alert and oriented x 3.  SKIN: No obvious rash, lesion, or ulcer.   Physical Exam LABORATORY PANEL:   CBC Recent Labs  Lab 01/12/18 0712  WBC 30.1*  HGB 8.7*  HCT 25.4*  PLT 375   ------------------------------------------------------------------------------------------------------------------  Chemistries  Recent Labs  Lab 01/11/18 2026 01/12/18 0712  NA 119* 124*  K 3.0* 3.0*  CL 86* 91*  CO2 24 26  GLUCOSE 108* 105*  BUN 24* 23  CREATININE 0.86 0.78  CALCIUM 8.5* 7.9*  AST 23  --   ALT 16  --   ALKPHOS 90  --   BILITOT 0.7  --    ------------------------------------------------------------------------------------------------------------------  Cardiac Enzymes No results for input(s): TROPONINI in the last 168 hours. ------------------------------------------------------------------------------------------------------------------  RADIOLOGY:  Dg Chest Portable 1 View  Result Date: 01/11/2018 CLINICAL DATA:  Sepsis EXAM: PORTABLE CHEST 1 VIEW COMPARISON:  11/11/2017 FINDINGS: Lungs are under aerated with bibasilar atelectasis. Mild cardiomegaly and vascular congestion. No interstitial edema. No pneumothorax. IMPRESSION: Cardiomegaly and vascular congestion.  Bibasilar atelectasis. Electronically Signed   By: Heather Larson M.D.   On: 01/11/2018 20:51   Dg Abd 2 Views  Result Date: 01/11/2018 CLINICAL DATA:  Abdominal bloating with mild fecal incontinence. EXAM: ABDOMEN - 2 VIEW COMPARISON:  11/24/2017 FINDINGS: Air is present throughout the colon. There are a few air-filled minimally dilated small bowel loops over the left mid abdomen. No free peritoneal air. A few nonspecific air-fluid levels mostly  over the colon. Remainder of the  exam is unchanged. IMPRESSION: Few air-filled minimally dilated small bowel loops in the left mid abdomen with air throughout the colon. Likely due to mild ileus, although early/partial small bowel obstruction is possible. Electronically Signed   By: Heather Larson M.D.   On: 01/11/2018 20:26    ASSESSMENT AND PLAN:   Active Problems:   Diarrhea  Patient is 84 year old presenting with diarrhea significantly elevated WBC count with history of C. Difficile  1.  Diarrhea due to C. difficile colitis  on oral vancomycin  2.  Hyponatremia due to dehydration we will give IV fluids monitor sodium  3.  Chronic atrial fibrillation  continue her metoprolol and Xarelto  4.  Hyperlipidemia continue Lipitor  5.  Chronic lower extremity swelling   hold her Bumex for now  6.  Hypertension   Cont blood pressure medications for now   All the records are reviewed and case discussed with Care Management/Social Workerr. Management plans discussed with the patient, family and they are in agreement.  CODE STATUS: DNR  TOTAL TIME TAKING CARE OF THIS PATIENT: 35 minutes.   Spoke to patient's daughter on the phone  POSSIBLE D/C IN 1-2 DAYS, DEPENDING ON CLINICAL CONDITION.   Heather Larson M.D on 01/12/2018   Between 7am to 6pm - Pager - [phone]  After 6pm go to [email or web address] - password [email or web address] [facility]  Office  [phone]  CC: Primary care physician; Pleas Koch, NP  Note: This dictation was prepared with Dragon dictation along with smaller phrase technology. Any transcriptional errors that result from this process are unintentional.

## 2018-01-13 ENCOUNTER — Encounter: Payer: Self-pay | Admitting: Gastroenterology

## 2018-01-13 LAB — URINE CULTURE
MICRO NUMBER:: 91100377
SPECIMEN QUALITY:: ADEQUATE

## 2018-01-13 LAB — BASIC METABOLIC PANEL
Anion gap: 9 (ref 5–15)
BUN: 23 mg/dL (ref 8–23)
CHLORIDE: 92 mmol/L — AB (ref 98–111)
CO2: 25 mmol/L (ref 22–32)
CREATININE: 0.72 mg/dL (ref 0.44–1.00)
Calcium: 8.3 mg/dL — ABNORMAL LOW (ref 8.9–10.3)
GFR calc Af Amer: >60 mL/min (ref >60)
GFR calc non Af Amer: >60 mL/min (ref >60)
GLUCOSE: 115 mg/dL — AB (ref 70–99)
Potassium: 3.6 mmol/L (ref 3.5–5.1)
Sodium: 126 mmol/L — ABNORMAL LOW (ref 135–145)

## 2018-01-13 MED ORDER — SODIUM CHLORIDE 0.9 % IV SOLN
INTRAVENOUS | Status: DC
  Administered 2018-01-13 (×2): via INTRAVENOUS

## 2018-01-13 MED ORDER — DIPHENHYDRAMINE HCL 25 MG PO CAPS
50.0000 mg | ORAL_CAPSULE | Freq: Every evening | ORAL | Status: DC | PRN
  Administered 2018-01-13: 50 mg via ORAL
  Filled 2018-01-13: qty 2

## 2018-01-13 NOTE — Progress Notes (Signed)
[facility] at [place] NAME: Heather Larson    MR#:  [id number]  DATE OF BIRTH:  1923-06-11  SUBJECTIVE:  CHIEF COMPLAINT:   Chief Complaint  Patient presents with  . Hyponatremia  . Elevated WBC   84 Came with bloating sensation and elevated white blood cell count.  Has history of C. Difficile. Noted to have C. difficile infection again.  Feels slightly better today.  Has some formed stool as per nurse.  REVIEW OF SYSTEMS:  CONSTITUTIONAL: No fever, fatigue or weakness.  EYES: No blurred or double vision.  EARS, NOSE, AND THROAT: No tinnitus or ear pain.  RESPIRATORY: No cough, shortness of breath, wheezing or hemoptysis.  CARDIOVASCULAR: No chest pain, orthopnea, edema.  GASTROINTESTINAL: No nausea, vomiting, have diarrhea and abdominal pain.  GENITOURINARY: No dysuria, hematuria.  ENDOCRINE: No polyuria, nocturia,  HEMATOLOGY: No anemia, easy bruising or bleeding SKIN: No rash or lesion. MUSCULOSKELETAL: No joint pain or arthritis.   NEUROLOGIC: No tingling, numbness, weakness.  PSYCHIATRY: No anxiety or depression.   ROS  DRUG ALLERGIES:  No Known Allergies  VITALS:  Blood pressure (!) 119/94, pulse 89, temperature 98.4 F (36.9 C), temperature source Oral, resp. rate 18, height 5\' 2"  (1.575 m), weight 57.6 kg, SpO2 97 %.  PHYSICAL EXAMINATION:  GENERAL:  84 y.o.-year-old patient lying in the bed with no acute distress.  EYES: Pupils equal, round, reactive to light and accommodation. No scleral icterus. Extraocular muscles intact.  HEENT: Head atraumatic, normocephalic. Oropharynx and nasopharynx clear.  NECK:  Supple, no jugular venous distention. No thyroid enlargement, no tenderness.  LUNGS: Normal breath sounds bilaterally, no wheezing, rales,rhonchi or crepitation. No use of accessory muscles of respiration.  CARDIOVASCULAR: S1, S2 normal. No murmurs, rubs, or gallops.  ABDOMEN: Soft, tender, nondistended. Bowel sounds  present. No organomegaly or mass.  EXTREMITIES: No pedal edema, cyanosis, or clubbing.  NEUROLOGIC: Cranial nerves II through XII are intact. Muscle strength 4/5 in all extremities. Sensation intact. Gait not checked.  PSYCHIATRIC: The patient is alert and oriented x 3.  SKIN: No obvious rash, lesion, or ulcer.    Physical Exam LABORATORY PANEL:   CBC Recent Labs  Lab 01/12/18 0712  WBC 30.1*  HGB 8.7*  HCT 25.4*  PLT 375   ------------------------------------------------------------------------------------------------------------------  Chemistries  Recent Labs  Lab 01/11/18 2026  01/13/18 0914  NA 119*   < > 126*  K 3.0*   < > 3.6  CL 86*   < > 92*  CO2 24   < > 25  GLUCOSE 108*   < > 115*  BUN 24*   < > 23  CREATININE 0.86   < > 0.72  CALCIUM 8.5*   < > 8.3*  AST 23  --   --   ALT 16  --   --   ALKPHOS 90  --   --   BILITOT 0.7  --   --    < > = values in this interval not displayed.   ------------------------------------------------------------------------------------------------------------------  Cardiac Enzymes No results for input(s): TROPONINI in the last 168 hours. ------------------------------------------------------------------------------------------------------------------  RADIOLOGY:  Dg Chest Portable 1 View  Result Date: 01/11/2018 CLINICAL DATA:  Sepsis EXAM: PORTABLE CHEST 1 VIEW COMPARISON:  11/11/2017 FINDINGS: Lungs are under aerated with bibasilar atelectasis. Mild cardiomegaly and vascular congestion. No interstitial edema. No pneumothorax. IMPRESSION: Cardiomegaly and vascular congestion.  Bibasilar atelectasis. Electronically Signed   By: Heather Larson M.D.   On: 01/11/2018 20:51  Dg Abd 2 Views  Result Date: 01/11/2018 CLINICAL DATA:  Abdominal bloating with mild fecal incontinence. EXAM: ABDOMEN - 2 VIEW COMPARISON:  11/24/2017 FINDINGS: Air is present throughout the colon. There are a few air-filled minimally dilated small bowel loops  over the left mid abdomen. No free peritoneal air. A few nonspecific air-fluid levels mostly over the colon. Remainder of the exam is unchanged. IMPRESSION: Few air-filled minimally dilated small bowel loops in the left mid abdomen with air throughout the colon. Likely due to mild ileus, although early/partial small bowel obstruction is possible. Electronically Signed   By: Heather Larson M.D.   On: 01/11/2018 20:26    ASSESSMENT AND PLAN:   Active Problems:   Diarrhea  Patient is 84 year old presenting with diarrhea significantly elevated WBC count with history of C. Difficile  1.  Diarrhea due to C. difficile colitis  on oral vancomycin  improving.  2.  Hyponatremia due to dehydration we will give IV fluids monitor sodium  change from low sodium to regular diet.  3.  Chronic atrial fibrillation  continue her metoprolol and Xarelto  4.  Hyperlipidemia continue Lipitor  5.  Chronic lower extremity swelling   hold her Bumex for now due to hyponatremia. Swelling better.  6.  Hypertension   Cont blood pressure medications for now   All the records are reviewed and case discussed with Care Management/Social Workerr. Management plans discussed with the patient, family and they are in agreement.  CODE STATUS: DNR  TOTAL TIME TAKING CARE OF THIS PATIENT: 82 minutes.    POSSIBLE D/C IN 1-2 DAYS, DEPENDING ON CLINICAL CONDITION.   Heather Larson M.D on 01/13/2018   Between 7am to 6pm - Pager - [phone]  After 6pm go to [email or web address] - password [email or web address] [facility]  Office  [phone]  CC: Primary care physician; Pleas Koch, NP  Note: This dictation was prepared with Dragon dictation along with smaller phrase technology. Any transcriptional errors that result from this process are unintentional.

## 2018-01-13 NOTE — Progress Notes (Signed)
Patient stated that melatonin tablets do not help her sleep, but agreed to try them. Patient called nurse back and informed that they have not helped and insisted on trying benadryl 50 mg because that helps her at home. Dr. Duane Boston paged and gave verbal order for benadryl 50 mg at bedtime. Patient given ordered medication.  Heather Larson

## 2018-01-14 ENCOUNTER — Telehealth: Payer: Self-pay | Admitting: Gastroenterology

## 2018-01-14 LAB — CBC
HCT: 27.4 % — ABNORMAL LOW (ref 35.0–47.0)
Hemoglobin: 9.6 g/dL — ABNORMAL LOW (ref 12.0–16.0)
MCH: 27.2 pg (ref 26.0–34.0)
MCHC: 34.9 g/dL (ref 32.0–36.0)
MCV: 77.9 fL — ABNORMAL LOW (ref 80.0–100.0)
Platelets: 516 10*3/uL — ABNORMAL HIGH (ref 150–440)
RBC: 3.52 MIL/uL — ABNORMAL LOW (ref 3.80–5.20)
RDW: 16.8 % — AB (ref 11.5–14.5)
WBC: 13.4 10*3/uL — AB (ref 3.6–11.0)

## 2018-01-14 LAB — CREATININE, SERUM
CREATININE: 1.02 mg/dL — AB (ref 0.44–1.00)
GFR, EST AFRICAN AMERICAN: 54 mL/min — AB (ref >60)
GFR, EST NON AFRICAN AMERICAN: 46 mL/min — AB (ref >60)

## 2018-01-14 MED ORDER — VANCOMYCIN HCL 125 MG PO CAPS: 125.0000 mg | ORAL_CAPSULE | Freq: Four times a day (QID) | ORAL | 0 refills | Status: DC

## 2018-01-14 MED ORDER — DORZOLAMIDE HCL 2 % OP SOLN
1.0000 [drp] | Freq: Three times a day (TID) | OPHTHALMIC | Status: DC
  Filled 2018-01-14: qty 10

## 2018-01-14 MED ORDER — RISAQUAD PO CAPS: 2.0000 | ORAL_CAPSULE | Freq: Three times a day (TID) | ORAL | 0 refills | Status: DC

## 2018-01-14 NOTE — Telephone Encounter (Signed)
Pt daughter is calling to inform Dr. Marius Ditch pt is being discharged from Hospital that the rx are working she is scheduled for [facility] 01/29/2018

## 2018-01-14 NOTE — Care Management (Signed)
Patient active with [facility] for RN and PT. Spoke with attending. Requested resumption of care orders. Notified [facility] of discharge.

## 2018-01-14 NOTE — Progress Notes (Signed)
Discharge instructions reviewed with daughter and patient. 6pm dose of vanc given prior to discharge. Rxs given

## 2018-01-14 NOTE — Discharge Summary (Signed)
[facility] NAME: Heather Larson    MR#:  [id number]  DATE OF BIRTH:  [birth year]  DATE OF ADMISSION:  01/11/2018 ADMITTING PHYSICIAN: Dustin Flock, MD  DATE OF DISCHARGE: 01/14/2018   PRIMARY CARE PHYSICIAN: Pleas Koch, NP    ADMISSION DIAGNOSIS:  Diarrhea of presumed infectious origin [R19.7] Hyponatremia [E87.1] Sepsis, due to unspecified organism ([place]) [A41.9]  DISCHARGE DIAGNOSIS:  Active Problems:   Diarrhea   SECONDARY DIAGNOSIS:   Past Medical History:  Diagnosis Date  . A-fib ([place])   . Bilateral lower extremity edema   . Cancer [facility])    breast cancer at age 54 and skin  . Cellulitis    lower abdomen  . Hyperlipidemia   . Hypertension   . Hypertension   . Incontinence   . Low sodium levels   . Macular degeneration     HOSPITAL COURSE:   Patient is 84 year old presenting with diarrhea significantly elevated WBC count with history of C. Difficile  1.Diarrhea due to C. difficile colitis  on oral vancomycin  improving. Her WBCs  Came down from 40- 13 K now.  have some formed stool ( [place]  Instead of watery) and no Urgency. Seen by GI Will give oral vanc and probiotics on discharge.  2.Hyponatremia due to dehydration we will give IV fluids monitor sodium  change from low sodium to regular diet.  She have chronic Hyponatremia.  3.Chronic atrial fibrillation  continue her metoprolol and Xarelto  4.Hyperlipidemia continue Lipitor  5.Chronic lower extremity swelling   hold her Bumex for now due to hyponatremia. Swelling better.  6.Hypertension   Cont blood pressure medications for now  Pt is able to walk with a walker and her daughter is going to stay with her for a few days. Will also get PT and HHA nurse on discharge.  DISCHARGE CONDITIONS:   Stable.  CONSULTS OBTAINED:  Treatment Team:  Lin Landsman, MD Lucilla Lame, MD  DRUG ALLERGIES:  No  Known Allergies  DISCHARGE MEDICATIONS:   Allergies as of 01/14/2018   No Known Allergies     Medication List    STOP taking these medications   docusate sodium 100 MG capsule Commonly known as:  COLACE   valsartan-hydrochlorothiazide 160-25 MG tablet Commonly known as:  DIOVAN-HCT     TAKE these medications   acidophilus Caps capsule Take 2 capsules by mouth 3 (three) times daily.   atorvastatin 10 MG tablet Commonly known as:  LIPITOR Take 1 tablet (10 mg total) by mouth daily.   brimonidine 0.2 % ophthalmic solution Commonly known as:  ALPHAGAN Place 1 drop into both eyes 3 (three) times daily.   bumetanide 1 MG tablet Commonly known as:  BUMEX TAKE 1 TABLET(1 MG) BY MOUTH DAILY AS NEEDED. PATIENT TAKES 1/2 TABLET DAILY AS DIRECTED   calcium carbonate 1500 (600 Ca) MG Tabs tablet Commonly known as:  OSCAL Take by mouth 2 (two) times daily with a meal.   dorzolamide 2 % ophthalmic solution Commonly known as:  TRUSOPT Place 1 drop into both eyes 3 (three) times daily.   feeding supplement (ENSURE ENLIVE) Liqd Take 237 mLs by mouth 2 (two) times daily between meals.   hydrALAZINE 50 MG tablet Commonly known as:  APRESOLINE Take 1 tablet (50 mg total) by mouth 3 (three) times daily.   meloxicam 7.5 MG tablet Commonly known as:  MOBIC Take 7.5 mg by mouth daily.   metoprolol succinate 25  MG 24 hr tablet Commonly known as:  TOPROL-XL Take 3 tablets (75 mg total) by mouth daily.   MULTIVITAMIN ADULT Tabs Take 1 tablet by mouth daily.   Rivaroxaban 15 MG Tabs tablet Commonly known as:  XARELTO Take 1 tablet (15 mg total) by mouth daily.   vancomycin 125 MG capsule Commonly known as:  VANCOCIN Take 1 capsule (125 mg total) by mouth 4 (four) times daily for 9 days.   vitamin C 500 MG tablet Commonly known as:  ASCORBIC ACID Take 500 mg by mouth daily.        DISCHARGE INSTRUCTIONS:    Follow with PMD in 1-2 weeks. Follow with GI clinic in 1  week.  If you experience worsening of your admission symptoms, develop shortness of breath, life threatening emergency, suicidal or homicidal thoughts you must seek medical attention immediately by calling 911 or calling your MD immediately  if symptoms less severe.  You Must read complete instructions/literature along with all the possible adverse reactions/side effects for all the Medicines you take and that have been prescribed to you. Take any new Medicines after you have completely understood and accept all the possible adverse reactions/side effects.   Please note  You were cared for by a hospitalist during your hospital stay. If you have any questions about your discharge medications or the care you received while you were in the hospital after you are discharged, you can call the unit and asked to speak with the hospitalist on call if the hospitalist that took care of you is not available. Once you are discharged, your primary care physician will handle any further medical issues. Please note that NO REFILLS for any discharge medications will be authorized once you are discharged, as it is imperative that you return to your primary care physician (or establish a relationship with a primary care physician if you do not have one) for your aftercare needs so that they can reassess your need for medications and monitor your lab values.    Today   CHIEF COMPLAINT:   Chief Complaint  Patient presents with  . Hyponatremia  . Elevated WBC    HISTORY OF PRESENT ILLNESS:  Heather Larson  is a 84 y.o. female with a known history of C. difficile colitis in the past who is presenting again with complaint of diarrhea.  Patient primary care provider checked her blood work and showed white blood cell count that was very high.  Therefore she sent to the ER.  Patient reports that she has had diarrhea ongoing for the past few days.  She describes it as liquidy and foul-smelling.  She has not had any  fevers or chills.  She does complain of weakness.  VITAL SIGNS:  Blood pressure 126/66, pulse (!) 108, temperature 98.2 F (36.8 C), temperature source Oral, resp. rate 16, height 5\' 2"  (1.575 m), weight 57.6 kg, SpO2 99 %.  I/O:    Intake/Output Summary (Last 24 hours) at 01/14/2018 1647 Last data filed at 01/13/2018 2200 Gross per 24 hour  Intake 454.35 ml  Output -  Net 454.35 ml    PHYSICAL EXAMINATION:   GENERAL:  84 y.o.-year-old patient lying in the bed with no acute distress.  EYES: Pupils equal, round, reactive to light and accommodation. No scleral icterus. Extraocular muscles intact.  HEENT: Head atraumatic, normocephalic. Oropharynx and nasopharynx clear.  NECK:  Supple, no jugular venous distention. No thyroid enlargement, no tenderness.  LUNGS: Normal breath sounds bilaterally, no wheezing, rales,rhonchi or  crepitation. No use of accessory muscles of respiration.  CARDIOVASCULAR: S1, S2 normal. No murmurs, rubs, or gallops.  ABDOMEN: Soft, tender, nondistended. Bowel sounds present. No organomegaly or mass.  EXTREMITIES: No pedal edema, cyanosis, or clubbing.  NEUROLOGIC: Cranial nerves II through XII are intact. Muscle strength 4/5 in all extremities. Sensation intact. Gait not checked.  PSYCHIATRIC: The patient is alert and oriented x 3.  SKIN: No obvious rash, lesion, or ulcer.    DATA REVIEW:   CBC Recent Labs  Lab 01/14/18 0353  WBC 13.4*  HGB 9.6*  HCT 27.4*  PLT 516*    Chemistries  Recent Labs  Lab 01/11/18 2026  01/13/18 0914 01/14/18 0353  NA 119*   < > 126*  --   K 3.0*   < > 3.6  --   CL 86*   < > 92*  --   CO2 24   < > 25  --   GLUCOSE 108*   < > 115*  --   BUN 24*   < > 23  --   CREATININE 0.86   < > 0.72 1.02*  CALCIUM 8.5*   < > 8.3*  --   AST 23  --   --   --   ALT 16  --   --   --   ALKPHOS 90  --   --   --   BILITOT 0.7  --   --   --    < > = values in this interval not displayed.    Cardiac Enzymes No results for  input(s): TROPONINI in the last 168 hours.  Microbiology Results  Results for orders placed or performed during the hospital encounter of 01/11/18  Blood Culture (routine x 2)     Status: None (Preliminary result)   Collection Time: 01/11/18  8:26 PM  Result Value Ref Range Status   Specimen Description BLOOD LEFT HAND  Final   Special Requests   Final    BOTTLES DRAWN AEROBIC AND ANAEROBIC Blood Culture results may not be optimal due to an inadequate volume of blood received in culture bottles   Culture   Final    NO GROWTH 3 DAYS Performed at [facility], [address], [place], [place] [address]    Report Status PENDING  Incomplete  Blood Culture (routine x 2)     Status: None (Preliminary result)   Collection Time: 01/11/18  8:26 PM  Result Value Ref Range Status   Specimen Description BLOOD LEFT ANTECUBITAL  Final   Special Requests   Final    BOTTLES DRAWN AEROBIC AND ANAEROBIC Blood Culture adequate volume   Culture   Final    NO GROWTH 3 DAYS Performed at [facility], [address] [place], [place] [address]    Report Status PENDING  Incomplete  Gastrointestinal Panel by PCR , Stool     Status: None   Collection Time: 01/11/18  8:52 PM  Result Value Ref Range Status   Campylobacter species NOT DETECTED NOT DETECTED Final   Plesimonas shigelloides NOT DETECTED NOT DETECTED Final   Salmonella species NOT DETECTED NOT DETECTED Final   Yersinia enterocolitica NOT DETECTED NOT DETECTED Final   Vibrio species NOT DETECTED NOT DETECTED Final   Vibrio cholerae NOT DETECTED NOT DETECTED Final   Enteroaggregative E coli (EAEC) NOT DETECTED NOT DETECTED Final   Enteropathogenic E coli (EPEC) NOT DETECTED NOT DETECTED Final   Enterotoxigenic E coli (ETEC) NOT DETECTED NOT DETECTED  Final   Shiga like toxin producing E coli (STEC) NOT DETECTED NOT DETECTED Final   Shigella/Enteroinvasive E coli (EIEC) NOT DETECTED NOT DETECTED Final   Cryptosporidium NOT  DETECTED NOT DETECTED Final   Cyclospora cayetanensis NOT DETECTED NOT DETECTED Final   Entamoeba histolytica NOT DETECTED NOT DETECTED Final   Giardia lamblia NOT DETECTED NOT DETECTED Final   Adenovirus F40/41 NOT DETECTED NOT DETECTED Final   Astrovirus NOT DETECTED NOT DETECTED Final   Norovirus GI/GII NOT DETECTED NOT DETECTED Final   Rotavirus A NOT DETECTED NOT DETECTED Final   Sapovirus (I, II, IV, and V) NOT DETECTED NOT DETECTED Final    Comment: Performed at [facility], [address] [place], [place] [address]  C difficile quick scan w PCR reflex     Status: Abnormal   Collection Time: 01/11/18  8:52 PM  Result Value Ref Range Status   C Diff antigen POSITIVE (A) NEGATIVE Final   C Diff toxin NEGATIVE NEGATIVE Final   C Diff interpretation Results are indeterminate. See PCR results.  Final    Comment: Performed at [facility], [address] [place], [place] [address]  C. Diff by PCR, Reflexed     Status: Abnormal   Collection Time: 01/11/18  8:52 PM  Result Value Ref Range Status   Toxigenic C. Difficile by PCR POSITIVE (A) NEGATIVE Final    Comment: Positive for toxigenic C. difficile with little to no toxin production. Only treat if clinical presentation suggests symptomatic illness. Performed at [facility], [address], [place], [place] [address]     RADIOLOGY:  No results found.  EKG:   Orders placed or performed during the hospital encounter of 01/11/18  . EKG 12-Lead  . EKG 12-Lead  . ED EKG 12-Lead  . ED EKG 12-Lead      Management plans discussed with the patient, family and they are in agreement.  CODE STATUS:     Code Status Orders  (From admission, onward)         Start     Ordered   01/11/18 2203  Do not attempt resuscitation (DNR)  Continuous    Question Answer Comment  In the event of cardiac or respiratory ARREST Do not call a "code blue"   In the event of cardiac or respiratory ARREST Do not  perform Intubation, CPR, defibrillation or ACLS   In the event of cardiac or respiratory ARREST Use medication by any route, position, wound care, and other measures to relive pain and suffering. May use oxygen, suction and manual treatment of airway obstruction as needed for comfort.      01/11/18 2202        Code Status History    Date Active Date Inactive Code Status Order ID Comments User Context   11/11/2017 1804 11/27/2017 2149 DNR [id number]  Hillary Bow, MD ED   11/11/2017 1731 11/11/2017 1804 Full Code [id number]  Hillary Bow, MD ED      TOTAL TIME TAKING CARE OF THIS PATIENT: 35 minutes.    [name] M.D on 01/14/2018 at 4:47 PM  Between 7am to 6pm - Pager - [phone]  After 6pm go to [email or web address] - password [email or web address] [facility] [place]  Office  [phone]  CC: Primary care physician; Pleas Koch, NP   Note: This dictation was prepared with Dragon dictation along with smaller phrase technology. Any transcriptional errors that result from this process are unintentional.

## 2018-01-16 LAB — CULTURE, BLOOD (ROUTINE X 2)
Culture: NO GROWTH
Culture: NO GROWTH
Special Requests: ADEQUATE

## 2018-01-19 DIAGNOSIS — I10 Essential (primary) hypertension: Secondary | ICD-10-CM | POA: Diagnosis not present

## 2018-01-19 DIAGNOSIS — A0472 Enterocolitis due to Clostridium difficile, not specified as recurrent: Secondary | ICD-10-CM | POA: Diagnosis not present

## 2018-01-19 DIAGNOSIS — L03311 Cellulitis of abdominal wall: Secondary | ICD-10-CM | POA: Diagnosis not present

## 2018-01-19 DIAGNOSIS — L03115 Cellulitis of right lower limb: Secondary | ICD-10-CM | POA: Diagnosis not present

## 2018-01-19 DIAGNOSIS — L03116 Cellulitis of left lower limb: Secondary | ICD-10-CM | POA: Diagnosis not present

## 2018-01-19 DIAGNOSIS — A409 Streptococcal sepsis, unspecified: Secondary | ICD-10-CM | POA: Diagnosis not present

## 2018-01-21 ENCOUNTER — Telehealth: Payer: Self-pay | Admitting: Licensed Clinical Social Worker

## 2018-01-21 NOTE — Telephone Encounter (Signed)
EMMI flagged patient for answering yes to feeling sad/hopeless/anxious/empty. Clinical Social Worker (CSW) attempted to contact patient via telephone however she did not answer and a voicemail was left.   Nellene Courtois, LCSW [phone]  

## 2018-01-22 NOTE — Telephone Encounter (Signed)
Clinical Education officer, museum (CSW) attempted to contact patient for the second time with no answer. CSW left a voicemail.   [employer], LCSW [phone]

## 2018-01-23 ENCOUNTER — Telehealth: Payer: Self-pay | Admitting: Primary Care

## 2018-01-23 DIAGNOSIS — A0472 Enterocolitis due to Clostridium difficile, not specified as recurrent: Secondary | ICD-10-CM

## 2018-01-23 NOTE — Telephone Encounter (Signed)
Message left for patient to return my call.  

## 2018-01-23 NOTE — Telephone Encounter (Signed)
Patient stated she will need a weeks worth of the  Medication, and have it sent to her preferred pharmacy [facility] on [address].

## 2018-01-23 NOTE — Telephone Encounter (Signed)
Copied from [place] [id number]. Topic: Quick Communication - See Telephone Encounter >> Jan 23, 2018  2:16 PM Margot Ables wrote: CRM for notification. See Telephone encounter for: 01/23/18. Pt calling to see if Allie Bossier, NP will renew ABX for longer. She is on the last day of the ABX vancomycin (VANCOCIN HCL) 125 MG capsule that were prescribed at hospital d/c. She states that she is still having diarrhea. Pt requesting call back on her home # [phone].  [id number] [address], [place] [phone] (Phone) [phone] (Fax)

## 2018-01-23 NOTE — Telephone Encounter (Signed)
Please notify patient that I will provide her enough of her antibiotics to get her to her appointment with GI on October 1st.  Is she out? How many pills does she have?

## 2018-01-24 MED ORDER — VANCOMYCIN HCL 125 MG PO CAPS: 125.0000 mg | ORAL_CAPSULE | Freq: Four times a day (QID) | ORAL | 0 refills | Status: DC

## 2018-01-24 NOTE — Telephone Encounter (Signed)
Noted, refill sent to pharmacy. 

## 2018-01-24 NOTE — Telephone Encounter (Signed)
Spoken and notified patient of Heather Larson comments. Patient took the last pill yesterday

## 2018-01-25 ENCOUNTER — Telehealth: Payer: Self-pay | Admitting: Primary Care

## 2018-01-25 ENCOUNTER — Telehealth: Payer: Self-pay | Admitting: Gastroenterology

## 2018-01-25 DIAGNOSIS — L03311 Cellulitis of abdominal wall: Secondary | ICD-10-CM | POA: Diagnosis not present

## 2018-01-25 DIAGNOSIS — A0472 Enterocolitis due to Clostridium difficile, not specified as recurrent: Secondary | ICD-10-CM | POA: Diagnosis not present

## 2018-01-25 DIAGNOSIS — L03115 Cellulitis of right lower limb: Secondary | ICD-10-CM | POA: Diagnosis not present

## 2018-01-25 DIAGNOSIS — I10 Essential (primary) hypertension: Secondary | ICD-10-CM | POA: Diagnosis not present

## 2018-01-25 DIAGNOSIS — A409 Streptococcal sepsis, unspecified: Secondary | ICD-10-CM | POA: Diagnosis not present

## 2018-01-25 DIAGNOSIS — L03116 Cellulitis of left lower limb: Secondary | ICD-10-CM | POA: Diagnosis not present

## 2018-01-25 NOTE — Telephone Encounter (Signed)
Copied from [place] [id number]. Topic: Quick Communication - See Telephone Encounter >> Jan 25, 2018  1:49 PM Blase Mess A wrote: CRM for notification. See Telephone encounter for: 01/25/18. Patient is requesting a listing of all the patient drs.  The patient can not find her rolex.  Please advise [phone]

## 2018-01-25 NOTE — Telephone Encounter (Signed)
Ask her to take Imodium 2mg  every 4-6hrs as needed for diarrhea   RV

## 2018-01-25 NOTE — Telephone Encounter (Signed)
I spoke with [name] (DPR signed) unable to reach pt;  per chart review tab I see Dr Ida Rogue as cardiologist and Dr Lin Landsman as GI specialist. [name] said she is in [place] and cannot help her mom with this now. [name] said she knows there is a Paediatric nurse and retina specialist and is not sure if others. Request to try to find names of pts specialist and call pt back with info.

## 2018-01-25 NOTE — Telephone Encounter (Signed)
I spoke with pt and gave her the names and phone numbers of the physicians I could find in pts chart; pt took the information and then requested me to type the list and mail to her verified address. Done.

## 2018-01-25 NOTE — Telephone Encounter (Signed)
Pt calling back.  She can't remember the names of her doctors that is why she is calling for [place] to give her all of that information as she has misplaced all of her doctors business cards. Pt can be reached at [phone].

## 2018-01-25 NOTE — Telephone Encounter (Signed)
Pt is calling  To let Dr. Marius Ditch know she has finished antibiotic and is still having Dirrhea and has enough to make it til Tuesday apt, please advise

## 2018-01-25 NOTE — Telephone Encounter (Signed)
Pt is calling  To let Dr. Marius Ditch know she has finished antibiotic and is still having Dirrhea and has enough to make it til Tuesday apt

## 2018-01-28 DIAGNOSIS — Z9181 History of falling: Secondary | ICD-10-CM | POA: Diagnosis not present

## 2018-01-28 DIAGNOSIS — E785 Hyperlipidemia, unspecified: Secondary | ICD-10-CM | POA: Diagnosis not present

## 2018-01-28 DIAGNOSIS — F419 Anxiety disorder, unspecified: Secondary | ICD-10-CM | POA: Diagnosis not present

## 2018-01-28 DIAGNOSIS — Z9011 Acquired absence of right breast and nipple: Secondary | ICD-10-CM | POA: Diagnosis not present

## 2018-01-28 DIAGNOSIS — Z7901 Long term (current) use of anticoagulants: Secondary | ICD-10-CM | POA: Diagnosis not present

## 2018-01-28 DIAGNOSIS — Z85828 Personal history of other malignant neoplasm of skin: Secondary | ICD-10-CM | POA: Diagnosis not present

## 2018-01-28 DIAGNOSIS — E440 Moderate protein-calorie malnutrition: Secondary | ICD-10-CM | POA: Diagnosis not present

## 2018-01-28 DIAGNOSIS — I482 Chronic atrial fibrillation: Secondary | ICD-10-CM | POA: Diagnosis not present

## 2018-01-28 DIAGNOSIS — I10 Essential (primary) hypertension: Secondary | ICD-10-CM | POA: Diagnosis not present

## 2018-01-28 DIAGNOSIS — H3530 Unspecified macular degeneration: Secondary | ICD-10-CM | POA: Diagnosis not present

## 2018-01-28 DIAGNOSIS — Z853 Personal history of malignant neoplasm of breast: Secondary | ICD-10-CM | POA: Diagnosis not present

## 2018-01-29 ENCOUNTER — Ambulatory Visit (INDEPENDENT_AMBULATORY_CARE_PROVIDER_SITE_OTHER): Payer: Medicare Other | Admitting: Gastroenterology

## 2018-01-29 ENCOUNTER — Encounter: Payer: Self-pay | Admitting: Gastroenterology

## 2018-01-29 ENCOUNTER — Other Ambulatory Visit: Payer: Self-pay

## 2018-01-29 VITALS — BP 128/82 | HR 108 | Resp 18 | Ht 62.0 in | Wt 125.6 lb

## 2018-01-29 DIAGNOSIS — A0471 Enterocolitis due to Clostridium difficile, recurrent: Secondary | ICD-10-CM | POA: Diagnosis not present

## 2018-01-29 MED ORDER — VANCOMYCIN HCL 125 MG PO CAPS: 125.0000 mg | ORAL_CAPSULE | Freq: Two times a day (BID) | ORAL | 0 refills | Status: AC

## 2018-01-29 NOTE — Patient Instructions (Addendum)
1.125 mg orally twice daily for 7 days, then 125 mg orally once daily for 7 days, then 125 mg orally every 2 or 3 days for 4 weeks 2. F/u with me in 3 weeks 3. Start fusion plus 1 pill daily for 28months  Please call our office to speak with my nurse Barnie Alderman [phone] during business hours from 8am to 4pm if you have any questions/concerns. During after hours, you will be redirected to on call GI physician. For any emergency please call 911 or go the nearest emergency room.    Cephas Darby, MD [address]  [place]  [place], [place] [address]  Main: [phone]  Fax: [phone]

## 2018-01-29 NOTE — Progress Notes (Signed)
Heather Darby, MD [address]  [place]  [place], [place] [address]  Main: [phone]  Fax: [phone]    Gastroenterology Consultation  Referring Provider:     Pleas Koch, NP Primary Care Physician:  Pleas Koch, NP Primary Gastroenterologist:  Dr. Cephas Larson Reason for Consultation:    Recurrent Clostridium difficile colitis        HPI:   Heather Larson is a 84 y.o. female s seen as a hospital follow-up. Patient was recently discharged from [facility] after being treated for bilateral lower extremity cellulitis, treated with antibiotics and later developed C. Difficile colitis. She was treated with oral vancomycin and IV metronidazole. Discharged home on 2 weeks course of oral vancomycin.   Interval summary: Patient is accompanied by her daughter today who is very involved in her care. Patient's diarrhea has resolved. Currently having one to 2 formed bowel movements daily and sometimes experiences gurgling in her stomach.overall, her energy levels are significantly better, able to socialize with her friends and more active. She started taking iron every other day as recommended by her primary care provider because of microcytic anemia. Patient's hemoglobin was 11.9 in 03/2017 with normal MCV. And her hemoglobin has declined in last 1 month when she was admitted to [facility] with cellulitis and C. Difficile. Her hemoglobin nadir was 8.2, MCV 77 and reactive thrombocytosis. Patient denies having black tarry stools or rectal bleeding. She did have some bloody diarrhea and she has C. Difficile colitis. Her hemoglobin after discharge is 9.3 on 12/05/2017.  Follow-up visit in 01/29/2018 She was readmitted to [facility] about 2 weeks ago secondary to recurrence of C. Difficile.  Her WBC count at that time was 43,000, she was started on oral vancomycin 125 MCG 4 times daily.  During the hospital stay, her WBC count improved to  13,000.  She was discharged home on oral vancomycin, her stools are more formed at the time of discharge.  Her diarrhea has resolved at this time.  She continues to take oral vancomycin 4 times daily which is her week 3.  She reports abdominal bloating.  She is taking acidophilus along with antibiotics.  She is also taking oral iron once a day.  She reports that she feels well otherwise  NSAIDs: none  Antiplts/Anticoagulants/Anti thrombotics: none  GI Procedures: none  Past Medical History:  Diagnosis Date  . A-fib ([place])   . Bilateral lower extremity edema   . Cancer [facility])    breast cancer at age 47 and skin  . Cellulitis    lower abdomen  . Hyperlipidemia   . Hypertension   . Hypertension   . Incontinence   . Low sodium levels   . Macular degeneration     Past Surgical History:  Procedure Laterality Date  . BREAST SURGERY     right mastectomy  . EYE SURGERY     x3  . IR RADIOLOGIST EVAL & MGMT  09/17/2017  . MASTECTOMY     right  . SHOULDER OPEN ROTATOR CUFF REPAIR     left  . SKIN BIOPSY     skin cancer    Current Outpatient Medications:  .  acidophilus (RISAQUAD) CAPS capsule, Take 2 capsules by mouth 3 (three) times daily., Disp: 50 capsule, Rfl: 0 .  atorvastatin (LIPITOR) 10 MG tablet, Take 1 tablet (10 mg total) by mouth daily., Disp: 90 tablet, Rfl: 3 .  brimonidine (ALPHAGAN) 0.2 % ophthalmic solution, Place  1 drop into both eyes 3 (three) times daily., Disp: , Rfl: 6 .  bumetanide (BUMEX) 1 MG tablet, TAKE 1 TABLET(1 MG) BY MOUTH DAILY AS NEEDED. PATIENT TAKES 1/2 TABLET DAILY AS DIRECTED, Disp: 90 tablet, Rfl: 1 .  calcium carbonate (OSCAL) 1500 (600 Ca) MG TABS tablet, Take by mouth 2 (two) times daily with a meal., Disp: , Rfl:  .  dorzolamide (TRUSOPT) 2 % ophthalmic solution, Place 1 drop into both eyes 3 (three) times daily., Disp: , Rfl: 6 .  feeding supplement, ENSURE ENLIVE, (ENSURE ENLIVE) LIQD, Take 237 mLs by mouth 2 (two) times daily between meals.,  Disp: 60 Bottle, Rfl: 0 .  hydrALAZINE (APRESOLINE) 50 MG tablet, Take 1 tablet (50 mg total) by mouth 3 (three) times daily., Disp: 270 tablet, Rfl: 3 .  meloxicam (MOBIC) 7.5 MG tablet, Take 7.5 mg by mouth daily., Disp: , Rfl:  .  metoprolol succinate (TOPROL-XL) 25 MG 24 hr tablet, Take 3 tablets (75 mg total) by mouth daily., Disp: 270 tablet, Rfl: 0 .  Multiple Vitamins-Minerals (MULTIVITAMIN ADULT) TABS, Take 1 tablet by mouth daily., Disp: , Rfl:  .  Rivaroxaban (XARELTO) 15 MG TABS tablet, Take 1 tablet (15 mg total) by mouth daily., Disp: 90 tablet, Rfl: 3 .  TRAVATAN Z 0.004 % SOLN ophthalmic solution, INT 1 GTT IN OU HS, Disp: , Rfl: 5 .  vancomycin (VANCOCIN HCL) 125 MG capsule, Take 1 capsule (125 mg total) by mouth 2 (two) times daily for 14 days., Disp: 28 capsule, Rfl: 0 .  vitamin C (ASCORBIC ACID) 500 MG tablet, Take 500 mg by mouth daily., Disp: , Rfl:   No family history on file.   Social History   Tobacco Use  . Smoking status: Never Smoker  . Smokeless tobacco: Never Used  Substance Use Topics  . Alcohol use: Not Currently    Alcohol/week: 2.0 standard drinks    Types: 2 Shots of liquor per week    Comment: 2 drinks daily  . Drug use: No    Allergies as of 01/29/2018  . (No Known Allergies)    Review of Systems:    All systems reviewed and negative except where noted in HPI.   Physical Exam:  BP 128/82 (BP Location: Left Arm, Patient Position: Sitting, Cuff Size: Normal)   Pulse (!) 108   Resp 18   Ht 5\' 2"  (1.575 m)   Wt 125 lb 9.6 oz (57 kg)   BMI 22.97 kg/m  No LMP recorded. Patient is postmenopausal.  General:   Alert,  Thin built, appropriate for her age, pleasant and cooperative in NAD Head:  Normocephalic and atraumatic. Eyes:  Sclera clear, no icterus.   Conjunctiva pink. Ears:  Normal auditory acuity. Nose:  No deformity, discharge, or lesions. Mouth:  No deformity or lesions,oropharynx pink & moist. Neck:  Supple; no masses or  thyromegaly. Lungs:  Respirations even and unlabored.  Clear throughout to auscultation.   No wheezes, crackles, or rhonchi. No acute distress. Heart:  Regular rate and rhythm; no murmurs, clicks, rubs, or gallops. Abdomen:  Normal bowel sounds. Soft, non-tender and non-distended without masses, hepatosplenomegaly or hernias noted.  No guarding or rebound tenderness.   Rectal: Not performed Msk:  Symmetrical without gross deformities. Good, equal movement & strength bilaterally. Pulses:  Normal pulses noted. Extremities:  No clubbing or edema.  No cyanosis. Neurologic:  Alert and oriented x3;  grossly normal neurologically. Skin:  Intact without significant lesions or rashes. No jaundice.  Lymph Nodes:  No significant cervical adenopathy. Psych:  Alert and cooperative. Normal mood and affect.  Imaging Studies: No abdominal imaging  Assessment and Plan:   Roxene Alviar is a 84 y.o. Caucasian female with history of A. Fib on rivaroxaban, hypertension, admission to [facility] in 10/2017 with lower extremity cellulitis and subsequently developed C. Difficile in the hospital status post treatment with oral vancomycin and metronidazole for 2 weeks. Diarrhea has resolved.  She had first recurrence of C. difficile in 12/2017, currently on oral vancomycin 125 mg 4 times daily, diarrhea resolved, week 3 of antibiotics  First recurrence of C. Difficile colitis: Recommend oral vancomycin taper as below due to recurrence 125 mg orally twice daily for 7 days, then 125 mg orally once daily for 7 days, then 125 mg orally every 2 or 3 days for 4 weeks, refill prescription  Microcytic anemia: Continue oral iron for now  Follow up in 2-3 weeks   Heather Darby, MD

## 2018-01-30 ENCOUNTER — Telehealth: Payer: Self-pay | Admitting: Gastroenterology

## 2018-01-30 DIAGNOSIS — H353122 Nonexudative age-related macular degeneration, left eye, intermediate dry stage: Secondary | ICD-10-CM | POA: Diagnosis not present

## 2018-01-30 DIAGNOSIS — H353213 Exudative age-related macular degeneration, right eye, with inactive scar: Secondary | ICD-10-CM | POA: Diagnosis not present

## 2018-01-30 DIAGNOSIS — H35371 Puckering of macula, right eye: Secondary | ICD-10-CM | POA: Diagnosis not present

## 2018-01-30 DIAGNOSIS — H31091 Other chorioretinal scars, right eye: Secondary | ICD-10-CM | POA: Diagnosis not present

## 2018-01-30 NOTE — Telephone Encounter (Signed)
Returned pt call unable to speak with patient I left a very detailed message concerning medication dosage and days, also instruction on how to take medication

## 2018-01-30 NOTE — Telephone Encounter (Signed)
Pt has been notified to take imodium every 4-6 hrs for diarrhea, LVM

## 2018-01-30 NOTE — Telephone Encounter (Signed)
Pt is calling for questions she has she is on rx antibiotic and she needs to know how many days and  How to take rx  And when to cut back . Please call pt ok to leave msg. [phone]  Be specific on  How to take and cut back on rx

## 2018-01-31 DIAGNOSIS — F419 Anxiety disorder, unspecified: Secondary | ICD-10-CM | POA: Diagnosis not present

## 2018-01-31 DIAGNOSIS — E440 Moderate protein-calorie malnutrition: Secondary | ICD-10-CM | POA: Diagnosis not present

## 2018-01-31 DIAGNOSIS — I10 Essential (primary) hypertension: Secondary | ICD-10-CM | POA: Diagnosis not present

## 2018-01-31 DIAGNOSIS — I482 Chronic atrial fibrillation: Secondary | ICD-10-CM | POA: Diagnosis not present

## 2018-01-31 DIAGNOSIS — E785 Hyperlipidemia, unspecified: Secondary | ICD-10-CM | POA: Diagnosis not present

## 2018-01-31 DIAGNOSIS — H3530 Unspecified macular degeneration: Secondary | ICD-10-CM | POA: Diagnosis not present

## 2018-01-31 NOTE — Telephone Encounter (Signed)
PT left vm  For Dr. Lance Morin nurse please call her she has a question about her rx

## 2018-02-04 DIAGNOSIS — H3530 Unspecified macular degeneration: Secondary | ICD-10-CM | POA: Diagnosis not present

## 2018-02-04 DIAGNOSIS — I482 Chronic atrial fibrillation: Secondary | ICD-10-CM | POA: Diagnosis not present

## 2018-02-04 DIAGNOSIS — E785 Hyperlipidemia, unspecified: Secondary | ICD-10-CM | POA: Diagnosis not present

## 2018-02-04 DIAGNOSIS — E440 Moderate protein-calorie malnutrition: Secondary | ICD-10-CM | POA: Diagnosis not present

## 2018-02-04 DIAGNOSIS — I10 Essential (primary) hypertension: Secondary | ICD-10-CM | POA: Diagnosis not present

## 2018-02-04 DIAGNOSIS — F419 Anxiety disorder, unspecified: Secondary | ICD-10-CM | POA: Diagnosis not present

## 2018-02-05 ENCOUNTER — Other Ambulatory Visit: Payer: Self-pay

## 2018-02-05 ENCOUNTER — Telehealth: Payer: Self-pay | Admitting: Gastroenterology

## 2018-02-05 MED ORDER — FUSION PLUS PO CAPS: 130.0000 mg | ORAL_CAPSULE | Freq: Every day | ORAL | 0 refills | Status: DC

## 2018-02-05 NOTE — Telephone Encounter (Signed)
Fusion plus has been sent to pharmacy, pt has been notified

## 2018-02-05 NOTE — Telephone Encounter (Signed)
Patient's daughter called and Fusion 1 needs to be called into pharmacy. [facility] on [address].

## 2018-02-07 DIAGNOSIS — E785 Hyperlipidemia, unspecified: Secondary | ICD-10-CM | POA: Diagnosis not present

## 2018-02-07 DIAGNOSIS — F419 Anxiety disorder, unspecified: Secondary | ICD-10-CM | POA: Diagnosis not present

## 2018-02-07 DIAGNOSIS — H3530 Unspecified macular degeneration: Secondary | ICD-10-CM | POA: Diagnosis not present

## 2018-02-07 DIAGNOSIS — I10 Essential (primary) hypertension: Secondary | ICD-10-CM | POA: Diagnosis not present

## 2018-02-07 DIAGNOSIS — E440 Moderate protein-calorie malnutrition: Secondary | ICD-10-CM | POA: Diagnosis not present

## 2018-02-07 DIAGNOSIS — I482 Chronic atrial fibrillation: Secondary | ICD-10-CM | POA: Diagnosis not present

## 2018-02-11 ENCOUNTER — Ambulatory Visit: Payer: Medicare Other | Admitting: Gastroenterology

## 2018-02-11 DIAGNOSIS — F419 Anxiety disorder, unspecified: Secondary | ICD-10-CM | POA: Diagnosis not present

## 2018-02-11 DIAGNOSIS — H3530 Unspecified macular degeneration: Secondary | ICD-10-CM | POA: Diagnosis not present

## 2018-02-11 DIAGNOSIS — I482 Chronic atrial fibrillation: Secondary | ICD-10-CM | POA: Diagnosis not present

## 2018-02-11 DIAGNOSIS — E785 Hyperlipidemia, unspecified: Secondary | ICD-10-CM | POA: Diagnosis not present

## 2018-02-11 DIAGNOSIS — E440 Moderate protein-calorie malnutrition: Secondary | ICD-10-CM | POA: Diagnosis not present

## 2018-02-11 DIAGNOSIS — I10 Essential (primary) hypertension: Secondary | ICD-10-CM | POA: Diagnosis not present

## 2018-02-13 DIAGNOSIS — E440 Moderate protein-calorie malnutrition: Secondary | ICD-10-CM | POA: Diagnosis not present

## 2018-02-13 DIAGNOSIS — H3530 Unspecified macular degeneration: Secondary | ICD-10-CM | POA: Diagnosis not present

## 2018-02-13 DIAGNOSIS — F419 Anxiety disorder, unspecified: Secondary | ICD-10-CM | POA: Diagnosis not present

## 2018-02-13 DIAGNOSIS — I482 Chronic atrial fibrillation: Secondary | ICD-10-CM | POA: Diagnosis not present

## 2018-02-13 DIAGNOSIS — I10 Essential (primary) hypertension: Secondary | ICD-10-CM | POA: Diagnosis not present

## 2018-02-13 DIAGNOSIS — E785 Hyperlipidemia, unspecified: Secondary | ICD-10-CM | POA: Diagnosis not present

## 2018-02-14 ENCOUNTER — Ambulatory Visit (INDEPENDENT_AMBULATORY_CARE_PROVIDER_SITE_OTHER): Payer: Medicare Other | Admitting: Primary Care

## 2018-02-14 VITALS — BP 130/70 | HR 110 | Temp 98.0°F | Ht 62.0 in | Wt 130.0 lb

## 2018-02-14 DIAGNOSIS — Z09 Encounter for follow-up examination after completed treatment for conditions other than malignant neoplasm: Secondary | ICD-10-CM

## 2018-02-14 DIAGNOSIS — A0471 Enterocolitis due to Clostridium difficile, recurrent: Secondary | ICD-10-CM

## 2018-02-14 LAB — CBC
HEMATOCRIT: 30.9 % — AB (ref 36.0–46.0)
Hemoglobin: 10.3 g/dL — ABNORMAL LOW (ref 12.0–15.0)
MCHC: 33.2 g/dL (ref 30.0–36.0)
MCV: 79.8 fl (ref 78.0–100.0)
Platelets: 487 10*3/uL — ABNORMAL HIGH (ref 150.0–400.0)
RBC: 3.88 Mil/uL (ref 3.87–5.11)
RDW: 18.2 % — ABNORMAL HIGH (ref 11.5–15.5)
WBC: 10.2 10*3/uL (ref 4.0–10.5)

## 2018-02-14 LAB — BASIC METABOLIC PANEL
BUN: 20 mg/dL (ref 6–23)
CALCIUM: 9.4 mg/dL (ref 8.4–10.5)
CO2: 26 mEq/L (ref 19–32)
Chloride: 94 mEq/L — ABNORMAL LOW (ref 96–112)
Creatinine, Ser: 0.89 mg/dL (ref 0.40–1.20)
GFR: 62.94 mL/min (ref >60.00)
Glucose, Bld: 90 mg/dL (ref 70–99)
Potassium: 4.6 mEq/L (ref 3.5–5.1)
Sodium: 127 mEq/L — ABNORMAL LOW (ref 135–145)

## 2018-02-14 NOTE — Patient Instructions (Addendum)
Stop by the lab prior to leaving today. I will notify you of your results once received.   Continue the vancomycin antibiotic as prescribed. Follow up with Dr. Marius Ditch as scheduled.  Start the fusion plus iron today.   It was a pleasure to see you today!

## 2018-02-14 NOTE — Progress Notes (Signed)
Subjective:    Patient ID: Heather Larson, female    DOB: April 10, 1924, 84 y.o.   MRN: [id number]  HPI  [name] is a 84 year old female who presents today for hospital follow up.  She initially presented to our office on 01/11/18 with complaints of fatigue, diarrhea, intermittent confusion. Testing revealed a WBC count of 41,000 with left shift. She was sent to the emergency department for further treatment.  She was treated with IV fluids and oral vancomycin in the ED and was admitted for further treatment. During her hospital stay her WBC count decreased to 13,000. Her stools were becoming more formed and her weakness improved. She was discharged home on oral vancomycin and probiotics. She was set up for home PT and nursing at discharge.   Since her discharge home she's experiencing watery with some formed stool which is occurring twice daily on average. She has noticed improvement with fatigue and weakness. She is being visited by home health nursing and physical therapy.   She is following with GI with her last visit being 01/29/18. During that visit it was recommended she taper down off of oral vancomycin slowly with 125 mg BID for 7 days, then 125 mg daily for 7 days, then 125 mg every 2-3 days for four weeks. She has a follow up visit scheduled for 02/21/18.  She is compliant to her oral iron and will be started on fusion plus iron that was recommended by GI.   Review of Systems  Constitutional: Negative for fever.       Improved fatigue  Respiratory: Negative for shortness of breath.   Cardiovascular: Negative for chest pain.       Lower extremity edema fluctuating   Gastrointestinal: Negative for abdominal pain.       Watery and formed stools, diarrhea improving  Neurological: Negative for dizziness.       Past Medical History:  Diagnosis Date  . A-fib ([place])   . Bilateral lower extremity edema   . Cancer [facility])    breast cancer at age 69 and skin  . Cellulitis    lower  abdomen  . Diarrhea 01/11/2018  . Hyperlipidemia   . Hypertension   . Hypertension   . Incontinence   . Low sodium levels   . Macular degeneration   . Malnutrition of moderate degree 11/23/2017     Social History   Socioeconomic History  . Marital status: Divorced    Spouse name: Not on file  . Number of children: Not on file  . Years of education: Not on file  . Highest education level: Not on file  Occupational History  . Not on file  Social Needs  . Financial resource strain: Not on file  . Food insecurity:    Worry: Not on file    Inability: Not on file  . Transportation needs:    Medical: Not on file    Non-medical: Not on file  Tobacco Use  . Smoking status: Never Smoker  . Smokeless tobacco: Never Used  Substance and Sexual Activity  . Alcohol use: Not Currently    Alcohol/week: 2.0 standard drinks    Types: 2 Shots of liquor per week    Comment: 2 drinks daily  . Drug use: No  . Sexual activity: Not on file  Lifestyle  . Physical activity:    Days per week: Not on file    Minutes per session: Not on file  . Stress: Not on file  Relationships  .  Social connections:    Talks on phone: Not on file    Gets together: Not on file    Attends religious service: Not on file    Active member of club or organization: Not on file    Attends meetings of clubs or organizations: Not on file    Relationship status: Not on file  . Intimate partner violence:    Fear of current or ex partner: Not on file    Emotionally abused: Not on file    Physically abused: Not on file    Forced sexual activity: Not on file  Other Topics Concern  . Not on file  Social History Narrative   Moved from [place].    Past Surgical History:  Procedure Laterality Date  . BREAST SURGERY     right mastectomy  . EYE SURGERY     x3  . IR RADIOLOGIST EVAL & MGMT  09/17/2017  . MASTECTOMY     right  . SHOULDER OPEN ROTATOR CUFF REPAIR     left  . SKIN BIOPSY     skin cancer    No  family history on file.  No Known Allergies  Current Outpatient Medications on File Prior to Visit  Medication Sig Dispense Refill  . acidophilus (RISAQUAD) CAPS capsule Take 2 capsules by mouth 3 (three) times daily. 50 capsule 0  . atorvastatin (LIPITOR) 10 MG tablet Take 1 tablet (10 mg total) by mouth daily. 90 tablet 3  . brimonidine (ALPHAGAN) 0.2 % ophthalmic solution Place 1 drop into both eyes 3 (three) times daily.  6  . bumetanide (BUMEX) 1 MG tablet TAKE 1 TABLET(1 MG) BY MOUTH DAILY AS NEEDED. PATIENT TAKES 1/2 TABLET DAILY AS DIRECTED 90 tablet 1  . calcium carbonate (OSCAL) 1500 (600 Ca) MG TABS tablet Take by mouth 2 (two) times daily with a meal.    . dorzolamide (TRUSOPT) 2 % ophthalmic solution Place 1 drop into both eyes 3 (three) times daily.  6  . feeding supplement, ENSURE ENLIVE, (ENSURE ENLIVE) LIQD Take 237 mLs by mouth 2 (two) times daily between meals. 60 Bottle 0  . hydrALAZINE (APRESOLINE) 50 MG tablet Take 1 tablet (50 mg total) by mouth 3 (three) times daily. 270 tablet 3  . Iron-FA-B Cmp-C-Biot-Probiotic (FUSION PLUS) CAPS Take 130 mg by mouth daily. 90 capsule 0  . meloxicam (MOBIC) 7.5 MG tablet Take 7.5 mg by mouth daily.    . metoprolol succinate (TOPROL-XL) 25 MG 24 hr tablet Take 3 tablets (75 mg total) by mouth daily. 270 tablet 0  . Multiple Vitamins-Minerals (MULTIVITAMIN ADULT) TABS Take 1 tablet by mouth daily.    . Rivaroxaban (XARELTO) 15 MG TABS tablet Take 1 tablet (15 mg total) by mouth daily. 90 tablet 3  . TRAVATAN Z 0.004 % SOLN ophthalmic solution INT 1 GTT IN OU HS  5  . vitamin C (ASCORBIC ACID) 500 MG tablet Take 500 mg by mouth daily.     No current facility-administered medications on file prior to visit.     BP 130/70   Pulse (!) 110   Temp 98 F (36.7 C) (Oral)   Ht 5\' 2"  (1.575 m)   Wt 130 lb (59 kg)   SpO2 98%   BMI 23.78 kg/m    Objective:   Physical Exam  Constitutional: She is oriented to person, place, and time.  She appears well-nourished.  Neck: Neck supple.  Cardiovascular: Normal rate and regular rhythm.  Respiratory: Effort normal and breath  sounds normal.  GI: Soft. Bowel sounds are normal. There is no tenderness.  Neurological: She is alert and oriented to person, place, and time.  Skin: Skin is warm and dry.           Assessment & Plan:

## 2018-02-14 NOTE — Assessment & Plan Note (Addendum)
Admitted for recurrent c-diff on 01/11/18, improved with oral vancomycin and IV fluids. Discharged home on 01/14/18.  Overall doing better, compliant to oral vancomycin and iron. Exam today stable, appears well.  Continue oral vancomycin. Check CBC and BMP today. Follow up with GI as scheduled.   Hospital notes, labs imaging reviewed.

## 2018-02-18 DIAGNOSIS — I10 Essential (primary) hypertension: Secondary | ICD-10-CM | POA: Diagnosis not present

## 2018-02-18 DIAGNOSIS — I482 Chronic atrial fibrillation: Secondary | ICD-10-CM | POA: Diagnosis not present

## 2018-02-18 DIAGNOSIS — E785 Hyperlipidemia, unspecified: Secondary | ICD-10-CM | POA: Diagnosis not present

## 2018-02-18 DIAGNOSIS — H3530 Unspecified macular degeneration: Secondary | ICD-10-CM | POA: Diagnosis not present

## 2018-02-18 DIAGNOSIS — E440 Moderate protein-calorie malnutrition: Secondary | ICD-10-CM | POA: Diagnosis not present

## 2018-02-18 DIAGNOSIS — F419 Anxiety disorder, unspecified: Secondary | ICD-10-CM | POA: Diagnosis not present

## 2018-02-19 DIAGNOSIS — E440 Moderate protein-calorie malnutrition: Secondary | ICD-10-CM | POA: Diagnosis not present

## 2018-02-19 DIAGNOSIS — H3530 Unspecified macular degeneration: Secondary | ICD-10-CM | POA: Diagnosis not present

## 2018-02-19 DIAGNOSIS — F419 Anxiety disorder, unspecified: Secondary | ICD-10-CM | POA: Diagnosis not present

## 2018-02-19 DIAGNOSIS — E785 Hyperlipidemia, unspecified: Secondary | ICD-10-CM | POA: Diagnosis not present

## 2018-02-19 DIAGNOSIS — I10 Essential (primary) hypertension: Secondary | ICD-10-CM | POA: Diagnosis not present

## 2018-02-19 DIAGNOSIS — I482 Chronic atrial fibrillation: Secondary | ICD-10-CM | POA: Diagnosis not present

## 2018-02-20 ENCOUNTER — Telehealth: Payer: Self-pay | Admitting: Primary Care

## 2018-02-20 DIAGNOSIS — F419 Anxiety disorder, unspecified: Secondary | ICD-10-CM | POA: Diagnosis not present

## 2018-02-20 DIAGNOSIS — E785 Hyperlipidemia, unspecified: Secondary | ICD-10-CM | POA: Diagnosis not present

## 2018-02-20 DIAGNOSIS — I482 Chronic atrial fibrillation: Secondary | ICD-10-CM | POA: Diagnosis not present

## 2018-02-20 DIAGNOSIS — E440 Moderate protein-calorie malnutrition: Secondary | ICD-10-CM | POA: Diagnosis not present

## 2018-02-20 DIAGNOSIS — I10 Essential (primary) hypertension: Secondary | ICD-10-CM | POA: Diagnosis not present

## 2018-02-20 DIAGNOSIS — H3530 Unspecified macular degeneration: Secondary | ICD-10-CM | POA: Diagnosis not present

## 2018-02-20 NOTE — Telephone Encounter (Signed)
Copied from [place] [id number]. Topic: Quick Communication - Home Health Verbal Orders >> Feb 20, 2018  3:29 PM Sheran Luz wrote: Caller/Agency: Nestor Lewandowsky, [facility] Number: [phone]  Requesting: OT eval   Requesting PT:  Frequency: 2xw for 5w

## 2018-02-20 NOTE — Telephone Encounter (Signed)
Approved.  

## 2018-02-21 ENCOUNTER — Ambulatory Visit (INDEPENDENT_AMBULATORY_CARE_PROVIDER_SITE_OTHER): Payer: Medicare Other | Admitting: Gastroenterology

## 2018-02-21 ENCOUNTER — Encounter: Payer: Self-pay | Admitting: Gastroenterology

## 2018-02-21 VITALS — BP 167/99 | HR 108 | Resp 18 | Ht 62.0 in | Wt 143.4 lb

## 2018-02-21 DIAGNOSIS — A0471 Enterocolitis due to Clostridium difficile, recurrent: Secondary | ICD-10-CM

## 2018-02-21 NOTE — Telephone Encounter (Signed)
Message left for Kelly to return my call.  

## 2018-02-21 NOTE — Progress Notes (Signed)
Cephas Darby, MD [address]  [place]  [place], [place] [address]  Main: [phone]  Fax: [phone]    Gastroenterology Consultation  Referring Provider:     Pleas Koch, NP Primary Care Physician:  Pleas Koch, NP Primary Gastroenterologist:  Dr. Cephas Darby Reason for Consultation:    Recurrent Clostridium difficile colitis        HPI:   Heather Larson is a 84 y.o. female s seen as a hospital follow-up. Patient was recently discharged from [facility] after being treated for bilateral lower extremity cellulitis, treated with antibiotics and later developed C. Difficile colitis. She was treated with oral vancomycin and IV metronidazole. Discharged home on 2 weeks course of oral vancomycin.   Interval summary: Patient is accompanied by her daughter today who is very involved in her care. Patient's diarrhea has resolved. Currently having one to 2 formed bowel movements daily and sometimes experiences gurgling in her stomach.overall, her energy levels are significantly better, able to socialize with her friends and more active. She started taking iron every other day as recommended by her primary care provider because of microcytic anemia. Patient's hemoglobin was 11.9 in 03/2017 with normal MCV. And her hemoglobin has declined in last 1 month when she was admitted to [facility] with cellulitis and C. Difficile. Her hemoglobin nadir was 8.2, MCV 77 and reactive thrombocytosis. Patient denies having black tarry stools or rectal bleeding. She did have some bloody diarrhea and she has C. Difficile colitis. Her hemoglobin after discharge is 9.3 on 12/05/2017.  Follow-up visit in 01/29/2018 She was readmitted to [facility] about 2 weeks ago secondary to recurrence of C. Difficile.  Her WBC count at that time was 43,000, she was started on oral vancomycin 125 MCG 4 times daily.  During the hospital stay, her WBC count improved to  13,000.  She was discharged home on oral vancomycin, her stools are more formed at the time of discharge.  Her diarrhea has resolved at this time.  She continues to take oral vancomycin 4 times daily which is her week 3.  She reports abdominal bloating.  She is taking acidophilus along with antibiotics.  She is also taking oral iron once a day.  She reports that she feels well otherwise  Follow-up visit 02/21/2018 Patient denies any complaints of diarrhea.  She has abdominal bloating, sometimes her stools are soft, on average 2 times daily.  Nonbloody, appetite is intact.  She is taking oral iron daily.  Currently, on vancomycin taper 1 pill 2-3 times every week.  Patient is accompanied by her daughter today.  Recent blood work shows that her hemoglobin is improving  NSAIDs: none  Antiplts/Anticoagulants/Anti thrombotics: none  GI Procedures: none  Past Medical History:  Diagnosis Date  . A-fib ([place])   . Bilateral lower extremity edema   . Cancer [facility])    breast cancer at age 45 and skin  . Cellulitis    lower abdomen  . Diarrhea 01/11/2018  . Hyperlipidemia   . Hypertension   . Hypertension   . Incontinence   . Low sodium levels   . Macular degeneration   . Malnutrition of moderate degree 11/23/2017    Past Surgical History:  Procedure Laterality Date  . BREAST SURGERY     right mastectomy  . EYE SURGERY     x3  . IR RADIOLOGIST EVAL & MGMT  09/17/2017  . MASTECTOMY     right  .  SHOULDER OPEN ROTATOR CUFF REPAIR     left  . SKIN BIOPSY     skin cancer    Current Outpatient Medications:  .  acidophilus (RISAQUAD) CAPS capsule, Take 2 capsules by mouth 3 (three) times daily., Disp: 50 capsule, Rfl: 0 .  atorvastatin (LIPITOR) 10 MG tablet, Take 1 tablet (10 mg total) by mouth daily., Disp: 90 tablet, Rfl: 3 .  brimonidine (ALPHAGAN) 0.2 % ophthalmic solution, Place 1 drop into both eyes 3 (three) times daily., Disp: , Rfl: 6 .  bumetanide (BUMEX) 1 MG tablet, TAKE 1  TABLET(1 MG) BY MOUTH DAILY AS NEEDED. PATIENT TAKES 1/2 TABLET DAILY AS DIRECTED, Disp: 90 tablet, Rfl: 1 .  calcium carbonate (OSCAL) 1500 (600 Ca) MG TABS tablet, Take by mouth 2 (two) times daily with a meal., Disp: , Rfl:  .  dorzolamide (TRUSOPT) 2 % ophthalmic solution, Place 1 drop into both eyes 3 (three) times daily., Disp: , Rfl: 6 .  feeding supplement, ENSURE ENLIVE, (ENSURE ENLIVE) LIQD, Take 237 mLs by mouth 2 (two) times daily between meals., Disp: 60 Bottle, Rfl: 0 .  hydrALAZINE (APRESOLINE) 50 MG tablet, Take 1 tablet (50 mg total) by mouth 3 (three) times daily., Disp: 270 tablet, Rfl: 3 .  Iron-FA-B Cmp-C-Biot-Probiotic (FUSION PLUS) CAPS, Take 130 mg by mouth daily., Disp: 90 capsule, Rfl: 0 .  meloxicam (MOBIC) 7.5 MG tablet, Take 7.5 mg by mouth daily., Disp: , Rfl:  .  metoprolol succinate (TOPROL-XL) 25 MG 24 hr tablet, Take 3 tablets (75 mg total) by mouth daily., Disp: 270 tablet, Rfl: 0 .  Multiple Vitamins-Minerals (MULTIVITAMIN ADULT) TABS, Take 1 tablet by mouth daily., Disp: , Rfl:  .  Rivaroxaban (XARELTO) 15 MG TABS tablet, Take 1 tablet (15 mg total) by mouth daily., Disp: 90 tablet, Rfl: 3 .  TRAVATAN Z 0.004 % SOLN ophthalmic solution, INT 1 GTT IN OU HS, Disp: , Rfl: 5 .  vitamin C (ASCORBIC ACID) 500 MG tablet, Take 500 mg by mouth daily., Disp: , Rfl:   No family history on file.   Social History   Tobacco Use  . Smoking status: Never Smoker  . Smokeless tobacco: Never Used  Substance Use Topics  . Alcohol use: Not Currently    Alcohol/week: 2.0 standard drinks    Types: 2 Shots of liquor per week    Comment: 2 drinks daily  . Drug use: No    Allergies as of 02/21/2018  . (No Known Allergies)    Review of Systems:    All systems reviewed and negative except where noted in HPI.   Physical Exam:  BP (!) 167/99 (BP Location: Left Arm, Patient Position: Sitting, Cuff Size: Large)   Pulse (!) 108   Resp 18   Ht 5\' 2"  (1.575 m)   Wt 143 lb  6.4 oz (65 kg)   BMI 26.23 kg/m  No LMP recorded. Patient is postmenopausal.  General:   Alert,  Thin built, appropriate for her age, pleasant and cooperative in NAD Head:  Normocephalic and atraumatic. Eyes:  Sclera clear, no icterus.   Conjunctiva pink. Ears:  Normal auditory acuity. Nose:  No deformity, discharge, or lesions. Mouth:  No deformity or lesions,oropharynx pink & moist. Neck:  Supple; no masses or thyromegaly. Lungs:  Respirations even and unlabored.  Clear throughout to auscultation.   No wheezes, crackles, or rhonchi. No acute distress. Heart:  Regular rate and rhythm; no murmurs, clicks, rubs, or gallops. Abdomen:  Normal bowel  sounds. Soft, non-tender and distended, tympanic without masses, hepatosplenomegaly or hernias noted.  No guarding or rebound tenderness.   Rectal: Not performed Msk:  Symmetrical without gross deformities. Good, equal movement & strength bilaterally. Pulses:  Normal pulses noted. Extremities:  No clubbing or edema.  No cyanosis. Neurologic:  Alert and oriented x3;  grossly normal neurologically. Skin:  Intact without significant lesions or rashes. No jaundice. Lymph Nodes:  No significant cervical adenopathy. Psych:  Alert and cooperative. Normal mood and affect.  Imaging Studies: No abdominal imaging  Assessment and Plan:   Kimie Pidcock is a 84 y.o. Caucasian female with history of A. Fib on rivaroxaban, hypertension, admission to [facility] in 10/2017 with lower extremity cellulitis and subsequently developed C. Difficile in the hospital status post treatment with oral vancomycin and metronidazole for 2 weeks. Diarrhea has resolved.  She had first recurrence of C. difficile in 12/2017, currently on oral vancomycin 125 mg, extended taper   First recurrence of C. Difficile colitis: Continue oral vancomycin taper as directed Discussed with patient about risk of recurrence and notify us if her diarrhea recurs.  We will  evaluate her for fecal transplantation if she has second recurrence of C. Difficile I have suggested her to avoid lactose as she has abdominal bloating  Microcytic anemia: Improving Continue oral iron for now Recheck iron studies during next visit  Follow up in 4 weeks   Cephas Darby, MD

## 2018-02-22 NOTE — Telephone Encounter (Signed)
Gave the approval of the verbal order. 

## 2018-02-25 ENCOUNTER — Telehealth: Payer: Self-pay | Admitting: Primary Care

## 2018-02-25 DIAGNOSIS — E785 Hyperlipidemia, unspecified: Secondary | ICD-10-CM | POA: Diagnosis not present

## 2018-02-25 DIAGNOSIS — I482 Chronic atrial fibrillation: Secondary | ICD-10-CM | POA: Diagnosis not present

## 2018-02-25 DIAGNOSIS — F419 Anxiety disorder, unspecified: Secondary | ICD-10-CM | POA: Diagnosis not present

## 2018-02-25 DIAGNOSIS — I10 Essential (primary) hypertension: Secondary | ICD-10-CM | POA: Diagnosis not present

## 2018-02-25 DIAGNOSIS — H3530 Unspecified macular degeneration: Secondary | ICD-10-CM | POA: Diagnosis not present

## 2018-02-25 DIAGNOSIS — E440 Moderate protein-calorie malnutrition: Secondary | ICD-10-CM | POA: Diagnosis not present

## 2018-02-25 NOTE — Telephone Encounter (Signed)
Copied from [id number]. Topic: Quick Communication - Home Health Verbal Orders >> Feb 25, 2018  4:46 PM Percell Belt A wrote: Caller/Agency: Marlowe Kays with [facility]  Callback Number: [phone] Requesting OT/PT/Skilled Nursing/Social Work: Need [place] for OT  Frequency: 2 week 4

## 2018-02-25 NOTE — Telephone Encounter (Signed)
Approved.  

## 2018-02-26 DIAGNOSIS — E440 Moderate protein-calorie malnutrition: Secondary | ICD-10-CM | POA: Diagnosis not present

## 2018-02-26 DIAGNOSIS — H3530 Unspecified macular degeneration: Secondary | ICD-10-CM | POA: Diagnosis not present

## 2018-02-26 DIAGNOSIS — F419 Anxiety disorder, unspecified: Secondary | ICD-10-CM | POA: Diagnosis not present

## 2018-02-26 DIAGNOSIS — I10 Essential (primary) hypertension: Secondary | ICD-10-CM | POA: Diagnosis not present

## 2018-02-26 DIAGNOSIS — I482 Chronic atrial fibrillation: Secondary | ICD-10-CM | POA: Diagnosis not present

## 2018-02-26 DIAGNOSIS — E785 Hyperlipidemia, unspecified: Secondary | ICD-10-CM | POA: Diagnosis not present

## 2018-02-26 NOTE — Telephone Encounter (Signed)
Left orders on VM for [facility].

## 2018-02-27 DIAGNOSIS — H3530 Unspecified macular degeneration: Secondary | ICD-10-CM | POA: Diagnosis not present

## 2018-02-27 DIAGNOSIS — I10 Essential (primary) hypertension: Secondary | ICD-10-CM | POA: Diagnosis not present

## 2018-02-27 DIAGNOSIS — F419 Anxiety disorder, unspecified: Secondary | ICD-10-CM | POA: Diagnosis not present

## 2018-02-27 DIAGNOSIS — I482 Chronic atrial fibrillation: Secondary | ICD-10-CM | POA: Diagnosis not present

## 2018-02-27 DIAGNOSIS — E440 Moderate protein-calorie malnutrition: Secondary | ICD-10-CM | POA: Diagnosis not present

## 2018-02-27 DIAGNOSIS — E785 Hyperlipidemia, unspecified: Secondary | ICD-10-CM | POA: Diagnosis not present

## 2018-02-28 DIAGNOSIS — I482 Chronic atrial fibrillation: Secondary | ICD-10-CM | POA: Diagnosis not present

## 2018-02-28 DIAGNOSIS — H3530 Unspecified macular degeneration: Secondary | ICD-10-CM | POA: Diagnosis not present

## 2018-02-28 DIAGNOSIS — E785 Hyperlipidemia, unspecified: Secondary | ICD-10-CM | POA: Diagnosis not present

## 2018-02-28 DIAGNOSIS — E440 Moderate protein-calorie malnutrition: Secondary | ICD-10-CM | POA: Diagnosis not present

## 2018-02-28 DIAGNOSIS — F419 Anxiety disorder, unspecified: Secondary | ICD-10-CM | POA: Diagnosis not present

## 2018-02-28 DIAGNOSIS — I10 Essential (primary) hypertension: Secondary | ICD-10-CM | POA: Diagnosis not present

## 2018-03-01 DIAGNOSIS — I10 Essential (primary) hypertension: Secondary | ICD-10-CM | POA: Diagnosis not present

## 2018-03-01 DIAGNOSIS — H3530 Unspecified macular degeneration: Secondary | ICD-10-CM | POA: Diagnosis not present

## 2018-03-01 DIAGNOSIS — I482 Chronic atrial fibrillation: Secondary | ICD-10-CM | POA: Diagnosis not present

## 2018-03-01 DIAGNOSIS — E785 Hyperlipidemia, unspecified: Secondary | ICD-10-CM | POA: Diagnosis not present

## 2018-03-01 DIAGNOSIS — E440 Moderate protein-calorie malnutrition: Secondary | ICD-10-CM | POA: Diagnosis not present

## 2018-03-01 DIAGNOSIS — F419 Anxiety disorder, unspecified: Secondary | ICD-10-CM | POA: Diagnosis not present

## 2018-03-04 ENCOUNTER — Inpatient Hospital Stay
Admission: EM | Admit: 2018-03-04 | Discharge: 2018-03-08 | DRG: 292 | Disposition: A | Discharge status: Home / Self Care | Payer: Medicare Other | Source: Other Acute Inpatient Hospital | Attending: Internal Medicine | Admitting: Internal Medicine

## 2018-03-04 ENCOUNTER — Telehealth: Payer: Self-pay | Admitting: *Deleted

## 2018-03-04 ENCOUNTER — Emergency Department: Payer: Medicare Other

## 2018-03-04 ENCOUNTER — Encounter: Payer: Self-pay | Admitting: Emergency Medicine

## 2018-03-04 ENCOUNTER — Other Ambulatory Visit: Payer: Self-pay

## 2018-03-04 DIAGNOSIS — I4811 Longstanding persistent atrial fibrillation: Secondary | ICD-10-CM

## 2018-03-04 DIAGNOSIS — Z66 Do not resuscitate: Secondary | ICD-10-CM | POA: Diagnosis present

## 2018-03-04 DIAGNOSIS — Z791 Long term (current) use of non-steroidal anti-inflammatories (NSAID): Secondary | ICD-10-CM

## 2018-03-04 DIAGNOSIS — Z79899 Other long term (current) drug therapy: Secondary | ICD-10-CM

## 2018-03-04 DIAGNOSIS — I4821 Permanent atrial fibrillation: Secondary | ICD-10-CM | POA: Diagnosis not present

## 2018-03-04 DIAGNOSIS — I509 Heart failure, unspecified: Secondary | ICD-10-CM

## 2018-03-04 DIAGNOSIS — R0602 Shortness of breath: Secondary | ICD-10-CM | POA: Diagnosis not present

## 2018-03-04 DIAGNOSIS — Z85828 Personal history of other malignant neoplasm of skin: Secondary | ICD-10-CM

## 2018-03-04 DIAGNOSIS — D509 Iron deficiency anemia, unspecified: Secondary | ICD-10-CM | POA: Diagnosis present

## 2018-03-04 DIAGNOSIS — I110 Hypertensive heart disease with heart failure: Secondary | ICD-10-CM | POA: Diagnosis not present

## 2018-03-04 DIAGNOSIS — Z8249 Family history of ischemic heart disease and other diseases of the circulatory system: Secondary | ICD-10-CM

## 2018-03-04 DIAGNOSIS — Z9011 Acquired absence of right breast and nipple: Secondary | ICD-10-CM

## 2018-03-04 DIAGNOSIS — D649 Anemia, unspecified: Secondary | ICD-10-CM

## 2018-03-04 DIAGNOSIS — R0600 Dyspnea, unspecified: Secondary | ICD-10-CM

## 2018-03-04 DIAGNOSIS — R062 Wheezing: Secondary | ICD-10-CM | POA: Diagnosis not present

## 2018-03-04 DIAGNOSIS — M810 Age-related osteoporosis without current pathological fracture: Secondary | ICD-10-CM | POA: Diagnosis present

## 2018-03-04 DIAGNOSIS — R609 Edema, unspecified: Secondary | ICD-10-CM

## 2018-03-04 DIAGNOSIS — Z853 Personal history of malignant neoplasm of breast: Secondary | ICD-10-CM | POA: Diagnosis not present

## 2018-03-04 DIAGNOSIS — I4891 Unspecified atrial fibrillation: Secondary | ICD-10-CM | POA: Diagnosis not present

## 2018-03-04 DIAGNOSIS — N390 Urinary tract infection, site not specified: Secondary | ICD-10-CM | POA: Diagnosis present

## 2018-03-04 DIAGNOSIS — R011 Cardiac murmur, unspecified: Secondary | ICD-10-CM | POA: Diagnosis present

## 2018-03-04 DIAGNOSIS — H3530 Unspecified macular degeneration: Secondary | ICD-10-CM | POA: Diagnosis present

## 2018-03-04 DIAGNOSIS — B961 Klebsiella pneumoniae [K. pneumoniae] as the cause of diseases classified elsewhere: Secondary | ICD-10-CM | POA: Diagnosis present

## 2018-03-04 DIAGNOSIS — Z7901 Long term (current) use of anticoagulants: Secondary | ICD-10-CM

## 2018-03-04 DIAGNOSIS — I482 Chronic atrial fibrillation, unspecified: Secondary | ICD-10-CM

## 2018-03-04 DIAGNOSIS — J90 Pleural effusion, not elsewhere classified: Secondary | ICD-10-CM | POA: Diagnosis not present

## 2018-03-04 DIAGNOSIS — I5033 Acute on chronic diastolic (congestive) heart failure: Secondary | ICD-10-CM | POA: Diagnosis present

## 2018-03-04 DIAGNOSIS — E782 Mixed hyperlipidemia: Secondary | ICD-10-CM | POA: Diagnosis present

## 2018-03-04 HISTORY — DX: Permanent atrial fibrillation: I48.21

## 2018-03-04 LAB — COMPREHENSIVE METABOLIC PANEL
ALT: 18 U/L (ref 0–44)
AST: 24 U/L (ref 15–41)
Albumin: 3.6 g/dL (ref 3.5–5.0)
Alkaline Phosphatase: 62 U/L (ref 38–126)
Anion gap: 5 (ref 5–15)
BILIRUBIN TOTAL: 0.5 mg/dL (ref 0.3–1.2)
BUN: 35 mg/dL — ABNORMAL HIGH (ref 8–23)
CALCIUM: 9 mg/dL (ref 8.9–10.3)
CO2: 25 mmol/L (ref 22–32)
CREATININE: 0.61 mg/dL (ref 0.44–1.00)
Chloride: 97 mmol/L — ABNORMAL LOW (ref 98–111)
GFR calc non Af Amer: >60 mL/min (ref >60)
Glucose, Bld: 117 mg/dL — ABNORMAL HIGH (ref 70–99)
Potassium: 4.4 mmol/L (ref 3.5–5.1)
Sodium: 127 mmol/L — ABNORMAL LOW (ref 135–145)
TOTAL PROTEIN: 6.5 g/dL (ref 6.5–8.1)

## 2018-03-04 LAB — CBC WITH DIFFERENTIAL/PLATELET
Abs Immature Granulocytes: 0.04 10*3/uL (ref 0.00–0.07)
Basophils Absolute: 0.1 10*3/uL (ref 0.0–0.1)
Basophils Relative: 1 %
EOS ABS: 0.3 10*3/uL (ref 0.0–0.5)
EOS PCT: 3 %
HEMATOCRIT: 27.7 % — AB (ref 36.0–46.0)
Hemoglobin: 8.9 g/dL — ABNORMAL LOW (ref 12.0–15.0)
IMMATURE GRANULOCYTES: 0 %
LYMPHS ABS: 2 10*3/uL (ref 0.7–4.0)
Lymphocytes Relative: 18 %
MCH: 26.9 pg (ref 26.0–34.0)
MCHC: 32.1 g/dL (ref 30.0–36.0)
MCV: 83.7 fL (ref 80.0–100.0)
MONO ABS: 1.1 10*3/uL — AB (ref 0.1–1.0)
MONOS PCT: 10 %
NEUTROS PCT: 68 %
Neutro Abs: 7.3 10*3/uL (ref 1.7–7.7)
Platelets: 395 10*3/uL (ref 150–400)
RBC: 3.31 MIL/uL — ABNORMAL LOW (ref 3.87–5.11)
RDW: 18.6 % — AB (ref 11.5–15.5)
WBC: 10.6 10*3/uL — ABNORMAL HIGH (ref 4.0–10.5)
nRBC: 0 % (ref 0.0–0.2)

## 2018-03-04 LAB — TROPONIN I: Troponin I: <0.03 ng/mL (ref <0.03)

## 2018-03-04 NOTE — Telephone Encounter (Signed)
Patient's daughter called stating that she is concerned about her mom. [name] stated that she noticed on Thursday that her mom was having a little SOB and wheezing. [name] stated that patient has been using the little machine that she was given at the hospital to breath into. [name] stated that her mom came over yesterday and she was a little worse. [name] stated that she talked with her mom a little while ago and her mom told her that she feels that she is having to gasp for air at times. [name] stated that her mom is taking Claritin, flonase and she gave her some guaifenesin to take. Patient told [name] that she will take the guaifenesin after she goes to lunch. [name] wants to know what Anda Kraft recommends?

## 2018-03-04 NOTE — Telephone Encounter (Signed)
I recommend she come in for an office visit with either myself or anyone with an available slot. If she's having increased shortness of breath, fevers, swelling in her legs, chest pain then she needs to go to the hospital. Otherwise we should be able to see her in our clinic.

## 2018-03-04 NOTE — ED Notes (Signed)
Pt was seen at Urgent care and they sent her here. They sent over the reading on her CXR. Daughter request not to have another one at this time.

## 2018-03-04 NOTE — ED Triage Notes (Signed)
Pt reports that she developed SOB on the 31st of Oct, and it has gotten worse yesterday and today. Pt is wheezing, daughter reports that she becomes more SOB when she gets up and moves around. She has been taking her Bumex because she began swelling in her extremities.

## 2018-03-04 NOTE — Telephone Encounter (Signed)
FYI

## 2018-03-04 NOTE — ED Provider Notes (Signed)
[facility] Emergency Department Provider Note   ____________________________________________   First MD Initiated Contact with Patient 03/04/18 2245     (approximate)  I have reviewed the triage vital signs and the nursing notes.   HISTORY  Chief Complaint Shortness of Breath    HPI Heather Larson is a 84 y.o. female patient reports developing shortness of breath on 31 October and getting worse yesterday and today and since then.  She is more short of breath with movement.  She is been taking her me Bumex for her swelling in her extremities.  She was seen at the urgent care and sent here.  Did a chest x-ray but of course I cannot access that.   Past Medical History:  Diagnosis Date  . A-fib ([place])   . Bilateral lower extremity edema   . Bilateral lower leg cellulitis 11/11/2017  . Cancer [facility])    breast cancer at age 62 and skin  . Cellulitis    lower abdomen  . Diarrhea 01/11/2018  . Hyperlipidemia   . Hypertension   . Hypertension   . Incontinence   . Low sodium levels   . Macular degeneration   . Malnutrition of moderate degree 11/23/2017    Patient Active Problem List   Diagnosis Date Noted  . Recurrent colitis due to Clostridioides difficile   . Hyponatremia 08/06/2017  . Bilateral lower extremity edema 07/31/2017  . Macular degeneration 06/15/2017  . Chronic atrial fibrillation 06/15/2017  . Essential hypertension 06/15/2017  . Urinary incontinence 06/15/2017  . Osteoporosis 06/15/2017  . Hyperlipemia 06/15/2017  . Glaucoma of both eyes 10/08/2014  . Personal history of other malignant neoplasm of skin 05/13/2014  . Wrist fracture, closed 05/11/2014  . Closed head injury 10/28/2013  . Hand laceration 10/28/2013    Past Surgical History:  Procedure Laterality Date  . BREAST SURGERY     right mastectomy  . EYE SURGERY     x3  . IR RADIOLOGIST EVAL & MGMT  09/17/2017  . MASTECTOMY     right  . SHOULDER OPEN ROTATOR CUFF  REPAIR     left  . SKIN BIOPSY     skin cancer    Prior to Admission medications   Medication Sig Start Date End Date Taking? Authorizing Provider  acidophilus (RISAQUAD) CAPS capsule Take 2 capsules by mouth 3 (three) times daily. 01/14/18   [name], MD  atorvastatin (LIPITOR) 10 MG tablet Take 1 tablet (10 mg total) by mouth daily. 10/02/17   Minna Merritts, MD  brimonidine (ALPHAGAN) 0.2 % ophthalmic solution Place 1 drop into both eyes 3 (three) times daily. 01/02/18   [provider]  bumetanide (BUMEX) 1 MG tablet TAKE 1 TABLET(1 MG) BY MOUTH DAILY AS NEEDED. PATIENT TAKES 1/2 TABLET DAILY AS DIRECTED 11/29/17   Minna Merritts, MD  calcium carbonate (OSCAL) 1500 (600 Ca) MG TABS tablet Take by mouth 2 (two) times daily with a meal.    [provider]  dorzolamide (TRUSOPT) 2 % ophthalmic solution Place 1 drop into both eyes 3 (three) times daily. 01/02/18   [provider]  feeding supplement, ENSURE ENLIVE, (ENSURE ENLIVE) LIQD Take 237 mLs by mouth 2 (two) times daily between meals. 11/27/17   Nicholes Mango, MD  hydrALAZINE (APRESOLINE) 50 MG tablet Take 1 tablet (50 mg total) by mouth 3 (three) times daily. 10/02/17   Minna Merritts, MD  Iron-FA-B Cmp-C-Biot-Probiotic (FUSION PLUS) CAPS Take 130 mg by mouth daily. 02/05/18 05/06/18  Lin Landsman, MD  meloxicam (MOBIC) 7.5 MG tablet Take 7.5 mg by mouth daily.    [provider]  metoprolol succinate (TOPROL-XL) 25 MG 24 hr tablet Take 3 tablets (75 mg total) by mouth daily. 01/11/18   Pleas Koch, NP  Multiple Vitamins-Minerals (MULTIVITAMIN ADULT) TABS Take 1 tablet by mouth daily.    [provider]  Rivaroxaban (XARELTO) 15 MG TABS tablet Take 1 tablet (15 mg total) by mouth daily. 10/02/17   Minna Merritts, MD  TRAVATAN Z 0.004 % SOLN ophthalmic solution INT 1 GTT IN OU HS 01/03/18   [provider]  vitamin C (ASCORBIC ACID) 500 MG tablet Take 500 mg by  mouth daily.    [provider]    Allergies Patient has no known allergies.  History reviewed. No pertinent family history.  Social History Social History   Tobacco Use  . Smoking status: Never Smoker  . Smokeless tobacco: Never Used  Substance Use Topics  . Alcohol use: Not Currently    Alcohol/week: 2.0 standard drinks    Types: 2 Shots of liquor per week    Comment: 2 drinks daily  . Drug use: No    Review of Systems  Constitutional: No fever/chills Eyes: No visual changes. ENT: No sore throat. Cardiovascular: Denies chest pain. Respiratory:shortness of breath. Gastrointestinal: No abdominal pain.  No nausea, no vomiting.  No diarrhea.  No constipation. Genitourinary: Negative for dysuria. Musculoskeletal: Negative for back pain. Skin: Negative for rash. Neurological: Negative for headaches, focal weakness   ____________________________________________   PHYSICAL EXAM:  VITAL SIGNS: ED Triage Vitals  Enc Vitals Group     BP 03/04/18 2010 (!) 159/76     Pulse Rate 03/04/18 2010 (!) 109     Resp 03/04/18 2010 20     Temp 03/04/18 2010 98.2 F (36.8 C)     Temp Source 03/04/18 2010 Oral     SpO2 03/04/18 2010 99 %     Weight 03/04/18 2014 143 lb 6.4 oz (65 kg)     Height 03/04/18 2014 5\' 2"  (1.575 m)     Head Circumference --      Peak Flow --      Pain Score 03/04/18 2014 0     Pain Loc --      Pain Edu? --      Excl. in [place]? --     Constitutional: Alert and oriented. Well appearing and in no acute distress. Eyes: Conjunctivae are normal.. Head: Atraumatic. Nose: No congestion/rhinnorhea. Mouth/Throat: Mucous membranes are moist.  Oropharynx non-erythematous. Neck: No stridor.  Cardiovascular: Normal rate, regular rhythm. Grossly normal heart sounds.  Good peripheral circulation. Respiratory: Normal respiratory effort.  No retractions. Lungs crackles in bases Gastrointestinal: Soft and nontender. No distention. No abdominal bruits. No CVA  tenderness.  Musculoskeletal: No lower extremity tenderness 2+ edema.    Neurologic:  Normal speech and language. No gross focal neurologic deficits are appreciated. No gait instability. Skin:  Skin is warm, dry and intact. No rash noted. Psychiatric: Mood and affect are normal. Speech and behavior are normal.  ____________________________________________   LABS (all labs ordered are listed, but only abnormal results are displayed)  Labs Reviewed  COMPREHENSIVE METABOLIC PANEL  TROPONIN I  BRAIN NATRIURETIC PEPTIDE  CBC WITH DIFFERENTIAL/PLATELET   ____________________________________________  EKG  EKG read and interpreted by me shows A. fib at a rate of 105 normal axis there are flipped T's in the first 3 precordial leads but this  is similar to EKG from September of this year. ____________________________________________  RADIOLOGY  ED MD interpretation: Chest x-ray shows bilateral pleural effusions and increased vascular markings radiology is not had a chance to read the film yet  Official radiology report(s): No results found.  ____________________________________________   PROCEDURES  Procedure(s) performed:   Procedures  Critical Care performed:   ____________________________________________   INITIAL IMPRESSION / ASSESSMENT AND PLAN / ED COURSE   This patient signed out to Dr. Karma Greaser pending labs       ____________________________________________   FINAL CLINICAL IMPRESSION(S) / ED DIAGNOSES  Final diagnoses:  Dyspnea, unspecified type     ED Discharge Orders    None       Note:  This document was prepared using Dragon voice recognition software and may include unintentional dictation errors.    Nena Polio, MD 03/04/18 [phone]

## 2018-03-04 NOTE — ED Notes (Signed)
Helped pt to bathroom. 

## 2018-03-04 NOTE — ED Notes (Signed)
Pt Daughter reports that pt just had blood work done and is on her My Chart and they request not to have blood drawn at this time.

## 2018-03-05 ENCOUNTER — Encounter: Payer: Self-pay | Admitting: Physician Assistant

## 2018-03-05 ENCOUNTER — Observation Stay (HOSPITAL_COMMUNITY)
Admit: 2018-03-05 | Discharge: 2018-03-05 | Disposition: A | Discharge status: Home / Self Care | Payer: Medicare Other | Attending: Physician Assistant | Admitting: Physician Assistant

## 2018-03-05 DIAGNOSIS — I5033 Acute on chronic diastolic (congestive) heart failure: Secondary | ICD-10-CM | POA: Diagnosis not present

## 2018-03-05 DIAGNOSIS — I509 Heart failure, unspecified: Secondary | ICD-10-CM

## 2018-03-05 DIAGNOSIS — E785 Hyperlipidemia, unspecified: Secondary | ICD-10-CM | POA: Diagnosis not present

## 2018-03-05 DIAGNOSIS — I4811 Longstanding persistent atrial fibrillation: Secondary | ICD-10-CM

## 2018-03-05 DIAGNOSIS — I340 Nonrheumatic mitral (valve) insufficiency: Secondary | ICD-10-CM | POA: Diagnosis not present

## 2018-03-05 DIAGNOSIS — I10 Essential (primary) hypertension: Secondary | ICD-10-CM | POA: Diagnosis not present

## 2018-03-05 DIAGNOSIS — I4891 Unspecified atrial fibrillation: Secondary | ICD-10-CM | POA: Diagnosis not present

## 2018-03-05 LAB — TSH: TSH: 2.036 u[IU]/mL (ref 0.350–4.500)

## 2018-03-05 LAB — BRAIN NATRIURETIC PEPTIDE: B Natriuretic Peptide: 903 pg/mL — ABNORMAL HIGH (ref 0.0–100.0)

## 2018-03-05 MED ORDER — BRIMONIDINE TARTRATE 0.2 % OP SOLN
1.0000 [drp] | Freq: Three times a day (TID) | OPHTHALMIC | Status: DC
  Administered 2018-03-05 – 2018-03-08 (×10): 1 [drp] via OPHTHALMIC
  Filled 2018-03-05: qty 5

## 2018-03-05 MED ORDER — FUROSEMIDE 10 MG/ML IJ SOLN
60.0000 mg | Freq: Two times a day (BID) | INTRAMUSCULAR | Status: DC
  Administered 2018-03-05: 60 mg via INTRAVENOUS
  Filled 2018-03-05: qty 6

## 2018-03-05 MED ORDER — BUMETANIDE 0.5 MG PO TABS
0.5000 mg | ORAL_TABLET | Freq: Every day | ORAL | Status: DC
  Filled 2018-03-05: qty 1

## 2018-03-05 MED ORDER — ATORVASTATIN CALCIUM 20 MG PO TABS
10.0000 mg | ORAL_TABLET | Freq: Every day | ORAL | Status: DC
  Administered 2018-03-05 – 2018-03-07 (×3): 10 mg via ORAL
  Filled 2018-03-05 (×3): qty 1

## 2018-03-05 MED ORDER — RISAQUAD PO CAPS
2.0000 | ORAL_CAPSULE | Freq: Three times a day (TID) | ORAL | Status: DC
  Administered 2018-03-05 – 2018-03-08 (×9): 2 via ORAL
  Filled 2018-03-05 (×9): qty 2

## 2018-03-05 MED ORDER — LATANOPROST 0.005 % OP SOLN
1.0000 [drp] | Freq: Every day | OPHTHALMIC | Status: DC
  Administered 2018-03-05 – 2018-03-07 (×3): 1 [drp] via OPHTHALMIC
  Filled 2018-03-05: qty 2.5

## 2018-03-05 MED ORDER — VITAMIN C 500 MG PO TABS
500.0000 mg | ORAL_TABLET | Freq: Every day | ORAL | Status: DC
  Administered 2018-03-05 – 2018-03-08 (×4): 500 mg via ORAL
  Filled 2018-03-05 (×4): qty 1

## 2018-03-05 MED ORDER — FUSION PLUS PO CAPS: 130.0000 mg | ORAL_CAPSULE | Freq: Every day | ORAL | Status: DC

## 2018-03-05 MED ORDER — ENSURE ENLIVE PO LIQD
237.0000 mL | Freq: Two times a day (BID) | ORAL | Status: DC
  Administered 2018-03-05 – 2018-03-08 (×7): 237 mL via ORAL

## 2018-03-05 MED ORDER — DOCUSATE SODIUM 100 MG PO CAPS
100.0000 mg | ORAL_CAPSULE | Freq: Two times a day (BID) | ORAL | Status: DC
  Administered 2018-03-05 – 2018-03-08 (×6): 100 mg via ORAL
  Filled 2018-03-05 (×7): qty 1

## 2018-03-05 MED ORDER — ACETAMINOPHEN 325 MG PO TABS: 650.0000 mg | ORAL_TABLET | Freq: Four times a day (QID) | ORAL | Status: DC | PRN

## 2018-03-05 MED ORDER — HYDRALAZINE HCL 50 MG PO TABS
50.0000 mg | ORAL_TABLET | Freq: Three times a day (TID) | ORAL | Status: DC
  Administered 2018-03-05 – 2018-03-06 (×2): 50 mg via ORAL
  Filled 2018-03-05 (×4): qty 1

## 2018-03-05 MED ORDER — ADULT MULTIVITAMIN W/MINERALS CH
1.0000 | ORAL_TABLET | Freq: Every day | ORAL | Status: DC
  Administered 2018-03-05 – 2018-03-08 (×4): 1 via ORAL
  Filled 2018-03-05 (×4): qty 1

## 2018-03-05 MED ORDER — FUROSEMIDE 10 MG/ML IJ SOLN: 80.0000 mg | Freq: Two times a day (BID) | INTRAMUSCULAR | Status: DC

## 2018-03-05 MED ORDER — FUROSEMIDE 10 MG/ML IJ SOLN: 20.0000 mg | Freq: Once | INTRAMUSCULAR | Status: DC

## 2018-03-05 MED ORDER — CALCIUM CARBONATE ANTACID 500 MG PO CHEW
400.0000 mg | CHEWABLE_TABLET | Freq: Two times a day (BID) | ORAL | Status: DC
  Administered 2018-03-05 – 2018-03-08 (×8): 400 mg via ORAL
  Filled 2018-03-05 (×7): qty 2

## 2018-03-05 MED ORDER — ONDANSETRON HCL 4 MG PO TABS: 4.0000 mg | ORAL_TABLET | Freq: Four times a day (QID) | ORAL | Status: DC | PRN

## 2018-03-05 MED ORDER — FUROSEMIDE 10 MG/ML IJ SOLN: 60.0000 mg | Freq: Two times a day (BID) | INTRAMUSCULAR | Status: DC

## 2018-03-05 MED ORDER — FUROSEMIDE 10 MG/ML IJ SOLN
40.0000 mg | Freq: Once | INTRAMUSCULAR | Status: DC
  Filled 2018-03-05: qty 4

## 2018-03-05 MED ORDER — RIVAROXABAN 15 MG PO TABS
15.0000 mg | ORAL_TABLET | Freq: Every day | ORAL | Status: DC
  Administered 2018-03-06 – 2018-03-07 (×2): 15 mg via ORAL
  Filled 2018-03-05 (×3): qty 1

## 2018-03-05 MED ORDER — FUROSEMIDE 10 MG/ML IJ SOLN
40.0000 mg | Freq: Once | INTRAMUSCULAR | Status: AC
  Administered 2018-03-05: 40 mg via INTRAVENOUS
  Filled 2018-03-05: qty 4

## 2018-03-05 MED ORDER — DORZOLAMIDE HCL 2 % OP SOLN
1.0000 [drp] | Freq: Three times a day (TID) | OPHTHALMIC | Status: DC
  Administered 2018-03-05 – 2018-03-08 (×10): 1 [drp] via OPHTHALMIC
  Filled 2018-03-05: qty 10

## 2018-03-05 MED ORDER — BUMETANIDE 0.5 MG PO TABS: 0.5000 mg | ORAL_TABLET | Freq: Every day | ORAL | Status: DC

## 2018-03-05 MED ORDER — ACETAMINOPHEN 650 MG RE SUPP: 650.0000 mg | Freq: Four times a day (QID) | RECTAL | Status: DC | PRN

## 2018-03-05 MED ORDER — METOPROLOL SUCCINATE ER 50 MG PO TB24: 75.0000 mg | ORAL_TABLET | Freq: Every day | ORAL | Status: DC

## 2018-03-05 MED ORDER — FUROSEMIDE 10 MG/ML IJ SOLN
60.0000 mg | Freq: Two times a day (BID) | INTRAMUSCULAR | Status: DC
  Administered 2018-03-05: 60 mg via INTRAVENOUS

## 2018-03-05 MED ORDER — CALCIUM CARBONATE 1500 (600 CA) MG PO TABS: 1500.0000 mg | ORAL_TABLET | Freq: Two times a day (BID) | ORAL | Status: DC

## 2018-03-05 MED ORDER — ONDANSETRON HCL 4 MG/2ML IJ SOLN: 4.0000 mg | Freq: Four times a day (QID) | INTRAMUSCULAR | Status: DC | PRN

## 2018-03-05 NOTE — H&P (Signed)
Heather Larson is an 84 y.o. female.   Chief Complaint: Shortness of breath HPI: The patient with past medical history of atrial fibrillation and hypertension presents to the emergency department complaining of shortness of breath.  The patient has become more dyspneic over the last few days and has noticed that her lower extremities have been swelling despite taking her Bumex as directed.  She denies chest pain.  Chest x-ray in the emergency department showed mild interstitial edema.  She was given Lasix IV prior to the emergency department staff called the hospitalist service for admission.  Past Medical History:  Diagnosis Date  . A-fib ([place])   . Bilateral lower extremity edema   . Bilateral lower leg cellulitis 11/11/2017  . Cancer [facility])    breast cancer at age 14 and skin  . Cellulitis    lower abdomen  . Diarrhea 01/11/2018  . Hyperlipidemia   . Hypertension   . Hypertension   . Incontinence   . Low sodium levels   . Macular degeneration   . Malnutrition of moderate degree 11/23/2017    Past Surgical History:  Procedure Laterality Date  . BREAST SURGERY     right mastectomy  . EYE SURGERY     x3  . IR RADIOLOGIST EVAL & MGMT  09/17/2017  . MASTECTOMY     right  . SHOULDER OPEN ROTATOR CUFF REPAIR     left  . SKIN BIOPSY     skin cancer    History reviewed. No pertinent family history. Social History:  reports that she has never smoked. She has never used smokeless tobacco. She reports that she drank about 2.0 standard drinks of alcohol per week. She reports that she does not use drugs.  Allergies: No Known Allergies  Medications Prior to Admission  Medication Sig Dispense Refill  . acidophilus (RISAQUAD) CAPS capsule Take 2 capsules by mouth 3 (three) times daily. 50 capsule 0  . albuterol (PROVENTIL HFA;VENTOLIN HFA) 108 (90 Base) MCG/ACT inhaler Inhale 2 puffs into the lungs every 6 (six) hours as needed for wheezing or shortness of breath.    Marland Kitchen atorvastatin  (LIPITOR) 10 MG tablet Take 1 tablet (10 mg total) by mouth daily. 90 tablet 3  . brimonidine (ALPHAGAN) 0.2 % ophthalmic solution Place 1 drop into both eyes 3 (three) times daily.  6  . bumetanide (BUMEX) 1 MG tablet TAKE 1 TABLET(1 MG) BY MOUTH DAILY AS NEEDED. PATIENT TAKES 1/2 TABLET DAILY AS DIRECTED (Patient taking differently: Take 0.5 mg by mouth daily. ) 90 tablet 1  . calcium carbonate (OSCAL) 1500 (600 Ca) MG TABS tablet Take by mouth 2 (two) times daily with a meal.    . dorzolamide (TRUSOPT) 2 % ophthalmic solution Place 1 drop into both eyes 3 (three) times daily.  6  . hydrALAZINE (APRESOLINE) 50 MG tablet Take 1 tablet (50 mg total) by mouth 3 (three) times daily. 270 tablet 3  . Iron-FA-B Cmp-C-Biot-Probiotic (FUSION PLUS) CAPS Take 130 mg by mouth daily. 90 capsule 0  . meloxicam (MOBIC) 7.5 MG tablet Take 7.5 mg by mouth daily.    . metoprolol succinate (TOPROL-XL) 25 MG 24 hr tablet Take 3 tablets (75 mg total) by mouth daily. 270 tablet 0  . Multiple Vitamins-Minerals (MULTIVITAMIN ADULT) TABS Take 1 tablet by mouth daily.    . Rivaroxaban (XARELTO) 15 MG TABS tablet Take 1 tablet (15 mg total) by mouth daily. 90 tablet 3  . TRAVATAN Z 0.004 % SOLN ophthalmic solution Place  1 drop into both eyes at bedtime.   5  . vitamin C (ASCORBIC ACID) 500 MG tablet Take 500 mg by mouth daily.    . feeding supplement, ENSURE ENLIVE, (ENSURE ENLIVE) LIQD Take 237 mLs by mouth 2 (two) times daily between meals. 60 Bottle 0    Results for orders placed or performed during the hospital encounter of 03/04/18 (from the past 48 hour(s))  Comprehensive metabolic panel     Status: Abnormal   Collection Time: 03/04/18 11:12 PM  Result Value Ref Range   Sodium 127 (L) 135 - 145 mmol/L   Potassium 4.4 3.5 - 5.1 mmol/L   Chloride 97 (L) 98 - 111 mmol/L   CO2 25 22 - 32 mmol/L   Glucose, Bld 117 (H) 70 - 99 mg/dL   BUN 35 (H) 8 - 23 mg/dL   Creatinine, Ser 0.61 0.44 - 1.00 mg/dL   Calcium 9.0  8.9 - 10.3 mg/dL   Total Protein 6.5 6.5 - 8.1 g/dL   Albumin 3.6 3.5 - 5.0 g/dL   AST 24 15 - 41 U/L   ALT 18 0 - 44 U/L   Alkaline Phosphatase 62 38 - 126 U/L   Total Bilirubin 0.5 0.3 - 1.2 mg/dL   GFR calc non Af Amer >60 >60 mL/min   GFR calc Af Amer >60 >60 mL/min    Comment: (NOTE) The eGFR has been calculated using the CKD EPI equation. This calculation has not been validated in all clinical situations. eGFR's persistently <60 mL/min signify possible Chronic Kidney Disease.    Anion gap 5 5 - 15    Comment: Performed at [facility], [address] [place], [place] [address]  Troponin I     Status: None   Collection Time: 03/04/18 11:12 PM  Result Value Ref Range   Troponin I <0.03 <0.03 ng/mL    Comment: Performed at [facility], [address] [place], [place] [address]  Brain natriuretic peptide     Status: Abnormal   Collection Time: 03/04/18 11:12 PM  Result Value Ref Range   B Natriuretic Peptide 903.0 (H) 0.0 - 100.0 pg/mL    Comment: Performed at [facility], [address] [place], [place] [address]  CBC with Differential     Status: Abnormal   Collection Time: 03/04/18 11:12 PM  Result Value Ref Range   WBC 10.6 (H) 4.0 - 10.5 K/uL   RBC 3.31 (L) 3.87 - 5.11 MIL/uL   Hemoglobin 8.9 (L) 12.0 - 15.0 g/dL   HCT 27.7 (L) 36.0 - 46.0 %   MCV 83.7 80.0 - 100.0 fL   MCH 26.9 26.0 - 34.0 pg   MCHC 32.1 30.0 - 36.0 g/dL   RDW 18.6 (H) 11.5 - 15.5 %   Platelets 395 150 - 400 K/uL   nRBC 0.0 0.0 - 0.2 %   Neutrophils Relative % 68 %   Neutro Abs 7.3 1.7 - 7.7 K/uL   Lymphocytes Relative 18 %   Lymphs Abs 2.0 0.7 - 4.0 K/uL   Monocytes Relative 10 %   Monocytes Absolute 1.1 (H) 0.1 - 1.0 K/uL   Eosinophils Relative 3 %   Eosinophils Absolute 0.3 0.0 - 0.5 K/uL   Basophils Relative 1 %   Basophils Absolute 0.1 0.0 - 0.1 K/uL   Immature Granulocytes 0 %   Abs Immature Granulocytes 0.04 0.00 - 0.07 K/uL    Comment: Performed at  [facility] [place], [address] [place],  [place] [address]  TSH     Status: None   Collection Time: 03/04/18 11:12 PM  Result Value Ref Range   TSH 2.036 0.350 - 4.500 uIU/mL    Comment: Performed by a 3rd Generation assay with a functional sensitivity of <=0.01 uIU/mL. Performed at [facility], [address] [place], [place] [address]    Dg Chest Portable 1 View  Result Date: 03/04/2018 CLINICAL DATA:  Shortness of breath EXAM: PORTABLE CHEST 1 VIEW COMPARISON:  01/11/2018 FINDINGS: There is mild cardiomegaly. Small left pleural effusion. Mild interstitial pulmonary edema. No focal consolidation. IMPRESSION: Mild cardiomegaly with small left pleural effusion and mild interstitial pulmonary edema. Electronically Signed   By: Ulyses Jarred M.D.   On: 03/04/2018 23:27    Review of Systems  Constitutional: Negative for chills and fever.  HENT: Negative for sore throat and tinnitus.   Eyes: Negative for blurred vision and redness.  Respiratory: Positive for shortness of breath. Negative for cough.   Cardiovascular: Positive for leg swelling. Negative for chest pain, palpitations, orthopnea and PND.  Gastrointestinal: Negative for abdominal pain, diarrhea, nausea and vomiting.  Genitourinary: Negative for dysuria, frequency and urgency.  Musculoskeletal: Negative for joint pain and myalgias.  Skin: Negative for rash.       No lesions  Neurological: Negative for speech change, focal weakness and weakness.  Endo/Heme/Allergies: Does not bruise/bleed easily.       No temperature intolerance  Psychiatric/Behavioral: Negative for depression and suicidal ideas.    Blood pressure (!) 141/80, pulse 76, temperature (!) 97.5 F (36.4 C), temperature source Oral, resp. rate (!) 22, height '5\' 2"'  (1.575 m), weight 65 kg, SpO2 98 %. Physical Exam  Vitals reviewed. Constitutional: She is oriented to person, place, and time. She appears well-developed and well-nourished. No  distress.  HENT:  Head: Normocephalic and atraumatic.  Mouth/Throat: Oropharynx is clear and moist.  Eyes: Pupils are equal, round, and reactive to light. Conjunctivae and EOM are normal. No scleral icterus.  Neck: Normal range of motion. Neck supple. No JVD present. No tracheal deviation present. No thyromegaly present.  Cardiovascular: Normal rate, regular rhythm and normal heart sounds. Exam reveals no gallop and no friction rub.  No murmur heard. Respiratory: Effort normal and breath sounds normal.  GI: Soft. Bowel sounds are normal. She exhibits no distension. There is no tenderness.  Genitourinary:  Genitourinary Comments: Deferred  Musculoskeletal: Normal range of motion. She exhibits edema.  Lymphadenopathy:    She has no cervical adenopathy.  Neurological: She is alert and oriented to person, place, and time. No cranial nerve deficit. She exhibits normal muscle tone.  Skin: Skin is warm and dry. No rash noted. No erythema.  Psychiatric: She has a normal mood and affect. Her behavior is normal. Judgment and thought content normal.     Assessment/Plan This is a 84 year old female admitted for CHF exacerbation. 1.  CHF exacerbation: The patient does not carry a diagnosis of CHF though she has been on Bumex for years.  Her last echo from April 2019 shows preserved ejection fraction.  She must have some diastolic dysfunction.  I have increased her dose of Bumex for today and she may resume her home dose tomorrow.  Continue metoprolol. 2.  Hypertension: Acceptable for age; continue hydralazine 3.  Atrial fibrillation: Rate controlled; continue Xarelto 4.  Hyperlipidemia: Continue statin therapy 5.  DVT prophylaxis: There pubic anti-coagulation 6.  GI prophylaxis: None The patient is a DNR.  Time spent on admission orders and  patient care approximately 45 minutes     Harrie Foreman, MD 03/05/2018, 7:52 AM

## 2018-03-05 NOTE — ED Notes (Signed)
Placed external cath on pt  

## 2018-03-05 NOTE — Care Management Note (Signed)
Case Management Note  Patient Details  Name: Heather Larson MRN: [id number] Date of Birth: 08/30/1923  Subjective/Objective: Admitted to [facility] under observation status with the diagnosis of congestive heartfailure. Lives alone at [facility] since last March. Last seen Dr, Carlis Abbott 02/14/18. Daughter is [name] [phone]).  [facility] sends a nurse 2 x week. Gets physical therapy per [facility], Self feeds, self dressing, gets help with shower 2 x week. Daughter helps with errands, Uses rolling walker in the home.  Last fall was 5 years ago. Good appetite. Family will transport.                 Action/Plan: Will continue to follow for discharge plans. Will need resumption of orders for services   Expected Discharge Date:                  Expected Discharge Plan:     In-House Referral:   yes  Discharge planning Services   yes  Post Acute Care Choice:    Choice offered to:     DME Arranged:    DME Agency:     HH Arranged:    HH Agency:     Status of Service:     If discussed at [employer] of Stay Meetings, dates discussed:    Additional Comments:  [name], RN MSN CCM Care Management [phone] 03/05/2018, 10:05 AM

## 2018-03-05 NOTE — ED Provider Notes (Signed)
-----------------------------------------   12:00 AM on 03/05/2018 -----------------------------------------   Assuming care from Dr. Cinda Quest.  In short, Heather Larson is a 84 y.o. female with a chief complaint of SOB, wheezing, dyspnea on exertion, increased LE swelling.  Refer to the original H&P for additional details.  The current plan of care is to follow up labs, CXR, and determine disposition.   ----------------------------------------- 12:23 AM on 03/05/2018 -----------------------------------------  The patient has interstitial edema on chest x-ray as well as a small pleural effusion and cardiomegaly.  She has a BNP of over 900.  Interestingly she has no prior documented history of congestive heart failure and had an echocardiogram 6 to 7 months ago that showed normal heart function.  Given her increased work of breathing, new diagnosis of what appears to be congestive heart failure, age and comorbidities, etc., I discussed it with the family and her daughter is very concerned about her and her increased work of breathing at home although she is breathing relatively comfortably at this time.  I am ordering a dose of furosemide 40 mg IV to begin diuresis and will discuss the case with the hospitalist for admission, presumably for repeat echocardiogram, diuresis, and cardiology consultation.   Hinda Kehr, MD 03/05/18 [phone]

## 2018-03-05 NOTE — Care Management Obs Status (Signed)
[place] NOTIFICATION   Patient Details  Name: Heather Larson MRN: [id number] Date of Birth: [birth year]   Medicare Observation Status Notification Given:  Yes:  Explained to patient    [name], RN 03/05/2018, 9:25 AM

## 2018-03-05 NOTE — Telephone Encounter (Signed)
Noted. Patient is currently in the hospital.

## 2018-03-05 NOTE — Consult Note (Signed)
Cardiology Consultation:   Patient ID: Heather Larson MRN: [id number]; DOB: 05-26-1923  Admit date: 03/04/2018 Date of Consult: 03/05/2018  Primary Care Provider: Pleas Koch, NP Primary Cardiologist: Dr. Rockey Situ Primary Electrophysiologist:  None    Patient Profile:   Heather Larson is a 84 y.o. female with a hx of permanent Atrial fibrillation on Xarelto, HTN, chronic LEE, and HLD though in 2018 stopped statin d/t LDL within range who is being seen today for the evaluation of CHF exacerbation at the request of Dr. Marcille Blanco.  History of Present Illness:   Heather Larson is  A 84 yo female with PMH as above.   Per 10/2016 cardiology documentation by Dr. Rosiland Oz, Afib diagnosed 01/2013 and on Xarelto. Echo from 2014 was referenced in these notes and showed normal left ventricular size and systolic function with estimated EF 60-65%, biatrial enlargement, aortic sclerosis, trivial AR, mild MR, TR. Plan at this visit (10/2016) was to continue medical management and RTC in 12 months. Reported medications included hydralazine 25mg  TID, valsartan-HCTZ 25mg  daily, diltiazem 360mg  daily, Xarelto 15mg  daily, bumetanide 1mg  daily to BID as needed, and atorvastatin 10mg  daily. Per physician, statin therapy stopped d/t low LDL. Plan also documented for 12h fasting blood work in 3 months.   07/2017, she saw Dr. Rockey Situ of [facility] after referral from Alma Friendly for consultation of LEE, erythema, and Afib. At that time, the patient was documented as having stopped Bumex as of several months prior as she was bothered by frequent urination and incontinence. However, before reportedly seeing Dr. Rockey Situ, she took several doses of Bumex at the urging of her family and Alma Friendly. She remained on diltiazem. Lab work showed total cholesterol 141, LDL 56, Na 126, Cr 0.78, BUN 23, and K 4.5. EKG showed Afib with ventricular rate of 60bpm and no significant ST/T wave changes. Repeat echo showed normal EF 55-60%. Echo  copied in CV studies below. Recommendations were to stop diltiazem and start metoprolol succinate 50mg  daily as well as increase hydralazine up to 50mg  TID and monitor HR/BP at home.   Over the last few days (since 02/27/2018-11/5), the patient has reportedly experienced increasing DOE as well as LEE despite taking Bumex as directed. She reportedly felt DOE (SOB only with activity), that occurred as putting on her socks and shoes. She reported her LEE was chronic but was increased from baseline. No associated CP. She was reportedly sent here via urgent care, who did a CXR and suggested she present to [facility]   03/05/2018 and in the ED Vitals: BP 159/76, HR 109, RR 20, T 98.2, SpO2 99% ORA Labs: BNP  903 EKG: Atrial fibrillation with ventricular rate 105, TWI in precordial leads consistent with 12/2017 EKG CXR: B/l pleural effusions and increased vascular markings, interstitial edema, cardiomegaly Meds: Furosemide 40mg  IV administered in the ED and echo ordered with admission as well as cardiology consulted for further management. She was admitted for CHF exacerbation with bumex increased x1day.   On interview today, patient reports no family history of heart disease other than "a sister that started to get CHF at the age of 32." She also now reports fatigue, which she attributes to being kept awake while waiting all night in the ED. She is somnolent on exam.  Past Medical History:  Diagnosis Date  . A-fib ([place])   . Bilateral lower extremity edema   . Bilateral lower leg cellulitis 11/11/2017  . Cancer [facility])    breast cancer at age 32 and skin  .  Cellulitis    lower abdomen  . Diarrhea 01/11/2018  . Hyperlipidemia   . Hypertension   . Hypertension   . Incontinence   . Low sodium levels   . Macular degeneration   . Malnutrition of moderate degree 11/23/2017    Past Surgical History:  Procedure Laterality Date  . BREAST SURGERY     right mastectomy  . EYE SURGERY     x3  . IR RADIOLOGIST  EVAL & MGMT  09/17/2017  . MASTECTOMY     right  . SHOULDER OPEN ROTATOR CUFF REPAIR     left  . SKIN BIOPSY     skin cancer     Home Medications:  Prior to Admission medications   Medication Sig Start Date End Date Taking? Authorizing Provider  acidophilus (RISAQUAD) CAPS capsule Take 2 capsules by mouth 3 (three) times daily. 01/14/18  Yes [name], MD  albuterol (PROVENTIL HFA;VENTOLIN HFA) 108 (90 Base) MCG/ACT inhaler Inhale 2 puffs into the lungs every 6 (six) hours as needed for wheezing or shortness of breath.   Yes [provider]  atorvastatin (LIPITOR) 10 MG tablet Take 1 tablet (10 mg total) by mouth daily. 10/02/17  Yes Gollan, Kathlene November, MD  brimonidine (ALPHAGAN) 0.2 % ophthalmic solution Place 1 drop into both eyes 3 (three) times daily. 01/02/18  Yes [provider]  bumetanide (BUMEX) 1 MG tablet TAKE 1 TABLET(1 MG) BY MOUTH DAILY AS NEEDED. PATIENT TAKES 1/2 TABLET DAILY AS DIRECTED Patient taking differently: Take 0.5 mg by mouth daily.  11/29/17  Yes Gollan, Kathlene November, MD  calcium carbonate (OSCAL) 1500 (600 Ca) MG TABS tablet Take by mouth 2 (two) times daily with a meal.   Yes [provider]  dorzolamide (TRUSOPT) 2 % ophthalmic solution Place 1 drop into both eyes 3 (three) times daily. 01/02/18  Yes [provider]  hydrALAZINE (APRESOLINE) 50 MG tablet Take 1 tablet (50 mg total) by mouth 3 (three) times daily. 10/02/17  Yes Gollan, Kathlene November, MD  Iron-FA-B Cmp-C-Biot-Probiotic (FUSION PLUS) CAPS Take 130 mg by mouth daily. 02/05/18 05/06/18 Yes Vanga, Tally Due, MD  meloxicam (MOBIC) 7.5 MG tablet Take 7.5 mg by mouth daily.   Yes [provider]  metoprolol succinate (TOPROL-XL) 25 MG 24 hr tablet Take 3 tablets (75 mg total) by mouth daily. 01/11/18  Yes Pleas Koch, NP  Multiple Vitamins-Minerals (MULTIVITAMIN ADULT) TABS Take 1 tablet by mouth daily.   Yes [provider]  Rivaroxaban (XARELTO) 15  MG TABS tablet Take 1 tablet (15 mg total) by mouth daily. 10/02/17  Yes Gollan, Kathlene November, MD  TRAVATAN Z 0.004 % SOLN ophthalmic solution Place 1 drop into both eyes at bedtime.  01/03/18  Yes [provider]  vitamin C (ASCORBIC ACID) 500 MG tablet Take 500 mg by mouth daily.   Yes [provider]  feeding supplement, ENSURE ENLIVE, (ENSURE ENLIVE) LIQD Take 237 mLs by mouth 2 (two) times daily between meals. 11/27/17   Nicholes Mango, MD    Inpatient Medications: Scheduled Meds: . acidophilus  2 capsule Oral TID  . atorvastatin  10 mg Oral Daily  . brimonidine  1 drop Both Eyes TID  . calcium carbonate  400 mg of elemental calcium Oral BID WC  . docusate sodium  100 mg Oral BID  . dorzolamide  1 drop Both Eyes TID  . feeding supplement (ENSURE ENLIVE)  237 mL Oral BID BM  . furosemide  60 mg Intravenous  BID  . hydrALAZINE  50 mg Oral TID  . latanoprost  1 drop Both Eyes QHS  . metoprolol succinate  75 mg Oral Daily  . multivitamin with minerals  1 tablet Oral Daily  . [START ON 03/06/2018] Rivaroxaban  15 mg Oral Q supper  . vitamin C  500 mg Oral Daily   Continuous Infusions:  PRN Meds: acetaminophen **OR** acetaminophen, ondansetron **OR** ondansetron (ZOFRAN) IV  Allergies:   No Known Allergies  Social History:   Social History   Socioeconomic History  . Marital status: Divorced    Spouse name: Not on file  . Number of children: Not on file  . Years of education: Not on file  . Highest education level: Not on file  Occupational History  . Not on file  Social Needs  . Financial resource strain: Not on file  . Food insecurity:    Worry: Not on file    Inability: Not on file  . Transportation needs:    Medical: Not on file    Non-medical: Not on file  Tobacco Use  . Smoking status: Never Smoker  . Smokeless tobacco: Never Used  Substance and Sexual Activity  . Alcohol use: Not Currently    Alcohol/week: 2.0 standard drinks    Types: 2 Shots of  liquor per week    Comment: 2 drinks daily  . Drug use: No  . Sexual activity: Not on file  Lifestyle  . Physical activity:    Days per week: Not on file    Minutes per session: Not on file  . Stress: Not on file  Relationships  . Social connections:    Talks on phone: Not on file    Gets together: Not on file    Attends religious service: Not on file    Active member of club or organization: Not on file    Attends meetings of clubs or organizations: Not on file    Relationship status: Not on file  . Intimate partner violence:    Fear of current or ex partner: Not on file    Emotionally abused: Not on file    Physically abused: Not on file    Forced sexual activity: Not on file  Other Topics Concern  . Not on file  Social History Narrative   Moved from [place].    Family History:    No reported family history of heart disease other than a sister that started to get CHF around age 47.  ROS:  Please see the history of present illness.   All other ROS reviewed and negative.     Physical Exam/Data:   Vitals:   03/05/18 0400 03/05/18 0419 03/05/18 0522 03/05/18 0854  BP:  (!) 156/78 (!) 141/80 (!) 123/57  Pulse: 99  76 80  Resp: 14 15 (!) 22 18  Temp:   (!) 97.5 F (36.4 C) 97.9 F (36.6 C)  TempSrc:   Oral Oral  SpO2: 98%  98% 96%  Weight:      Height:       No intake or output data in the 24 hours ending 03/05/18 0900 Filed Weights   03/04/18 2014  Weight: 65 kg   Body mass index is 26.23 kg/m.  General:  Somnolent on exam and keeps eyes closed for most of the interview. Looks uncomfortable at times. No current SOB. Frail, elderly woman.  HEENT: normal Neck: JVP ~8-10cm Vascular: No carotid bruits Cardiac:  Afib, S1, S2; RRR; no murmur  Lungs:  Bibasilar crackles  Abd: somewhat distended, not tender to palpation Ext: 3+ pitting edema, erythema  Musculoskeletal:  No deformities Skin: warm and dry  Neuro:  no focal abnormalities noted Psych:  Normal  affect - somnolent  EKG:  The EKG was personally reviewed and demonstrates:  As in HPI Telemetry:  Telemetry was personally reviewed and demonstrates:  Afib with ventricular rates 70-80s  Relevant CV Studies:  08/16/2017 TTE Left ventricle: The cavity size was normal. Systolic function was   normal. The estimated ejection fraction was in the range of 55%   to 60%. Wall motion was normal; there were no regional wall   motion abnormalities. The study is not technically sufficient to   allow evaluation of LV diastolic function. - Aortic valve: There was mild regurgitation. Mean gradient (S): 6   mm Hg. - Mitral valve: There was mild to moderate regurgitation. - Left atrium: The atrium was moderately dilated. - Right ventricle: Systolic function was normal. - Pulmonary arteries: Systolic pressure was within the normal   range.  Laboratory Data:  Chemistry Recent Labs  Lab 03/04/18 2312  NA 127*  K 4.4  CL 97*  CO2 25  GLUCOSE 117*  BUN 35*  CREATININE 0.61  CALCIUM 9.0  GFRNONAA >60  GFRAA >60  ANIONGAP 5    Recent Labs  Lab 03/04/18 2312  PROT 6.5  ALBUMIN 3.6  AST 24  ALT 18  ALKPHOS 62  BILITOT 0.5   Hematology Recent Labs  Lab 03/04/18 2312  WBC 10.6*  RBC 3.31*  HGB 8.9*  HCT 27.7*  MCV 83.7  MCH 26.9  MCHC 32.1  RDW 18.6*  PLT 395   Cardiac Enzymes Recent Labs  Lab 03/04/18 2312  TROPONINI <0.03   No results for input(s): TROPIPOC in the last 168 hours.  BNP Recent Labs  Lab 03/04/18 2312  BNP 903.0*    DDimer No results for input(s): DDIMER in the last 168 hours.  Radiology/Studies:  Dg Chest Portable 1 View  Result Date: 03/04/2018 CLINICAL DATA:  Shortness of breath EXAM: PORTABLE CHEST 1 VIEW COMPARISON:  01/11/2018 FINDINGS: There is mild cardiomegaly. Small left pleural effusion. Mild interstitial pulmonary edema. No focal consolidation. IMPRESSION: Mild cardiomegaly with small left pleural effusion and mild interstitial  pulmonary edema. Electronically Signed   By: Ulyses Jarred M.D.   On: 03/04/2018 23:27    Assessment and Plan:   1. Suspected CHF - SOB, b/l LEE. Per Dr. Rockey Situ 07/2017 documentation, patient was taken off of her diltiazem as it was thought this might be the culprit of any LEE. Echo as above. Patient was started on Toprol 50mg  daily for rate control and hydralazine dosage increased. She remained on bumex, ARB  - BNP 903 - Cr 0.89  0.61. K 4.6. - On 60mg  lasix injection BID, hydralazine.  - Increased to 80mg  BID injection x2 with repeat BMET in AM and plan to reassess dosage with respect to kidney function and electrolytes at that time. No output as of yet recorded but patient does report urination since receiving the lasix. I spoke with a nurse regarding the patient's catheter, as the patient reported that the catheter is not working and she has thus been urinating in the bed.  - Previous 07/2017 echo did not suggest CHF but likely difficult to assess as patient was in Afib with RVR. Rate is 80bpm today. Can attempt to reassess echo. No output recorded to date in chart as above. - Close monitoring of I/O,  daily weights. Fluid restriction for now - Further recommendations pending echo  2. Permanent Afib with RVR - PTA: switched to Toprol XL as above d/t concern for LEE side effects on CCB.   - Xarelto for anticoagulation. Continue and titrate as needed for rate control - Plan for continued rate control with Toprol 75mg .  3. HLD - Restarted lipitor 10mg  Continue - LDL 56 as of 07/2017  4. HTN - Controlled as of this note / documentation - Continue to monitor.    For questions or updates, please contact [place] Please consult [email or web address] for contact info under     Signed, Arvil Chaco, PA-C  03/05/2018 9:00 AM

## 2018-03-06 DIAGNOSIS — B961 Klebsiella pneumoniae [K. pneumoniae] as the cause of diseases classified elsewhere: Secondary | ICD-10-CM | POA: Diagnosis present

## 2018-03-06 DIAGNOSIS — Z791 Long term (current) use of non-steroidal anti-inflammatories (NSAID): Secondary | ICD-10-CM | POA: Diagnosis not present

## 2018-03-06 DIAGNOSIS — I4821 Permanent atrial fibrillation: Secondary | ICD-10-CM | POA: Diagnosis present

## 2018-03-06 DIAGNOSIS — E782 Mixed hyperlipidemia: Secondary | ICD-10-CM | POA: Diagnosis present

## 2018-03-06 DIAGNOSIS — M810 Age-related osteoporosis without current pathological fracture: Secondary | ICD-10-CM | POA: Diagnosis present

## 2018-03-06 DIAGNOSIS — R0600 Dyspnea, unspecified: Secondary | ICD-10-CM | POA: Diagnosis not present

## 2018-03-06 DIAGNOSIS — J90 Pleural effusion, not elsewhere classified: Secondary | ICD-10-CM

## 2018-03-06 DIAGNOSIS — I10 Essential (primary) hypertension: Secondary | ICD-10-CM | POA: Diagnosis not present

## 2018-03-06 DIAGNOSIS — I110 Hypertensive heart disease with heart failure: Secondary | ICD-10-CM | POA: Diagnosis present

## 2018-03-06 DIAGNOSIS — Z85828 Personal history of other malignant neoplasm of skin: Secondary | ICD-10-CM | POA: Diagnosis not present

## 2018-03-06 DIAGNOSIS — Z79899 Other long term (current) drug therapy: Secondary | ICD-10-CM | POA: Diagnosis not present

## 2018-03-06 DIAGNOSIS — R609 Edema, unspecified: Secondary | ICD-10-CM | POA: Diagnosis not present

## 2018-03-06 DIAGNOSIS — H3530 Unspecified macular degeneration: Secondary | ICD-10-CM | POA: Diagnosis present

## 2018-03-06 DIAGNOSIS — D509 Iron deficiency anemia, unspecified: Secondary | ICD-10-CM | POA: Diagnosis present

## 2018-03-06 DIAGNOSIS — E785 Hyperlipidemia, unspecified: Secondary | ICD-10-CM | POA: Diagnosis not present

## 2018-03-06 DIAGNOSIS — Z853 Personal history of malignant neoplasm of breast: Secondary | ICD-10-CM | POA: Diagnosis not present

## 2018-03-06 DIAGNOSIS — I4891 Unspecified atrial fibrillation: Secondary | ICD-10-CM | POA: Diagnosis not present

## 2018-03-06 DIAGNOSIS — I509 Heart failure, unspecified: Secondary | ICD-10-CM | POA: Diagnosis not present

## 2018-03-06 DIAGNOSIS — Z7901 Long term (current) use of anticoagulants: Secondary | ICD-10-CM | POA: Diagnosis not present

## 2018-03-06 DIAGNOSIS — I5033 Acute on chronic diastolic (congestive) heart failure: Secondary | ICD-10-CM | POA: Diagnosis present

## 2018-03-06 DIAGNOSIS — I482 Chronic atrial fibrillation, unspecified: Secondary | ICD-10-CM

## 2018-03-06 DIAGNOSIS — R011 Cardiac murmur, unspecified: Secondary | ICD-10-CM | POA: Diagnosis present

## 2018-03-06 DIAGNOSIS — D649 Anemia, unspecified: Secondary | ICD-10-CM | POA: Diagnosis not present

## 2018-03-06 DIAGNOSIS — Z8249 Family history of ischemic heart disease and other diseases of the circulatory system: Secondary | ICD-10-CM | POA: Diagnosis not present

## 2018-03-06 DIAGNOSIS — N390 Urinary tract infection, site not specified: Secondary | ICD-10-CM | POA: Diagnosis present

## 2018-03-06 DIAGNOSIS — Z66 Do not resuscitate: Secondary | ICD-10-CM | POA: Diagnosis present

## 2018-03-06 DIAGNOSIS — Z9011 Acquired absence of right breast and nipple: Secondary | ICD-10-CM | POA: Diagnosis not present

## 2018-03-06 LAB — CBC WITH DIFFERENTIAL/PLATELET
Abs Immature Granulocytes: 0.03 10*3/uL (ref 0.00–0.07)
BASOS ABS: 0 10*3/uL (ref 0.0–0.1)
BASOS PCT: 0 %
EOS ABS: 0.3 10*3/uL (ref 0.0–0.5)
EOS PCT: 4 %
HCT: 24.3 % — ABNORMAL LOW (ref 36.0–46.0)
HEMOGLOBIN: 7.9 g/dL — AB (ref 12.0–15.0)
Immature Granulocytes: 0 %
LYMPHS PCT: 21 %
Lymphs Abs: 1.8 10*3/uL (ref 0.7–4.0)
MCH: 27 pg (ref 26.0–34.0)
MCHC: 32.5 g/dL (ref 30.0–36.0)
MCV: 82.9 fL (ref 80.0–100.0)
MONO ABS: 1 10*3/uL (ref 0.1–1.0)
Monocytes Relative: 12 %
Neutro Abs: 5.1 10*3/uL (ref 1.7–7.7)
Neutrophils Relative %: 63 %
PLATELETS: 351 10*3/uL (ref 150–400)
RBC: 2.93 MIL/uL — AB (ref 3.87–5.11)
RDW: 19 % — AB (ref 11.5–15.5)
WBC: 8.3 10*3/uL (ref 4.0–10.5)
nRBC: 0 % (ref 0.0–0.2)

## 2018-03-06 LAB — IRON AND TIBC
IRON: 16 ug/dL — AB (ref 28–170)
Saturation Ratios: 5 % — ABNORMAL LOW (ref 10.4–31.8)
TIBC: 338 ug/dL (ref 250–450)
UIBC: 322 ug/dL

## 2018-03-06 LAB — BASIC METABOLIC PANEL
Anion gap: 5 (ref 5–15)
BUN: 37 mg/dL — AB (ref 8–23)
CHLORIDE: 97 mmol/L — AB (ref 98–111)
CO2: 27 mmol/L (ref 22–32)
CREATININE: 0.91 mg/dL (ref 0.44–1.00)
Calcium: 8.5 mg/dL — ABNORMAL LOW (ref 8.9–10.3)
GFR, EST NON AFRICAN AMERICAN: 53 mL/min — AB (ref >60)
Glucose, Bld: 103 mg/dL — ABNORMAL HIGH (ref 70–99)
Potassium: 4.1 mmol/L (ref 3.5–5.1)
SODIUM: 129 mmol/L — AB (ref 135–145)

## 2018-03-06 LAB — VITAMIN B12: VITAMIN B 12: 457 pg/mL (ref 180–914)

## 2018-03-06 LAB — FERRITIN: FERRITIN: 21 ng/mL (ref 11–307)

## 2018-03-06 LAB — ECHOCARDIOGRAM COMPLETE
Height: 62 in
Weight: 2294.41 oz

## 2018-03-06 LAB — RETICULOCYTES
IMMATURE RETIC FRACT: 12.6 % (ref 2.3–15.9)
RBC.: 3.02 MIL/uL — AB (ref 3.87–5.11)
RETIC COUNT ABSOLUTE: 98.8 10*3/uL (ref 19.0–186.0)
Retic Ct Pct: 3.3 % — ABNORMAL HIGH (ref 0.4–3.1)

## 2018-03-06 LAB — FOLATE: Folate: 33 ng/mL (ref >5.9)

## 2018-03-06 MED ORDER — FUROSEMIDE 10 MG/ML IJ SOLN
40.0000 mg | Freq: Two times a day (BID) | INTRAMUSCULAR | Status: DC
  Administered 2018-03-06 – 2018-03-08 (×4): 40 mg via INTRAVENOUS
  Filled 2018-03-06 (×4): qty 4

## 2018-03-06 MED ORDER — METOPROLOL SUCCINATE ER 50 MG PO TB24: 100.0000 mg | ORAL_TABLET | Freq: Every day | ORAL | Status: DC

## 2018-03-06 MED ORDER — METOPROLOL SUCCINATE ER 50 MG PO TB24
75.0000 mg | ORAL_TABLET | Freq: Every day | ORAL | Status: DC
  Administered 2018-03-06: 75 mg via ORAL
  Filled 2018-03-06: qty 2

## 2018-03-06 MED ORDER — DILTIAZEM HCL 30 MG PO TABS
45.0000 mg | ORAL_TABLET | Freq: Four times a day (QID) | ORAL | Status: DC
  Administered 2018-03-06 – 2018-03-07 (×2): 45 mg via ORAL
  Filled 2018-03-06 (×2): qty 2

## 2018-03-06 NOTE — Evaluation (Signed)
Physical Therapy Evaluation Patient Details Name: Heather Larson MRN: [id number] DOB: 04-23-1924 Today's Date: 03/06/2018   History of Present Illness  84 yo Female came to ED with shortness of breath over last few days. She was diagnosed with CHF exacerbation. Patient also reports increased lower extremity edema; PMH significant: A-fib, HTN, HLD, History of Breast CA (age 46), macular degeneration; patient was living at [facility] independent living prior to admittance;   Clinical Impression  8 pleasant female came to ED with shortness of breath. She was living at [facility] prior to admittance and was getting PT 2x a week for gait, balance and strength. Patient denies any recent falls. She reports that she has been trying to stay active in order to live longer. Patient is mod I for bed mobility and transfers. She ambulated 175 feet with RW, mod I exhibiting reciprocal gait pattern and good safety awareness. Her HR did increase to low 150's with ambulation indicating lower endurance and/or it could have been high due to A-fib. Patient's SPo2 was 98% on room air with ambulation indicating good O2 levels. She exhibits functional LE strength grossly 4/5; She also exhibits fair standing balance. Patient would benefit from skilled PT Intervention to improve balance and gait tolerance for return to PLOF. Recommend home health PT upon discharge to continue addressing fitness goals.     Follow Up Recommendations Home health PT    Equipment Recommendations  None recommended by PT    Recommendations for Other Services       Precautions / Restrictions Precautions Precautions: Fall Restrictions Weight Bearing Restrictions: No      Mobility  Bed Mobility Overal bed mobility: Modified Independent             General bed mobility comments: able to transition supine to sit without bed rail;   Transfers Overall transfer level: Modified independent Equipment used: Rolling walker (2  wheeled)             General transfer comment: exhibits good safety awareness with sit<>Stand using RW x2 reps   Ambulation/Gait Ambulation/Gait assistance: Modified independent (Device/Increase time) Gait Distance (Feet): 175 Feet Assistive device: Rolling walker (2 wheeled) Gait Pattern/deviations: Step-through pattern;Trunk flexed;Narrow base of support Gait velocity: decreased   General Gait Details: patient did get mildly short of breath during ambulation, however SPO2 98% on room air following ambulation; does ambulate with slightly slower gait speed;   Stairs            Wheelchair Mobility    Modified Rankin (Stroke Patients Only)       Balance Overall balance assessment: Modified Independent                                           Pertinent Vitals/Pain Pain Assessment: No/denies pain    Home Living Family/patient expects to be discharged to:: Other (Comment)                 Additional Comments: was at [facility] independent living;     Prior Function Level of Independence: Independent with assistive device(s)         Comments: did need assistance with bathing 2x a week; mod I for dressing and eating; used rollator for ambulation;      Hand Dominance   Dominant Hand: Right    Extremity/Trunk Assessment   Upper Extremity Assessment Upper Extremity Assessment: Generalized weakness  Lower Extremity Assessment Lower Extremity Assessment: Overall WFL for tasks assessed    Cervical / Trunk Assessment Cervical / Trunk Assessment: Kyphotic  Communication   Communication: No difficulties  Cognition Arousal/Alertness: Awake/alert Behavior During Therapy: WFL for tasks assessed/performed Overall Cognitive Status: Within Functional Limits for tasks assessed                                        General Comments General comments (skin integrity, edema, etc.): does have swelling in BLE; redness  noted on sacrum    Exercises     Assessment/Plan    PT Assessment Patient needs continued PT services  PT Problem List Decreased mobility;Decreased activity tolerance;Cardiopulmonary status limiting activity       PT Treatment Interventions Therapeutic activities;Gait training;Therapeutic exercise;Patient/family education;Functional mobility training;Neuromuscular re-education    PT Goals (Current goals can be found in the Care Plan section)  Acute Rehab PT Goals Patient Stated Goal: to go back to [facility] PT Goal Formulation: With patient Time For Goal Achievement: 03/20/18    Frequency Min 2X/week   Barriers to discharge   none    Co-evaluation               AM-PAC PT "6 Clicks" Daily Activity  Outcome Measure Difficulty turning over in bed (including adjusting bedclothes, sheets and blankets)?: None Difficulty moving from lying on back to sitting on the side of the bed? : A Little Difficulty sitting down on and standing up from a chair with arms (e.g., wheelchair, bedside commode, etc,.)?: A Little Help needed moving to and from a bed to chair (including a wheelchair)?: None Help needed walking in hospital room?: None Help needed climbing 3-5 steps with a railing? : A Little 6 Click Score: 21    End of Session Equipment Utilized During Treatment: Gait belt Activity Tolerance: Patient tolerated treatment well;No increased pain Patient left: in chair;with call bell/phone within reach Nurse Communication: Mobility status;Other (comment)(notified Stanton Kidney (RN) that patient was in chair without alarm pad, as there were no alarm pads available on the unit. ) PT Visit Diagnosis: Difficulty in walking, not elsewhere classified (R26.2)    Time: 4818-5631 PT Time Calculation (min) (ACUTE ONLY): 28 min   Charges:   PT Evaluation $PT Eval Low Complexity: 1 Low            Stephana Morell PT, DPT 03/06/2018, 12:09 PM

## 2018-03-06 NOTE — Progress Notes (Signed)
Progress Note  Patient Name: Heather Larson Date of Encounter: 03/06/2018  Primary Cardiologist: Rockey Situ, tim  Subjective   Confused as to why she has " congestive heart failure" Still with leg swelling, shortness of breath Telemetry reviewed showing atrial fibrillation with RVR on exertion out of bed, heart rate up to 150s  Inpatient Medications    Scheduled Meds: . acidophilus  2 capsule Oral TID  . atorvastatin  10 mg Oral Daily  . brimonidine  1 drop Both Eyes TID  . calcium carbonate  400 mg of elemental calcium Oral BID WC  . diltiazem  45 mg Oral Q6H  . docusate sodium  100 mg Oral BID  . dorzolamide  1 drop Both Eyes TID  . feeding supplement (ENSURE ENLIVE)  237 mL Oral BID BM  . furosemide  40 mg Intravenous BID  . latanoprost  1 drop Both Eyes QHS  . metoprolol succinate  75 mg Oral Daily  . multivitamin with minerals  1 tablet Oral Daily  . Rivaroxaban  15 mg Oral Q supper  . vitamin C  500 mg Oral Daily   Continuous Infusions:  PRN Meds: acetaminophen **OR** acetaminophen, ondansetron **OR** ondansetron (ZOFRAN) IV   Vital Signs    Vitals:   03/06/18 0500 03/06/18 1046 03/06/18 1152 03/06/18 1355  BP:  122/60  123/64  Pulse:   (!) 152 86  Resp:    16  Temp:    (!) 97.4 F (36.3 C)  TempSrc:    Oral  SpO2:   98% 94%  Weight: 62.8 kg     Height:        Intake/Output Summary (Last 24 hours) at 03/06/2018 1719 Last data filed at 03/05/2018 2300 Gross per 24 hour  Intake -  Output 600 ml  Net -600 ml   Filed Weights   03/04/18 2014 03/06/18 0500  Weight: 65 kg 62.8 kg    Telemetry    Atrial for ablation with RVR, heart rate at rest 100, up to 150 with exertion- Personally Reviewed  ECG    - Personally Reviewed  Physical Exam   GEN: No acute distress.   Neck: No JVD Cardiac:  Irregular rate and rhythm, rapid,, no murmurs, rubs, or gallops.  Respiratory: Clear to auscultation bilaterally. GI: Soft, nontender, non-distended  MS: 1+  woody lower extremity edema; No deformity. Neuro:  Nonfocal  Psych: Normal affect   Labs    Chemistry Recent Labs  Lab 03/04/18 2312 03/06/18 0610  NA 127* 129*  K 4.4 4.1  CL 97* 97*  CO2 25 27  GLUCOSE 117* 103*  BUN 35* 37*  CREATININE 0.61 0.91  CALCIUM 9.0 8.5*  PROT 6.5  --   ALBUMIN 3.6  --   AST 24  --   ALT 18  --   ALKPHOS 62  --   BILITOT 0.5  --   GFRNONAA >60 53*  GFRAA >60 >60  ANIONGAP 5 5     Hematology Recent Labs  Lab 03/04/18 2312 03/06/18 0610 03/06/18 1618  WBC 10.6* 8.3  --   RBC 3.31* 2.93* 3.02*  HGB 8.9* 7.9*  --   HCT 27.7* 24.3*  --   MCV 83.7 82.9  --   MCH 26.9 27.0  --   MCHC 32.1 32.5  --   RDW 18.6* 19.0*  --   PLT 395 351  --     Cardiac Enzymes Recent Labs  Lab 03/04/18 2312  TROPONINI <0.03   No  results for input(s): TROPIPOC in the last 168 hours.   BNP Recent Labs  Lab 03/04/18 2312  BNP 903.0*     DDimer No results for input(s): DDIMER in the last 168 hours.   Radiology    Dg Chest Portable 1 View  Result Date: 03/04/2018 CLINICAL DATA:  Shortness of breath EXAM: PORTABLE CHEST 1 VIEW COMPARISON:  01/11/2018 FINDINGS: There is mild cardiomegaly. Small left pleural effusion. Mild interstitial pulmonary edema. No focal consolidation. IMPRESSION: Mild cardiomegaly with small left pleural effusion and mild interstitial pulmonary edema. Electronically Signed   By: Ulyses Jarred M.D.   On: 03/04/2018 23:27    Cardiac Studies     Patient Profile     Heather Larson is a 84 year old female with a history of  chronic atrial fibrillation, on Xarelto 15 mg daily, since October 2014 hypertension,  chronic lower extremity edema Hyperlipidemia Permanent atrial fib, for 4 years Radical mastectomy seen for evaluation of worsening shortness of breath and leg swelling.  Patient reports that over the last 2 to 3 days, she has noted increasing shortness of breath and leg swelling.  Assessment & Plan    Permanent  atrial fibrillation (HCC) -  Poorly controlled rate Recommend we check a urine given it is cloudy -Previously on diltiazem 360 but this was held for leg swelling Few other options for rate control other than increasing metoprolol and restarting diltiazem Would suggest we start diltiazem 45 mg every 6 If rate continues to run high may need to increase metoprolol succinate up to 100 twice daily Last option would be add amiodarone Poor candidate for digoxin  Bilateral lower extremity edema  Chronic issue, previously noted to have swelling with high-dose diltiazem 360 Current swelling secondary to fluid overload Continue Lasix  Acute on chronic diastolic CHF Continue Lasix IV twice daily  Mixed hyperlipidemia Cholesterol is at goal on the current lipid regimen. No changes to the medications were made.stable  Essential hypertension Would hold hydralazine Restart diltiazem 45 every 6 with metoprolol   Total encounter time more than 25 minutes  Greater than 50% was spent in counseling and coordination of care with the patient   For questions or updates, please contact [place] Please consult [email or web address] for contact info under        Signed, Ida Rogue, MD  03/06/2018, 5:19 PM

## 2018-03-06 NOTE — Progress Notes (Signed)
Advance care planning  Purpose of Encounter  Parties in Attendance Patient and her healthcare power of attorney daughter  Patients Decisional capacity Patient is alert and oriented.  Able to make her own medical decisions.  Discussed with patient and daughter in detail regarding congestive heart failure which patient does not have a diagnosis of.  Treatment plan and prognosis.  All questions answered.  CODE STATUS  - DO NOT RESUSCITATE  Time spent - 18 minutes

## 2018-03-06 NOTE — Progress Notes (Signed)
[place] at [place] NAME: Heather Larson    MR#:  [id number]  DATE OF BIRTH:  [birth year]  SUBJECTIVE:  CHIEF COMPLAINT:   Chief Complaint  Patient presents with  . Shortness of Breath   - still short of breath - on room air now  REVIEW OF SYSTEMS:  Review of Systems  Constitutional: Positive for malaise/fatigue. Negative for chills and fever.  HENT: Negative for congestion, ear discharge, hearing loss and nosebleeds.   Eyes: Negative for blurred vision and double vision.  Respiratory: Positive for shortness of breath. Negative for cough and wheezing.   Cardiovascular: Positive for leg swelling. Negative for chest pain and palpitations.  Gastrointestinal: Negative for abdominal pain, constipation, diarrhea, nausea and vomiting.  Genitourinary: Negative for dysuria.  Musculoskeletal: Negative for myalgias.  Neurological: Negative for dizziness, seizures, weakness and headaches.    DRUG ALLERGIES:  No Known Allergies  VITALS:  Blood pressure 123/64, pulse 86, temperature (!) 97.4 F (36.3 C), temperature source Oral, resp. rate 16, height 5\' 2"  (1.575 m), weight 62.8 kg, SpO2 94 %.  PHYSICAL EXAMINATION:  Physical Exam  GENERAL:  84 y.o.-year-old elderly patient lying in the bed with no acute distress.  EYES: Pupils equal, round, reactive to light and accommodation. No scleral icterus. Extraocular muscles intact.  HEENT: Head atraumatic, normocephalic. Oropharynx and nasopharynx clear.  NECK:  Supple, no jugular venous distention. No thyroid enlargement, no tenderness.  LUNGS: Normal breath sounds bilaterally, no wheezing,  rhonchi or crepitation. No use of accessory muscles of respiration. Fine bibasilar crackles CARDIOVASCULAR: S1, S2 normal. No  rubs, or gallops. 2/6 systolic murmur present ABDOMEN: Soft, nontender, nondistended. Bowel sounds present. No organomegaly or mass.  EXTREMITIES: No  cyanosis, or clubbing. 2+ tight  pitting edema on legs noted. NEUROLOGIC: Cranial nerves II through XII are intact. Muscle strength 5/5 in all extremities. Sensation intact. Gait not checked. Global weakness PSYCHIATRIC: The patient is alert and oriented x 3.  SKIN: No obvious rash, lesion, or ulcer.    LABORATORY PANEL:   CBC Recent Labs  Lab 03/06/18 0610  WBC 8.3  HGB 7.9*  HCT 24.3*  PLT 351   ------------------------------------------------------------------------------------------------------------------  Chemistries  Recent Labs  Lab 03/04/18 2312 03/06/18 0610  NA 127* 129*  K 4.4 4.1  CL 97* 97*  CO2 25 27  GLUCOSE 117* 103*  BUN 35* 37*  CREATININE 0.61 0.91  CALCIUM 9.0 8.5*  AST 24  --   ALT 18  --   ALKPHOS 62  --   BILITOT 0.5  --    ------------------------------------------------------------------------------------------------------------------  Cardiac Enzymes Recent Labs  Lab 03/04/18 2312  TROPONINI <0.03   ------------------------------------------------------------------------------------------------------------------  RADIOLOGY:  Dg Chest Portable 1 View  Result Date: 03/04/2018 CLINICAL DATA:  Shortness of breath EXAM: PORTABLE CHEST 1 VIEW COMPARISON:  01/11/2018 FINDINGS: There is mild cardiomegaly. Small left pleural effusion. Mild interstitial pulmonary edema. No focal consolidation. IMPRESSION: Mild cardiomegaly with small left pleural effusion and mild interstitial pulmonary edema. Electronically Signed   By: Ulyses Jarred M.D.   On: 03/04/2018 23:27    EKG:   Orders placed or performed during the hospital encounter of 03/04/18  . ED EKG  . ED EKG  . EKG 12-Lead  . EKG 12-Lead  . EKG    ASSESSMENT AND PLAN:   84 year old female with past medical history significant for A. fib on Xarelto, hypertension, hyperlipidemia, history of breast cancer and macular degeneration from an independent living  facility presents to hospital secondary to shortness of  breath.  1.  Acute CHF-echo is done and is pending. -Continue IV Lasix twice daily.  Still has significant pedal edema and dyspneic on exam. -Strict input and output monitoring.  2.  A. fib with RVR-heart rates now well controlled.  Contributing to the heart failure likely -On Toprol orally.  Added Cardizem today -Continue to monitor heart rate.  On Xarelto for anticoagulation.  3.  Hyperlipidemia-continue statin  4.  Hypertension-currently on Cardizem, Toprol and Lasix  5.  DVT prophylaxis-Xarelto  6.  Anemia-drop noted since being admitted here to the hospital.  Check CBC and anemia labs in a.m.  Physical therapy recommended home health   All the records are reviewed and case discussed with Care Management/Social Workerr. Management plans discussed with the patient, family and they are in agreement.  CODE STATUS: DNR  TOTAL TIME TAKING CARE OF THIS PATIENT: 39 minutes.   POSSIBLE D/C IN 2 DAYS, DEPENDING ON CLINICAL CONDITION.   Gladstone Lighter M.D on 03/06/2018 at 3:46 PM  Between 7am to 6pm - Pager - [phone]  After 6pm go to [email or web address] - Proofreader  [facility]  Office  [phone]  CC: Primary care physician; Pleas Koch, NP

## 2018-03-07 LAB — BASIC METABOLIC PANEL
Anion gap: 8 (ref 5–15)
BUN: 38 mg/dL — AB (ref 8–23)
CALCIUM: 8.5 mg/dL — AB (ref 8.9–10.3)
CO2: 26 mmol/L (ref 22–32)
Chloride: 95 mmol/L — ABNORMAL LOW (ref 98–111)
Creatinine, Ser: 0.78 mg/dL (ref 0.44–1.00)
GFR calc Af Amer: >60 mL/min (ref >60)
Glucose, Bld: 102 mg/dL — ABNORMAL HIGH (ref 70–99)
Potassium: 4 mmol/L (ref 3.5–5.1)
Sodium: 129 mmol/L — ABNORMAL LOW (ref 135–145)

## 2018-03-07 LAB — CBC WITH DIFFERENTIAL/PLATELET
ABS IMMATURE GRANULOCYTES: 0.03 10*3/uL (ref 0.00–0.07)
BASOS PCT: 1 %
Basophils Absolute: 0 10*3/uL (ref 0.0–0.1)
EOS ABS: 0.3 10*3/uL (ref 0.0–0.5)
Eosinophils Relative: 4 %
HCT: 25 % — ABNORMAL LOW (ref 36.0–46.0)
Hemoglobin: 7.9 g/dL — ABNORMAL LOW (ref 12.0–15.0)
Immature Granulocytes: 0 %
Lymphocytes Relative: 23 %
Lymphs Abs: 2 10*3/uL (ref 0.7–4.0)
MCH: 26.4 pg (ref 26.0–34.0)
MCHC: 31.6 g/dL (ref 30.0–36.0)
MCV: 83.6 fL (ref 80.0–100.0)
MONO ABS: 1.1 10*3/uL — AB (ref 0.1–1.0)
Monocytes Relative: 13 %
Neutro Abs: 5 10*3/uL (ref 1.7–7.7)
Neutrophils Relative %: 59 %
PLATELETS: 365 10*3/uL (ref 150–400)
RBC: 2.99 MIL/uL — ABNORMAL LOW (ref 3.87–5.11)
RDW: 18.8 % — AB (ref 11.5–15.5)
WBC: 8.4 10*3/uL (ref 4.0–10.5)
nRBC: 0 % (ref 0.0–0.2)

## 2018-03-07 LAB — URINALYSIS, COMPLETE (UACMP) WITH MICROSCOPIC
BILIRUBIN URINE: NEGATIVE
Glucose, UA: NEGATIVE mg/dL
Hgb urine dipstick: NEGATIVE
KETONES UR: NEGATIVE mg/dL
NITRITE: NEGATIVE
PH: 7 (ref 5.0–8.0)
PROTEIN: NEGATIVE mg/dL
Specific Gravity, Urine: 1.009 (ref 1.005–1.030)
Squamous Epithelial / LPF: NONE SEEN (ref 0–5)

## 2018-03-07 MED ORDER — PANTOPRAZOLE SODIUM 40 MG PO TBEC
40.0000 mg | DELAYED_RELEASE_TABLET | Freq: Every day | ORAL | Status: DC
  Administered 2018-03-08: 40 mg via ORAL
  Filled 2018-03-07: qty 1

## 2018-03-07 MED ORDER — DIPHENHYDRAMINE HCL 25 MG PO CAPS
25.0000 mg | ORAL_CAPSULE | Freq: Once | ORAL | Status: AC
  Administered 2018-03-08: 02:00:00 25 mg via ORAL
  Filled 2018-03-07: qty 1

## 2018-03-07 MED ORDER — SODIUM CHLORIDE 0.9 % IV SOLN
200.0000 mg | INTRAVENOUS | Status: DC
  Administered 2018-03-07: 11:00:00 200 mg via INTRAVENOUS
  Filled 2018-03-07 (×3): qty 10

## 2018-03-07 MED ORDER — DILTIAZEM HCL ER COATED BEADS 180 MG PO CP24
180.0000 mg | ORAL_CAPSULE | Freq: Every day | ORAL | Status: DC
  Administered 2018-03-07 – 2018-03-08 (×2): 180 mg via ORAL
  Filled 2018-03-07 (×2): qty 1

## 2018-03-07 NOTE — Progress Notes (Addendum)
Progress Note  Patient Name: Heather Larson Date of Encounter: 03/07/2018  Primary Cardiologist: Dr. Rockey Situ  Subjective   She is sitting in the recliner at the time of the exam. She denies SOB, CP, palpitations, or racing heart rate. She continues to have LEE though improved from admission. She still feels LEE is increased from her baseline chronic swelling. Heart sounds IRIR but rate controlled. She reported she worked with PT yesterday and ambulated successfully.   We reviewed the results of yesterday's 11/6 echo together. We reviewed the difference between Atrial Fibrillation and HFpEF.   Diltiazem added yesterday for further rate control. We discussed this medication together. We also discussed the importance of anticoagulation and rate control with Afib.  Inpatient Medications    Scheduled Meds: . acidophilus  2 capsule Oral TID  . atorvastatin  10 mg Oral Daily  . brimonidine  1 drop Both Eyes TID  . calcium carbonate  400 mg of elemental calcium Oral BID WC  . diltiazem  180 mg Oral Daily  . diphenhydrAMINE  25 mg Oral Once  . docusate sodium  100 mg Oral BID  . dorzolamide  1 drop Both Eyes TID  . feeding supplement (ENSURE ENLIVE)  237 mL Oral BID BM  . furosemide  40 mg Intravenous BID  . latanoprost  1 drop Both Eyes QHS  . metoprolol succinate  75 mg Oral Daily  . multivitamin with minerals  1 tablet Oral Daily  . pantoprazole  40 mg Oral Daily  . Rivaroxaban  15 mg Oral Q supper  . vitamin C  500 mg Oral Daily   Continuous Infusions: . iron sucrose 200 mg (03/07/18 1032)   PRN Meds: acetaminophen **OR** acetaminophen, ondansetron **OR** ondansetron (ZOFRAN) IV   Vital Signs    Vitals:   03/07/18 0800 03/07/18 1200 03/07/18 1421 03/07/18 1550  BP: 124/66 (!) 119/45 (!) 113/49 (!) 107/45  Pulse: 75 (!) 58 77 68  Resp: 20 19 18 19   Temp: 97.9 F (36.6 C) 97.7 F (36.5 C) 98 F (36.7 C) 98 F (36.7 C)  TempSrc: Oral Oral Oral Oral  SpO2: 96% 99% 99%  98%  Weight:      Height:        Intake/Output Summary (Last 24 hours) at 03/07/2018 1653 Last data filed at 03/07/2018 0830 Gross per 24 hour  Intake -  Output 325 ml  Net -325 ml   Filed Weights   03/04/18 2014 03/06/18 0500 03/07/18 0634  Weight: 65 kg 62.8 kg 59.7 kg    Telemetry    IRIR, ventricular rates more controlled and in 70-80s - Personally Reviewed  ECG  N/A  Physical Exam   GEN: Frail, elderly woman No acute distress.  Sitting in recliner by bed Neck: No JVD Cardiac: IRIR with controlled ventricular rates; no murmurs, rubs, or gallops.  Respiratory: Trace bilateral rales, otherwise clear to auscultation bilaterally. GI: Soft, nontender, non-distended  MS: B/l and chronic LEE; No deformity. Neuro:  Nonfocal  Psych: Normal affect   Labs    Chemistry Recent Labs  Lab 03/04/18 2312 03/06/18 0610 03/07/18 0412  NA 127* 129* 129*  K 4.4 4.1 4.0  CL 97* 97* 95*  CO2 25 27 26   GLUCOSE 117* 103* 102*  BUN 35* 37* 38*  CREATININE 0.61 0.91 0.78  CALCIUM 9.0 8.5* 8.5*  PROT 6.5  --   --   ALBUMIN 3.6  --   --   AST 24  --   --  ALT 18  --   --   ALKPHOS 62  --   --   BILITOT 0.5  --   --   GFRNONAA >60 53* >60  GFRAA >60 >60 >60  ANIONGAP 5 5 8      Hematology Recent Labs  Lab 03/04/18 2312 03/06/18 0610 03/06/18 1618 03/07/18 0412  WBC 10.6* 8.3  --  8.4  RBC 3.31* 2.93* 3.02* 2.99*  HGB 8.9* 7.9*  --  7.9*  HCT 27.7* 24.3*  --  25.0*  MCV 83.7 82.9  --  83.6  MCH 26.9 27.0  --  26.4  MCHC 32.1 32.5  --  31.6  RDW 18.6* 19.0*  --  18.8*  PLT 395 351  --  365    Cardiac Enzymes Recent Labs  Lab 03/04/18 2312  TROPONINI <0.03   No results for input(s): TROPIPOC in the last 168 hours.   BNP Recent Labs  Lab 03/04/18 2312  BNP 903.0*     DDimer No results for input(s): DDIMER in the last 168 hours.   Radiology    No results found.  Cardiac Studies   03/05/2018 TTE Study Conclusions - Left ventricle: The cavity size  was normal. Systolic function was   normal. The estimated ejection fraction was in the range of 60%   to 65%. Wall motion was normal; there were no regional wall   motion abnormalities. - Aortic valve: Transvalvular velocity was not recorded. - Mitral valve: There was mild regurgitation. - Left atrium: The atrium was mildly dilated. - Right ventricle: Poorly visualized. Systolic function was grossly   normal. - Pulmonary arteries: Systolic pressure was within the normal   range.  Patient Profile     84 y.o. female with a hx of permanent Atrial fibrillation on Xarelto, HTN, chronic LEE, and HLD though in 2018 stopped statin d/t LDL within range who is being seen today for the evaluation of CHF exacerbation.   Assessment & Plan   1. Permanent Atrial Fibrillation with RVR - Ventricular rate controlled in 70s-80s - Restarted diltiazem yesterday. Continue. Continue metoprolol and up-titrate if needed for further rate control. Continue anticoagulation with Xarelto.  - Continue to monitor on telemetry.  2. Acute on Chronic HFpEF - Echo as above with normal EF.  - Chronic LEE, increased at admission and improving with HR control, lasix - Continue to monitor I/O, daily weights. BMET to monitor renal function and electrolytes.  - Cr 0.78, K 4.0 - Continue medical management: Lipitor, diltiazem, lasix, Toprol XL   3. HLD - Continue statin  4. HTN - Hydralazine held  - Diltiazem restarted - Continue   5. Urine Culture - UA pending as cloudy urine noted  11/6  6. Anemia - Monitor with recommendation to transfuse with Hgb below 8 - Consider type and cross  - Per IM   For questions or updates, please contact [place] Please consult [email or web address] for contact info under        Signed, Arvil Chaco, PA-C  03/07/2018, 4:53 PM

## 2018-03-07 NOTE — Progress Notes (Signed)
[place] at [place] NAME: Nohelia Valenza    MR#:  [id number]  DATE OF BIRTH:  26-Mar-1924  SUBJECTIVE:  CHIEF COMPLAINT:   Chief Complaint  Patient presents with  . Shortness of Breath   -Doing pretty well.  Heart rate is better controlled.  Still has lower extremity edema.  Not much urine output has been reported.  REVIEW OF SYSTEMS:  Review of Systems  Constitutional: Positive for malaise/fatigue. Negative for chills and fever.  HENT: Negative for congestion, ear discharge, hearing loss and nosebleeds.   Eyes: Negative for blurred vision and double vision.  Respiratory: Positive for shortness of breath. Negative for cough and wheezing.   Cardiovascular: Positive for leg swelling. Negative for chest pain and palpitations.  Gastrointestinal: Negative for abdominal pain, constipation, diarrhea, nausea and vomiting.  Genitourinary: Negative for dysuria.  Musculoskeletal: Negative for myalgias.  Neurological: Negative for dizziness, seizures, weakness and headaches.    DRUG ALLERGIES:  No Known Allergies  VITALS:  Blood pressure (!) 119/45, pulse (!) 58, temperature 97.7 F (36.5 C), temperature source Oral, resp. rate 19, height 5\' 2"  (1.575 m), weight 59.7 kg, SpO2 99 %.  PHYSICAL EXAMINATION:  Physical Exam  GENERAL:  84 y.o.-year-old elderly patient lying in the bed with no acute distress.  EYES: Pupils equal, round, reactive to light and accommodation. No scleral icterus. Extraocular muscles intact.  HEENT: Head atraumatic, normocephalic. Oropharynx and nasopharynx clear.  NECK:  Supple, no jugular venous distention. No thyroid enlargement, no tenderness.  LUNGS: Normal breath sounds bilaterally, no wheezing,  rhonchi or crepitation. No use of accessory muscles of respiration. Fine bibasilar crackles CARDIOVASCULAR: S1, S2 normal. No  rubs, or gallops. 2/6 systolic murmur present ABDOMEN: Soft, nontender, nondistended. Bowel  sounds present. No organomegaly or mass.  EXTREMITIES: No  cyanosis, or clubbing. 2+ tight pitting edema on legs noted. NEUROLOGIC: Cranial nerves II through XII are intact. Muscle strength 5/5 in all extremities. Sensation intact. Gait not checked. Global weakness PSYCHIATRIC: The patient is alert and oriented x 3.  SKIN: No obvious rash, lesion, or ulcer.    LABORATORY PANEL:   CBC Recent Labs  Lab 03/07/18 0412  WBC 8.4  HGB 7.9*  HCT 25.0*  PLT 365   ------------------------------------------------------------------------------------------------------------------  Chemistries  Recent Labs  Lab 03/04/18 2312  03/07/18 0412  NA 127*   < > 129*  K 4.4   < > 4.0  CL 97*   < > 95*  CO2 25   < > 26  GLUCOSE 117*   < > 102*  BUN 35*   < > 38*  CREATININE 0.61   < > 0.78  CALCIUM 9.0   < > 8.5*  AST 24  --   --   ALT 18  --   --   ALKPHOS 62  --   --   BILITOT 0.5  --   --    < > = values in this interval not displayed.   ------------------------------------------------------------------------------------------------------------------  Cardiac Enzymes Recent Labs  Lab 03/04/18 2312  TROPONINI <0.03   ------------------------------------------------------------------------------------------------------------------  RADIOLOGY:  No results found.  EKG:   Orders placed or performed during the hospital encounter of 03/04/18  . ED EKG  . ED EKG  . EKG 12-Lead  . EKG 12-Lead  . EKG    ASSESSMENT AND PLAN:   84 year old female with past medical history significant for A. fib on Xarelto, hypertension, hyperlipidemia, history of breast cancer and macular  degeneration from an independent living facility presents to hospital secondary to shortness of breath.  1.  Acute CHF-acute diastolic CHF. -Echocardiogram showing EF of 60 to 65% with no regional wall motion abnormalities. -Continue IV Lasix twice daily.  Still has significant pedal edema and dyspneic on  exam. -Strict input and output monitoring.  2.  A. fib with RVR-heart rates now well controlled.  Contributing to the heart failure -On Toprol orally.  Added Cardizem -changed to long-acting.  Heart rate is well controlled. -Continue to monitor heart rate.  On Xarelto for anticoagulation.  3.  Hyperlipidemia-continue statin  4.  Hypertension-currently on Cardizem, Toprol and Lasix  5.  DVT prophylaxis-Xarelto  6.  Anemia-drop noted since being admitted here to the hospital.   -Significantly iron deficient. Give IV iron while in the hospital.  Baseline hemoglobin is between 8 and 9, also on Xarelto.  No active bleeding noted. -Outpatient GI follow-up recommended.   - will add PPI  Physical therapy recommended home health   All the records are reviewed and case discussed with Care Management/Social Workerr. Management plans discussed with the patient, family and they are in agreement.  CODE STATUS: DNR  TOTAL TIME TAKING CARE OF THIS PATIENT: 37 minutes.   POSSIBLE D/C IN 2 DAYS, DEPENDING ON CLINICAL CONDITION.   Gladstone Lighter M.D on 03/07/2018 at 1:03 PM  Between 7am to 6pm - Pager - [phone]  After 6pm go to [email or web address] - Proofreader  [facility]  Office  [phone]  CC: Primary care physician; Pleas Koch, NP

## 2018-03-08 DIAGNOSIS — I509 Heart failure, unspecified: Secondary | ICD-10-CM

## 2018-03-08 DIAGNOSIS — D649 Anemia, unspecified: Secondary | ICD-10-CM

## 2018-03-08 LAB — CBC
HCT: 24.1 % — ABNORMAL LOW (ref 36.0–46.0)
Hemoglobin: 7.6 g/dL — ABNORMAL LOW (ref 12.0–15.0)
MCH: 26.7 pg (ref 26.0–34.0)
MCHC: 31.5 g/dL (ref 30.0–36.0)
MCV: 84.6 fL (ref 80.0–100.0)
NRBC: 0 % (ref 0.0–0.2)
Platelets: 366 10*3/uL (ref 150–400)
RBC: 2.85 MIL/uL — AB (ref 3.87–5.11)
RDW: 18.6 % — AB (ref 11.5–15.5)
WBC: 6.5 10*3/uL (ref 4.0–10.5)

## 2018-03-08 LAB — PREPARE RBC (CROSSMATCH)

## 2018-03-08 LAB — BASIC METABOLIC PANEL
ANION GAP: 8 (ref 5–15)
BUN: 38 mg/dL — ABNORMAL HIGH (ref 8–23)
CHLORIDE: 94 mmol/L — AB (ref 98–111)
CO2: 27 mmol/L (ref 22–32)
CREATININE: 0.98 mg/dL (ref 0.44–1.00)
Calcium: 8.5 mg/dL — ABNORMAL LOW (ref 8.9–10.3)
GFR calc non Af Amer: 49 mL/min — ABNORMAL LOW (ref >60)
GFR, EST AFRICAN AMERICAN: 56 mL/min — AB (ref >60)
Glucose, Bld: 138 mg/dL — ABNORMAL HIGH (ref 70–99)
Potassium: 4 mmol/L (ref 3.5–5.1)
SODIUM: 129 mmol/L — AB (ref 135–145)

## 2018-03-08 LAB — ABO/RH: ABO/RH(D): O POS

## 2018-03-08 MED ORDER — FUROSEMIDE 40 MG PO TABS
40.0000 mg | ORAL_TABLET | Freq: Every day | ORAL | Status: DC
  Administered 2018-03-08: 40 mg via ORAL
  Filled 2018-03-08: qty 1

## 2018-03-08 MED ORDER — SODIUM CHLORIDE 0.9% IV SOLUTION
Freq: Once | INTRAVENOUS | Status: AC
  Administered 2018-03-08: 11:00:00 via INTRAVENOUS

## 2018-03-08 MED ORDER — PANTOPRAZOLE SODIUM 40 MG PO TBEC: 40.0000 mg | DELAYED_RELEASE_TABLET | Freq: Every day | ORAL | 2 refills | Status: DC

## 2018-03-08 MED ORDER — CEPHALEXIN 500 MG PO CAPS: 500.0000 mg | ORAL_CAPSULE | Freq: Two times a day (BID) | ORAL | 0 refills | Status: AC

## 2018-03-08 MED ORDER — CEPHALEXIN 500 MG PO CAPS
500.0000 mg | ORAL_CAPSULE | Freq: Two times a day (BID) | ORAL | Status: DC
  Administered 2018-03-08: 13:00:00 500 mg via ORAL
  Filled 2018-03-08: qty 1

## 2018-03-08 MED ORDER — FUROSEMIDE 40 MG PO TABS: 40.0000 mg | ORAL_TABLET | Freq: Every day | ORAL | 2 refills | Status: DC

## 2018-03-08 MED ORDER — DILTIAZEM HCL ER COATED BEADS 180 MG PO CP24: 180.0000 mg | ORAL_CAPSULE | Freq: Every day | ORAL | 2 refills | Status: DC

## 2018-03-08 NOTE — Care Management Important Message (Signed)
Important Message  Patient Details  Name: Heather Larson MRN: [id number] Date of Birth: 1924/01/22   Medicare Important Message Given:  Yes    [name] A Donel Osowski 03/08/2018, 11:27 AM

## 2018-03-08 NOTE — Progress Notes (Signed)
   03/08/18 1135  Clinical Encounter Type  Visited With Patient not available  Visit Type Initial;Spiritual support  Referral From Nurse  Consult/Referral To Chaplain  Spiritual Encounters  Spiritual Needs Other (Comment)   [place] attempted to make a visit with the patient but she was with case management. I will check back later today.

## 2018-03-08 NOTE — Discharge Summary (Signed)
[facility] [place] NAME: Heather Larson    MR#:  [id number]  DATE OF BIRTH:  July 16, 1923  DATE OF ADMISSION:  03/04/2018   ADMITTING PHYSICIAN: Harrie Foreman, MD  DATE OF DISCHARGE:  03/08/18  PRIMARY CARE PHYSICIAN: Pleas Koch, NP   ADMISSION DIAGNOSIS:   Pleural effusion [J90] Chronic atrial fibrillation [I48.20] Current use of long term anticoagulation [Z79.01] Anemia, unspecified type [D64.9] Dyspnea, unspecified type [R06.00] Edema, unspecified type [R60.9] Acute congestive heart failure, unspecified heart failure type ([place]) [I50.9]  DISCHARGE DIAGNOSIS:   Active Problems:   CHF exacerbation (HCC)   Acute on chronic diastolic heart failure (HCC)   Longstanding persistent atrial fibrillation   SECONDARY DIAGNOSIS:   Past Medical History:  Diagnosis Date  . Atrial fibrillation, permanent    on Xarelto  . Bilateral lower extremity edema   . Bilateral lower leg cellulitis 11/11/2017  . Cancer [facility])    breast cancer at age 42 and skin  . Cellulitis    lower abdomen  . Diarrhea 01/11/2018  . Hyperlipidemia   . Hypertension   . Hypertension   . Incontinence   . Low sodium levels   . Macular degeneration   . Malnutrition of moderate degree 11/23/2017    HOSPITAL COURSE:   84 year old female with past medical history significant for A. fib on Xarelto, hypertension, hyperlipidemia, history of breast cancer and macular degeneration from an independent living facility presents to hospital secondary to shortness of breath.  1.  Acute CHF-acute diastolic CHF. -Echocardiogram showing EF of 60 to 65% with no regional wall motion abnormalities. -received IV Lasix twice daily.  will be discharged on daly lasix and CHF clinic f/u -Urine output was not that great, however her breathing has improved and her creatinine remained stable. -Appreciate cardiology consult  2.  A. fib with RVR-heart rates now well  controlled.  Contributing to the heart failure -Could have been triggered by her anemia -On Toprol orally.  Added Cardizem -changed to long-acting.  Heart rate is well controlled. -Continue to monitor heart rate.  On Xarelto for anticoagulation.  3.  Hyperlipidemia-continue statin  4.  Hypertension-on Cardizem, Toprol and Lasix  5.  Anemia-drop noted since being admitted here to the hospital.    Baseline seems to be around 8-9 -Given her cardiac condition, goal is to keep it greater than 8. -Severe iron deficiency noted.  Received IV iron in the hospital.  Will need GI evaluation as outpatient when stable. -Also receiving 1 unit of blood prior to discharge as her hemoglobin was 7.6. -Continue iron supplements.  Continue PPI for now  Physical therapy recommended home health Will be discharged to day back to [facility].   DISCHARGE CONDITIONS:   Guarded  CONSULTS OBTAINED:   Treatment Team:  Nelva Bush, MD  DRUG ALLERGIES:   No Known Allergies DISCHARGE MEDICATIONS:   Allergies as of 03/08/2018   No Known Allergies     Medication List    STOP taking these medications   bumetanide 1 MG tablet Commonly known as:  BUMEX   hydrALAZINE 50 MG tablet Commonly known as:  APRESOLINE   meloxicam 7.5 MG tablet Commonly known as:  MOBIC     TAKE these medications   acidophilus Caps capsule Take 2 capsules by mouth 3 (three) times daily.   albuterol 108 (90 Base) MCG/ACT inhaler Commonly known as:  PROVENTIL HFA;VENTOLIN HFA Inhale 2 puffs into the lungs every 6 (  six) hours as needed for wheezing or shortness of breath.   atorvastatin 10 MG tablet Commonly known as:  LIPITOR Take 1 tablet (10 mg total) by mouth daily.   brimonidine 0.2 % ophthalmic solution Commonly known as:  ALPHAGAN Place 1 drop into both eyes 3 (three) times daily.   calcium carbonate 1500 (600 Ca) MG Tabs tablet Commonly known as:  OSCAL Take by mouth 2 (two)  times daily with a meal.   cephALEXin 500 MG capsule Commonly known as:  KEFLEX Take 1 capsule (500 mg total) by mouth every 12 (twelve) hours for 5 days.   diltiazem 180 MG 24 hr capsule Commonly known as:  CARDIZEM CD Take 1 capsule (180 mg total) by mouth daily. Start taking on:  03/09/2018   dorzolamide 2 % ophthalmic solution Commonly known as:  TRUSOPT Place 1 drop into both eyes 3 (three) times daily.   feeding supplement (ENSURE ENLIVE) Liqd Take 237 mLs by mouth 2 (two) times daily between meals.   furosemide 40 MG tablet Commonly known as:  LASIX Take 1 tablet (40 mg total) by mouth daily.   FUSION PLUS Caps Take 130 mg by mouth daily.   metoprolol succinate 25 MG 24 hr tablet Commonly known as:  TOPROL-XL Take 3 tablets (75 mg total) by mouth daily.   MULTIVITAMIN ADULT Tabs Take 1 tablet by mouth daily.   pantoprazole 40 MG tablet Commonly known as:  PROTONIX Take 1 tablet (40 mg total) by mouth daily. Start taking on:  03/09/2018   Rivaroxaban 15 MG Tabs tablet Commonly known as:  XARELTO Take 1 tablet (15 mg total) by mouth daily.   TRAVATAN Z 0.004 % Soln ophthalmic solution Generic drug:  Travoprost (BAK Free) Place 1 drop into both eyes at bedtime.   vitamin C 500 MG tablet Commonly known as:  ASCORBIC ACID Take 500 mg by mouth daily.        DISCHARGE INSTRUCTIONS:   1.  PCP follow-up in 1 to 2 weeks 2.  GI evaluation in 2 weeks for iron deficiency anemia 3.  Cardiology follow-up in 1 to 2 weeks  DIET:   Cardiac diet  ACTIVITY:   Activity as tolerated  OXYGEN:   Home Oxygen: No.  Oxygen Delivery: room air  DISCHARGE LOCATION:   home   If you experience worsening of your admission symptoms, develop shortness of breath, life threatening emergency, suicidal or homicidal thoughts you must seek medical attention immediately by calling 911 or calling your MD immediately  if symptoms less severe.  You Must read complete  instructions/literature along with all the possible adverse reactions/side effects for all the Medicines you take and that have been prescribed to you. Take any new Medicines after you have completely understood and accpet all the possible adverse reactions/side effects.   Please note  You were cared for by a hospitalist during your hospital stay. If you have any questions about your discharge medications or the care you received while you were in the hospital after you are discharged, you can call the unit and asked to speak with the hospitalist on call if the hospitalist that took care of you is not available. Once you are discharged, your primary care physician will handle any further medical issues. Please note that NO REFILLS for any discharge medications will be authorized once you are discharged, as it is imperative that you return to your primary care physician (or establish a relationship with a primary care physician if you do  not have one) for your aftercare needs so that they can reassess your need for medications and monitor your lab values.    On the day of Discharge:  VITAL SIGNS:   Blood pressure 126/88, pulse 78, temperature 98.6 F (37 C), temperature source Oral, resp. rate 18, height 5\' 2"  (1.575 m), weight 64.7 kg, SpO2 98 %.  PHYSICAL EXAMINATION:    GENERAL:  84 y.o.-year-old elderly patient lying in the bed with no acute distress.  EYES: Pupils equal, round, reactive to light and accommodation. No scleral icterus. Extraocular muscles intact. Erythematous conjunctiva HEENT: Head atraumatic, normocephalic. Oropharynx and nasopharynx clear.  NECK:  Supple, no jugular venous distention. No thyroid enlargement, no tenderness.  LUNGS: Normal breath sounds bilaterally, no wheezing,  rhonchi or crepitation. No use of accessory muscles of respiration.  No rales at the bases CARDIOVASCULAR: S1, S2 normal. No  rubs, or gallops. 2/6 systolic murmur present ABDOMEN: Soft,  nontender, nondistended. Bowel sounds present. No organomegaly or mass.  EXTREMITIES: No  cyanosis, or clubbing. 2+  pitting edema on legs noted. NEUROLOGIC: Cranial nerves II through XII are intact. Muscle strength 5/5 in all extremities. Sensation intact. Gait not checked. Global weakness PSYCHIATRIC: The patient is alert and oriented x 3.  SKIN: No obvious rash, lesion, or ulcer.   DATA REVIEW:   CBC Recent Labs  Lab 03/08/18 0534  WBC 6.5  HGB 7.6*  HCT 24.1*  PLT 366    Chemistries  Recent Labs  Lab 03/04/18 2312  03/08/18 0534  NA 127*   < > 129*  K 4.4   < > 4.0  CL 97*   < > 94*  CO2 25   < > 27  GLUCOSE 117*   < > 138*  BUN 35*   < > 38*  CREATININE 0.61   < > 0.98  CALCIUM 9.0   < > 8.5*  AST 24  --   --   ALT 18  --   --   ALKPHOS 62  --   --   BILITOT 0.5  --   --    < > = values in this interval not displayed.     Microbiology Results  Results for orders placed or performed during the hospital encounter of 03/04/18  Urine Culture     Status: Abnormal (Preliminary result)   Collection Time: 03/07/18  3:39 AM  Result Value Ref Range Status   Specimen Description   Final    URINE, RANDOM Performed at [facility], [address], [place], [place] [address]    Special Requests   Final    NONE Performed at [facility], [address] [place], [place] [address]    Culture (A)  Final    >=100,000 COLONIES/mL KLEBSIELLA PNEUMONIAE CULTURE REINCUBATED FOR BETTER GROWTH Performed at [facility], [address] [place], [place] [address]    Report Status PENDING  Incomplete    RADIOLOGY:  No results found.   Management plans discussed with the patient, family and they are in agreement.  CODE STATUS:     Code Status Orders  (From admission, onward)         Start     Ordered   03/05/18 0524  Do not attempt resuscitation (DNR)  Continuous    Question Answer Comment  In the event of cardiac or respiratory ARREST  Do not call a "code blue"   In the event of cardiac or respiratory ARREST Do not  perform Intubation, CPR, defibrillation or ACLS   In the event of cardiac or respiratory ARREST Use medication by any route, position, wound care, and other measures to relive pain and suffering. May use oxygen, suction and manual treatment of airway obstruction as needed for comfort.      03/05/18 0523        Code Status History    Date Active Date Inactive Code Status Order ID Comments User Context   01/11/2018 2202 01/14/2018 2043 DNR [id number]  Dustin Flock, MD ED   11/11/2017 1804 11/27/2017 2149 DNR [id number]  Hillary Bow, MD ED   11/11/2017 1731 11/11/2017 1804 Full Code [id number]  Hillary Bow, MD ED    Advance Directive Documentation     Most Recent Value  Type of Advance Directive  Healthcare Power of [place], Living will  Pre-existing out of facility DNR order (yellow form or pink MOST form)  -  "MOST" Form in Place?  -      TOTAL TIME TAKING CARE OF THIS PATIENT: 38 minutes.    Gladstone Lighter M.D on 03/08/2018 at 2:25 PM  Between 7am to 6pm - Pager - [phone]  After 6pm go to [email or web address] - Proofreader  [facility]  Office  [phone]  CC: Primary care physician; Pleas Koch, NP   Note: This dictation was prepared with Dragon dictation along with smaller phrase technology. Any transcriptional errors that result from this process are unintentional.

## 2018-03-08 NOTE — Care Management Note (Signed)
Case Management Note  Patient Details  Name: Heather Larson MRN: [id number] Date of Birth: [birth year]  Subjective/Objective:   Patient to be discharged per MD order. Orders in place for home health services. Patient currently open to [facility] and prefer to resume with them. Referral placed with Helene Kelp who agrees to accept patient for services. No DME needs. Patient resides at [facility] and will need to use their transport services.  Ines Bloomer RN BSN RNCM [phone]                  Action/Plan:   Expected Discharge Date:  03/08/18               Expected Discharge Plan:  [place]  In-House Referral:     Discharge planning Services  CM Consult  Post Acute Care Choice:  Resumption of Svcs/PTA Provider Choice offered to:  Patient  DME Arranged:    DME Agency:     HH Arranged:  RN, PT [place] Agency:  [facility] (formerly [employer])  Status of Service:  Completed, signed off  If discussed at [employer], dates discussed:    Additional Comments:  Kathyrn Drown Victory Strollo, RN 03/08/2018, 1:00 PM

## 2018-03-08 NOTE — Progress Notes (Addendum)
Progress Note  Patient Name: Heather Larson Date of Encounter: 03/08/2018  Primary Cardiologist: Heather Larson, Heather Larson  Subjective   Lots of questions today concerning what is going on around her Receiving blood for anemia, Had iron infusion yesterday and today Klebsiella urinary tract infection being treated Scheduled to work with physical therapy Had additional day or 2 of diuresis for lower extremity edema  Inpatient Medications    Scheduled Meds: . acidophilus  2 capsule Oral TID  . atorvastatin  10 mg Oral Daily  . brimonidine  1 drop Both Eyes TID  . calcium carbonate  400 mg of elemental calcium Oral BID WC  . cephALEXin  500 mg Oral Q12H  . diltiazem  180 mg Oral Daily  . docusate sodium  100 mg Oral BID  . dorzolamide  1 drop Both Eyes TID  . feeding supplement (ENSURE ENLIVE)  237 mL Oral BID BM  . furosemide  40 mg Oral Daily  . latanoprost  1 drop Both Eyes QHS  . metoprolol succinate  75 mg Oral Daily  . multivitamin with minerals  1 tablet Oral Daily  . pantoprazole  40 mg Oral Daily  . Rivaroxaban  15 mg Oral Q supper  . vitamin C  500 mg Oral Daily   Continuous Infusions: . iron sucrose Stopped (03/07/18 1834)   PRN Meds: acetaminophen **OR** acetaminophen, ondansetron **OR** ondansetron (ZOFRAN) IV   Vital Signs    Vitals:   03/08/18 0921 03/08/18 1105 03/08/18 1138 03/08/18 1450  BP: 126/88 122/84 126/88 124/85  Pulse:  76 78 82  Resp:  18 18 18   Temp:  98.4 F (36.9 C) 98.6 F (37 C) 98.7 F (37.1 C)  TempSrc:  Oral Oral Oral  SpO2:  98% 98% 98%  Weight:      Height:        Intake/Output Summary (Last 24 hours) at 03/08/2018 1616 Last data filed at 03/08/2018 1450 Gross per 24 hour  Intake 914 ml  Output -  Net 914 ml   Filed Weights   03/06/18 0500 03/07/18 0634 03/08/18 0348  Weight: 62.8 kg 59.7 kg 64.7 kg    Telemetry    Atrial fibrillation with rate-70s and 80s personally Reviewed  ECG    - Personally Reviewed  Physical  Exam   Constitutional:  oriented to person, place, and time. No distress.  HENT:  Head: Grossly normal Eyes:  no discharge. No scleral icterus.  Neck: No JVD, no carotid bruits  Cardiovascular: Irregularly irregular,  no murmurs appreciated Trace lower extremity edema, nonpitting Pulmonary/Chest: Clear to auscultation bilaterally, no wheezes or rails Abdominal: Soft.  no distension.  no tenderness.  Musculoskeletal: Normal range of motion Neurological:  normal muscle tone. Coordination normal. No atrophy Skin: Skin warm and dry Psychiatric: normal affect, pleasant   Labs    Chemistry Recent Labs  Lab 03/04/18 2312 03/06/18 0610 03/07/18 0412 03/08/18 0534  NA 127* 129* 129* 129*  K 4.4 4.1 4.0 4.0  CL 97* 97* 95* 94*  CO2 25 27 26 27   GLUCOSE 117* 103* 102* 138*  BUN 35* 37* 38* 38*  CREATININE 0.61 0.91 0.78 0.98  CALCIUM 9.0 8.5* 8.5* 8.5*  PROT 6.5  --   --   --   ALBUMIN 3.6  --   --   --   AST 24  --   --   --   ALT 18  --   --   --   ALKPHOS 62  --   --   --  BILITOT 0.5  --   --   --   GFRNONAA >60 53* >60 49*  GFRAA >60 >60 >60 56*  ANIONGAP 5 5 8 8      Hematology Recent Labs  Lab 03/06/18 0610 03/06/18 1618 03/07/18 0412 03/08/18 0534  WBC 8.3  --  8.4 6.5  RBC 2.93* 3.02* 2.99* 2.85*  HGB 7.9*  --  7.9* 7.6*  HCT 24.3*  --  25.0* 24.1*  MCV 82.9  --  83.6 84.6  MCH 27.0  --  26.4 26.7  MCHC 32.5  --  31.6 31.5  RDW 19.0*  --  18.8* 18.6*  PLT 351  --  365 366    Cardiac Enzymes Recent Labs  Lab 03/04/18 2312  TROPONINI <0.03   No results for input(s): TROPIPOC in the last 168 hours.   BNP Recent Labs  Lab 03/04/18 2312  BNP 903.0*     DDimer No results for input(s): DDIMER in the last 168 hours.   Radiology    No results found.  Cardiac Studies     Patient Profile     Heather Larson is a 84 year old female with a history of  chronic atrial fibrillation, on Xarelto 15 mg daily, since October 2014 hypertension,  chronic  lower extremity edema Hyperlipidemia Permanent atrial fib, for 4 years Radical mastectomy seen for evaluation of worsening shortness of breath and leg swelling.  Patient reports that over the last 2 to 3 days, she has noted increasing shortness of breath and leg swelling.  Assessment & Plan    Permanent atrial fibrillation (HCC) -  Rate relatively well controlled on diltiazem extended release 180 mg daily and metoprolol 75 daily On Xarelto 15 mg daily  Bilateral lower extremity edema  Chronic issue, previously noted to have swelling with high-dose diltiazem 360 Current swelling secondary to fluid overload Continue Lasix 40 mg daily as outpatient Some medication compliance with her Lasix given polyuria  Acute on chronic diastolic CHF Continue Lasix IV twice daily  Mixed hyperlipidemia On Lipitor  Essential hypertension Blood pressure stable, hydralazine canceled  Anemia Chronic issue, received iron infusion x2 this admission and transfusion today Outpatient follow-up with GI  [facility] will sign off.   Medication Recommendations: Continue current cardiac medications Other recommendations (labs, testing, etc): No further testing needed Follow up as an outpatient: Follow-up in clinic in the next several weeks    Total encounter time more than 25 minutes  Greater than 50% was spent in counseling and coordination of care with the patient   For questions or updates, please contact [place] Please consult [email or web address] for contact info under        Signed, Ida Rogue, MD  03/08/2018, 4:16 PM

## 2018-03-08 NOTE — Discharge Planning (Signed)
Patient IV and tele removed.  RN assessment and VS revealed stability to DC home with [facility].  Discharge papers given, explained and educated. Informed of suggested FU appt and appt made. Scripts e-scribed to [employer].  Once ready - Wheeled to front and [place] transporting home via [employer].

## 2018-03-09 LAB — BPAM RBC
Blood Product Expiration Date: 201912062359
ISSUE DATE / TIME: 201911081118
UNIT TYPE AND RH: 5100

## 2018-03-09 LAB — URINE CULTURE

## 2018-03-09 LAB — TYPE AND SCREEN
ABO/RH(D): O POS
ANTIBODY SCREEN: NEGATIVE
UNIT DIVISION: 0

## 2018-03-11 ENCOUNTER — Telehealth: Payer: Self-pay | Admitting: Cardiovascular Disease

## 2018-03-11 NOTE — Telephone Encounter (Signed)
Please call to discuss why pt needs to have an appointment with the heart failure clinic

## 2018-03-11 NOTE — Telephone Encounter (Signed)
S/w patient. Explained that the Heart Failure Clinic with Otila Kluver is there to help with heart failure diagnosis and management. Also, to help keep patient out of the hospital. Patient's daughter was alerted on MyChart about the appointment. Daughter is on the way back up to [facility] from [place], [place] for a wedding.  Patient appreciative and agreeable to appointment date and time.

## 2018-03-12 ENCOUNTER — Telehealth: Payer: Self-pay

## 2018-03-12 NOTE — Telephone Encounter (Signed)
LM with caregiver, patient unavailable for TCM Call, I will call back later this pm to try and reach patient.

## 2018-03-12 NOTE — Telephone Encounter (Signed)
LM on answering machine for patient to please call back to complete TCM call.  Patient has hospital follow up already scheduled with Allie Bossier, NP on 03/15/18.

## 2018-03-12 NOTE — Progress Notes (Signed)
Patient ID: Heather Larson, female    DOB: May 07, 1923, 84 y.o.   MRN: [id number]  HPI  Heather Larson is a 84 y/o female with a history of hyperlipidemia, HTN, hyponatremia, cellulitis, atrial fibrillation, anemia and chronic heart failure.   Echo report from 03/05/18 reviewed and showed an EF of 60-65% along with mild MR and normal PA pressure.   Admitted 03/04/18 due to acute on chronic HF. Cardiology consult obtained. Initially needed IV lasix and then transitioned to oral diuretics. Atrial fibrillation medications changed. Received IV iron along with 1 unit of PRBC's. due to anemia with plan of GI evaluation outpatient. Discharged after 4 days. Admitted 01/11/18 due to hyponatremia due to dehydration along with diarrhea. Given oral vancomycin due to C. difficile colitis. Given IV fluids. Discharged after 3 days.   She presents today for her initial visit with a chief complaint of moderate fatigue upon minimal exertion. She says this has been present for several months. She has associated shortness of breath, wheezing, pedal edema, abdominal pain, nausea and back pain along with this. She denies any difficulty sleeping, abdominal distention, palpitations, chest pain or dizziness. Currently doesn't have scales but her daughter is going to get her a set of scales today.   Past Medical History:  Diagnosis Date  . Anemia   . Atrial fibrillation, permanent    on Xarelto  . Bilateral lower extremity edema   . Bilateral lower leg cellulitis 11/11/2017  . Cancer [facility])    breast cancer at age 59 and skin  . Cellulitis    lower abdomen  . CHF (congestive heart failure) ([place])   . Diarrhea 01/11/2018  . Hyperlipidemia   . Hypertension   . Hypertension   . Incontinence   . Low sodium levels   . Macular degeneration   . Malnutrition of moderate degree 11/23/2017   Past Surgical History:  Procedure Laterality Date  . BREAST SURGERY     right mastectomy  . EYE SURGERY     x3  . IR RADIOLOGIST EVAL &  MGMT  09/17/2017  . MASTECTOMY     right  . SHOULDER OPEN ROTATOR CUFF REPAIR     left  . SKIN BIOPSY     skin cancer   History reviewed. No pertinent family history. Social History   Tobacco Use  . Smoking status: Never Smoker  . Smokeless tobacco: Never Used  Substance Use Topics  . Alcohol use: Not Currently    Alcohol/week: 2.0 standard drinks    Types: 2 Shots of liquor per week    Comment: 2 drinks daily   No Known Allergies Prior to Admission medications   Medication Sig Start Date End Date Taking? Authorizing Provider  acidophilus (RISAQUAD) CAPS capsule Take 2 capsules by mouth 3 (three) times daily. 01/14/18  Yes [name], MD  albuterol (PROVENTIL HFA;VENTOLIN HFA) 108 (90 Base) MCG/ACT inhaler Inhale 2 puffs into the lungs every 6 (six) hours as needed for wheezing or shortness of breath.   Yes [provider]  atorvastatin (LIPITOR) 10 MG tablet Take 1 tablet (10 mg total) by mouth daily. 10/02/17  Yes Gollan, Kathlene November, MD  brimonidine (ALPHAGAN) 0.2 % ophthalmic solution Place 1 drop into both eyes 3 (three) times daily. 01/02/18  Yes [provider]  calcium carbonate (OSCAL) 1500 (600 Ca) MG TABS tablet Take by mouth 2 (two) times daily with a meal.   Yes [provider]  cephALEXin (KEFLEX) 500 MG capsule Take 1 capsule (500  mg total) by mouth every 12 (twelve) hours for 5 days. 03/08/18 03/13/18 Yes Gladstone Lighter, MD  diltiazem (CARDIZEM CD) 180 MG 24 hr capsule Take 1 capsule (180 mg total) by mouth daily. 03/09/18  Yes Gladstone Lighter, MD  dorzolamide (TRUSOPT) 2 % ophthalmic solution Place 1 drop into both eyes 3 (three) times daily. 01/02/18  Yes [provider]  feeding supplement, ENSURE ENLIVE, (ENSURE ENLIVE) LIQD Take 237 mLs by mouth 2 (two) times daily between meals. 11/27/17  Yes Gouru, Illene Silver, MD  furosemide (LASIX) 40 MG tablet Take 1 tablet (40 mg total) by mouth daily. 03/08/18  Yes Gladstone Lighter, MD   Iron-FA-B Cmp-C-Biot-Probiotic (FUSION PLUS) CAPS Take 130 mg by mouth daily. 02/05/18 05/06/18 Yes Vanga, Tally Due, MD  metoprolol succinate (TOPROL-XL) 25 MG 24 hr tablet Take 3 tablets (75 mg total) by mouth daily. 01/11/18  Yes Pleas Koch, NP  Multiple Vitamins-Minerals (MULTIVITAMIN ADULT) TABS Take 1 tablet by mouth daily.   Yes [provider]  pantoprazole (PROTONIX) 40 MG tablet Take 1 tablet (40 mg total) by mouth daily. 03/09/18  Yes Gladstone Lighter, MD  Rivaroxaban (XARELTO) 15 MG TABS tablet Take 1 tablet (15 mg total) by mouth daily. 10/02/17  Yes Gollan, Kathlene November, MD  TRAVATAN Z 0.004 % SOLN ophthalmic solution Place 1 drop into both eyes at bedtime.  01/03/18  Yes [provider]  vitamin C (ASCORBIC ACID) 500 MG tablet Take 500 mg by mouth daily.   Yes [provider]    Review of Systems  Constitutional: Positive for fatigue (with minimal exertion). Negative for appetite change.  HENT: Positive for rhinorrhea. Negative for congestion and sore throat.   Eyes: Negative.   Respiratory: Positive for shortness of breath and wheezing (better). Negative for cough.   Cardiovascular: Positive for leg swelling (chronic). Negative for chest pain and palpitations.  Gastrointestinal: Positive for abdominal pain (after eating breakfast today) and nausea. Negative for abdominal distention.  Endocrine: Negative.   Genitourinary: Negative.   Musculoskeletal: Positive for back pain. Negative for neck pain.  Skin: Negative.   Allergic/Immunologic: Negative.   Neurological: Negative for dizziness and light-headedness.  Hematological: Negative for adenopathy. Does not bruise/bleed easily.  Psychiatric/Behavioral: Negative for dysphoric mood and sleep disturbance (sleeping on 3-4 pillows for comfort). The patient is not nervous/anxious.     Vitals:   03/13/18 1216  BP: (!) 155/66  Pulse: 99  Resp: 18  Temp: 98.7 F (37.1 C)  SpO2: 97%  Weight: 133 lb  2 oz (60.4 kg)  Height: 5\' 2"  (1.575 m)   Wt Readings from Last 3 Encounters:  03/13/18 133 lb 2 oz (60.4 kg)  03/08/18 142 lb 11.2 oz (64.7 kg)  02/21/18 143 lb 6.4 oz (65 kg)   Lab Results  Component Value Date   CREATININE 0.98 03/08/2018   CREATININE 0.78 03/07/2018   CREATININE 0.91 03/06/2018    Physical Exam  Constitutional: She is oriented to person, place, and time. She appears well-developed and well-nourished.  HENT:  Head: Normocephalic and atraumatic.  Neck: Normal range of motion. Neck supple. No JVD present.  Cardiovascular: Normal rate. An irregularly irregular rhythm present.  Pulmonary/Chest: Effort normal. No respiratory distress. She has no rhonchi. She has no rales.  Abdominal: Soft.  Musculoskeletal:       Right lower leg: She exhibits edema (3+ pitting). She exhibits no tenderness.       Left lower leg: She exhibits edema (3+ pitting). She exhibits no tenderness.  Neurological: She is alert and oriented to person, place, and time.  Skin: Skin is warm and dry.  Psychiatric: She has a normal mood and affect. Her behavior is normal.  Nursing note and vitals reviewed.  Assessment & Plan:  1: Chronic heart failure with preserved ejection fraction- - NYHA class III - moderately fluid overloaded with bilateral pedal edema - limited in diuretic usage due to hyponatremia - not weighing but her daughter is going to get her some scales today. Instructed her to weigh every morning and call for an overnight weight gain of >2 pounds or a weekly weight gain of >5 pounds - not adding salt. Reviewed the importance of closely following a 2000mg  sodium diet and written dietary information was given to her about this - saw cardiology Rockey Situ) 10/02/17 - BNP 03/04/18 was 903.0 - receiving home health nursing and PT - patient reports receiving her flu vaccine for this season  2: HTN- - BP mildly elevated today - saw PCP Carlis Abbott) 02/14/18 - BMP 03/08/18 reviewed and showed  sodium 129, potassium 4.0, creatinine 0.98 and GFR 49  3: Anemia- - hemoglobin 03/08/18 was 7.6 - saw GI (Vanga) 02/21/18  4: Lymphedema- - stage 2 - unable to tolerate wearing compression socks - is going to try getting ACE wraps and wrapping her legs daily with removal of wraps at bedtime - limited in her ability to exercise due to symptoms - discussed using lymphapress compression boots if edema persists  Medication list was reviewed.  Return in 6 weeks or sooner for any questions/problems before then.

## 2018-03-13 ENCOUNTER — Telehealth: Payer: Self-pay

## 2018-03-13 ENCOUNTER — Telehealth: Payer: Self-pay | Admitting: Primary Care

## 2018-03-13 ENCOUNTER — Encounter: Payer: Self-pay | Admitting: Family

## 2018-03-13 ENCOUNTER — Ambulatory Visit: Payer: Medicare Other | Attending: Family | Admitting: Family

## 2018-03-13 VITALS — BP 155/66 | HR 99 | Temp 98.7°F | Resp 18 | Ht 62.0 in | Wt 133.1 lb

## 2018-03-13 DIAGNOSIS — R5383 Other fatigue: Secondary | ICD-10-CM | POA: Diagnosis present

## 2018-03-13 DIAGNOSIS — I5032 Chronic diastolic (congestive) heart failure: Secondary | ICD-10-CM | POA: Insufficient documentation

## 2018-03-13 DIAGNOSIS — Z7901 Long term (current) use of anticoagulants: Secondary | ICD-10-CM | POA: Insufficient documentation

## 2018-03-13 DIAGNOSIS — D649 Anemia, unspecified: Secondary | ICD-10-CM | POA: Insufficient documentation

## 2018-03-13 DIAGNOSIS — I890 Lymphedema, not elsewhere classified: Secondary | ICD-10-CM | POA: Insufficient documentation

## 2018-03-13 DIAGNOSIS — D509 Iron deficiency anemia, unspecified: Secondary | ICD-10-CM

## 2018-03-13 DIAGNOSIS — I482 Chronic atrial fibrillation: Secondary | ICD-10-CM | POA: Diagnosis not present

## 2018-03-13 DIAGNOSIS — Z79899 Other long term (current) drug therapy: Secondary | ICD-10-CM | POA: Diagnosis not present

## 2018-03-13 DIAGNOSIS — I110 Hypertensive heart disease with heart failure: Secondary | ICD-10-CM | POA: Insufficient documentation

## 2018-03-13 DIAGNOSIS — H3530 Unspecified macular degeneration: Secondary | ICD-10-CM | POA: Diagnosis not present

## 2018-03-13 DIAGNOSIS — I10 Essential (primary) hypertension: Secondary | ICD-10-CM | POA: Diagnosis not present

## 2018-03-13 DIAGNOSIS — F419 Anxiety disorder, unspecified: Secondary | ICD-10-CM | POA: Diagnosis not present

## 2018-03-13 DIAGNOSIS — E785 Hyperlipidemia, unspecified: Secondary | ICD-10-CM | POA: Insufficient documentation

## 2018-03-13 DIAGNOSIS — E871 Hypo-osmolality and hyponatremia: Secondary | ICD-10-CM | POA: Diagnosis not present

## 2018-03-13 DIAGNOSIS — Z853 Personal history of malignant neoplasm of breast: Secondary | ICD-10-CM | POA: Diagnosis not present

## 2018-03-13 DIAGNOSIS — E440 Moderate protein-calorie malnutrition: Secondary | ICD-10-CM | POA: Diagnosis not present

## 2018-03-13 DIAGNOSIS — R0602 Shortness of breath: Secondary | ICD-10-CM | POA: Diagnosis present

## 2018-03-13 NOTE — Telephone Encounter (Signed)
Called and noted. No need for call back.

## 2018-03-13 NOTE — Telephone Encounter (Signed)
Gave the approval to [employer]

## 2018-03-13 NOTE — Telephone Encounter (Signed)
Noted  

## 2018-03-13 NOTE — Patient Instructions (Signed)
Continue weighing daily and call for an overnight weight gain of > 2 pounds or a weekly weight gain of >5 pounds. 

## 2018-03-13 NOTE — Telephone Encounter (Signed)
Need verbal orders for Physical Therapy 2 times  a week for 6 weeks

## 2018-03-13 NOTE — Telephone Encounter (Signed)
Transition Care Management Follow-up Telephone Call   Date discharged? 03/08/2018   How have you been since you were released from the hospital? Feeling very tired and back hurting due to laying down a lot at the hospital.   Do you understand why you were in the hospital? Yes   Do you understand the discharge instructions? yes  Where were you discharged to? Home  Items Reviewed:  Medications reviewed: Yes  Allergies reviewed: Yes  Dietary changes reviewed: Yes  Referrals reviewed: Yes-has appt with cardiologist and gastroenterologist.   Functional Questionnaire:   Activities of Daily Living (ADLs):   She states they are independent in the following: ambulation, feeding. States they require assistance with the following: grooming, toileting, bathing, dressing, hygiene-has a nurse come out to help with these things twice a week, incontinence issues are present.   Any transportation issues/concerns?: No   Any patient concerns? Not at this time, besides her back hurting from laying in the hospital bed for so long and not able to move around a lot.    Confirmed importance and date/time of follow-up visits scheduled Yes  Provider Appointment booked with Alma Friendly, NP on 03/15/2018.  Confirmed with patient if condition begins to worsen call PCP or go to the ER.  Patient was given the office number and encouraged to call back with question or concerns.  : Yes

## 2018-03-13 NOTE — Telephone Encounter (Signed)
Noted. Not sure if they are requesting a call back.

## 2018-03-13 NOTE — Telephone Encounter (Signed)
[facility] called office stating they are going to resume care for home health services today. The best cb number is [phone]

## 2018-03-13 NOTE — Telephone Encounter (Signed)
Approved. I bet this note goes along with the prior phone note.

## 2018-03-13 NOTE — Telephone Encounter (Signed)
EMMI Follow-up: Noted on the report that the patient hadn't read her discharge paperwork yet, didn't know who to call if there was a change in her condition and no scheduled follow-up appointment. I left a message with my contact information for Ms. Rizzolo to call me at her convenience.

## 2018-03-15 ENCOUNTER — Ambulatory Visit (INDEPENDENT_AMBULATORY_CARE_PROVIDER_SITE_OTHER): Payer: Medicare Other | Admitting: Primary Care

## 2018-03-15 ENCOUNTER — Other Ambulatory Visit: Payer: Self-pay | Admitting: Primary Care

## 2018-03-15 ENCOUNTER — Encounter: Payer: Self-pay | Admitting: Primary Care

## 2018-03-15 ENCOUNTER — Other Ambulatory Visit: Payer: Medicare Other

## 2018-03-15 ENCOUNTER — Telehealth: Payer: Self-pay | Admitting: Radiology

## 2018-03-15 VITALS — BP 140/68 | HR 65 | Temp 98.0°F | Ht 62.0 in | Wt 131.5 lb

## 2018-03-15 DIAGNOSIS — I10 Essential (primary) hypertension: Secondary | ICD-10-CM

## 2018-03-15 DIAGNOSIS — A0471 Enterocolitis due to Clostridium difficile, recurrent: Secondary | ICD-10-CM | POA: Diagnosis not present

## 2018-03-15 DIAGNOSIS — R600 Localized edema: Secondary | ICD-10-CM

## 2018-03-15 DIAGNOSIS — D509 Iron deficiency anemia, unspecified: Secondary | ICD-10-CM

## 2018-03-15 DIAGNOSIS — I5033 Acute on chronic diastolic (congestive) heart failure: Secondary | ICD-10-CM

## 2018-03-15 DIAGNOSIS — I482 Chronic atrial fibrillation, unspecified: Secondary | ICD-10-CM | POA: Diagnosis not present

## 2018-03-15 DIAGNOSIS — I5032 Chronic diastolic (congestive) heart failure: Secondary | ICD-10-CM

## 2018-03-15 DIAGNOSIS — D72829 Elevated white blood cell count, unspecified: Secondary | ICD-10-CM | POA: Diagnosis not present

## 2018-03-15 DIAGNOSIS — Z09 Encounter for follow-up examination after completed treatment for conditions other than malignant neoplasm: Secondary | ICD-10-CM

## 2018-03-15 LAB — WHITE CELL DIFFERENTIAL
BAND NEUTROPHILS: 0 %
BASOS PCT: 1 % (ref 0.0–3.0)
Eosinophils Relative: 0.9 % (ref 0.0–5.0)
Lymphocytes Relative: 9.9 % — ABNORMAL LOW (ref 12.0–46.0)
Monocytes Relative: 7.8 % (ref 3.0–12.0)
Neutrophils Relative %: 80.4 % — ABNORMAL HIGH (ref 43.0–77.0)

## 2018-03-15 LAB — CBC
HCT: 33.7 % — ABNORMAL LOW (ref 36.0–46.0)
HEMOGLOBIN: 10.9 g/dL — AB (ref 12.0–15.0)
MCHC: 32.4 g/dL (ref 30.0–36.0)
MCV: 84.6 fl (ref 78.0–100.0)
PLATELETS: 477 10*3/uL — AB (ref 150.0–400.0)
RBC: 3.98 Mil/uL (ref 3.87–5.11)
RDW: 18 % — ABNORMAL HIGH (ref 11.5–15.5)

## 2018-03-15 LAB — FERRITIN: Ferritin: 258.2 ng/mL (ref 10.0–291.0)

## 2018-03-15 LAB — IBC PANEL
Iron: <10 ug/dL — ABNORMAL LOW (ref 42–145)
SATURATION RATIOS: 2.6 % — AB (ref 20.0–50.0)
TRANSFERRIN: 274 mg/dL (ref 212.0–360.0)

## 2018-03-15 MED ORDER — VANCOMYCIN HCL 125 MG PO CAPS: 125.0000 mg | ORAL_CAPSULE | Freq: Four times a day (QID) | ORAL | 0 refills | Status: DC

## 2018-03-15 MED ORDER — FIDAXOMICIN 200 MG PO TABS: 200.0000 mg | ORAL_TABLET | Freq: Two times a day (BID) | ORAL | 0 refills | Status: DC

## 2018-03-15 NOTE — Addendum Note (Signed)
Addended by: Ellamae Sia on: 03/15/2018 04:00 PM   Modules accepted: Orders

## 2018-03-15 NOTE — Assessment & Plan Note (Signed)
Recent hospital admission for acute on chronic CHF. Overall doing better, appears euvolemic today. She is compliant to her new medication regimen, is weighing herself daily as instructed.  Home PT will be in the home next week.  Repeat labs pending today.  All hospital notes, labs, imaging reviewed.

## 2018-03-15 NOTE — Assessment & Plan Note (Signed)
1 unit of PRBC transfused during recent hospital stay for drop in hemoglobin to 7.6.  Repeat CBC pending today.  We will likely switch her to ferrous sulfate 325 mg daily.  Will consult with GI.

## 2018-03-15 NOTE — Telephone Encounter (Signed)
[facility] called a critical WBC - 20.3, results given to [facility]

## 2018-03-15 NOTE — Assessment & Plan Note (Signed)
Improved on exam today.  Continue furosemide 40 mg daily.

## 2018-03-15 NOTE — Assessment & Plan Note (Signed)
Diltiazem added to regimen during recent hospital stay for rate control.  Metoprolol succinate continued.  Rate stable today.  Continue current regimen.

## 2018-03-15 NOTE — Assessment & Plan Note (Signed)
Borderline.  Hydralazine held during hospital stay, agree that this should continue to be held for now.  Consider adding ACE/ARB in the setting of congestive heart failure if needed.  Continue beta-blocker and calcium channel blocker.  Recommended she start monitoring blood pressure at home.

## 2018-03-15 NOTE — Telephone Encounter (Signed)
Addressed earlier today during clinic. GI is aware and we decided to treat for suspected C-diff.  Patient has oral vancomycin and will start tonight.

## 2018-03-15 NOTE — Patient Instructions (Signed)
Stop by the lab prior to leaving today. I will notify you of your results once received.   Continue to weigh yourself every morning on the same scale wearing the same clothes. Please call myself or Otila Kluver if you see a weight gain greater than 2 pounds in 24 hours or greater than 5 pounds in one week.  Continue taking furosemide 40 mg daily.  Do not take hydralazine for now. Continue taking diltiazem and metoprolol succinate for blood pressure.   Follow up with the cardiologist, GI doctor, and heart failure clinic as scheduled.  I'll be in touch once I hear back from the GI doctor.  It was a pleasure to see you today!

## 2018-03-15 NOTE — Progress Notes (Signed)
Subjective:    Patient ID: Heather Larson, female    DOB: 1923/12/14, 84 y.o.   MRN: [id number]  HPI  [name] is a 84 year old female who presents today for TCM hospital follow-up.  She presented to [facility] ED on March 04, 2018 with complaints of shortness of breath.  Shortness of breath began several days prior and progressed since.  She also endorsed increased lower extremity edema despite taking Bumex.  During her stay in the ED she underwent ECG which was grossly unchanged from September 2019.  Chest x-ray shows bilateral pleural effusions with cardiomegaly.  Labs include BNP with result of greater than 900.  Also with chronic anemia, hemoglobin of 8.9.  She was treated with IV furosemide and admitted for further evaluation.  During her hospital stay she was officially made DNR.  Diltiazem was added to her regimen for rate control of atrial fibrillation, hydralazine was held.  Echocardiogram with LVEF of 60 to 65% without regional wall motion abnormalities.  Her hemoglobin dropped during her hospital stay down to 7.6, she received one unit of PRBC and an iron infusion during her stay for severe iron deficiency anemia.  Pantoprazole 40 mg was added to her regimen.  She was discharged home on 03/08/18 with recommendations for CHF clinic follow up, home PT. She was sent home with a prescription for daily furosemide 40 mg, Cardizem CD  180 mg.  Metoprolol succinate 25 mg was continued. Goal baseline hemoglobin is above 8.  Since her hospital stay she continues to have difficulty with diarrhea which has waxed and waned over the last 1 week.  She had an episode of diarrhea after her visit today.  She continues to feel fatigued at times.  She is compliant to her Fusion Plus Iron, also taking a separate probiotic (acidophilus). The Fusion Plus Iron is too expensive and her daughter is questioning the need for this type of oral iron.  She purchased scales and started weighing herself in the morning.   Weight of 129 yesterday, 130 today.  She may be starting lymphapress compression boots for edema. Home health physical therapy will be coming out next week.   She denies fevers, chills, increased lower extremity edema, shortness of breath, cough.  She has a follow-up appointment scheduled with GI and cardiology.  Wt Readings from Last 3 Encounters:  03/15/18 131 lb 8 oz (59.6 kg)  03/13/18 133 lb 2 oz (60.4 kg)  03/08/18 142 lb 11.2 oz (64.7 kg)   BP Readings from Last 3 Encounters:  03/15/18 140/68  03/13/18 (!) 155/66  03/08/18 124/85     Review of Systems  Constitutional: Positive for fatigue. Negative for fever.  Respiratory: Negative for cough and shortness of breath.   Cardiovascular: Negative for chest pain.       Denies increased lower extremity edema  Gastrointestinal: Positive for diarrhea. Negative for abdominal pain.  Skin: Negative for color change.  Neurological: Negative for dizziness.       Past Medical History:  Diagnosis Date  . Anemia   . Atrial fibrillation, permanent    on Xarelto  . Bilateral lower extremity edema   . Bilateral lower leg cellulitis 11/11/2017  . Cancer [facility])    breast cancer at age 17 and skin  . Cellulitis    lower abdomen  . CHF (congestive heart failure) ([place])   . Diarrhea 01/11/2018  . Hyperlipidemia   . Hypertension   . Hypertension   . Incontinence   . Low  sodium levels   . Macular degeneration   . Malnutrition of moderate degree 11/23/2017     Social History   Socioeconomic History  . Marital status: Divorced    Spouse name: Not on file  . Number of children: Not on file  . Years of education: Not on file  . Highest education level: Not on file  Occupational History  . Not on file  Social Needs  . Financial resource strain: Not on file  . Food insecurity:    Worry: Not on file    Inability: Not on file  . Transportation needs:    Medical: Not on file    Non-medical: Not on file  Tobacco Use  . Smoking  status: Never Smoker  . Smokeless tobacco: Never Used  Substance and Sexual Activity  . Alcohol use: Not Currently    Alcohol/week: 2.0 standard drinks    Types: 2 Shots of liquor per week    Comment: 2 drinks daily  . Drug use: No  . Sexual activity: Not on file  Lifestyle  . Physical activity:    Days per week: Not on file    Minutes per session: Not on file  . Stress: Not on file  Relationships  . Social connections:    Talks on phone: Not on file    Gets together: Not on file    Attends religious service: Not on file    Active member of club or organization: Not on file    Attends meetings of clubs or organizations: Not on file    Relationship status: Not on file  . Intimate partner violence:    Fear of current or ex partner: Not on file    Emotionally abused: Not on file    Physically abused: Not on file    Forced sexual activity: Not on file  Other Topics Concern  . Not on file  Social History Narrative   Moved from [place].    Past Surgical History:  Procedure Laterality Date  . BREAST SURGERY     right mastectomy  . EYE SURGERY     x3  . IR RADIOLOGIST EVAL & MGMT  09/17/2017  . MASTECTOMY     right  . SHOULDER OPEN ROTATOR CUFF REPAIR     left  . SKIN BIOPSY     skin cancer    No family history on file.  No Known Allergies  Current Outpatient Medications on File Prior to Visit  Medication Sig Dispense Refill  . acidophilus (RISAQUAD) CAPS capsule Take 2 capsules by mouth 3 (three) times daily. 50 capsule 0  . albuterol (PROVENTIL HFA;VENTOLIN HFA) 108 (90 Base) MCG/ACT inhaler Inhale 2 puffs into the lungs every 6 (six) hours as needed for wheezing or shortness of breath.    Marland Kitchen atorvastatin (LIPITOR) 10 MG tablet Take 1 tablet (10 mg total) by mouth daily. 90 tablet 3  . brimonidine (ALPHAGAN) 0.2 % ophthalmic solution Place 1 drop into both eyes 3 (three) times daily.  6  . calcium carbonate (OSCAL) 1500 (600 Ca) MG TABS tablet Take by mouth 2 (two)  times daily with a meal.    . diltiazem (CARDIZEM CD) 180 MG 24 hr capsule Take 1 capsule (180 mg total) by mouth daily. 30 capsule 2  . dorzolamide (TRUSOPT) 2 % ophthalmic solution Place 1 drop into both eyes 3 (three) times daily.  6  . furosemide (LASIX) 40 MG tablet Take 1 tablet (40 mg total) by mouth daily. 30 tablet  2  . Iron-FA-B Cmp-C-Biot-Probiotic (FUSION PLUS) CAPS Take 130 mg by mouth daily. 90 capsule 0  . metoprolol succinate (TOPROL-XL) 25 MG 24 hr tablet Take 3 tablets (75 mg total) by mouth daily. 270 tablet 0  . Multiple Vitamins-Minerals (MULTIVITAMIN ADULT) TABS Take 1 tablet by mouth daily.    . Nutritional Supplements (NUTRITIONAL SHAKE PLUS PROTEIN PO) Take by mouth.    . pantoprazole (PROTONIX) 40 MG tablet Take 1 tablet (40 mg total) by mouth daily. 30 tablet 2  . Rivaroxaban (XARELTO) 15 MG TABS tablet Take 1 tablet (15 mg total) by mouth daily. 90 tablet 3  . TRAVATAN Z 0.004 % SOLN ophthalmic solution Place 1 drop into both eyes at bedtime.   5  . vitamin C (ASCORBIC ACID) 500 MG tablet Take 500 mg by mouth daily.     No current facility-administered medications on file prior to visit.     BP 140/68   Pulse 65   Temp 98 F (36.7 C) (Oral)   Ht 5\' 2"  (1.575 m)   Wt 131 lb 8 oz (59.6 kg)   SpO2 98%   BMI 24.05 kg/m    Objective:   Physical Exam  Constitutional: She appears well-nourished.  Neck: Neck supple.  Cardiovascular: Normal rate. An irregularly irregular rhythm present.  Mild lower extremity edema, no pitting.  Appears improved from prior visit.  Respiratory: Effort normal. She has no wheezes. She has no rales.  No crackles  Skin: Skin is warm and dry.  Psychiatric: She has a normal mood and affect.           Assessment & Plan:

## 2018-03-15 NOTE — Assessment & Plan Note (Signed)
Overall doing better on daily furosemide 40 mg.  Repeat BMP pending today.  No increased lower extremity edema noted on exam, she actually appears improved compared to prior visits.  Encouraged her to weigh herself daily, record readings and report weight gain of greater than 2 pounds within 24 hours and/or gain of greater than 5 pounds in 1 week.  She and her daughter verbalized understanding.  Continue with heart failure clinic follow-up.

## 2018-03-15 NOTE — Assessment & Plan Note (Signed)
Intermittent diarrhea over the last week.  No IV antibiotics provided during hospital stay.  Follow-up with GI next week as scheduled.  Strict return precautions provided.

## 2018-03-16 DIAGNOSIS — I482 Chronic atrial fibrillation: Secondary | ICD-10-CM | POA: Diagnosis not present

## 2018-03-16 DIAGNOSIS — E785 Hyperlipidemia, unspecified: Secondary | ICD-10-CM | POA: Diagnosis not present

## 2018-03-16 DIAGNOSIS — E440 Moderate protein-calorie malnutrition: Secondary | ICD-10-CM | POA: Diagnosis not present

## 2018-03-16 DIAGNOSIS — F419 Anxiety disorder, unspecified: Secondary | ICD-10-CM | POA: Diagnosis not present

## 2018-03-16 DIAGNOSIS — I10 Essential (primary) hypertension: Secondary | ICD-10-CM | POA: Diagnosis not present

## 2018-03-16 DIAGNOSIS — H3530 Unspecified macular degeneration: Secondary | ICD-10-CM | POA: Diagnosis not present

## 2018-03-17 NOTE — Telephone Encounter (Signed)
Received critical call that C diff test returned positive.  Reviewing chart, it looks like pt has been started on vancomycin QID. Will forward to PCP as [facility]

## 2018-03-18 ENCOUNTER — Telehealth: Payer: Self-pay

## 2018-03-18 ENCOUNTER — Telehealth: Payer: Self-pay | Admitting: Primary Care

## 2018-03-18 NOTE — Telephone Encounter (Signed)
Estill Bamberg with [facility] for nursing care   2x for 1 week 1x for 2 week  This is for checking on patient due to C-diff

## 2018-03-18 NOTE — Telephone Encounter (Signed)
Approved.  

## 2018-03-18 NOTE — Telephone Encounter (Signed)
[facility] calls to report abnormal positive CDifff results on patient to team health over the weekend..  Dr. Danise Mina (on call physician) notified and results communicated with PCP.  Gentry Fitz, NP had already noted and addressed that GI has been treating with Vanc since 11/15.  PCP confirmed this in 03/15/18 telephone message string today with on call MD.  Thanks.

## 2018-03-18 NOTE — Telephone Encounter (Signed)
Noted  

## 2018-03-18 NOTE — Telephone Encounter (Signed)
Yes, patient initiated was initiated on oral vancomycin on 03/15/18.

## 2018-03-19 DIAGNOSIS — E440 Moderate protein-calorie malnutrition: Secondary | ICD-10-CM | POA: Diagnosis not present

## 2018-03-19 DIAGNOSIS — E785 Hyperlipidemia, unspecified: Secondary | ICD-10-CM | POA: Diagnosis not present

## 2018-03-19 DIAGNOSIS — F419 Anxiety disorder, unspecified: Secondary | ICD-10-CM | POA: Diagnosis not present

## 2018-03-19 DIAGNOSIS — I10 Essential (primary) hypertension: Secondary | ICD-10-CM | POA: Diagnosis not present

## 2018-03-19 DIAGNOSIS — I482 Chronic atrial fibrillation: Secondary | ICD-10-CM | POA: Diagnosis not present

## 2018-03-19 DIAGNOSIS — H3530 Unspecified macular degeneration: Secondary | ICD-10-CM | POA: Diagnosis not present

## 2018-03-19 LAB — STOOL CULTURE
MICRO NUMBER:: 91378964
MICRO NUMBER:: 91378966
MICRO NUMBER:: 91378967
SHIGA RESULT:: NOT DETECTED
SPECIMEN QUALITY: ADEQUATE
SPECIMEN QUALITY:: ADEQUATE
SPECIMEN QUALITY:: ADEQUATE

## 2018-03-19 LAB — C. DIFFICILE GDH AND TOXIN A/B
GDH ANTIGEN: DETECTED
MICRO NUMBER:: 91378965
SPECIMEN QUALITY:: ADEQUATE
TOXIN A AND B: DETECTED

## 2018-03-20 DIAGNOSIS — F419 Anxiety disorder, unspecified: Secondary | ICD-10-CM | POA: Diagnosis not present

## 2018-03-20 DIAGNOSIS — I10 Essential (primary) hypertension: Secondary | ICD-10-CM | POA: Diagnosis not present

## 2018-03-20 DIAGNOSIS — E785 Hyperlipidemia, unspecified: Secondary | ICD-10-CM | POA: Diagnosis not present

## 2018-03-20 DIAGNOSIS — H3530 Unspecified macular degeneration: Secondary | ICD-10-CM | POA: Diagnosis not present

## 2018-03-20 DIAGNOSIS — I482 Chronic atrial fibrillation: Secondary | ICD-10-CM | POA: Diagnosis not present

## 2018-03-20 DIAGNOSIS — E440 Moderate protein-calorie malnutrition: Secondary | ICD-10-CM | POA: Diagnosis not present

## 2018-03-20 NOTE — Telephone Encounter (Signed)
Gave the approval for the verbal orders 

## 2018-03-20 NOTE — Telephone Encounter (Signed)
Message left for patient to return my call.  

## 2018-03-25 NOTE — Progress Notes (Signed)
Cardiology Office Note Date:  03/26/2018  Patient ID:  Heather Larson, DOB 03-07-1924, MRN [id number] PCP:  Pleas Koch, NP  Cardiologist:  Dr. Rockey Situ, MD    Chief Complaint: Hospital follow up  History of Present Illness: Heather Larson is a 84 y.o. female with history of permanent Afib on Xarelto, chronic diastolic CHF, lower extremity edema felt to be in the setting of dependent edema exacerbated by diltiazem use, HTN, HLD, chronic hyponatremia, iron deficiency anemia, and breast cancer s/p mastectomy who presents for Afib with RVR, acute on chronic diastolic CHF, and acute on chronic anemia.  Patient was initially diagnosed with Afib in 2014 and has been anticoagulated with Xarelto. Echo from 2014 showed EF 60-65%, biatrial enlargement, aortic valve sclerosis, trivial AI, mild MR and TR. Patient established with Dr. Rockey Situ in 07/2017 for evaluation of Afib and lower extremity swelling. At that time, the patient had stopped Bumex several months prior as she was bothered by frequent voiding and incontinence. She was noted to have been on diltiazem at that time as well. Echo in 07/2017 showed an EF of 55-60%, no RWMA, mild AI, mild to moderate MR, moderately dilated LA, RVSF normal, PASP normal. Given her lower extremity swelling, her diltiazem was stopped and she was placed on metoprolol for rate control.   She was admitted to [facility] 11/5 through 11/8 with increased SOB and leg swelling x 2-3 days. She indicated compliance with medications including diuretic. She was noted to be in Afib with RVR with ventricular rates in the low 100s bpm. CXR showed findings suggestive of interstitial edema and bilateral pleural effusions. BNP 903. Troponin < 0.03. HGB 8.9 with a baseline of ~9.5 and trended down to 7.6 requiring pRBC transfusion and iron infusion. She was rate controlled and diuresed. Echo during this admission showed an EF of 60-65%, no RWMA, mild MR, mildly dilated LA, RVSF was grossly  normal, PASP normal. Discharge SCr of 0.98. Discharge weight of 64.7 kg.   She [facility] up with the [facility] on 11/13 with a BP of 155/66 and weight of 60.4 kg. Noted to be volume up. No changes. She saw PCP on 11/15 with a BP of 140/68 and a weight of 59.6 kg. She noted intermittent diarrhea since her discharge. Stool studies showed the patient was positive for C diff. She was started vancomycin. CBC from 11/15 showed a WBC of 20.3, HGB 10.9. She is scheduled to see GI.   She has been maintained on Lipitor 10 mg, Cardizem CD 180 mg, Lasix 40 mg daily, Toprol XL 75 mg, Xarelto 15 mg along with her non-cardiac medications.   She comes in accompanied by her daughter today. Overall, she is doing well from a cardiac perspective. She denies any palpitations or worsening SOB. She has noted a return of her lower extremity swelling since being placed back on diltiazem in the hospital. She has not fallen since her discharge and denies any BRBPR or melena. She has long standing injection of the bilateral sclera. Weights have remained stable at home in the 131-132 pound range. No chest pain, palpitations, dizziness, presyncope, or syncope. No orthopnea or early satiety. She is scheduled to see GI on 12/3 for anemia and C diff infection.    Past Medical History:  Diagnosis Date  . Anemia   . Atrial fibrillation, permanent    on Xarelto  . Bilateral lower extremity edema   . Bilateral lower leg cellulitis 11/11/2017  . Cancer [facility])    breast  cancer at age 53 and skin  . Cellulitis    lower abdomen  . CHF (congestive heart failure) ([place])   . Diarrhea 01/11/2018  . Hyperlipidemia   . Hypertension   . Hypertension   . Incontinence   . Low sodium levels   . Macular degeneration   . Malnutrition of moderate degree 11/23/2017    Past Surgical History:  Procedure Laterality Date  . BREAST SURGERY     right mastectomy  . EYE SURGERY     x3  . IR RADIOLOGIST EVAL & MGMT  09/17/2017  . MASTECTOMY      right  . SHOULDER OPEN ROTATOR CUFF REPAIR     left  . SKIN BIOPSY     skin cancer    Current Meds  Medication Sig  . acidophilus (RISAQUAD) CAPS capsule Take 2 capsules by mouth 3 (three) times daily.  Marland Kitchen albuterol (PROVENTIL HFA;VENTOLIN HFA) 108 (90 Base) MCG/ACT inhaler Inhale 2 puffs into the lungs every 6 (six) hours as needed for wheezing or shortness of breath.  Marland Kitchen atorvastatin (LIPITOR) 10 MG tablet Take 1 tablet (10 mg total) by mouth daily.  . brimonidine (ALPHAGAN) 0.2 % ophthalmic solution Place 1 drop into both eyes 3 (three) times daily.  . calcium carbonate (OSCAL) 1500 (600 Ca) MG TABS tablet Take by mouth 2 (two) times daily with a meal.  . diltiazem (CARDIZEM CD) 180 MG 24 hr capsule Take 1 capsule (180 mg total) by mouth daily.  . dorzolamide (TRUSOPT) 2 % ophthalmic solution Place 1 drop into both eyes 3 (three) times daily.  . furosemide (LASIX) 40 MG tablet Take 1 tablet (40 mg total) by mouth daily.  . metoprolol succinate (TOPROL-XL) 25 MG 24 hr tablet Take 3 tablets (75 mg total) by mouth daily.  . Multiple Vitamins-Minerals (MULTIVITAMIN ADULT) TABS Take 1 tablet by mouth daily.  . Nutritional Supplements (NUTRITIONAL SHAKE PLUS PROTEIN PO) Take by mouth.  . pantoprazole (PROTONIX) 40 MG tablet Take 1 tablet (40 mg total) by mouth daily.  . Rivaroxaban (XARELTO) 15 MG TABS tablet Take 1 tablet (15 mg total) by mouth daily.  . TRAVATAN Z 0.004 % SOLN ophthalmic solution Place 1 drop into both eyes at bedtime.   . vancomycin (VANCOCIN) 125 MG capsule Take 1 capsule (125 mg total) by mouth 4 (four) times daily.  . vitamin C (ASCORBIC ACID) 500 MG tablet Take 500 mg by mouth daily.    Allergies:   Patient has no known allergies.   Social History:  The patient  reports that she has never smoked. She has never used smokeless tobacco. She reports that she drank about 2.0 standard drinks of alcohol per week. She reports that she does not use drugs.   Family History:   The patient's family history is not on file.  ROS:   Review of Systems  Constitutional: Positive for malaise/fatigue. Negative for chills, diaphoresis, fever and weight loss.  HENT: Negative for congestion.   Eyes: Positive for redness. Negative for discharge.  Respiratory: Positive for shortness of breath. Negative for cough, hemoptysis, sputum production and wheezing.   Cardiovascular: Positive for leg swelling. Negative for chest pain, palpitations, orthopnea, claudication and PND.  Gastrointestinal: Positive for diarrhea. Negative for abdominal pain, blood in stool, constipation, heartburn, melena, nausea and vomiting.  Genitourinary: Negative for hematuria.  Musculoskeletal: Negative for falls and myalgias.  Skin: Negative for rash.  Neurological: Positive for weakness. Negative for dizziness, tingling, tremors, sensory change, speech change, focal weakness  and loss of consciousness.  Endo/Heme/Allergies: Does not bruise/bleed easily.  Psychiatric/Behavioral: Negative for substance abuse. The patient is not nervous/anxious.   All other systems reviewed and are negative.    PHYSICAL EXAM:  VS:  BP (!) 128/58 (BP Location: Left Arm, Patient Position: Sitting, Cuff Size: Normal)   Pulse 68   Ht 5\' 1"  (1.549 m)   Wt 134 lb (60.8 kg)   BMI 25.32 kg/m  BMI: Body mass index is 25.32 kg/m.  Physical Exam  Constitutional: She is oriented to person, place, and time. She appears well-developed and well-nourished.  Kyphosis of the thoracic spine noted  HENT:  Head: Normocephalic and atraumatic.  Eyes: Right eye exhibits no discharge. Left eye exhibits no discharge.  Bilateral scleral injection (long standing issue)  Neck: Normal range of motion. No JVD present.  Cardiovascular: Normal rate, S1 normal and S2 normal. An irregularly irregular rhythm present. Exam reveals no distant heart sounds, no friction rub, no midsystolic click and no opening snap.  Murmur heard. High-pitched blowing  holosystolic murmur is present with a grade of 2/6 at the apex. Pulses:      Posterior tibial pulses are 2+ on the right side, and 2+ on the left side.  Pulmonary/Chest: Effort normal and breath sounds normal. No respiratory distress. She has no decreased breath sounds. She has no wheezes. She has no rales. She exhibits no tenderness.  Abdominal: Soft. She exhibits no distension. There is no tenderness.  Musculoskeletal: She exhibits edema.  2+ bilateral lower extremity edema to the knees  Neurological: She is alert and oriented to person, place, and time.  Skin: Skin is warm and dry. No cyanosis. Nails show no clubbing.  Psychiatric: She has a normal mood and affect. Her speech is normal and behavior is normal. Judgment and thought content normal.     EKG:  Was ordered and interpreted by me today. Shows Afib, 68 bpm, underlying artifact, no acute st/t changes  Recent Labs: 07/31/2017: Pro B Natriuretic peptide (BNP) 467.0 11/24/2017: Magnesium 1.8 03/04/2018: ALT 18; B Natriuretic Peptide 903.0; TSH 2.036 03/08/2018: BUN 38; Creatinine, Ser 0.98; Potassium 4.0; Sodium 129 03/15/2018: Hemoglobin 10.9; Platelets 477.0  07/31/2017: Cholesterol 141; HDL 74.00; LDL Cholesterol 56; Total CHOL/HDL Ratio 2; Triglycerides 53.0; VLDL 10.6   Estimated Creatinine Clearance: 30.6 mL/min (by C-G formula based on SCr of 0.98 mg/dL).   Wt Readings from Last 3 Encounters:  03/26/18 134 lb (60.8 kg)  03/15/18 131 lb 8 oz (59.6 kg)  03/13/18 133 lb 2 oz (60.4 kg)     Other studies reviewed: Additional studies/records reviewed today include: summarized above  ASSESSMENT AND PLAN:  1. Permanent Afib: Ventricular rates are well controlled in the 60s bpm. Stop diltiazem as below given this is exacerbating her dependent edema. Would not use diltiazem in this patient unless absolutely needed for rate control. Titrate Toprol XL to 100 mg daily. Continue Xarelto 15 mg daily given her CrCl is less than 50 mL/min  based on most recent labs.   2. Chronic diastolic CHF: Likely exacerbated by her worsening anemia and Afib with RVR. She does have lower extremity swelling on exam today, though she has a history of dependent edema and documented intolerance to diltiazem with lower extremity swelling. She is not grossly volume overloaded at this time. Check bmet. For now, continue Lasix. CHF education.   3. Lower extremity swelling: Exacerbated by diltiazem, which has been stopped as above. Elevate legs. Consider compression stockings.   4. Iron deficiency  anemia: HGB was 11.9 at first check in the [facility] system in 03/2017. Since then, she has continued to have drops in her HGB, ultimately to a low of 7.6, which required pRBC. HGB at last check with PCP was improved to 10.9. Check CBC today. If HGB continues to drop we may ultimately need to hold Xarelto while etiology is evaluated. Her worsening anemia is playing a role in her lower extremity swelling through third spacing as well has her dyspnea and Afib with RVR. Defer work up to PCP.   5. HTN: Blood pressure is well controlled today. Continue current medications as above.   6. HLD: LDL of 56 from 07/2017. Remains on Lipitor.   7. Chronic hyponatremia: Per PCP. May need to back off on her Lasix.   Disposition: F/u with Dr. Rockey Situ or an APP in 1 month.   Current medicines are reviewed at length with the patient today.  The patient did not have any concerns regarding medicines.  Signed, Christell Faith, PA-C 03/26/2018 3:12 PM     [address], [place] [address] [phone]

## 2018-03-26 ENCOUNTER — Ambulatory Visit (INDEPENDENT_AMBULATORY_CARE_PROVIDER_SITE_OTHER): Payer: Medicare Other | Admitting: Physician Assistant

## 2018-03-26 ENCOUNTER — Encounter: Payer: Self-pay | Admitting: Physician Assistant

## 2018-03-26 VITALS — BP 128/58 | HR 68 | Ht 61.0 in | Wt 134.0 lb

## 2018-03-26 DIAGNOSIS — I482 Chronic atrial fibrillation, unspecified: Secondary | ICD-10-CM

## 2018-03-26 DIAGNOSIS — D509 Iron deficiency anemia, unspecified: Secondary | ICD-10-CM | POA: Diagnosis not present

## 2018-03-26 DIAGNOSIS — D649 Anemia, unspecified: Secondary | ICD-10-CM | POA: Diagnosis not present

## 2018-03-26 DIAGNOSIS — I5032 Chronic diastolic (congestive) heart failure: Secondary | ICD-10-CM | POA: Diagnosis not present

## 2018-03-26 DIAGNOSIS — I10 Essential (primary) hypertension: Secondary | ICD-10-CM

## 2018-03-26 DIAGNOSIS — E782 Mixed hyperlipidemia: Secondary | ICD-10-CM

## 2018-03-26 DIAGNOSIS — R600 Localized edema: Secondary | ICD-10-CM | POA: Diagnosis not present

## 2018-03-26 DIAGNOSIS — E440 Moderate protein-calorie malnutrition: Secondary | ICD-10-CM | POA: Diagnosis not present

## 2018-03-26 DIAGNOSIS — F419 Anxiety disorder, unspecified: Secondary | ICD-10-CM | POA: Diagnosis not present

## 2018-03-26 DIAGNOSIS — H3530 Unspecified macular degeneration: Secondary | ICD-10-CM | POA: Diagnosis not present

## 2018-03-26 DIAGNOSIS — E871 Hypo-osmolality and hyponatremia: Secondary | ICD-10-CM

## 2018-03-26 DIAGNOSIS — E785 Hyperlipidemia, unspecified: Secondary | ICD-10-CM | POA: Diagnosis not present

## 2018-03-26 MED ORDER — METOPROLOL SUCCINATE ER 100 MG PO TB24: 100.0000 mg | ORAL_TABLET | Freq: Every day | ORAL | 3 refills | Status: DC

## 2018-03-26 NOTE — Patient Instructions (Signed)
Medication Instructions:  - Your physician has recommended you make the following change in your medication:   1) STOP cardizem (diltiazem) 2) INCREASE toprol (metoprolol succinate) to 100 mg- take 1 tablet by mouth once daily   If you need a refill on your cardiac medications before your next appointment, please call your pharmacy.   Lab work: - Your physician recommends that you have lab work today: BMP/ CBC  If you have labs (blood work) drawn today and your tests are completely normal, you will receive your results only by: Marland Kitchen MyChart Message (if you have MyChart) OR . A paper copy in the mail If you have any lab test that is abnormal or we need to change your treatment, we will call you to review the results.  Testing/Procedures: - none ordered  Follow-Up: At [facility], you and your health needs are our priority.  As part of our continuing mission to provide you with exceptional heart care, we have created designated Provider Care Teams.  These Care Teams include your primary Cardiologist (physician) and Advanced Practice Providers (APPs -  Physician Assistants and Nurse Practitioners) who all work together to provide you with the care you need, when you need it. . 1 month with Dr. Dayton Martes, PA  Any Other Special Instructions Will Be Listed Below (If Applicable). - N/A

## 2018-03-27 ENCOUNTER — Telehealth: Payer: Self-pay

## 2018-03-27 ENCOUNTER — Other Ambulatory Visit
Admission: RE | Admit: 2018-03-27 | Discharge: 2018-03-27 | Disposition: A | Discharge status: Home / Self Care | Payer: Medicare Other | Source: Ambulatory Visit | Attending: Physician Assistant | Admitting: Physician Assistant

## 2018-03-27 DIAGNOSIS — H3530 Unspecified macular degeneration: Secondary | ICD-10-CM | POA: Diagnosis not present

## 2018-03-27 DIAGNOSIS — E440 Moderate protein-calorie malnutrition: Secondary | ICD-10-CM | POA: Diagnosis not present

## 2018-03-27 DIAGNOSIS — I10 Essential (primary) hypertension: Secondary | ICD-10-CM | POA: Diagnosis not present

## 2018-03-27 DIAGNOSIS — I482 Chronic atrial fibrillation, unspecified: Secondary | ICD-10-CM | POA: Insufficient documentation

## 2018-03-27 DIAGNOSIS — F419 Anxiety disorder, unspecified: Secondary | ICD-10-CM | POA: Diagnosis not present

## 2018-03-27 DIAGNOSIS — E785 Hyperlipidemia, unspecified: Secondary | ICD-10-CM | POA: Diagnosis not present

## 2018-03-27 LAB — BASIC METABOLIC PANEL
BUN / CREAT RATIO: 23 (ref 12–28)
BUN: 18 mg/dL (ref 10–36)
CHLORIDE: 95 mmol/L — AB (ref 96–106)
CO2: 21 mmol/L (ref 20–29)
Calcium: 9.4 mg/dL (ref 8.7–10.3)
Creatinine, Ser: 0.79 mg/dL (ref 0.57–1.00)
GFR calc non Af Amer: 65 mL/min/{1.73_m2} (ref >59)
GFR, EST AFRICAN AMERICAN: 75 mL/min/{1.73_m2} (ref >59)
GLUCOSE: 103 mg/dL — AB (ref 65–99)
POTASSIUM: 5.7 mmol/L — AB (ref 3.5–5.2)
Sodium: 134 mmol/L (ref 134–144)

## 2018-03-27 LAB — CBC WITH DIFFERENTIAL/PLATELET
BASOS ABS: 0.1 10*3/uL (ref 0.0–0.2)
Basos: 1 %
EOS (ABSOLUTE): 0.3 10*3/uL (ref 0.0–0.4)
Eos: 4 %
Hematocrit: 32.2 % — ABNORMAL LOW (ref 34.0–46.6)
Hemoglobin: 10.8 g/dL — ABNORMAL LOW (ref 11.1–15.9)
Immature Grans (Abs): 0 10*3/uL (ref 0.0–0.1)
Immature Granulocytes: 0 %
LYMPHS ABS: 1.8 10*3/uL (ref 0.7–3.1)
Lymphs: 19 %
MCH: 27.7 pg (ref 26.6–33.0)
MCHC: 33.5 g/dL (ref 31.5–35.7)
MCV: 83 fL (ref 79–97)
Monocytes Absolute: 1 10*3/uL — ABNORMAL HIGH (ref 0.1–0.9)
Monocytes: 10 %
NEUTROS ABS: 6.4 10*3/uL (ref 1.4–7.0)
Neutrophils: 66 %
PLATELETS: 431 10*3/uL (ref 150–450)
RBC: 3.9 x10E6/uL (ref 3.77–5.28)
RDW: 15 % (ref 12.3–15.4)
WBC: 9.6 10*3/uL (ref 3.4–10.8)

## 2018-03-27 LAB — POTASSIUM: Potassium: 4.2 mmol/L (ref 3.5–5.1)

## 2018-03-27 NOTE — Telephone Encounter (Signed)
-----   Message from Rise Mu, PA-C sent at 03/27/2018  7:01 AM EST ----- Random glucose ok.  Renal function normal.  Potassium is elevated (specimen was hemolyzed which can lead to falsely elevated potassium levels).  WBC is improved and now normal.  HGB remains low, though stable at 10.8.   Please have the patient go to the medical mall this morning for a stat potassium level.  No medications to hold at this time.

## 2018-03-27 NOTE — Telephone Encounter (Signed)
Returned call to daughter to let her know that potassium redraw was normal. Advised pt to call for any further questions or concerns

## 2018-03-27 NOTE — Telephone Encounter (Signed)
Pt daughter is returning your call. 

## 2018-03-27 NOTE — Telephone Encounter (Signed)
Pt daughter called for lab results that were taken 03/26/18. Potassium elevated per result but lab note reported "possible hemolyzation".   Stat lab order placed and daughter notified to report to medical mall for phlebotomy asap.  She expressed understanding.

## 2018-03-27 NOTE — Telephone Encounter (Signed)
Random glucose ok.  Renal function normal.  Potassium is elevated (specimen was hemolyzed which can lead to falsely elevated potassium levels).  WBC is improved and now normal.  HGB remains low, though stable at 10.8.   Please have the patient go to the medical mall this morning for a stat potassium level.  No medications to hold at this time.

## 2018-03-29 DIAGNOSIS — I4821 Permanent atrial fibrillation: Secondary | ICD-10-CM | POA: Diagnosis not present

## 2018-03-29 DIAGNOSIS — H3530 Unspecified macular degeneration: Secondary | ICD-10-CM | POA: Diagnosis not present

## 2018-03-29 DIAGNOSIS — I5033 Acute on chronic diastolic (congestive) heart failure: Secondary | ICD-10-CM | POA: Diagnosis not present

## 2018-03-29 DIAGNOSIS — E785 Hyperlipidemia, unspecified: Secondary | ICD-10-CM | POA: Diagnosis not present

## 2018-03-29 DIAGNOSIS — Z853 Personal history of malignant neoplasm of breast: Secondary | ICD-10-CM | POA: Diagnosis not present

## 2018-03-29 DIAGNOSIS — I110 Hypertensive heart disease with heart failure: Secondary | ICD-10-CM | POA: Diagnosis not present

## 2018-03-29 DIAGNOSIS — D509 Iron deficiency anemia, unspecified: Secondary | ICD-10-CM | POA: Diagnosis not present

## 2018-03-29 DIAGNOSIS — E440 Moderate protein-calorie malnutrition: Secondary | ICD-10-CM | POA: Diagnosis not present

## 2018-03-29 DIAGNOSIS — Z9181 History of falling: Secondary | ICD-10-CM | POA: Diagnosis not present

## 2018-03-29 DIAGNOSIS — F419 Anxiety disorder, unspecified: Secondary | ICD-10-CM | POA: Diagnosis not present

## 2018-03-29 DIAGNOSIS — Z85828 Personal history of other malignant neoplasm of skin: Secondary | ICD-10-CM | POA: Diagnosis not present

## 2018-03-29 DIAGNOSIS — Z7901 Long term (current) use of anticoagulants: Secondary | ICD-10-CM | POA: Diagnosis not present

## 2018-03-29 DIAGNOSIS — Z9011 Acquired absence of right breast and nipple: Secondary | ICD-10-CM | POA: Diagnosis not present

## 2018-04-01 DIAGNOSIS — H3530 Unspecified macular degeneration: Secondary | ICD-10-CM | POA: Diagnosis not present

## 2018-04-01 DIAGNOSIS — I110 Hypertensive heart disease with heart failure: Secondary | ICD-10-CM | POA: Diagnosis not present

## 2018-04-01 DIAGNOSIS — I5033 Acute on chronic diastolic (congestive) heart failure: Secondary | ICD-10-CM | POA: Diagnosis not present

## 2018-04-01 DIAGNOSIS — I4821 Permanent atrial fibrillation: Secondary | ICD-10-CM | POA: Diagnosis not present

## 2018-04-01 DIAGNOSIS — D509 Iron deficiency anemia, unspecified: Secondary | ICD-10-CM | POA: Diagnosis not present

## 2018-04-01 DIAGNOSIS — E785 Hyperlipidemia, unspecified: Secondary | ICD-10-CM | POA: Diagnosis not present

## 2018-04-02 ENCOUNTER — Ambulatory Visit (INDEPENDENT_AMBULATORY_CARE_PROVIDER_SITE_OTHER): Payer: Medicare Other | Admitting: Gastroenterology

## 2018-04-02 ENCOUNTER — Encounter: Payer: Self-pay | Admitting: Gastroenterology

## 2018-04-02 VITALS — BP 165/81 | HR 99 | Resp 17 | Ht 61.0 in | Wt 135.2 lb

## 2018-04-02 DIAGNOSIS — A0471 Enterocolitis due to Clostridium difficile, recurrent: Secondary | ICD-10-CM

## 2018-04-02 NOTE — Progress Notes (Signed)
Please had a first-time involving his right  Heather Darby, MD [address]  [place]  [place], [place] [address]  Main: [phone]  Fax: [phone]    Gastroenterology Consultation.  Referring Provider:     Pleas Koch, NP Primary Care Physician:  Pleas Koch, NP Primary Gastroenterologist:  Dr. 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 female s seen as a hospital follow-up. Patient was recently discharged from [facility] after being treated for bilateral lower extremity cellulitis, treated with antibiotics and later developed C. Difficile colitis. She was treated with oral vancomycin and IV metronidazole. Discharged home on 2 weeks course of oral vancomycin.   Interval summary: Patient is accompanied by her daughter today who is very involved in her care. Patient's diarrhea has resolved. Currently having one to 2 formed bowel movements daily and sometimes experiences gurgling in her stomach.overall, her energy levels are significantly better, able to socialize with her friends and more active. She started taking iron every other day as recommended by her primary care provider because of microcytic anemia. Patient's hemoglobin was 11.9 in 03/2017 with normal MCV. And her hemoglobin has declined in last 1 month when she was admitted to [facility] with cellulitis and C. Difficile. Her hemoglobin nadir was 8.2, MCV 77 and reactive thrombocytosis. Patient denies having black tarry stools or rectal bleeding. She did have some bloody diarrhea and she has C. Difficile colitis. Her hemoglobin after discharge is 9.3 on 12/05/2017.  Follow-up visit in 01/29/2018 She was readmitted to [facility] about 2 weeks ago secondary to recurrence of C. Difficile.  Her WBC count at that time was 43,000, she was started on oral vancomycin  125 MCG 4 times daily.  During the hospital stay, her WBC count improved to 13,000.  She was discharged home on oral vancomycin, her stools are more formed at the time of discharge.  Her diarrhea has resolved at this time.  She continues to take oral vancomycin 4 times daily which is her week 3.  She reports abdominal bloating.  She is taking acidophilus along with antibiotics.  She is also taking oral iron once a day.  She reports that she feels well otherwise  Follow-up visit 02/21/2018 Patient denies any complaints of diarrhea.  She has abdominal bloating, sometimes her stools are soft, on average 2 times daily.  Nonbloody, appetite is intact.  She is taking oral iron daily.  Currently, on vancomycin taper 1 pill 2-3 times every week.  Patient is accompanied by her daughter today.  Recent blood work shows that her hemoglobin is improving  Follow-up visit 04/02/18 Patient had second recurrence of C. Difficile, confirmed on 03/15/2018.  She had elevated leukocytosis, 20.3K.  She was started on oral vancomycin 125mg  4 times daily for 2 weeks.  Patient finished the course on Friday last week. Leukocytosis resolved on repeat labs on 03/26/2018 at cardiologist's office.  Currently, she reports having 1-2 semi-formed, nonbloody bowel movements daily.  She does have abdominal bloating.  She is taking probiotics as well as oral iron 1 pill daily.  Her hemoglobin has been stable.  Patient is accompanied by her daughter today.  NSAIDs: none  Antiplts/Anticoagulants/Anti thrombotics: none  GI Procedures: none  Past Medical History:  Diagnosis Date  . Anemia   . Atrial fibrillation, permanent    on  Xarelto  . Bilateral lower extremity edema   . Bilateral lower leg cellulitis 11/11/2017  . Cancer [facility]    breast cancer at age 22 and skin  . Cellulitis    lower abdomen  . CHF (congestive heart failure) ([place])   . Diarrhea 01/11/2018  . Hyperlipidemia   . Hypertension   . Hypertension   . Incontinence     . Low sodium levels   . Macular degeneration   . Malnutrition of moderate degree 11/23/2017    Past Surgical History:  Procedure Laterality Date  . BREAST SURGERY     right mastectomy  . EYE SURGERY     x3  . IR RADIOLOGIST EVAL & MGMT  09/17/2017  . MASTECTOMY     right  . SHOULDER OPEN ROTATOR CUFF REPAIR     left  . SKIN BIOPSY     skin cancer    Current Outpatient Medications:  .  acidophilus (RISAQUAD) CAPS capsule, Take 2 capsules by mouth 3 (three) times daily., Disp: 50 capsule, Rfl: 0 .  albuterol (PROVENTIL HFA;VENTOLIN HFA) 108 (90 Base) MCG/ACT inhaler, Inhale 2 puffs into the lungs every 6 (six) hours as needed for wheezing or shortness of breath., Disp: , Rfl:  .  atorvastatin (LIPITOR) 10 MG tablet, Take 1 tablet (10 mg total) by mouth daily., Disp: 90 tablet, Rfl: 3 .  brimonidine (ALPHAGAN) 0.2 % ophthalmic solution, Place 1 drop into both eyes 3 (three) times daily., Disp: , Rfl: 6 .  calcium carbonate (OSCAL) 1500 (600 Ca) MG TABS tablet, Take by mouth 2 (two) times daily with a meal., Disp: , Rfl:  .  dorzolamide (TRUSOPT) 2 % ophthalmic solution, Place 1 drop into both eyes 3 (three) times daily., Disp: , Rfl: 6 .  furosemide (LASIX) 40 MG tablet, Take 1 tablet (40 mg total) by mouth daily., Disp: 30 tablet, Rfl: 2 .  metoprolol succinate (TOPROL-XL) 100 MG 24 hr tablet, Take 1 tablet (100 mg total) by mouth daily. Take with or immediately following a meal., Disp: 90 tablet, Rfl: 3 .  Multiple Vitamins-Minerals (MULTIVITAMIN ADULT) TABS, Take 1 tablet by mouth daily., Disp: , Rfl:  .  Nutritional Supplements (NUTRITIONAL SHAKE PLUS PROTEIN PO), Take by mouth., Disp: , Rfl:  .  pantoprazole (PROTONIX) 40 MG tablet, Take 1 tablet (40 mg total) by mouth daily., Disp: 30 tablet, Rfl: 2 .  Rivaroxaban (XARELTO) 15 MG TABS tablet, Take 1 tablet (15 mg total) by mouth daily., Disp: 90 tablet, Rfl: 3 .  TRAVATAN Z 0.004 % SOLN ophthalmic solution, Place 1 drop into both  eyes at bedtime. , Disp: , Rfl: 5 .  vitamin C (ASCORBIC ACID) 500 MG tablet, Take 500 mg by mouth daily., Disp: , Rfl:  .  vancomycin (VANCOCIN) 125 MG capsule, Take 1 capsule (125 mg total) by mouth 4 (four) times daily. (Patient not taking: Reported on 04/02/2018), Disp: 56 capsule, Rfl: 0  No family history on file.   Social History   Tobacco Use  . Smoking status: Never Smoker  . Smokeless tobacco: Never Used  Substance Use Topics  . Alcohol use: Not Currently    Alcohol/week: 2.0 standard drinks    Types: 2 Shots of liquor per week    Comment: 2 drinks daily  . Drug use: No    Allergies as of 04/02/2018  . (No Known Allergies)    Review of Systems:    All systems reviewed and negative except where noted in HPI.  Physical Exam:  BP (!) 165/81 (BP Location: Left Arm, Patient Position: Sitting, Cuff Size: Normal)   Pulse 99   Resp 17   Ht 5\' 1"  (1.549 m)   Wt 135 lb 3.2 oz (61.3 kg)   BMI 25.55 kg/m  No LMP recorded. Patient is postmenopausal.  General:   Alert, well-built, appropriate for her age, pleasant and cooperative in NAD Head:  Normocephalic and atraumatic. Eyes:  Sclera clear, no icterus.   Conjunctiva pink. Ears:  Normal auditory acuity. Nose:  No deformity, discharge, or lesions. Mouth:  No deformity or lesions,oropharynx pink & moist. Neck:  Supple; no masses or thyromegaly. Lungs:  Respirations even and unlabored.  Clear throughout to auscultation.   No wheezes, crackles, or rhonchi. No acute distress. Heart:  Regular rate and rhythm; no murmurs, clicks, rubs, or gallops. Abdomen:  Normal bowel sounds. Soft, non-tender and distended, tympanic without masses, hepatosplenomegaly or hernias noted.  No guarding or rebound tenderness.   Rectal: Not performed Msk:  Symmetrical without gross deformities. Good, equal movement & strength bilaterally. Pulses:  Normal pulses noted. Extremities:  No clubbing or edema.  No cyanosis. Neurologic:  Alert and  oriented x3;  grossly normal neurologically. Skin:  Intact without significant lesions or rashes. No jaundice, 3+ nonpitting edema likely secondary to venous stasis. Lymph Nodes:  No significant cervical adenopathy. Psych:  Alert and cooperative. Normal mood and affect.  Imaging Studies: No abdominal imaging  Assessment and Plan:   Christyanna Mckeon is a 84 y.o. Caucasian female with history of A. Fib on rivaroxaban, hypertension, admission to [facility] in 10/2017 with lower extremity cellulitis and subsequently developed C. Difficile in the hospital status post treatment with oral vancomycin and metronidazole for 2 weeks. Diarrhea has resolved.  She had first recurrence of C. difficile in 12/2017, status post treatment with oral vancomycin 125 mg, extended taper.  Patient had second recurrence of C. difficile on 03/15/2018, status post treatment with 2 weeks course of oral vancomycin.  Currently, diarrhea has resolved.  If patient develops 3rd recurrence of C. difficile, she will start fidaxomicin and evaluate for FMT   Microcytic anemia: Improving Continue oral iron for now Recheck iron studies during next visit  Follow up in 4-6 weeks   Heather Darby, MD

## 2018-04-04 ENCOUNTER — Encounter: Payer: Self-pay | Admitting: Gastroenterology

## 2018-04-04 ENCOUNTER — Other Ambulatory Visit: Payer: Self-pay

## 2018-04-04 DIAGNOSIS — A0471 Enterocolitis due to Clostridium difficile, recurrent: Secondary | ICD-10-CM

## 2018-04-04 NOTE — Telephone Encounter (Signed)
Heather Larson (DPR signed) left v/m wanting to let Heather Fitz NP know that Heather Larson has sent text on my chart and wants Heather Larson to read the mychart note.

## 2018-04-05 NOTE — Telephone Encounter (Signed)
Pt left vm to receive her Lab results from yesterday

## 2018-04-06 ENCOUNTER — Other Ambulatory Visit: Payer: Self-pay | Admitting: Gastroenterology

## 2018-04-06 DIAGNOSIS — A0471 Enterocolitis due to Clostridium difficile, recurrent: Secondary | ICD-10-CM

## 2018-04-06 LAB — C DIFFICILE TOXINS A+B W/RFLX: C DIFFICILE TOXINS A+B, EIA: POSITIVE — AB

## 2018-04-06 MED ORDER — FIDAXOMICIN 200 MG PO TABS: 200.0000 mg | ORAL_TABLET | Freq: Two times a day (BID) | ORAL | 0 refills | Status: DC

## 2018-04-09 ENCOUNTER — Encounter: Payer: Self-pay | Admitting: Gastroenterology

## 2018-04-09 ENCOUNTER — Other Ambulatory Visit: Payer: Self-pay

## 2018-04-09 DIAGNOSIS — Z114 Encounter for screening for human immunodeficiency virus [HIV]: Secondary | ICD-10-CM

## 2018-04-09 DIAGNOSIS — Z1159 Encounter for screening for other viral diseases: Secondary | ICD-10-CM

## 2018-04-09 DIAGNOSIS — A0472 Enterocolitis due to Clostridium difficile, not specified as recurrent: Secondary | ICD-10-CM

## 2018-04-09 DIAGNOSIS — A0471 Enterocolitis due to Clostridium difficile, recurrent: Secondary | ICD-10-CM

## 2018-04-09 DIAGNOSIS — K759 Inflammatory liver disease, unspecified: Secondary | ICD-10-CM

## 2018-04-09 MED ORDER — FIDAXOMICIN 200 MG PO TABS: 200.0000 mg | ORAL_TABLET | Freq: Two times a day (BID) | ORAL | 0 refills | Status: DC

## 2018-04-09 NOTE — Progress Notes (Signed)
Pt's daughter Shauna Hugh  has been notified of lab results and verbalized understanding,  medication has been sent to pharmacy.

## 2018-04-11 ENCOUNTER — Telehealth: Payer: Self-pay | Admitting: Gastroenterology

## 2018-04-11 NOTE — Telephone Encounter (Signed)
Pt is almost out of her antibiotic. Pls refill an call pt

## 2018-04-11 NOTE — Telephone Encounter (Signed)
Spoke with pt and confirmed that medication has been sent to pharmacy on 04/09/18, pt verbalized understanding

## 2018-04-11 NOTE — Telephone Encounter (Signed)
Pt is calling for a new  written order to renew rx antibiotic rx dificid send  To [employer] [facility] [address]

## 2018-04-16 ENCOUNTER — Encounter: Payer: Self-pay | Admitting: Family

## 2018-04-16 ENCOUNTER — Ambulatory Visit: Payer: Medicare Other | Attending: Family | Admitting: Family

## 2018-04-16 VITALS — BP 140/78 | HR 61 | Resp 18 | Ht 62.0 in | Wt 132.4 lb

## 2018-04-16 DIAGNOSIS — E785 Hyperlipidemia, unspecified: Secondary | ICD-10-CM | POA: Insufficient documentation

## 2018-04-16 DIAGNOSIS — Z114 Encounter for screening for human immunodeficiency virus [HIV]: Secondary | ICD-10-CM | POA: Diagnosis not present

## 2018-04-16 DIAGNOSIS — I110 Hypertensive heart disease with heart failure: Secondary | ICD-10-CM | POA: Diagnosis not present

## 2018-04-16 DIAGNOSIS — D509 Iron deficiency anemia, unspecified: Secondary | ICD-10-CM

## 2018-04-16 DIAGNOSIS — Z79899 Other long term (current) drug therapy: Secondary | ICD-10-CM | POA: Insufficient documentation

## 2018-04-16 DIAGNOSIS — Z7901 Long term (current) use of anticoagulants: Secondary | ICD-10-CM | POA: Diagnosis not present

## 2018-04-16 DIAGNOSIS — A0471 Enterocolitis due to Clostridium difficile, recurrent: Secondary | ICD-10-CM | POA: Diagnosis not present

## 2018-04-16 DIAGNOSIS — I890 Lymphedema, not elsewhere classified: Secondary | ICD-10-CM | POA: Insufficient documentation

## 2018-04-16 DIAGNOSIS — I4821 Permanent atrial fibrillation: Secondary | ICD-10-CM | POA: Insufficient documentation

## 2018-04-16 DIAGNOSIS — D649 Anemia, unspecified: Secondary | ICD-10-CM | POA: Diagnosis not present

## 2018-04-16 DIAGNOSIS — I10 Essential (primary) hypertension: Secondary | ICD-10-CM

## 2018-04-16 DIAGNOSIS — Z1159 Encounter for screening for other viral diseases: Secondary | ICD-10-CM | POA: Diagnosis not present

## 2018-04-16 DIAGNOSIS — I5032 Chronic diastolic (congestive) heart failure: Secondary | ICD-10-CM

## 2018-04-16 DIAGNOSIS — R5383 Other fatigue: Secondary | ICD-10-CM | POA: Diagnosis present

## 2018-04-16 DIAGNOSIS — K759 Inflammatory liver disease, unspecified: Secondary | ICD-10-CM | POA: Diagnosis not present

## 2018-04-16 NOTE — Patient Instructions (Signed)
Continue weighing daily and call for an overnight weight gain of > 2 pounds or a weekly weight gain of >5 pounds. 

## 2018-04-16 NOTE — Progress Notes (Addendum)
Patient ID: Heather Larson, female    DOB: [birth year], 84 y.o.   MRN: [id number]  HPI  Heather Larson is a 84 y/o female with a history of hyperlipidemia, HTN, hyponatremia, cellulitis, atrial fibrillation, anemia and chronic heart failure.   Echo report from 03/05/18 reviewed and showed an EF of 60-65% along with mild MR and normal PA pressure.   Admitted 03/04/18 due to acute on chronic HF. Cardiology consult obtained. Initially needed IV lasix and then transitioned to oral diuretics. Atrial fibrillation medications changed. Received IV iron along with 1 unit of PRBC's. due to anemia with plan of GI evaluation outpatient. Discharged after 4 days. Admitted 01/11/18 due to hyponatremia due to dehydration along with diarrhea. Given oral vancomycin due to C. difficile colitis. Given IV fluids. Discharged after 3 days.   Heather Larson presents today for a follow-up visit with a chief complaint of moderate fatigue upon minimal exertion. Heather Larson describes this as chronic in nature having been present for several years. Heather Larson has associated head congestion, intermittent wheezing, pedal edema and chronic back pain along with this. Heather Larson denies any difficulty sleeping, abdominal distention, palpitations, chest pain, shortness of breath, dizziness or weight gain. Currently being treated with antibiotic due to recurrent bout of c.diff.   Past Medical History:  Diagnosis Date  . Anemia   . Atrial fibrillation, permanent    on Xarelto  . Bilateral lower extremity edema   . Bilateral lower leg cellulitis 11/11/2017  . Cancer [facility])    breast cancer at age 43 and skin  . Cellulitis    lower abdomen  . CHF (congestive heart failure) ([place])   . Diarrhea 01/11/2018  . Hyperlipidemia   . Hypertension   . Hypertension   . Incontinence   . Low sodium levels   . Macular degeneration   . Malnutrition of moderate degree 11/23/2017   Past Surgical History:  Procedure Laterality Date  . BREAST SURGERY     right mastectomy  . EYE  SURGERY     x3  . IR RADIOLOGIST EVAL & MGMT  09/17/2017  . MASTECTOMY     right  . SHOULDER OPEN ROTATOR CUFF REPAIR     left  . SKIN BIOPSY     skin cancer   No family history on file. Social History   Tobacco Use  . Smoking status: Never Smoker  . Smokeless tobacco: Never Used  Substance Use Topics  . Alcohol use: Not Currently    Alcohol/week: 2.0 standard drinks    Types: 2 Shots of liquor per week    Comment: 2 drinks daily   No Known Allergies  Prior to Admission medications   Medication Sig Start Date End Date Taking? Authorizing Provider  acidophilus (RISAQUAD) CAPS capsule Take 2 capsules by mouth 3 (three) times daily. 01/14/18  Yes [name], MD  albuterol (PROVENTIL HFA;VENTOLIN HFA) 108 (90 Base) MCG/ACT inhaler Inhale 2 puffs into the lungs every 6 (six) hours as needed for wheezing or shortness of breath.   Yes [provider]  atorvastatin (LIPITOR) 10 MG tablet Take 1 tablet (10 mg total) by mouth daily. 10/02/17  Yes Gollan, Kathlene November, MD  brimonidine (ALPHAGAN) 0.2 % ophthalmic solution Place 1 drop into both eyes 3 (three) times daily. 01/02/18  Yes [provider]  calcium carbonate (OSCAL) 1500 (600 Ca) MG TABS tablet Take by mouth 2 (two) times daily with a meal.   Yes [provider]  dorzolamide (TRUSOPT) 2 % ophthalmic solution Place 1  drop into both eyes 3 (three) times daily. 01/02/18  Yes [provider]  fidaxomicin (DIFICID) 200 MG TABS tablet Take 1 tablet (200 mg total) by mouth 2 (two) times daily with a meal for 14 days. 04/09/18 04/23/18 Yes Vanga, Tally Due, MD  furosemide (LASIX) 40 MG tablet Take 1 tablet (40 mg total) by mouth daily. 03/08/18  Yes Gladstone Lighter, MD  metoprolol succinate (TOPROL-XL) 100 MG 24 hr tablet Take 1 tablet (100 mg total) by mouth daily. Take with or immediately following a meal. 03/26/18 06/24/18 Yes Dunn, Areta Haber, PA-C  Multiple Vitamins-Minerals (MULTIVITAMIN ADULT)  TABS Take 1 tablet by mouth daily.   Yes [provider]  Nutritional Supplements (NUTRITIONAL SHAKE PLUS PROTEIN PO) Take by mouth.   Yes [provider]  pantoprazole (PROTONIX) 40 MG tablet Take 1 tablet (40 mg total) by mouth daily. 03/09/18  Yes Gladstone Lighter, MD  Rivaroxaban (XARELTO) 15 MG TABS tablet Take 1 tablet (15 mg total) by mouth daily. 10/02/17  Yes Gollan, Kathlene November, MD  TRAVATAN Z 0.004 % SOLN ophthalmic solution Place 1 drop into both eyes at bedtime.  01/03/18  Yes [provider]  vancomycin (VANCOCIN) 125 MG capsule Take 1 capsule (125 mg total) by mouth 4 (four) times daily. 03/15/18  Yes Pleas Koch, NP  vitamin C (ASCORBIC ACID) 500 MG tablet Take 500 mg by mouth daily.   Yes [provider]    Review of Systems  Constitutional: Positive for fatigue (with minimal exertion). Negative for appetite change.  HENT: Positive for congestion and rhinorrhea. Negative for sore throat.   Eyes: Positive for redness (injected sclera bilaterally).  Respiratory: Positive for wheezing. Negative for cough and shortness of breath.   Cardiovascular: Positive for leg swelling (chronic). Negative for chest pain and palpitations.  Gastrointestinal: Positive for abdominal pain (after eating breakfast today). Negative for abdominal distention.  Endocrine: Negative.   Genitourinary: Negative.   Musculoskeletal: Positive for back pain. Negative for neck pain.  Skin: Negative.   Allergic/Immunologic: Negative.   Neurological: Negative for dizziness and light-headedness.  Hematological: Negative for adenopathy. Does not bruise/bleed easily.  Psychiatric/Behavioral: Negative for dysphoric mood and sleep disturbance (sleeping on 3-4 pillows for comfort). The patient is not nervous/anxious.    Vitals:   04/16/18 1159  BP: 140/78  Pulse: 61  Resp: 18  SpO2: 98%  Weight: 132 lb 6 oz (60 kg)  Height: 5\' 2"  (1.575 m)   Wt Readings from Last 3  Encounters:  04/16/18 132 lb 6 oz (60 kg)  04/02/18 135 lb 3.2 oz (61.3 kg)  03/26/18 134 lb (60.8 kg)   Lab Results  Component Value Date   CREATININE 0.79 03/26/2018   CREATININE 0.98 03/08/2018   CREATININE 0.78 03/07/2018    Physical Exam Vitals signs and nursing note reviewed.  Constitutional:      Appearance: Heather Larson is well-developed.  HENT:     Head: Normocephalic and atraumatic.  Eyes:     Conjunctiva/sclera:     Right eye: Right conjunctiva is injected.     Left eye: Left conjunctiva is injected.  Neck:     Musculoskeletal: Normal range of motion and neck supple.     Vascular: No JVD.  Cardiovascular:     Rate and Rhythm: Normal rate. Rhythm irregularly irregular.  Pulmonary:     Effort: Pulmonary effort is normal. No respiratory distress.     Breath sounds: No rhonchi or rales.  Abdominal:     Palpations:  Abdomen is soft.  Musculoskeletal:     Right lower leg: Heather Larson exhibits no tenderness. Edema (2+ pitting) present.     Left lower leg: Heather Larson exhibits no tenderness. Edema (2+ pitting) present.  Skin:    General: Skin is warm and dry.  Neurological:     Mental Status: Heather Larson is alert and oriented to person, place, and time.  Psychiatric:        Behavior: Behavior normal.    Assessment & Plan:  1: Chronic heart failure with preserved ejection fraction- - NYHA class III - euvolemic today - limited in diuretic usage due to history of hyponatremia - weighing daily and says that her home weight ranges from 127-129 pounds. Reminded her to call for an overnight weight gain of >2 pounds or a weekly weight gain of >5 pounds - weight unchanged from last visit here 5 weeks ago - not adding salt. Reviewed the importance of closely following a 2000mg  sodium diet - saw cardiology (Dunn) 03/26/18 - BNP 03/04/18 was 903.0 - receiving home health nursing and PT - patient reports receiving her flu vaccine for this season  2: HTN- - BP looks good today - saw PCP Carlis Abbott)  03/15/18 - BMP 03/26/18 reviewed and showed sodium 134, potassium 5.7, creatinine 0.79 and GFR 65  3: Anemia- - hemoglobin 03/26/18 was 10.8 - saw GI (Vanga) 04/02/18  4: Lymphedema- - stage 2 - unable to tolerate wearing compression socks - unable to use the ACE wraps as they are too difficult to use - has been elevating her legs when sitting for long periods of time but edema has persisted - limited in her ability to exercise due to symptoms - will make a referral for lymphapress compression boots  Medication list was reviewed.  Return in 3 months or sooner for any questions/problems before then.

## 2018-04-17 ENCOUNTER — Telehealth: Payer: Self-pay | Admitting: Gastroenterology

## 2018-04-17 DIAGNOSIS — A0471 Enterocolitis due to Clostridium difficile, recurrent: Secondary | ICD-10-CM | POA: Diagnosis not present

## 2018-04-17 LAB — HEPATITIS C ANTIBODY: Hep C Virus Ab: <0.1 s/co ratio (ref 0.0–0.9)

## 2018-04-17 LAB — HEPATITIS B SURFACE ANTIGEN: HEP B S AG: NEGATIVE

## 2018-04-17 LAB — HEPATITIS A ANTIBODY, TOTAL: HEP A TOTAL AB: NEGATIVE

## 2018-04-17 LAB — HIV ANTIBODY (ROUTINE TESTING W REFLEX): HIV Screen 4th Generation wRfx: NONREACTIVE

## 2018-04-17 LAB — RPR: RPR: NONREACTIVE

## 2018-04-17 MED ORDER — FIDAXOMICIN 200 MG PO TABS: 200.0000 mg | ORAL_TABLET | Freq: Two times a day (BID) | ORAL | 0 refills | Status: DC

## 2018-04-17 NOTE — Telephone Encounter (Signed)
Called patient's daughter, Shauna Hugh who answered my phone call.  She reports that Ms. Flara is no longer experiencing diarrhea.  She is currently having 1-2 semi-formed/soft bowel movements daily.  She is currently on Dificid 200 mg twice daily.  She already finished 10 days course of Dificid 200 mg twice daily.  I advised her to decrease Dificid to once a day for 5 days and monitor for recurrence of diarrhea.  If she has no recurrence, she is going to further decrease to 1 pill every other day.  If her diarrhea worsens, she will go back to twice daily.  I sent in another refill for Dificid 200 twice daily for 28 days.  We are waiting for stool studies to rule out other pathogens including ova and parasites HIV, hepatitis C antibody total, hep B surface antigen, RPR, HCV antibody came back negative Her daughter expressed the concern that Ms. Ayliana Casciano will have change in insurance effective January 1.  She prefers her mom to undergo FMT before then due to concern for high co-pay with new insurance This is her third recurrence of C. Difficile infection  First episode 11/19/2017 Second episode 01/11/2018 Third episode 03/15/2018 Fourth episode 04/04/2018  Cephas Darby, MD [address]  [place]  [place], [place] [address]  Main: [phone]  Fax: [phone] Pager: [phone]

## 2018-04-19 ENCOUNTER — Telehealth: Payer: Self-pay | Admitting: Gastroenterology

## 2018-04-19 ENCOUNTER — Other Ambulatory Visit: Payer: Self-pay

## 2018-04-19 DIAGNOSIS — A0471 Enterocolitis due to Clostridium difficile, recurrent: Secondary | ICD-10-CM

## 2018-04-19 NOTE — Telephone Encounter (Signed)
Pt daughter is calling to check on Lab results   cb [phone]

## 2018-04-20 LAB — GI PROFILE, STOOL, PCR
ASTROVIRUS: NOT DETECTED
Adenovirus F 40/41: NOT DETECTED
C difficile toxin A/B: NOT DETECTED
Campylobacter: NOT DETECTED
Cryptosporidium: NOT DETECTED
Cyclospora cayetanensis: NOT DETECTED
ENTEROPATHOGENIC E COLI: NOT DETECTED
Entamoeba histolytica: NOT DETECTED
Enteroaggregative E coli: NOT DETECTED
Enterotoxigenic E coli: NOT DETECTED
Giardia lamblia: NOT DETECTED
Norovirus GI/GII: NOT DETECTED
PLESIOMONAS SHIGELLOIDES: NOT DETECTED
ROTAVIRUS A: NOT DETECTED
SAPOVIRUS: NOT DETECTED
SHIGA-TOXIN-PRODUCING E COLI: NOT DETECTED
SHIGELLA/ENTEROINVASIVE E COLI: NOT DETECTED
Salmonella: NOT DETECTED
VIBRIO CHOLERAE: NOT DETECTED
VIBRIO: NOT DETECTED
Yersinia enterocolitica: NOT DETECTED

## 2018-04-22 LAB — OVA AND PARASITE EXAMINATION

## 2018-04-22 NOTE — Progress Notes (Signed)
Cardiology Office Note Date:  04/29/2018  Patient ID:  Heather Larson, DOB Jun 21, 1923, MRN [id number] PCP:  Pleas Koch, NP  Cardiologist:  Dr. Rockey Situ, MD    Chief Complaint: Follow-up  History of Present Illness: Heather Larson is a 84 y.o. female with history of permanent Afib on Xarelto, chronic diastolic CHF, lower extremity edema felt to be in the setting of dependent edema exacerbated by diltiazem use, HTN, HLD, chronic hyponatremia, iron deficiency anemia, and breast cancer s/p mastectomy who presents for follow up of A. fib, diastolic CHF, and lower extremity swelling.  Patient was initially diagnosed with Afib in 2014 and has been anticoagulated with Xarelto. Echo from 2014 showed EF 60-65%, biatrial enlargement, aortic valve sclerosis, trivial AI, mild MR and TR. Patient established with Dr. Rockey Situ in 07/2017 for evaluation of Afib and lower extremity swelling. At that time, the patient had stopped Bumex several months prior as she was bothered by frequent voiding and incontinence. She was noted to have been on diltiazem at that time as well. Echo in 07/2017 showed an EF of 55-60%, no RWMA, mild AI, mild to moderate MR, moderately dilated LA, RVSF normal, PASP normal. Given her lower extremity swelling, her diltiazem was stopped and she was placed on metoprolol for rate control.   She was admitted to [facility] 11/5-11/8 with increased SOB and leg swelling x 2-3 days. She indicated compliance with medications including diuretic. She was noted to be in Afib with RVR with ventricular rates in the low 100s bpm. CXR showed findings suggestive of interstitial edema and bilateral pleural effusions. BNP 903. Troponin < 0.03. HGB 8.9 with a baseline of ~9.5 and trended down to 7.6 requiring pRBC transfusion and iron infusion. She was rate controlled and diuresed. Echo during this admission showed an EF of 60-65%, no RWMA, mild MR, mildly dilated LA, RVSF was grossly normal, PASP normal. Discharge  SCr of 0.98. Discharge weight of 64.7 kg.   She followed up with the [facility]F Clinic on 11/13 with a BP of 155/66 and weight of 60.4 kg. No changes. She saw PCP on 11/15 with a BP of 140/68 and a weight of 59.6 kg. She noted intermittent diarrhea since her discharge. Stool studies showed the patient was positive for C diff. She was started vancomycin. She is scheduled for a fecal transplant on 05/02/2018.   She was seen in the office on 11/26 and was doing well from a cardiac standpoint. She noted a return of lower extremity swelling following resumption of diltiazem during her 03/2018 admission. Her diltiazem was stopped and her Toprol was titrated to 100 mg daily.   She comes in accompanied by family today and is doing well from a cardiac perspective.  No chest pain or shortness of breath.  Lower extremity swelling improved somewhat following discontinuation of diltiazem.  Lymphedema pumps have arrived.  She has not yet placed them on.  No palpitations.  No BRBPR or melena.  Weight has remained stable between 126 and 130 pounds at home.  No orthopnea, PND, early satiety, dizziness, presyncope, or syncope.  She requests to decrease her Lasix as her current dosing of 40 mg daily significantly impairs her quality of life.  Patient indicates that she cannot go anywhere as she is always in the restroom.  She also frequently wets herself at this dose.  She otherwise does not have any concerns at this time.   Past Medical History:  Diagnosis Date  . Anemia   . Atrial fibrillation, permanent  on Xarelto  . Bilateral lower extremity edema   . Bilateral lower leg cellulitis 11/11/2017  . Cancer [facility]    breast cancer at age 24 and skin  . Cellulitis    lower abdomen  . CHF (congestive heart failure) ([place])   . Diarrhea 01/11/2018  . Hyperlipidemia   . Hypertension   . Hypertension   . Incontinence   . Low sodium levels   . Macular degeneration   . Malnutrition of moderate degree 11/23/2017    Past  Surgical History:  Procedure Laterality Date  . BREAST SURGERY     right mastectomy  . EYE SURGERY     x3  . IR RADIOLOGIST EVAL & MGMT  09/17/2017  . MASTECTOMY     right  . SHOULDER OPEN ROTATOR CUFF REPAIR     left  . SKIN BIOPSY     skin cancer    Current Meds  Medication Sig  . acidophilus (RISAQUAD) CAPS capsule Take 2 capsules by mouth 3 (three) times daily. (Patient taking differently: Take 1 capsule by mouth daily. )  . albuterol (PROVENTIL HFA;VENTOLIN HFA) 108 (90 Base) MCG/ACT inhaler Inhale 2 puffs into the lungs every 6 (six) hours as needed for wheezing or shortness of breath.  Marland Kitchen atorvastatin (LIPITOR) 10 MG tablet Take 1 tablet (10 mg total) by mouth daily.  . brimonidine (ALPHAGAN) 0.2 % ophthalmic solution Place 1 drop into both eyes 3 (three) times daily.  . calcium carbonate (OSCAL) 1500 (600 Ca) MG TABS tablet Take by mouth 2 (two) times daily with a meal.  . dorzolamide (TRUSOPT) 2 % ophthalmic solution Place 1 drop into both eyes 3 (three) times daily.  . fidaxomicin (DIFICID) 200 MG TABS tablet Take 1 tablet (200 mg total) by mouth 2 (two) times daily with a meal for 28 days. (Patient taking differently: Take 200 mg by mouth daily. )  . furosemide (LASIX) 40 MG tablet Take 1 tablet (40 mg total) by mouth daily.  . metoprolol succinate (TOPROL-XL) 100 MG 24 hr tablet Take 1 tablet (100 mg total) by mouth daily. Take with or immediately following a meal.  . Multiple Vitamins-Minerals (MULTIVITAMIN ADULT) TABS Take 1 tablet by mouth daily.  . Nutritional Supplements (NUTRITIONAL SHAKE PLUS PROTEIN PO) Take by mouth.  . pantoprazole (PROTONIX) 40 MG tablet Take 1 tablet (40 mg total) by mouth daily.  . Rivaroxaban (XARELTO) 15 MG TABS tablet Take 1 tablet (15 mg total) by mouth daily.  . TRAVATAN Z 0.004 % SOLN ophthalmic solution Place 1 drop into both eyes at bedtime.   . vitamin C (ASCORBIC ACID) 500 MG tablet Take 500 mg by mouth daily.    Allergies:   Patient  has no known allergies.   Social History:  The patient  reports that she has never smoked. She has never used smokeless tobacco. She reports previous alcohol use of about 2.0 standard drinks of alcohol per week. She reports that she does not use drugs.   Family History:  The patient's family history is not on file.  ROS:   Review of Systems  Constitutional: Positive for malaise/fatigue. Negative for chills, diaphoresis, fever and weight loss.  HENT: Negative for congestion.   Eyes: Negative for discharge and redness.  Respiratory: Negative for cough, hemoptysis, sputum production, shortness of breath and wheezing.   Cardiovascular: Positive for leg swelling. Negative for chest pain, palpitations, orthopnea, claudication and PND.  Gastrointestinal: Negative for abdominal pain, blood in stool, constipation, diarrhea, heartburn, melena, nausea  and vomiting.  Genitourinary: Positive for frequency and urgency. Negative for hematuria.  Musculoskeletal: Negative for falls and myalgias.  Skin: Negative for rash.  Neurological: Negative for dizziness, tingling, tremors, sensory change, speech change, focal weakness, loss of consciousness and weakness.  Endo/Heme/Allergies: Does not bruise/bleed easily.  Psychiatric/Behavioral: Negative for substance abuse. The patient is not nervous/anxious.   All other systems reviewed and are negative.    PHYSICAL EXAM:  VS:  BP 140/60 (BP Location: Left Arm, Patient Position: Sitting, Cuff Size: Normal)   Pulse 82   Ht 5\' 2"  (1.575 m)   Wt 133 lb (60.3 kg)   BMI 24.33 kg/m  BMI: Body mass index is 24.33 kg/m.  Physical Exam  Constitutional: She is oriented to person, place, and time. She appears well-developed and well-nourished.  Kyphosis of the thoracic spine noted  HENT:  Head: Normocephalic and atraumatic.  Eyes: Right eye exhibits no discharge. Left eye exhibits no discharge.  Bilateral scleral injection  Neck: Normal range of motion. No JVD  present.  Cardiovascular: Normal rate, S1 normal and S2 normal. An irregularly irregular rhythm present. Exam reveals no distant heart sounds, no friction rub, no midsystolic click and no opening snap.  Murmur heard. High-pitched blowing holosystolic murmur is present with a grade of 2/6 at the apex. Pulses:      Posterior tibial pulses are 2+ on the right side and 2+ on the left side.  Pulmonary/Chest: Effort normal and breath sounds normal. No respiratory distress. She has no decreased breath sounds. She has no wheezes. She has no rales. She exhibits no tenderness.  Abdominal: Soft. She exhibits no distension. There is no abdominal tenderness.  Musculoskeletal:        General: Edema present.     Comments: 1+ bilateral lower extremity pitting edema  Neurological: She is alert and oriented to person, place, and time.  Skin: Skin is warm and dry. No cyanosis. Nails show no clubbing.  Psychiatric: She has a normal mood and affect. Her speech is normal and behavior is normal. Judgment and thought content normal.     EKG:  Was ordered and interpreted by me today. Shows A. fib, 82 bpm, underlying artifact, no acute ST-T changes  Recent Labs: 07/31/2017: Pro B Natriuretic peptide (BNP) 467.0 11/24/2017: Magnesium 1.8 03/04/2018: ALT 18; B Natriuretic Peptide 903.0; TSH 2.036 03/26/2018: BUN 18; Creatinine, Ser 0.79; Hemoglobin 10.8; Platelets 431; Sodium 134 03/27/2018: Potassium 4.2  07/31/2017: Cholesterol 141; HDL 74.00; LDL Cholesterol 56; Total CHOL/HDL Ratio 2; Triglycerides 53.0; VLDL 10.6   CrCl cannot be calculated (Patient's most recent lab result is older than the maximum 21 days allowed.).   Wt Readings from Last 3 Encounters:  04/29/18 133 lb (60.3 kg)  04/16/18 132 lb 6 oz (60 kg)  04/02/18 135 lb 3.2 oz (61.3 kg)     Other studies reviewed: Additional studies/records reviewed today include: summarized above  ASSESSMENT AND PLAN:  1. Permanent A. Fib: Well-controlled  ventricular rates.  Continue Toprol-XL 100 mg daily for rate control.  Continue Xarelto 15 mg daily given her creatinine clearance less than 50 mL/min based on most recent labs.  Discussed with GI, she will hold Xarelto 2 days prior to her upcoming fecal transplant.  Resumption of Xarelto at the discretion of performing MD.  2. HFpEF: This has likely been exacerbated by her worsening anemia and A. fib with RVR.  She appears well compensated today.  Her lower extremity swelling is likely being driven by chronic venous insufficiency  and has previously been exacerbated by diltiazem usage.  At patient request, in an effort to improve her quality of life, we will decrease her Lasix to 20 mg every other day.  CHF education.  3. Lower extremity swelling: Likely in the setting of dependent edema with chronic venous insufficiency.  She has received lymphedema pumps and plans to try them on under the direction of the CHF clinic.  Recommend she continue to elevate her legs.  Continue Lasix as outlined above.  4. Iron deficiency anemia: Most recent CBC demonstrated a normal hemoglobin.  Followed by PCP.  5. Hypertension: Blood pressure is reasonably controlled today.  Continue current medications.  6. Hyperlipidemia: LDL of 56 from 07/2017.  Remains on Lipitor.  Disposition: F/u with Dr. Rockey Situ or an APP in 6 months, sooner if needed.  Current medicines are reviewed at length with the patient today.  The patient did not have any concerns regarding medicines.  Signed, Christell Faith, PA-C 04/29/2018 11:51 AM     [facility] [address] [place] [address] [place], [place] [address] [phone]

## 2018-04-25 NOTE — Telephone Encounter (Signed)
Pt's daughter has been notified of lab results.  

## 2018-04-26 ENCOUNTER — Telehealth: Payer: Self-pay | Admitting: Family

## 2018-04-26 NOTE — Telephone Encounter (Signed)
Returned patient's message that she had left regarding decreasing diuretic dose. LM on her voicemail that she could try decreasing her dosage to 1/2 tablet daily (20mg ) but to closely watch for worsening edema or weight gain of >2 pounds overnight or >5 pounds in a week.   If edema or weight gain occurs, she will need to go back to taking 1 tablet daily (40mg ).   Instructed her to call me back for any further questions/ concerns.

## 2018-04-29 ENCOUNTER — Ambulatory Visit (INDEPENDENT_AMBULATORY_CARE_PROVIDER_SITE_OTHER): Payer: Medicare Other | Admitting: Physician Assistant

## 2018-04-29 ENCOUNTER — Other Ambulatory Visit: Payer: Self-pay | Admitting: *Deleted

## 2018-04-29 ENCOUNTER — Telehealth: Payer: Self-pay | Admitting: Gastroenterology

## 2018-04-29 ENCOUNTER — Other Ambulatory Visit: Payer: Self-pay | Admitting: Physician Assistant

## 2018-04-29 ENCOUNTER — Encounter: Payer: Self-pay | Admitting: Physician Assistant

## 2018-04-29 VITALS — BP 140/60 | HR 82 | Ht 62.0 in | Wt 133.0 lb

## 2018-04-29 DIAGNOSIS — I4821 Permanent atrial fibrillation: Secondary | ICD-10-CM

## 2018-04-29 DIAGNOSIS — D509 Iron deficiency anemia, unspecified: Secondary | ICD-10-CM

## 2018-04-29 DIAGNOSIS — I5032 Chronic diastolic (congestive) heart failure: Secondary | ICD-10-CM

## 2018-04-29 DIAGNOSIS — R600 Localized edema: Secondary | ICD-10-CM

## 2018-04-29 DIAGNOSIS — I10 Essential (primary) hypertension: Secondary | ICD-10-CM

## 2018-04-29 DIAGNOSIS — E782 Mixed hyperlipidemia: Secondary | ICD-10-CM | POA: Diagnosis not present

## 2018-04-29 MED ORDER — FUROSEMIDE 20 MG PO TABS: 20.0000 mg | ORAL_TABLET | ORAL | Status: DC

## 2018-04-29 MED ORDER — FUROSEMIDE 20 MG PO TABS: 20.0000 mg | ORAL_TABLET | ORAL | 1 refills | Status: DC

## 2018-04-29 NOTE — Telephone Encounter (Signed)
Daughter ([name]) came by office to pick up plenvu prep. Pt states that cardiologist gave the okay to hold Xarelto x3 days and to take medication after she gets home from her procedure. Daughter will have her hold this Wednesday, Thursday, and take Friday pm after procedure. Currently she and pt has agreed to Friday 05/03/18. If she needs to reschedule she will let us know. Pt was waking up when she mentioned it to her but will re-confirm. Went over instructions for prep (colonoscopy). To contact office if any questions.

## 2018-04-29 NOTE — Patient Instructions (Addendum)
Medication Instructions:  DECREASE the Furosemide to 20 mg every other day  If you need a refill on your cardiac medications before your next appointment, please call your pharmacy.   Lab work: None ordered  Testing/Procedures: None ordered  Follow-Up: At [facility], you and your health needs are our priority.  As part of our continuing mission to provide you with exceptional heart care, we have created designated Provider Care Teams.  These Care Teams include your primary Cardiologist (physician) and Advanced Practice Providers (APPs -  Physician Assistants and Nurse Practitioners) who all work together to provide you with the care you need, when you need it. You will need a follow up appointment in 6 months.  Please call our office 2 months in advance to schedule this appointment.  You may see Dr. Rockey Situ or one of the following Advanced Practice Providers on your designated Care Team:   Murray Hodgkins, NP Christell Faith, PA-C  Special Instructions: Please hold Xarelto 2 days prior to the procedure (Fecal Transplant) and then resume according to GI's instructions.

## 2018-04-29 NOTE — Telephone Encounter (Signed)
I spoke with pt's daughter regarding prep and I also received a call from Endo that we need to either reschedule fecal transfer to 05/03/18 due to specimen will not be here till then or next week. She is coming by the office for the prep sample and I will go over instructions.

## 2018-04-29 NOTE — Telephone Encounter (Signed)
Pt daughter is calling for Prep instructions on pt procedure 05/02/2018 she will be close by around 11:30 if she needs to pick up anything please call [phone]

## 2018-04-29 NOTE — Telephone Encounter (Signed)
Information sent via my chart, pt already had labs done. Pt is currently in cardiologist office. Will contact via phone this pm.

## 2018-05-02 ENCOUNTER — Encounter: Payer: Self-pay | Admitting: Emergency Medicine

## 2018-05-02 NOTE — Telephone Encounter (Signed)
Was put on Heather Larson's schedule for today due to some issues with getting the stool when i was around

## 2018-05-02 NOTE — Telephone Encounter (Signed)
Think [name] is supposed to do it today, couldn't get stool on time before I left

## 2018-05-03 ENCOUNTER — Other Ambulatory Visit: Payer: Self-pay

## 2018-05-03 ENCOUNTER — Ambulatory Visit: Payer: Medicare HMO | Admitting: Registered Nurse

## 2018-05-03 ENCOUNTER — Encounter
Admission: RE | Disposition: A | Discharge status: Home / Self Care | Payer: Self-pay | Source: Home / Self Care | Attending: Gastroenterology

## 2018-05-03 ENCOUNTER — Ambulatory Visit
Admission: RE | Admit: 2018-05-03 | Discharge: 2018-05-03 | Disposition: A | Discharge status: Home / Self Care | Payer: Medicare HMO | Attending: Gastroenterology | Admitting: Gastroenterology

## 2018-05-03 ENCOUNTER — Encounter: Payer: Self-pay | Admitting: *Deleted

## 2018-05-03 DIAGNOSIS — Z853 Personal history of malignant neoplasm of breast: Secondary | ICD-10-CM | POA: Insufficient documentation

## 2018-05-03 DIAGNOSIS — E785 Hyperlipidemia, unspecified: Secondary | ICD-10-CM | POA: Insufficient documentation

## 2018-05-03 DIAGNOSIS — I509 Heart failure, unspecified: Secondary | ICD-10-CM | POA: Diagnosis not present

## 2018-05-03 DIAGNOSIS — K5790 Diverticulosis of intestine, part unspecified, without perforation or abscess without bleeding: Secondary | ICD-10-CM | POA: Diagnosis not present

## 2018-05-03 DIAGNOSIS — K5730 Diverticulosis of large intestine without perforation or abscess without bleeding: Secondary | ICD-10-CM | POA: Insufficient documentation

## 2018-05-03 DIAGNOSIS — Z79899 Other long term (current) drug therapy: Secondary | ICD-10-CM | POA: Insufficient documentation

## 2018-05-03 DIAGNOSIS — I110 Hypertensive heart disease with heart failure: Secondary | ICD-10-CM | POA: Insufficient documentation

## 2018-05-03 DIAGNOSIS — Z85828 Personal history of other malignant neoplasm of skin: Secondary | ICD-10-CM | POA: Diagnosis not present

## 2018-05-03 DIAGNOSIS — A0471 Enterocolitis due to Clostridium difficile, recurrent: Secondary | ICD-10-CM

## 2018-05-03 DIAGNOSIS — H3530 Unspecified macular degeneration: Secondary | ICD-10-CM | POA: Diagnosis not present

## 2018-05-03 DIAGNOSIS — Z7901 Long term (current) use of anticoagulants: Secondary | ICD-10-CM | POA: Diagnosis not present

## 2018-05-03 DIAGNOSIS — I4821 Permanent atrial fibrillation: Secondary | ICD-10-CM | POA: Insufficient documentation

## 2018-05-03 DIAGNOSIS — A0472 Enterocolitis due to Clostridium difficile, not specified as recurrent: Secondary | ICD-10-CM | POA: Diagnosis not present

## 2018-05-03 DIAGNOSIS — I5033 Acute on chronic diastolic (congestive) heart failure: Secondary | ICD-10-CM | POA: Diagnosis not present

## 2018-05-03 HISTORY — PX: FECAL TRANSPLANT: SHX6383

## 2018-05-03 SURGERY — FECAL MICROBIOTA TRANSFER
Anesthesia: General

## 2018-05-03 MED ORDER — PROPOFOL 500 MG/50ML IV EMUL
INTRAVENOUS | Status: DC | PRN
  Administered 2018-05-03: 150 ug/kg/min via INTRAVENOUS

## 2018-05-03 MED ORDER — LIDOCAINE HCL (CARDIAC) PF 100 MG/5ML IV SOSY
PREFILLED_SYRINGE | INTRAVENOUS | Status: DC | PRN
  Administered 2018-05-03: 40 mg via INTRAVENOUS

## 2018-05-03 MED ORDER — PROPOFOL 10 MG/ML IV BOLUS
INTRAVENOUS | Status: DC | PRN
  Administered 2018-05-03: 50 mg via INTRAVENOUS

## 2018-05-03 MED ORDER — SODIUM CHLORIDE 0.9 % IV SOLN
INTRAVENOUS | Status: DC
  Administered 2018-05-03: 11:00:00 via INTRAVENOUS

## 2018-05-03 MED ORDER — PROPOFOL 500 MG/50ML IV EMUL
INTRAVENOUS | Status: AC
  Filled 2018-05-03: qty 50

## 2018-05-03 NOTE — Anesthesia Post-op Follow-up Note (Signed)
Anesthesia QCDR form completed.        

## 2018-05-03 NOTE — H&P (Signed)
Vonda Antigua, MD [address] [place], [place], [place], [address] [id number], [place], [place], [place], [address] Phone: [phone]  Fax: [phone]  Primary Care Physician:  Pleas Koch, NP   Pre-Procedure History & Physical: HPI:  Heather Larson is a 85 y.o. female is here for a colonoscopy.   Past Medical History:  Diagnosis Date  . Anemia   . Atrial fibrillation, permanent    on Xarelto  . Bilateral lower extremity edema   . Bilateral lower leg cellulitis 11/11/2017  . Cancer [facility])    breast cancer at age 89 and skin  . Cellulitis    lower abdomen  . CHF (congestive heart failure) ([place])   . Diarrhea 01/11/2018  . Dysrhythmia    a-fib  . Hyperlipidemia   . Hypertension   . Hypertension   . Incontinence   . Low sodium levels   . Macular degeneration   . Malnutrition of moderate degree 11/23/2017    Past Surgical History:  Procedure Laterality Date  . BREAST SURGERY     right mastectomy  . EYE SURGERY     x3  . IR RADIOLOGIST EVAL & MGMT  09/17/2017  . MASTECTOMY     right  . SHOULDER OPEN ROTATOR CUFF REPAIR     left  . SKIN BIOPSY     skin cancer    Prior to Admission medications   Medication Sig Start Date End Date Taking? Authorizing Provider  acidophilus (RISAQUAD) CAPS capsule Take 2 capsules by mouth 3 (three) times daily. Patient taking differently: Take 1 capsule by mouth daily.  01/14/18  Yes [name], MD  atorvastatin (LIPITOR) 10 MG tablet Take 1 tablet (10 mg total) by mouth daily. 10/02/17  Yes Gollan, Kathlene November, MD  brimonidine (ALPHAGAN) 0.2 % ophthalmic solution Place 1 drop into both eyes 3 (three) times daily. 01/02/18  Yes [provider]  calcium carbonate (OSCAL) 1500 (600 Ca) MG TABS tablet Take by mouth 2 (two) times daily with a meal.   Yes [provider]  dorzolamide (TRUSOPT) 2 % ophthalmic solution Place 1 drop into both eyes 3 (three) times daily. 01/02/18  Yes [provider]  furosemide (LASIX) 20 MG tablet TAKE 1 TABLET(20 MG) BY MOUTH EVERY OTHER DAY Patient taking differently: 20 mg daily.  04/29/18  Yes Dunn, Areta Haber, PA-C  metoprolol succinate (TOPROL-XL) 100 MG 24 hr tablet Take 1 tablet (100 mg total) by mouth daily. Take with or immediately following a meal. 03/26/18 06/24/18 Yes Dunn, Areta Haber, PA-C  Multiple Vitamins-Minerals (MULTIVITAMIN ADULT) TABS Take 1 tablet by mouth daily.   Yes [provider]  Nutritional Supplements (NUTRITIONAL SHAKE PLUS PROTEIN PO) Take by mouth.   Yes [provider]  pantoprazole (PROTONIX) 40 MG tablet Take 1 tablet (40 mg total) by mouth daily. 03/09/18  Yes Gladstone Lighter, MD  Rivaroxaban (XARELTO) 15 MG TABS tablet Take 1 tablet (15 mg total) by mouth daily. 10/02/17  Yes Gollan, Kathlene November, MD  TRAVATAN Z 0.004 % SOLN ophthalmic solution Place 1 drop into both eyes at bedtime.  01/03/18  Yes [provider]  vitamin C (ASCORBIC ACID) 500 MG tablet Take 500 mg by mouth daily.   Yes [provider]  albuterol (PROVENTIL HFA;VENTOLIN HFA) 108 (90 Base) MCG/ACT inhaler Inhale 2 puffs into the lungs every 6 (six) hours as needed for wheezing or shortness of breath.    [provider]  fidaxomicin (DIFICID) 200 MG TABS tablet  Take 1 tablet (200 mg total) by mouth 2 (two) times daily with a meal for 28 days. Patient taking differently: Take 200 mg by mouth every other day.  04/17/18 05/15/18  Lin Landsman, MD    Allergies as of 04/19/2018  . (No Known Allergies)    History reviewed. No pertinent family history.  Social History   Socioeconomic History  . Marital status: Divorced    Spouse name: Not on file  . Number of children: Not on file  . Years of education: Not on file  . Highest education level: Not on file  Occupational History  . Not on file  Social Needs  . Financial resource strain: Not on file  . Food insecurity:    Worry: Not on file    Inability: Not  on file  . Transportation needs:    Medical: Not on file    Non-medical: Not on file  Tobacco Use  . Smoking status: Never Smoker  . Smokeless tobacco: Never Used  Substance and Sexual Activity  . Alcohol use: Not Currently    Alcohol/week: 2.0 standard drinks    Types: 2 Shots of liquor per week    Comment: 2 drinks daily  . Drug use: No  . Sexual activity: Not on file  Lifestyle  . Physical activity:    Days per week: Not on file    Minutes per session: Not on file  . Stress: Not on file  Relationships  . Social connections:    Talks on phone: Not on file    Gets together: Not on file    Attends religious service: Not on file    Active member of club or organization: Not on file    Attends meetings of clubs or organizations: Not on file    Relationship status: Not on file  . Intimate partner violence:    Fear of current or ex partner: Not on file    Emotionally abused: Not on file    Physically abused: Not on file    Forced sexual activity: Not on file  Other Topics Concern  . Not on file  Social History Narrative   Moved from [place].    Review of Systems: See HPI, otherwise negative ROS  Physical Exam: BP (!) 159/69   Pulse 81   Temp (!) 96.8 F (36 C) (Tympanic)   Resp 18   SpO2 97%  General:   Alert,  pleasant and cooperative in NAD Head:  Normocephalic and atraumatic. Neck:  Supple; no masses or thyromegaly. Lungs:  Clear throughout to auscultation, normal respiratory effort.    Heart:  +S1, +S2, Regular rate and rhythm, No edema. Abdomen:  Soft, nontender and nondistended. Normal bowel sounds, without guarding, and without rebound.   Neurologic:  Alert and  oriented x4;  grossly normal neurologically.  Impression/Plan: Heather Larson is here for a colonoscopy to be performed for fecal transplant for recurrent C. Diff. Patient and daughter present today and I had an extensive conversation with them in regard to the risks and benefits of the procedure  including the incidence of recent deaths after C. Diff transplant and new FDA guidelines about using open biome etc. Literature in regard to the updated consent given to them to read as well and they would like to proceed with the transplant knowing the risks.   Risks, benefits, limitations, and alternatives regarding  colonoscopy have been reviewed with the patient.  Questions have been answered.  All parties agreeable.   Heather Larson  Vivien Presto, MD  05/03/2018, 11:21 AM

## 2018-05-03 NOTE — Transfer of Care (Signed)
  Immediate Anesthesia Transfer of Care Note  Patient: Heather Larson  Procedure(s) Performed: Procedure(s): FECAL TRANSPLANT (N/A)  Patient Location: PACU and Endoscopy Unit  Anesthesia Type:General  Level of Consciousness: sedated  Airway & Oxygen Therapy: Patient Spontanous Breathing and Patient connected to nasal cannula oxygen  Post-op Assessment: Report given to RN and Post -op Vital signs reviewed and stable  Post vital signs: Reviewed and stable  Last Vitals:  Vitals:   05/03/18 1031 05/03/18 1208  BP: (!) 159/69 (!) 101/56  Pulse: 81 (!) 58  Resp: 18 18  Temp: (!) 36 C (!) 36.2 C  SpO2: 72% 90%    Complications: No apparent anesthesia complications

## 2018-05-03 NOTE — Anesthesia Procedure Notes (Signed)
Date/Time: 05/03/2018 11:28 AM Performed by: Doreen Salvage, CRNA Pre-anesthesia Checklist: Patient identified, Emergency Drugs available, Suction available and Patient being monitored Patient Re-evaluated:Patient Re-evaluated prior to induction Oxygen Delivery Method: Nasal cannula Induction Type: IV induction Dental Injury: Teeth and Oropharynx as per pre-operative assessment  Comments: Nasal cannula with etCO2 monitoring

## 2018-05-03 NOTE — Anesthesia Preprocedure Evaluation (Signed)
Anesthesia Evaluation  Patient identified by MRN, date of birth, ID band Patient awake    Reviewed: Allergy & Precautions, H&P , NPO status , Patient's Chart, lab work & pertinent test results, reviewed documented beta blocker date and time   Airway Mallampati: II   Neck ROM: full    Dental  (+) Poor Dentition   Pulmonary neg pulmonary ROS,    Pulmonary exam normal        Cardiovascular Exercise Tolerance: Poor hypertension, On Medications +CHF  Atrial Fibrillation  Rhythm:regular Rate:Normal     Neuro/Psych negative neurological ROS  negative psych ROS   GI/Hepatic negative GI ROS, Neg liver ROS,   Endo/Other  negative endocrine ROS  Renal/GU negative Renal ROS  negative genitourinary   Musculoskeletal   Abdominal   Peds  Hematology  (+) Blood dyscrasia, anemia ,   Anesthesia Other Findings Past Medical History: No date: Anemia No date: Atrial fibrillation, permanent     Comment:  on Xarelto No date: Bilateral lower extremity edema 11/11/2017: Bilateral lower leg cellulitis No date: Cancer [facility])     Comment:  breast cancer at age 3 and skin No date: Cellulitis     Comment:  lower abdomen No date: CHF (congestive heart failure) ([place]) 01/11/2018: Diarrhea No date: Dysrhythmia     Comment:  a-fib No date: Hyperlipidemia No date: Hypertension No date: Hypertension No date: Incontinence No date: Low sodium levels No date: Macular degeneration 11/23/2017: Malnutrition of moderate degree Past Surgical History: No date: BREAST SURGERY     Comment:  right mastectomy No date: EYE SURGERY     Comment:  x3 09/17/2017: IR RADIOLOGIST EVAL & MGMT No date: MASTECTOMY     Comment:  right No date: SHOULDER OPEN ROTATOR CUFF REPAIR     Comment:  left No date: SKIN BIOPSY     Comment:  skin cancer   Reproductive/Obstetrics negative OB ROS                             Anesthesia  Physical Anesthesia Plan  ASA: III  Anesthesia Plan: General   Post-op Pain Management:    Induction:   PONV Risk Score and Plan:   Airway Management Planned:   Additional Equipment:   Intra-op Plan:   Post-operative Plan:   Informed Consent: I have reviewed the patients History and Physical, chart, labs and discussed the procedure including the risks, benefits and alternatives for the proposed anesthesia with the patient or authorized representative who has indicated his/her understanding and acceptance.   Dental Advisory Given  Plan Discussed with: CRNA  Anesthesia Plan Comments:         Anesthesia Quick Evaluation

## 2018-05-07 ENCOUNTER — Telehealth: Payer: Self-pay | Admitting: Gastroenterology

## 2018-05-07 NOTE — Telephone Encounter (Signed)
Spoke with patient's daughter and she will call PCP in the morning (05/08/18) to test for possible UTI

## 2018-05-07 NOTE — Telephone Encounter (Signed)
PT  is calling  Again about prev. Message she wants to know  if her inplant couls cause her  Urination more frequent she has to change pads every 20 minutes is that normal please call pt cb [phone] ok to leave message  If not called before 2:30 pt has another apt

## 2018-05-07 NOTE — Anesthesia Postprocedure Evaluation (Signed)
Anesthesia Post Note  Patient: Heather Larson  Procedure(s) Performed: FECAL TRANSPLANT (N/A )  Patient location during evaluation: PACU Anesthesia Type: General Level of consciousness: awake and alert Pain management: pain level controlled Vital Signs Assessment: post-procedure vital signs reviewed and stable Respiratory status: spontaneous breathing, nonlabored ventilation, respiratory function stable and patient connected to nasal cannula oxygen Cardiovascular status: blood pressure returned to baseline and stable Postop Assessment: no apparent nausea or vomiting Anesthetic complications: no     Last Vitals:  Vitals:   05/03/18 1208 05/03/18 1228  BP: (!) 101/56 (!) (P) 168/70  Pulse: (!) 58   Resp: 18   Temp: (!) 36.2 C   SpO2: 99%     Last Pain:  Vitals:   05/03/18 1218  TempSrc:   PainSc: (P) 0-No pain                 Molli Barrows

## 2018-05-07 NOTE — Telephone Encounter (Signed)
Pt left vm to speak with Dr. Verlin Grills nurse regarding her recent procedure she had on Friday

## 2018-05-07 NOTE — Telephone Encounter (Signed)
T[place], Dr. Bonna Gains said that this should not affect her urinary freqency but to let Dr. Marius Ditch know as to this as she may want to check her urine in clinic or send her to her PCP. Please check with Dr. Marius Ditch and contact pt.   Thank you.

## 2018-05-08 ENCOUNTER — Ambulatory Visit (INDEPENDENT_AMBULATORY_CARE_PROVIDER_SITE_OTHER)
Admission: RE | Admit: 2018-05-08 | Discharge: 2018-05-08 | Disposition: A | Discharge status: Home / Self Care | Payer: Medicare HMO | Source: Ambulatory Visit | Attending: Primary Care | Admitting: Primary Care

## 2018-05-08 ENCOUNTER — Other Ambulatory Visit: Payer: Self-pay

## 2018-05-08 ENCOUNTER — Ambulatory Visit (INDEPENDENT_AMBULATORY_CARE_PROVIDER_SITE_OTHER): Payer: Medicare HMO | Admitting: Primary Care

## 2018-05-08 ENCOUNTER — Other Ambulatory Visit: Payer: 59

## 2018-05-08 ENCOUNTER — Encounter: Payer: Self-pay | Admitting: Primary Care

## 2018-05-08 ENCOUNTER — Telehealth: Payer: Self-pay | Admitting: Primary Care

## 2018-05-08 ENCOUNTER — Emergency Department: Payer: Medicare HMO

## 2018-05-08 ENCOUNTER — Inpatient Hospital Stay
Admission: EM | Admit: 2018-05-08 | Discharge: 2018-05-12 | DRG: 872 | Disposition: A | Discharge status: Home Health | Payer: Medicare HMO | Attending: Internal Medicine | Admitting: Internal Medicine

## 2018-05-08 ENCOUNTER — Inpatient Hospital Stay: Payer: Medicare HMO

## 2018-05-08 ENCOUNTER — Encounter: Payer: Self-pay | Admitting: Emergency Medicine

## 2018-05-08 VITALS — BP 142/72 | HR 100 | Temp 99.6°F | Ht 62.0 in | Wt 134.5 lb

## 2018-05-08 DIAGNOSIS — Z8619 Personal history of other infectious and parasitic diseases: Secondary | ICD-10-CM | POA: Diagnosis not present

## 2018-05-08 DIAGNOSIS — L03311 Cellulitis of abdominal wall: Secondary | ICD-10-CM | POA: Diagnosis present

## 2018-05-08 DIAGNOSIS — E871 Hypo-osmolality and hyponatremia: Secondary | ICD-10-CM | POA: Diagnosis present

## 2018-05-08 DIAGNOSIS — I5032 Chronic diastolic (congestive) heart failure: Secondary | ICD-10-CM

## 2018-05-08 DIAGNOSIS — E785 Hyperlipidemia, unspecified: Secondary | ICD-10-CM | POA: Diagnosis present

## 2018-05-08 DIAGNOSIS — R740 Nonspecific elevation of levels of transaminase and lactic acid dehydrogenase [LDH]: Secondary | ICD-10-CM | POA: Diagnosis not present

## 2018-05-08 DIAGNOSIS — A419 Sepsis, unspecified organism: Secondary | ICD-10-CM | POA: Diagnosis present

## 2018-05-08 DIAGNOSIS — G8929 Other chronic pain: Secondary | ICD-10-CM | POA: Diagnosis present

## 2018-05-08 DIAGNOSIS — I82409 Acute embolism and thrombosis of unspecified deep veins of unspecified lower extremity: Secondary | ICD-10-CM

## 2018-05-08 DIAGNOSIS — L03116 Cellulitis of left lower limb: Secondary | ICD-10-CM | POA: Diagnosis present

## 2018-05-08 DIAGNOSIS — Z9011 Acquired absence of right breast and nipple: Secondary | ICD-10-CM | POA: Diagnosis not present

## 2018-05-08 DIAGNOSIS — D72829 Elevated white blood cell count, unspecified: Secondary | ICD-10-CM | POA: Diagnosis not present

## 2018-05-08 DIAGNOSIS — I4891 Unspecified atrial fibrillation: Secondary | ICD-10-CM | POA: Diagnosis not present

## 2018-05-08 DIAGNOSIS — B954 Other streptococcus as the cause of diseases classified elsewhere: Secondary | ICD-10-CM | POA: Diagnosis present

## 2018-05-08 DIAGNOSIS — Z22322 Carrier or suspected carrier of Methicillin resistant Staphylococcus aureus: Secondary | ICD-10-CM | POA: Diagnosis not present

## 2018-05-08 DIAGNOSIS — Z853 Personal history of malignant neoplasm of breast: Secondary | ICD-10-CM

## 2018-05-08 DIAGNOSIS — I4821 Permanent atrial fibrillation: Secondary | ICD-10-CM | POA: Diagnosis present

## 2018-05-08 DIAGNOSIS — Z85828 Personal history of other malignant neoplasm of skin: Secondary | ICD-10-CM

## 2018-05-08 DIAGNOSIS — R7989 Other specified abnormal findings of blood chemistry: Secondary | ICD-10-CM

## 2018-05-08 DIAGNOSIS — Z7901 Long term (current) use of anticoagulants: Secondary | ICD-10-CM | POA: Diagnosis not present

## 2018-05-08 DIAGNOSIS — R509 Fever, unspecified: Secondary | ICD-10-CM

## 2018-05-08 DIAGNOSIS — R05 Cough: Secondary | ICD-10-CM | POA: Diagnosis not present

## 2018-05-08 DIAGNOSIS — J811 Chronic pulmonary edema: Secondary | ICD-10-CM | POA: Diagnosis not present

## 2018-05-08 DIAGNOSIS — L03818 Cellulitis of other sites: Secondary | ICD-10-CM

## 2018-05-08 DIAGNOSIS — Z79899 Other long term (current) drug therapy: Secondary | ICD-10-CM

## 2018-05-08 DIAGNOSIS — M810 Age-related osteoporosis without current pathological fracture: Secondary | ICD-10-CM | POA: Diagnosis present

## 2018-05-08 DIAGNOSIS — L03119 Cellulitis of unspecified part of limb: Secondary | ICD-10-CM | POA: Diagnosis present

## 2018-05-08 DIAGNOSIS — I110 Hypertensive heart disease with heart failure: Secondary | ICD-10-CM | POA: Diagnosis present

## 2018-05-08 DIAGNOSIS — J90 Pleural effusion, not elsewhere classified: Secondary | ICD-10-CM | POA: Diagnosis not present

## 2018-05-08 DIAGNOSIS — L03115 Cellulitis of right lower limb: Secondary | ICD-10-CM | POA: Diagnosis present

## 2018-05-08 DIAGNOSIS — A0471 Enterocolitis due to Clostridium difficile, recurrent: Secondary | ICD-10-CM | POA: Diagnosis not present

## 2018-05-08 DIAGNOSIS — R600 Localized edema: Secondary | ICD-10-CM | POA: Diagnosis not present

## 2018-05-08 DIAGNOSIS — Z66 Do not resuscitate: Secondary | ICD-10-CM | POA: Diagnosis present

## 2018-05-08 DIAGNOSIS — H3530 Unspecified macular degeneration: Secondary | ICD-10-CM | POA: Diagnosis present

## 2018-05-08 DIAGNOSIS — D729 Disorder of white blood cells, unspecified: Secondary | ICD-10-CM | POA: Diagnosis not present

## 2018-05-08 DIAGNOSIS — R059 Cough, unspecified: Secondary | ICD-10-CM

## 2018-05-08 DIAGNOSIS — L0390 Cellulitis, unspecified: Secondary | ICD-10-CM | POA: Diagnosis not present

## 2018-05-08 DIAGNOSIS — M7989 Other specified soft tissue disorders: Secondary | ICD-10-CM | POA: Diagnosis not present

## 2018-05-08 DIAGNOSIS — R0602 Shortness of breath: Secondary | ICD-10-CM

## 2018-05-08 DIAGNOSIS — H4089 Other specified glaucoma: Secondary | ICD-10-CM | POA: Diagnosis present

## 2018-05-08 HISTORY — DX: Sepsis, unspecified organism: A41.9

## 2018-05-08 LAB — CBC
HCT: 37.3 % (ref 36.0–46.0)
Hemoglobin: 12.1 g/dL (ref 12.0–15.0)
MCH: 27 pg (ref 26.0–34.0)
MCHC: 32.4 g/dL (ref 30.0–36.0)
MCV: 83.3 fL (ref 80.0–100.0)
Platelets: 353 10*3/uL (ref 150–400)
RBC: 4.48 MIL/uL (ref 3.87–5.11)
RDW: 16.5 % — ABNORMAL HIGH (ref 11.5–15.5)
WBC: 28.4 10*3/uL — AB (ref 4.0–10.5)
nRBC: 0 % (ref 0.0–0.2)

## 2018-05-08 LAB — BASIC METABOLIC PANEL
Anion gap: 11 (ref 5–15)
BUN: 21 mg/dL (ref 6–23)
BUN: 20 mg/dL (ref 8–23)
CO2: 24 mEq/L (ref 19–32)
CO2: 22 mmol/L (ref 22–32)
Calcium: 9.7 mg/dL (ref 8.4–10.5)
Calcium: 9 mg/dL (ref 8.9–10.3)
Chloride: 96 mEq/L (ref 96–112)
Chloride: 97 mmol/L — ABNORMAL LOW (ref 98–111)
Creatinine, Ser: 0.78 mg/dL (ref 0.40–1.20)
Creatinine, Ser: 0.78 mg/dL (ref 0.44–1.00)
GFR calc Af Amer: >60 mL/min (ref >60)
GFR calc non Af Amer: >60 mL/min (ref >60)
GFR: 73.26 mL/min (ref >60.00)
GLUCOSE: 126 mg/dL — AB (ref 70–99)
Glucose, Bld: 126 mg/dL — ABNORMAL HIGH (ref 70–99)
Potassium: 4.2 mEq/L (ref 3.5–5.1)
Potassium: 3.6 mmol/L (ref 3.5–5.1)
Sodium: 131 mEq/L — ABNORMAL LOW (ref 135–145)
Sodium: 130 mmol/L — ABNORMAL LOW (ref 135–145)

## 2018-05-08 LAB — URINALYSIS, COMPLETE (UACMP) WITH MICROSCOPIC
Bacteria, UA: NONE SEEN
Bilirubin Urine: NEGATIVE
Glucose, UA: NEGATIVE mg/dL
KETONES UR: NEGATIVE mg/dL
LEUKOCYTES UA: NEGATIVE
Nitrite: NEGATIVE
Protein, ur: 100 mg/dL — AB
Specific Gravity, Urine: 1.028 (ref 1.005–1.030)
pH: 5 (ref 5.0–8.0)

## 2018-05-08 LAB — CG4 I-STAT (LACTIC ACID): Lactic Acid, Venous: 2.37 mmol/L (ref 0.5–1.9)

## 2018-05-08 LAB — POC URINALSYSI DIPSTICK (AUTOMATED)
BILIRUBIN UA: NEGATIVE
Glucose, UA: NEGATIVE
Ketones, UA: NEGATIVE
LEUKOCYTES UA: NEGATIVE
Nitrite, UA: NEGATIVE
Protein, UA: POSITIVE — AB
Spec Grav, UA: 1.015 (ref 1.010–1.025)
Urobilinogen, UA: 0.2 E.U./dL
pH, UA: 6 (ref 5.0–8.0)

## 2018-05-08 LAB — CBC WITH DIFFERENTIAL/PLATELET
Basophils Absolute: 0.1 10*3/uL (ref 0.0–0.1)
Basophils Relative: 0.2 % (ref 0.0–3.0)
Eosinophils Absolute: 0 10*3/uL (ref 0.0–0.7)
Eosinophils Relative: 0 % (ref 0.0–5.0)
HCT: 37.8 % (ref 36.0–46.0)
HEMOGLOBIN: 12.1 g/dL (ref 12.0–15.0)
Lymphocytes Relative: 2.7 % — ABNORMAL LOW (ref 12.0–46.0)
Lymphs Abs: 0.9 10*3/uL (ref 0.7–4.0)
MCHC: 32 g/dL (ref 30.0–36.0)
MCV: 83.1 fl (ref 78.0–100.0)
Monocytes Absolute: 0.4 10*3/uL (ref 0.1–1.0)
Monocytes Relative: 1.3 % — ABNORMAL LOW (ref 3.0–12.0)
Neutro Abs: 30.2 10*3/uL — ABNORMAL HIGH (ref 1.4–7.7)
Neutrophils Relative %: 95.8 % — ABNORMAL HIGH (ref 43.0–77.0)
Platelets: 398 10*3/uL (ref 150.0–400.0)
RBC: 4.55 Mil/uL (ref 3.87–5.11)
RDW: 16.7 % — ABNORMAL HIGH (ref 11.5–15.5)

## 2018-05-08 LAB — TROPONIN I: Troponin I: 0.04 ng/mL (ref <0.03)

## 2018-05-08 LAB — LACTIC ACID, PLASMA
Lactic Acid, Venous: 2.6 mmol/L (ref 0.5–1.9)
Lactic Acid, Venous: 1.7 mmol/L (ref 0.5–1.9)

## 2018-05-08 LAB — POC INFLUENZA A&B (BINAX/QUICKVUE)
Influenza A, POC: NEGATIVE
Influenza B, POC: NEGATIVE

## 2018-05-08 MED ORDER — MORPHINE SULFATE (PF) 2 MG/ML IV SOLN
2.0000 mg | INTRAVENOUS | Status: DC | PRN
  Administered 2018-05-08: 2 mg via INTRAVENOUS

## 2018-05-08 MED ORDER — MORPHINE SULFATE (PF) 2 MG/ML IV SOLN
INTRAVENOUS | Status: AC
  Administered 2018-05-08: 2 mg via INTRAVENOUS
  Filled 2018-05-08: qty 1

## 2018-05-08 MED ORDER — VANCOMYCIN HCL IN DEXTROSE 1-5 GM/200ML-% IV SOLN
1000.0000 mg | Freq: Once | INTRAVENOUS | Status: AC
  Administered 2018-05-08: 1000 mg via INTRAVENOUS
  Filled 2018-05-08: qty 200

## 2018-05-08 MED ORDER — VANCOMYCIN HCL IN DEXTROSE 750-5 MG/150ML-% IV SOLN
750.0000 mg | INTRAVENOUS | Status: DC
  Filled 2018-05-08: qty 150

## 2018-05-08 MED ORDER — PIPERACILLIN-TAZOBACTAM 3.375 G IVPB
3.3750 g | Freq: Three times a day (TID) | INTRAVENOUS | Status: DC
  Administered 2018-05-09 (×2): 3.375 g via INTRAVENOUS
  Filled 2018-05-08 (×2): qty 50

## 2018-05-08 MED ORDER — SODIUM CHLORIDE 0.9 % IV BOLUS
500.0000 mL | Freq: Once | INTRAVENOUS | Status: AC
  Administered 2018-05-08: 500 mL via INTRAVENOUS

## 2018-05-08 MED ORDER — VANCOMYCIN HCL 500 MG IV SOLR
500.0000 mg | Freq: Once | INTRAVENOUS | Status: AC
  Administered 2018-05-08: 500 mg via INTRAVENOUS
  Filled 2018-05-08: qty 500

## 2018-05-08 MED ORDER — PIPERACILLIN-TAZOBACTAM 3.375 G IVPB 30 MIN
3.3750 g | Freq: Once | INTRAVENOUS | Status: AC
  Administered 2018-05-08: 3.375 g via INTRAVENOUS
  Filled 2018-05-08: qty 50

## 2018-05-08 NOTE — ED Triage Notes (Signed)
Pt sent by MD for cellulitis, fever, dehydration and weakness.

## 2018-05-08 NOTE — ED Notes (Signed)
Pt states was seen at PCP today had labs drawn and chest xray, had fecal transplant this past Friday but now has redness across lower abd and states left thigh.  Denies diarrhea.

## 2018-05-08 NOTE — ED Notes (Signed)
Report called to floor

## 2018-05-08 NOTE — Telephone Encounter (Signed)
[facility] called with critical results. WBC 31.6

## 2018-05-08 NOTE — ED Notes (Signed)
Nurse spoke with house supervisor Elmo Putt about pt's bed placement. Dr. Estanislado Pandy notified of pt's decreased lactic acid.

## 2018-05-08 NOTE — Assessment & Plan Note (Signed)
S/P fecal transplant on 05/03/18. Symptoms today don't seem to be complications from fecal transplant. Will cc note to GI.

## 2018-05-08 NOTE — ED Notes (Signed)
Nurse updated pt's daughter about plan with pt. Pt is currently sleeping and in NAD. VS have improved from initial.

## 2018-05-08 NOTE — Consult Note (Signed)
Subjective:   CC: cellulitis  HPI:  Heather Larson is a 85 y.o. female who was consulted by [name] for evaluation of  above. First noted 1 day ago by daughter and caretaker.  Symptoms include:  Sharp pain at the sites of concern, with progression of erythema since initial presentation.  Per daughter, who is also the POA, she has chronic swelling and discoloration in her pelvis and BLE but the amount of swelling has also increased along with the erythema.     Past Medical History:  has a past medical history of Anemia, Atrial fibrillation, permanent, Bilateral lower extremity edema, Bilateral lower leg cellulitis (11/11/2017), Cancer ([place]), Cellulitis, CHF (congestive heart failure) ([place]), Diarrhea (01/11/2018), Dysrhythmia, Hyperlipidemia, Hypertension, Hypertension, Incontinence, Low sodium levels, Macular degeneration, and Malnutrition of moderate degree (11/23/2017).  Past Surgical History:  has a past surgical history that includes Breast surgery; Mastectomy; Eye surgery; Skin biopsy; Shoulder open rotator cuff repair; IR Radiologist Eval & Mgmt (09/17/2017); and Fecal transplant (N/A, 05/03/2018).  Family History: reviewed and not related to CC  Social History:  reports that she has never smoked. She has never used smokeless tobacco. She reports previous alcohol use of about 2.0 standard drinks of alcohol per week. She reports that she does not use drugs.  Current Medications:  acidophilus (RISAQUAD) CAPS capsule Take 2 capsules by mouth 3 (three) times daily. [name], MD Reordered   Patient taking differently: Take 1 capsule by mouth daily.     Ordered as: acidophilus (RISAQUAD) capsule 1 capsule - 1 capsule, Oral, Daily, First dose on Wed 05/08/18 at 1600  albuterol (PROVENTIL HFA;VENTOLIN HFA) 108 (90 Base) MCG/ACT inhaler Inhale 2 puffs into the lungs every 6 (six) hours as needed for wheezing or shortness of breath. [name], MD Reordered  Ordered as: albuterol (PROVENTIL  HFA;VENTOLIN HFA) 108 (90 Base) MCG/ACT inhaler 2 puff - 2 puff, Inhalation, Every 6 hours PRN, wheezing, shortness of breath, Starting Wed 05/08/18 at 1557  atorvastatin (LIPITOR) 10 MG tablet Take 1 tablet (10 mg total) by mouth daily. [name], MD Reordered  Ordered as: atorvastatin (LIPITOR) tablet 10 mg - 10 mg, Oral, Daily, First dose on Wed 05/08/18 at 1600  brimonidine (ALPHAGAN) 0.2 % ophthalmic solution Place 1 drop into both eyes 3 (three) times daily. [name], MD Reordered  Ordered as: brimonidine (ALPHAGAN) 0.2 % ophthalmic solution 1 drop - 1 drop, Both Eyes, 3 times daily, First dose on Wed 05/08/18 at 1600  calcium carbonate (OSCAL) 1500 (600 Ca) MG TABS tablet Take by mouth 2 (two) times daily with a meal. [name], [name], MD Reordered  Ordered as: calcium carbonate (OSCAL) tablet 1,500 mg - 1,500 mg (600 mg of elemental calcium), Oral, 2 times daily with meals, First dose on Wed 05/08/18 at 1700  dorzolamide (TRUSOPT) 2 % ophthalmic solution Place 1 drop into both eyes 3 (three) times daily. [name], MD Reordered  Ordered as: dorzolamide (TRUSOPT) 2 % ophthalmic solution 1 drop - 1 drop, Both Eyes, 3 times daily, First dose on Wed 05/08/18 at 1600  furosemide (LASIX) 20 MG tablet TAKE 1 TABLET(20 MG) BY MOUTH EVERY OTHER DAY [name], MD Not Ordered   Patient taking differently: Take 20 mg by mouth daily.     metoprolol succinate (TOPROL-XL) 100 MG 24 hr tablet Take 1 tablet (100 mg total) by mouth daily. Take with or immediately following a meal. [name], [name], MD Reordered  Ordered as: metoprolol succinate (TOPROL-XL) 24 hr tablet 100 mg - 100  mg, Oral, Daily, First dose on Wed 05/08/18 at 1600  Multiple Vitamins-Minerals (MULTIVITAMIN ADULT) TABS Take 1 tablet by mouth daily. [name], MD Reordered  Ordered as: MULTIVITAMIN ADULT TABS 1 tablet - 1 tablet, Oral, Daily, First dose on Wed 05/08/18 at 1600  pantoprazole (PROTONIX) 40 MG tablet Take 1 tablet (40 mg  total) by mouth daily. [name], MD Reordered  Ordered as: pantoprazole (PROTONIX) EC tablet 40 mg - 40 mg, Oral, Daily, First dose on Wed 05/08/18 at 1600  Rivaroxaban (XARELTO) 15 MG TABS tablet Take 1 tablet (15 mg total) by mouth daily. [name], MD Reordered  Ordered as: Rivaroxaban (XARELTO) tablet 15 mg - Use ACTUAL BODY WEIGHT to determine CrCl for rivaroxaban dosing.  Consider alternative anticoagulant if CrCl < 30 mL/min. For Atrial Fib indication, reduce dose for CrCl 15 - 50 mL/min.  May administer via G-Tubes. Do NOT give via J-Tube due to significant decrease in absorption.  15 mg, Oral, Daily, First dose on Wed 05/08/18 at 1600  TRAVATAN Z 0.004 % SOLN ophthalmic solution Place 1 drop into both eyes at bedtime.  [name], MD Reordered  Ordered as: latanoprost (XALATAN) 0.005 % ophthalmic solution 1 drop - 1 drop, Both Eyes, Daily at bedtime, First dose on Wed 05/08/18 at 2200  vitamin C (ASCORBIC ACID) 500 MG tablet Take 500 mg by mouth daily. [name], MD Reordered  Ordered as: vitamin C (ASCORBIC ACID) tablet 500 mg - 500 mg, Oral, Daily, First dose on Wed 05/08/18 at 1600  Nutritional Supplements (NUTRITIONAL SHAKE PLUS PROTEIN PO)        Allergies:  No Known Allergies  ROS:  A 15 point review of systems was unable to be performed secondary to patient mentation   Objective:     BP 104/75   Pulse (!) 126   Temp 98.9 F (37.2 C)   Resp (!) 23   Ht 5\' 2"  (1.575 m)   Wt 60.8 kg   SpO2 96%   BMI 24.51 kg/m   Constitutional :  alert, cooperative, appears stated age and moderate distress  Lymphatics/Throat:  no asymmetry, masses, or scars  Respiratory:  clear to auscultation bilaterally  Cardiovascular:  irregularly irregular rhythm  Gastrointestinal: increased erythema and warmth in the distribution of her depends diaper in the pelvis region, with increased pitting edema and tenderness.  no palpable crepitus on exam.  .  Musculoskeletal: Steady  gait and movement  Skin:  In addition to the areas noted in the GI exam, extensive erythema and pitting edema with slight tenderness noted in bilateral lower extremities.  Lower extremity includes the entire lower leg with spreading erythema into the medial aspect of her thigh.  Right lower extremity has does not have much involvement of the lower leg but also noted to have some erythema on the medial thigh as well.  [place] the the distribution of the redness is consistent with the area where she has constant pressure applied when she is laying in bed straight with her legs together.  No obvious erythema noted on the posterior aspects of the legs nor on her backside.  Psychiatric: Normal affect, non-agitated, not confused       LABS:  CMP Latest Ref Rng & Units 05/08/2018 05/08/2018 03/27/2018  Glucose 70 - 99 mg/dL 126(H) 126(H) -  BUN 8 - 23 mg/dL 20 21 -  Creatinine 0.44 - 1.00 mg/dL 0.78 0.78 -  Sodium 135 - 145 mmol/L 130(L) 131(L) -  Potassium 3.5 -  5.1 mmol/L 3.6 4.2 4.2  Chloride 98 - 111 mmol/L 97(L) 96 -  CO2 22 - 32 mmol/L 22 24 -  Calcium 8.9 - 10.3 mg/dL 9.0 9.7 -  Total Protein 6.5 - 8.1 g/dL - - -  Total Bilirubin 0.3 - 1.2 mg/dL - - -  Alkaline Phos 38 - 126 U/L - - -  AST 15 - 41 U/L - - -  ALT 0 - 44 U/L - - -   CBC Latest Ref Rng & Units 05/08/2018 05/08/2018 03/26/2018  WBC 4.0 - 10.5 K/uL 28.4(H) 31.6 Repeated and verified X2.(HH) 9.6  Hemoglobin 12.0 - 15.0 g/dL 12.1 12.1 10.8(L)  Hematocrit 36.0 - 46.0 % 37.3 37.8 32.2(L)  Platelets 150 - 400 K/uL 353 398.0 431    RADS: CLINICAL DATA:  Cellulitis of the left lower leg. Redness and swelling. Tenderness to touch.  EXAM: LEFT TIBIA AND FIBULA - 2 VIEW  COMPARISON:  None.  FINDINGS: The tibia and fibula appear normal. No appreciable arthritic changes at the knee or ankle.  Nonspecific subcutaneous edema seen throughout the lower leg. Arterial vascular calcifications.  IMPRESSION: Soft tissue edema.  Otherwise, normal exam.   Electronically Signed   By: Lorriane Shire M.D.   On: 05/08/2018 17:15   Assessment:      Cellulitis of pelvis and BLE  Plan:     Symptom onset and the rapid progression of the disease is concerning.  However the distribution of the erythema and areas of cellulitis is consistent with the dependent portions of her chronic pitting edema previously reported, and the patchy nature of the erythema also makes a advancing necrotizing fasciitis less likely.  Patient does have a history of cellulitis in the past with a similar appearance per patient's daughter.    At this point, continuing IV antibiotic therapy and close observation will be the safest approach, especially with her advanced age multiple medical issues including the use of Xarelto making her at a very high risk of surgical complications.  Anticipated extensive wound care and treatment postop if this require surgical intervention will also make her mortality rate extremely high as well.  She was recently placed on DNR status as well, and when I briefly discussed with the daughter how the DNR status is temporarily placed on hold during operative intervention, specifically stated that she will need to have a discussion with her sister prior to agreeing to any sort of surgery.  We will continue to monitor very closely at this time.

## 2018-05-08 NOTE — ED Notes (Signed)
Lactic 2.6, provider will be notified

## 2018-05-08 NOTE — Assessment & Plan Note (Signed)
Daily weights are stable. Chest xray today with stable mild vascular congestion.  No CHF exacerbation.

## 2018-05-08 NOTE — ED Notes (Signed)
Pt is sleeping at this time and in NAD

## 2018-05-08 NOTE — Progress Notes (Signed)
Pharmacy Antibiotic Note  Risha Barretta is a 85 y.o. female admitted on 05/08/2018 with sepsis and wound infection.  Pharmacy has been consulted for pip/tazo and cefepime dosing.  Plan: Pt received vancomycin 1000 mg x 1 in the ED. Will give an extra 500 mg of vancomycin to make a total of 1500 mg (~25 mg/kg). Will start a maintenance dose of vancomycin 750 mg q24H. Predicted AUC level 417. Goal AUC is 400-550. Plan to obtain vancomycin levels at the 4th-5th dose. Scr 0.78- stable.   Vd43.8, Ke 0.041, t1/2 16.9  Will start pip/tazo 3.375 q8H.   Height: 5\' 2"  (157.5 cm) Weight: 134 lb (60.8 kg) IBW/kg (Calculated) : 50.1  Temp (24hrs), Avg:98.9 F (37.2 C), Min:98.2 F (36.8 C), Max:99.6 F (37.6 C)  Recent Labs  Lab 05/08/18 1217 05/08/18 1326 05/08/18 1332  WBC 31.6 Repeated and verified X2.* 28.4*  --   CREATININE 0.78 0.78  --   LATICACIDVEN  --   --  2.37*    Estimated Creatinine Clearance: 38.5 mL/min (by C-G formula based on SCr of 0.78 mg/dL).    No Known Allergies  Antimicrobials this admission: 1/8 vancomycin >>  1/8 pip/tazo >>   Dose adjustments this admission: none  Microbiology results: None  Thank you for allowing pharmacy to be a part of this patient's care.  Oswald Hillock, PharmD, BCPS Clinical Pharmacist 05/08/2018 4:15 PM

## 2018-05-08 NOTE — ED Provider Notes (Addendum)
[facility] Emergency Department Provider Note  ____________________________________________   I have reviewed the triage vital signs and the nursing notes. Where available I have reviewed prior notes and, if possible and indicated, outside hospital notes.    HISTORY  Chief Complaint No chief complaint on file.    HPI Heather Larson is a 85 y.o. female   resents today complaining of cellulitis.  Patient does have a history of recurrent cellulitis, chronic bilateral lower extremity edema, atrial fibrillation, she has a history of C. 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  Patient here with her daughter who is POA.  They state they have noticed over the last 24 hours cellulitis which is stretching from the left lower extremity up to her abdominal region.  She was seen by home nursing today.  They noticed this illness and found her to be looking poorly, and sent her in.  Patient has had nausea but no vomiting.  She is had low-grade fevers. According to them, she had negative flu, and urinary tract infection was not seen on UA.  Patient does have a history of elevated white blood cell counts getting up into the 40s in the past, in any event, she has had low-grade fevers but no high fevers, and the cellulitis seems to be rapidly progressing.  She does have chronic edema in her legs that has not changed.  No other alleviating or aggravating symptoms no other prior treatment no other complaints.  Significant redness and heat noted from her left lower extremity     Past Medical History:  Diagnosis Date  . Anemia   . Atrial fibrillation, permanent    on Xarelto  . Bilateral lower extremity edema   . Bilateral lower leg cellulitis 11/11/2017  . Cancer [facility])    breast cancer at age 14 and skin  . Cellulitis    lower abdomen  . CHF (congestive heart failure) ([place])   . Diarrhea 01/11/2018  . Dysrhythmia    a-fib  . Hyperlipidemia   . Hypertension   .  Hypertension   . Incontinence   . Low sodium levels   . Macular degeneration   . Malnutrition of moderate degree 11/23/2017    Patient Active Problem List   Diagnosis Date Noted  . Chronic diastolic heart failure ([place]) 03/13/2018  . Anemia 03/13/2018  . Lymphedema 03/13/2018  . Acute on chronic diastolic heart failure ([place])   . Recurrent colitis due to Clostridioides difficile   . Hyponatremia 08/06/2017  . Bilateral lower extremity edema 07/31/2017  . Macular degeneration 06/15/2017  . Chronic atrial fibrillation 06/15/2017  . Essential hypertension 06/15/2017  . Urinary incontinence 06/15/2017  . Osteoporosis 06/15/2017  . Hyperlipemia 06/15/2017  . Glaucoma of both eyes 10/08/2014  . Cellulitis 09/29/2014  . Personal history of other malignant neoplasm of skin 05/13/2014  . Closed head injury 10/28/2013  . Hand laceration 10/28/2013    Past Surgical History:  Procedure Laterality Date  . BREAST SURGERY     right mastectomy  . EYE SURGERY     x3  . 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 IR RADIOLOGIST EVAL & MGMT  09/17/2017  . MASTECTOMY     right  . SHOULDER OPEN ROTATOR CUFF REPAIR     left  . SKIN BIOPSY     skin cancer    Prior to Admission medications  Medication Sig Start Date End Date Taking? Authorizing Provider  acidophilus (RISAQUAD) CAPS capsule Take 2 capsules by mouth 3 (three) times daily. Patient taking differently: Take 1 capsule by mouth daily.  01/14/18   [name], MD  albuterol (PROVENTIL HFA;VENTOLIN HFA) 108 (90 Base) MCG/ACT inhaler Inhale 2 puffs into the lungs every 6 (six) hours as needed for wheezing or shortness of breath.    [provider]  atorvastatin (LIPITOR) 10 MG tablet Take 1 tablet (10 mg total) by mouth daily. 10/02/17   Minna Merritts, MD  brimonidine (ALPHAGAN) 0.2 %  ophthalmic solution Place 1 drop into both eyes 3 (three) times daily. 01/02/18   [provider]  calcium carbonate (OSCAL) 1500 (600 Ca) MG TABS tablet Take by mouth 2 (two) times daily with a meal.    [provider]  dorzolamide (TRUSOPT) 2 % ophthalmic solution Place 1 drop into both eyes 3 (three) times daily. 01/02/18   [provider]  furosemide (LASIX) 20 MG tablet TAKE 1 TABLET(20 MG) BY MOUTH EVERY OTHER DAY Patient taking differently: 20 mg daily.  04/29/18   Rise Mu, PA-C  metoprolol succinate (TOPROL-XL) 100 MG 24 hr tablet Take 1 tablet (100 mg total) by mouth daily. Take with or immediately following a meal. 03/26/18 06/24/18  Dunn, Areta Haber, PA-C  Multiple Vitamins-Minerals (MULTIVITAMIN ADULT) TABS Take 1 tablet by mouth daily.    [provider]  Nutritional Supplements (NUTRITIONAL SHAKE PLUS PROTEIN PO) Take by mouth.    [provider]  pantoprazole (PROTONIX) 40 MG tablet Take 1 tablet (40 mg total) by mouth daily. 03/09/18   Gladstone Lighter, MD  Rivaroxaban (XARELTO) 15 MG TABS tablet Take 1 tablet (15 mg total) by mouth daily. 10/02/17   Gollan, Kathlene November, MD  TRAVATAN Z 0.004 % SOLN ophthalmic solution Place 1 drop into both eyes at bedtime.  01/03/18   [provider]  vitamin C (ASCORBIC ACID) 500 MG tablet Take 500 mg by mouth daily.    [provider]    Allergies Patient has no known allergies.  History reviewed. No pertinent family history.  Social History Social History   Tobacco Use  . Smoking status: Never Smoker  . Smokeless tobacco: Never Used  Substance Use Topics  . Alcohol use: Not Currently    Alcohol/week: 2.0 standard drinks    Types: 2 Shots of liquor per week    Comment: 2 drinks daily  . Drug use: No    Review of Systems Constitutional: Positive for low-grade Eyes: No visual changes. ENT: No sore throat. No stiff neck no neck pain Cardiovascular: Denies chest  pain. Respiratory: Denies shortness of breath. Gastrointestinal:   no vomiting.  No diarrhea.  No constipation. Genitourinary: Negative for dysuria. Musculoskeletal: Negative lower extremity swelling Skin:  + for rash. Neurological: Negative for severe headaches, focal weakness or numbness.   ____________________________________________   PHYSICAL EXAM:  VITAL SIGNS: ED Triage Vitals  Enc Vitals Group     BP 05/08/18 1311 (!) 149/63     Pulse Rate 05/08/18 1311 99     Resp 05/08/18 1311 18     Temp 05/08/18 1311 98.2 F (36.8 C)     Temp Source 05/08/18 1311 Oral     SpO2 05/08/18 1311 96 %     Weight 05/08/18 1313 134 lb (60.8 kg)     Height 05/08/18 1313 5\' 2"  (1.575 m)     Head Circumference --  Peak Flow --      Pain Score 05/08/18 1312 8     Pain Loc --      Pain Edu? --      Excl. in [place]? --     Constitutional: Alert and oriented.  Acutely and chronically ill-appearing patient however speaking in full sentences.  Peers generally weak and unclear the acuity of that particular symptom Eyes: Conjunctivae are normal Head: Atraumatic HEENT: No congestion/rhinnorhea. Mucous membranes are moist.  Oropharynx non-erythematous Neck:   Nontender with no meningismus, no masses, no stridor Cardiovascular: ?irregularity grossly normal heart sounds.  Good peripheral circulation. Respiratory: Normal respiratory effort.  No retractions. Lungs slight bibasilar wheeze. Abdominal: Soft and nontender. No distention. No guarding no rebound Back:  There is no focal tenderness or step off.  there is no midline tenderness there are no lesions noted. there is no CVA tenderness  Musculoskeletal: No lower extremity tenderness, no upper extremity tenderness. No joint effusions, no DVT signs strong distal pulses significant bilateral symmetric pitting edema Neurologic:  Normal speech and language. No gross focal neurologic deficits are appreciated.  Skin:  Skin is warm, dry and intact.   Patient with significant erythema, circumferential going up the lower extremity, medially in the upper thighs and then transversely across her lower abdomen, these are confluent areas, they are blanchable.  There is no petechiae seen.  It is tender.  It is hot.  I do not palpate any crepitus. Psychiatric: Mood and affect are normal. Speech and behavior are normal.  ____________________________________________   LABS (all labs ordered are listed, but only abnormal results are displayed)  Labs Reviewed  CBC - Abnormal; Notable for the following components:      Result Value   WBC 28.4 (*)    RDW 16.5 (*)    All other components within normal limits  BASIC METABOLIC PANEL - Abnormal; Notable for the following components:   Sodium 130 (*)    Chloride 97 (*)    Glucose, Bld 126 (*)    All other components within normal limits  CG4 I-STAT (LACTIC ACID) - Abnormal; Notable for the following components:   Lactic Acid, Venous 2.37 (*)    All other components within normal limits  CULTURE, BLOOD (ROUTINE X 2)  CULTURE, BLOOD (ROUTINE X 2)  URINE CULTURE  URINALYSIS, COMPLETE (UACMP) WITH MICROSCOPIC  TROPONIN I  I-STAT CG4 LACTIC ACID, ED    Pertinent labs  results that were available during my care of the patient were reviewed by me and considered in my medical decision making (see chart for details). ____________________________________________  EKG  I personally interpreted any EKGs ordered by me or triage  ____________________________________________  RADIOLOGY  Pertinent labs & imaging results that were available during my care of the patient were reviewed by me and considered in my medical decision making (see chart for details). If possible, patient and/or family made aware of any abnormal findings.  Dg Chest 2 View  Result Date: 05/08/2018 CLINICAL DATA:  Cough and fevers EXAM: CHEST - 2 VIEW COMPARISON:  03/04/2018 FINDINGS: Cardiac shadow is enlarged in size but stable.  Stable mild vascular congestion is seen. Mild blunting of the costophrenic angles is again identified similar to that seen on the prior exam. Underlying atelectatic changes are noted as well. Compression deformities with prior augmentation are noted in the lower thoracic spine with increased kyphosis. No new focal abnormality is seen. IMPRESSION: Bibasilar atelectatic changes and small effusions. Stable mild vascular congestion. Electronically Signed  By: Inez Catalina M.D.   On: 05/08/2018 12:26   ____________________________________________    PROCEDURES  Procedure(s) performed: None  Procedures  Critical Care performed: CRITICAL CARE Performed by: Schuyler Amor   Total critical care time: 38 minutes  Critical care time was exclusive of separately billable procedures and treating other patients.  Critical care was necessary to treat or prevent imminent or life-threatening deterioration.  Critical care was time spent personally by me on the following activities: development of treatment plan with patient and/or surrogate as well as nursing, discussions with consultants, evaluation of patient's response to treatment, examination of patient, obtaining history from patient or surrogate, ordering and performing treatments and interventions, ordering and review of laboratory studies, ordering and review of radiographic studies, pulse oximetry and re-evaluation of patient's condition.   ____________________________________________   INITIAL IMPRESSION / ASSESSMENT AND PLAN / ED COURSE  Pertinent labs & imaging results that were available during my care of the patient were reviewed by me and considered in my medical decision making (see chart for details).  Patient here with significant cellulitis unclear how long it has been there but it was only noticed in the last 24 hours.  Unfortunately, we are going to have to give her very aggressive antibiotic treatment for this.  I have ordered  vancomycin and Zosyn.  We are getting blood cultures, her white count is markedly elevated, this is happened to her before.  I am concerned about several things with this patient.  Versus the aggressive nature of the cellulitis.  Necrotizing fasciitis is certainly possibility we will get an x-ray to see if there is crepitus although I do not palpate it. Of course patient's lactic is borderline elevated but her pressures are holding and she does have a history of atrial fibrillation with chronic lower extremity edema, given her slight wheeze, and history of 6 use we will hold off on being too aggressive on IV fluid especially given normal blood pressures.  However, lactic is noted.  I did discuss with the family about end-of-life care, should it be necessary, her daughter, who is at bedside, is the POA and states that they do not think that heroic measures would be something she would want.  This can be further elucidated as her hospital stay progresses.  I did talk to the hospitalist, they agree with management I appreciate consult, they will admit.  In addition, I am placing 2 IVs in the patient, and I will obtain an x-ray to see if there is any occult crepitus.  ----------------------------------------- 4:40 PM on 05/08/2018 -----------------------------------------  Evaluated by surgery in the ER. Pt is now DNR. He does not feel that this is [employer] and does not think if it were she would likely survive surgery. She has been admitted since my last note to the hospitalists.     ____________________________________________   FINAL CLINICAL IMPRESSION(S) / ED DIAGNOSES  Final diagnoses:  Cellulitis of other specified site  Lactic acid blood increased      This chart was dictated using voice recognition software.  Despite best efforts to proofread,  errors can occur which can change meaning.      Schuyler Amor, MD 05/08/18 1548    Schuyler Amor, MD 05/08/18 [id number]

## 2018-05-08 NOTE — Assessment & Plan Note (Signed)
Very likely cellulitis across bilateral lower abdomen and down left lower extremity. Labs ordered today. She appears weak and sickly. Given presentation, fevers, wheezing, profound weakness will send to emergency department for further evaluation.  She will likely need IV treatment.

## 2018-05-08 NOTE — Patient Instructions (Addendum)
Please go to the hospital now.   You could have a complication from your fecal transplant, you appear to have cellulitis to your skin that requires IV treatment.  You chest xray doesn't show pneumonia.  It was a pleasure to see you today!

## 2018-05-08 NOTE — Telephone Encounter (Signed)
Patient was sent to the emergency department after testing. Patient currently being admitted.

## 2018-05-08 NOTE — ED Notes (Signed)
Reported to [employer].

## 2018-05-08 NOTE — Progress Notes (Signed)
Advanced care plan. Purpose of the Encounter: CODE STATUS Parties in Attendance: Patient and family Patient's Decision Capacity: Good Subjective/Patient's story: Patient presented to emergency room following facility for swelling and redness in the left leg Objective/Medical story Patient has bad cellulitis of the left leg along with sepsis and elevated WBC count Lactic acid is elevated Needs broad-spectrum IV antibiotics Goals of care determination:  Advance care directives goals of care and treatment plan discussed Patient and family do not want any CPR, intubation ventilator if the need arises CODE STATUS: DNR Time spent discussing advanced care planning: 16 minutes

## 2018-05-08 NOTE — Progress Notes (Signed)
Subjective:    Patient ID: Heather Larson, female    DOB: Sep 19, 1923, 85 y.o.   MRN: [id number]  HPI  [name] is a 85 year old female who presents today with a chief complaint of cough.  She also reports chills, fatigue, urinary frequency, fever. Her symptoms began this morning. She was released from home health yesterday given improvements. She had normal vitals yesterday per the home health nurse. He daughter reports that she was perfectly fine, making jokes yesterday.  She underwent colonoscopy for fecal transplant due to recurrent c-diff on 05/03/18. Tolerated procedure well, no complications. Her daughter endorses that she had a major episode of diarrhea in the recovery room just after her fecal transplant. She's not sure if there was any contamination of feces to the urethra, the nurses were able to clean her up.  During the middle of the night last night she woke up with chills, increased weakness, some nausea, wheezing, erythema to her bilateral lower abdomen, bilateral lower abdomina discomfort, bilateral lower back pain. She's had firm bowel movements since Monday this week.   Her daughter called into GI's office yesterday with reports of urinary frequency with having to change pads every 20 minutes, this was deemed not to be a complication after fecal transplant so she was encouraged to see her PCP.  She's not had urinary frequency today. Her daughter gave her some albuterol prior to her visit now. She's been weighing herself at home daily and has been staying between 128-130 pounds. She's compliant to her Lasix as prescribed.  Wt Readings from Last 3 Encounters:  05/08/18 134 lb 8 oz (61 kg)  04/29/18 133 lb (60.3 kg)  04/16/18 132 lb 6 oz (60 kg)    BP Readings from Last 3 Encounters:  05/08/18 (!) 142/72  05/03/18 (!) (P) 168/70  04/29/18 140/60     Review of Systems  Constitutional: Positive for chills, fatigue and fever.  Respiratory: Positive for cough,  shortness of breath and wheezing.   Gastrointestinal: Positive for abdominal pain and nausea. Negative for constipation, diarrhea and vomiting.  Skin: Positive for color change.  Neurological: Positive for weakness.       Past Medical History:  Diagnosis Date  . Anemia   . Atrial fibrillation, permanent    on Xarelto  . Bilateral lower extremity edema   . Bilateral lower leg cellulitis 11/11/2017  . Cancer [facility])    breast cancer at age 89 and skin  . Cellulitis    lower abdomen  . CHF (congestive heart failure) ([place])   . Diarrhea 01/11/2018  . Dysrhythmia    a-fib  . Hyperlipidemia   . Hypertension   . Hypertension   . Incontinence   . Low sodium levels   . Macular degeneration   . Malnutrition of moderate degree 11/23/2017     Social History   Socioeconomic History  . Marital status: Divorced    Spouse name: Not on file  . Number of children: Not on file  . Years of education: Not on file  . Highest education level: Not on file  Occupational History  . Not on file  Social Needs  . Financial resource strain: Not on file  . Food insecurity:    Worry: Not on file    Inability: Not on file  . Transportation needs:    Medical: Not on file    Non-medical: Not on file  Tobacco Use  . Smoking status: Never Smoker  . Smokeless tobacco: Never Used  Substance and Sexual Activity  . Alcohol use: Not Currently    Alcohol/week: 2.0 standard drinks    Types: 2 Shots of liquor per week    Comment: 2 drinks daily  . Drug use: No  . Sexual activity: Not on file  Lifestyle  . Physical activity:    Days per week: Not on file    Minutes per session: Not on file  . Stress: Not on file  Relationships  . Social connections:    Talks on phone: Not on file    Gets together: Not on file    Attends religious service: Not on file    Active member of club or organization: Not on file    Attends meetings of clubs or organizations: Not on file    Relationship status: Not on file   . Intimate partner violence:    Fear of current or ex partner: Not on file    Emotionally abused: Not on file    Physically abused: Not on file    Forced sexual activity: Not on file  Other Topics Concern  . Not on file  Social History Narrative   Moved from [place].    Past Surgical History:  Procedure Laterality Date  . BREAST SURGERY     right mastectomy  . EYE SURGERY     x3  . 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 IR RADIOLOGIST EVAL & MGMT  09/17/2017  . MASTECTOMY     right  . SHOULDER OPEN ROTATOR CUFF REPAIR     left  . SKIN BIOPSY     skin cancer    No family history on file.  No Known Allergies  Current Outpatient Medications on File Prior to Visit  Medication Sig Dispense Refill  . acidophilus (RISAQUAD) CAPS capsule Take 2 capsules by mouth 3 (three) times daily. (Patient taking differently: Take 1 capsule by mouth daily. ) 50 capsule 0  . albuterol (PROVENTIL HFA;VENTOLIN HFA) 108 (90 Base) MCG/ACT inhaler Inhale 2 puffs into the lungs every 6 (six) hours as needed for wheezing or shortness of breath.    Marland Kitchen atorvastatin (LIPITOR) 10 MG tablet Take 1 tablet (10 mg total) by mouth daily. 90 tablet 3  . brimonidine (ALPHAGAN) 0.2 % ophthalmic solution Place 1 drop into both eyes 3 (three) times daily.  6  . calcium carbonate (OSCAL) 1500 (600 Ca) MG TABS tablet Take by mouth 2 (two) times daily with a meal.    . dorzolamide (TRUSOPT) 2 % ophthalmic solution Place 1 drop into both eyes 3 (three) times daily.  6  . furosemide (LASIX) 20 MG tablet TAKE 1 TABLET(20 MG) BY MOUTH EVERY OTHER DAY (Patient taking differently: 20 mg daily. ) 45 tablet 2  . metoprolol succinate (TOPROL-XL) 100 MG 24 hr tablet Take 1 tablet (100 mg total) by mouth daily. Take with or immediately following a meal. 90 tablet 3  . Multiple Vitamins-Minerals  (MULTIVITAMIN ADULT) TABS Take 1 tablet by mouth daily.    . Nutritional Supplements (NUTRITIONAL SHAKE PLUS PROTEIN PO) Take by mouth.    . pantoprazole (PROTONIX) 40 MG tablet Take 1 tablet (40 mg total) by mouth daily. 30 tablet 2  . Rivaroxaban (XARELTO) 15 MG TABS tablet Take 1 tablet (15 mg total) by mouth daily. 90 tablet 3  . TRAVATAN Z 0.004 % SOLN ophthalmic solution Place 1 drop into  both eyes at bedtime.   5  . vitamin C (ASCORBIC ACID) 500 MG tablet Take 500 mg by mouth daily.     No current facility-administered medications on file prior to visit.     BP (!) 142/72   Pulse 100   Temp 99.6 F (37.6 C) (Tympanic)   Ht 5\' 2"  (1.575 m)   Wt 134 lb 8 oz (61 kg)   SpO2 96%   BMI 24.60 kg/m    Objective:   Physical Exam  Constitutional: She is oriented to person, place, and time. She appears well-nourished. She appears ill.  Neck: Neck supple.  Cardiovascular:  Tachycardia  Respiratory: Effort normal.  Audible wheezing, diminished sounds to right lower field  GI: Soft. Bowel sounds are normal.  Tenderness to suprapubic, mostly to skin   Neurological: She is alert and oriented to person, place, and time.  Skin: Skin is warm and dry. There is erythema.  Erythema with warmth and firmness to bilateral lower abdomen, left medial thigh, left calf.           Assessment & Plan:  Weakness, Wheezing, Cough, Cellulitis:  Symptoms began overnight.  Exam today suspicious for cellulitis. She appears weak and sickly. Chest xray without pneumonia or changes in vascular congestion.  Rapid flu negative. UA negative for leuks, nitrites. Trace bleeding. Labs pending. Given presentation coupled with profound weakness, low grade fevers, recent fecal transplant, will send to [facility] ED for evaluation.  Pleas Koch, NP

## 2018-05-08 NOTE — H&P (Signed)
[facility] NAME: Heather Larson    MR#:  [id number]  DATE OF BIRTH:  1923-12-26  DATE OF ADMISSION:  05/08/2018  PRIMARY CARE PHYSICIAN: Pleas Koch, NP   REQUESTING/REFERRING PHYSICIAN:   CHIEF COMPLAINT: Swelling and redness in the left leg  HISTORY OF PRESENT ILLNESS: Heather Larson  is a 85 y.o. 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  Patient has redness in the left leg extending from the foot area up to the mid thigh area with swelling and tenderness.  The pain in the left leg is aching in nature 7 out of 10 on a scale of 1-10.  She also has some back pain.  She was evaluated in the emergency room her WBC count is elevated.  She was started on IV vancomycin and Zosyn antibiotics for cellulitis of the lower extremity and sepsis.  Lactic acid has been elevated.  PAST MEDICAL HISTORY:   Past Medical History:  Diagnosis Date  . Anemia   . Atrial fibrillation, permanent    on Xarelto  . Bilateral lower extremity edema   . Bilateral lower leg cellulitis 11/11/2017  . Cancer [facility])    breast cancer at age 52 and skin  . Cellulitis    lower abdomen  . CHF (congestive heart failure) ([place])   . Diarrhea 01/11/2018  . Dysrhythmia    a-fib  . Hyperlipidemia   . Hypertension   . Hypertension   . Incontinence   . Low sodium levels   . Macular degeneration   . Malnutrition of moderate degree 11/23/2017    PAST SURGICAL HISTORY:  Past Surgical History:  Procedure Laterality Date  . BREAST SURGERY     right mastectomy  . EYE SURGERY     x3  . FECAL TRANSPLANT N/A 05/03/2018   Procedure:  FECAL TRANSPLANT;  Surgeon: Virgel Manifold, MD;  Location: [facility] ENDOSCOPY;  Service: Gastroenterology;  Laterality: N/A;  . IR RADIOLOGIST EVAL & MGMT  09/17/2017  . MASTECTOMY     right  . SHOULDER OPEN ROTATOR CUFF REPAIR     left  . SKIN BIOPSY     skin cancer    SOCIAL HISTORY:  Social History   Tobacco Use  . Smoking status: Never Smoker  . Smokeless tobacco: Never Used  Substance Use Topics  . Alcohol use: Not Currently    Alcohol/week: 2.0 standard drinks    Types: 2 Shots of liquor per week    Comment: 2 drinks daily    FAMILY HISTORY: History reviewed. No pertinent family history.  DRUG ALLERGIES: No Known Allergies  REVIEW OF SYSTEMS:   CONSTITUTIONAL: No fever,has fatigue and weakness.  EYES: No blurred or double vision.  EARS, NOSE, AND THROAT: No tinnitus or ear pain.  RESPIRATORY: No cough, shortness of breath, wheezing or hemoptysis.  CARDIOVASCULAR: No chest pain, orthopnea, edema.  GASTROINTESTINAL: Has nausea, vomiting,  nodiarrhea or abdominal pain.  GENITOURINARY: No dysuria, hematuria.  ENDOCRINE: No polyuria, nocturia,  HEMATOLOGY: No anemia, easy bruising or bleeding SKIN: Redness of left leg MUSCULOSKELETAL: Has left leg pain NEUROLOGIC: No tingling, numbness, weakness.  PSYCHIATRY: No anxiety or depression.   MEDICATIONS AT HOME:  Prior  to Admission medications   Medication Sig Start Date End Date Taking? Authorizing Provider  acidophilus (RISAQUAD) CAPS capsule Take 2 capsules by mouth 3 (three) times daily. Patient taking differently: Take 1 capsule by mouth daily.  01/14/18   [name], MD  albuterol (PROVENTIL HFA;VENTOLIN HFA) 108 (90 Base) MCG/ACT inhaler Inhale 2 puffs into the lungs every 6 (six) hours as needed for wheezing or shortness of breath.    [provider]  atorvastatin (LIPITOR) 10 MG tablet Take 1 tablet (10 mg total) by mouth daily. 10/02/17   Minna Merritts, MD  brimonidine (ALPHAGAN) 0.2 %  ophthalmic solution Place 1 drop into both eyes 3 (three) times daily. 01/02/18   [provider]  calcium carbonate (OSCAL) 1500 (600 Ca) MG TABS tablet Take by mouth 2 (two) times daily with a meal.    [provider]  dorzolamide (TRUSOPT) 2 % ophthalmic solution Place 1 drop into both eyes 3 (three) times daily. 01/02/18   [provider]  furosemide (LASIX) 20 MG tablet TAKE 1 TABLET(20 MG) BY MOUTH EVERY OTHER DAY Patient taking differently: 20 mg daily.  04/29/18   Rise Mu, PA-C  metoprolol succinate (TOPROL-XL) 100 MG 24 hr tablet Take 1 tablet (100 mg total) by mouth daily. Take with or immediately following a meal. 03/26/18 06/24/18  Dunn, Areta Haber, PA-C  Multiple Vitamins-Minerals (MULTIVITAMIN ADULT) TABS Take 1 tablet by mouth daily.    [provider]  Nutritional Supplements (NUTRITIONAL SHAKE PLUS PROTEIN PO) Take by mouth.    [provider]  pantoprazole (PROTONIX) 40 MG tablet Take 1 tablet (40 mg total) by mouth daily. 03/09/18   Gladstone Lighter, MD  Rivaroxaban (XARELTO) 15 MG TABS tablet Take 1 tablet (15 mg total) by mouth daily. 10/02/17   Gollan, Kathlene November, MD  TRAVATAN Z 0.004 % SOLN ophthalmic solution Place 1 drop into both eyes at bedtime.  01/03/18   [provider]  vitamin C (ASCORBIC ACID) 500 MG tablet Take 500 mg by mouth daily.    [provider]      PHYSICAL EXAMINATION:   VITAL SIGNS: Blood pressure 130/84, pulse 63, temperature 99 F (37.2 C), temperature source Oral, resp. rate 18, height 5\' 2"  (1.575 m), weight 60.8 kg, SpO2 94 %.  GENERAL:  85 y.o.-year-old patient lying in the bed  EYES: Pupils equal, round, reactive to light and accommodation. No scleral icterus. Extraocular muscles intact.  HEENT: Head atraumatic, normocephalic. Oropharynx and nasopharynx clear.  NECK:  Supple, no jugular venous distention. No thyroid enlargement, no tenderness.  LUNGS: Normal breath sounds bilaterally, no  wheezing, rales,rhonchi or crepitation. No use of accessory muscles of respiration.  CARDIOVASCULAR: S1, S2 irregular. No murmurs, rubs, or gallops.  ABDOMEN: Soft, nontender, nondistended. Bowel sounds present. No organomegaly or mass.  EXTREMITIES: No cyanosis, or clubbing.  Has lower extremity edema Left leg swelling and redness present from the foot area up to lower part of the thigh Tenderness in the left leg NEUROLOGIC: Cranial nerves II through XII are intact. Muscle strength 5/5 in all extremities. Sensation intact. Gait not checked.  PSYCHIATRIC: The patient is alert and oriented x 3.  SKIN: Redness of the skin left lower extremity  LABORATORY PANEL:   CBC Recent Labs  Lab 05/08/18 1217 05/08/18 1326  WBC 31.6 Repeated and verified X2.* 28.4*  HGB 12.1 12.1  HCT 37.8 37.3  PLT 398.0 353  MCV 83.1 83.3  MCH  --  27.0  MCHC  32.0 32.4  RDW 16.7* 16.5*  LYMPHSABS 0.9  --   MONOABS 0.4  --   EOSABS 0.0  --   BASOSABS 0.1  --    ------------------------------------------------------------------------------------------------------------------  Chemistries  Recent Labs  Lab 05/08/18 1217 05/08/18 1326  NA 131* 130*  K 4.2 3.6  CL 96 97*  CO2 24 22  GLUCOSE 126* 126*  BUN 21 20  CREATININE 0.78 0.78  CALCIUM 9.7 9.0   ------------------------------------------------------------------------------------------------------------------ estimated creatinine clearance is 38.5 mL/min (by C-G formula based on SCr of 0.78 mg/dL). ------------------------------------------------------------------------------------------------------------------ No results for input(s): TSH, T4TOTAL, T3FREE, THYROIDAB in the last 72 hours.  Invalid input(s): FREET3   Coagulation profile No results for input(s): INR, PROTIME in the last 168 hours. ------------------------------------------------------------------------------------------------------------------- No results for input(s):  DDIMER in the last 72 hours. -------------------------------------------------------------------------------------------------------------------  Cardiac Enzymes No results for input(s): CKMB, TROPONINI, MYOGLOBIN in the last 168 hours.  Invalid input(s): CK ------------------------------------------------------------------------------------------------------------------ Invalid input(s): POCBNP  ---------------------------------------------------------------------------------------------------------------  Urinalysis    Component Value Date/Time   COLORURINE YELLOW (A) 03/07/2018 0339   APPEARANCEUR CLEAR (A) 03/07/2018 0339   LABSPEC 1.009 03/07/2018 0339   PHURINE 7.0 03/07/2018 0339   GLUCOSEU NEGATIVE 03/07/2018 0339   HGBUR NEGATIVE 03/07/2018 0339   BILIRUBINUR Negative 05/08/2018 1212   KETONESUR NEGATIVE 03/07/2018 0339   PROTEINUR Positive (A) 05/08/2018 1212   PROTEINUR NEGATIVE 03/07/2018 0339   UROBILINOGEN 0.2 05/08/2018 1212   NITRITE Negative 05/08/2018 1212   NITRITE NEGATIVE 03/07/2018 0339   LEUKOCYTESUR Negative 05/08/2018 1212     RADIOLOGY: Dg Chest 2 View  Result Date: 05/08/2018 CLINICAL DATA:  Cough and fevers EXAM: CHEST - 2 VIEW COMPARISON:  03/04/2018 FINDINGS: Cardiac shadow is enlarged in size but stable. Stable mild vascular congestion is seen. Mild blunting of the costophrenic angles is again identified similar to that seen on the prior exam. Underlying atelectatic changes are noted as well. Compression deformities with prior augmentation are noted in the lower thoracic spine with increased kyphosis. No new focal abnormality is seen. IMPRESSION: Bibasilar atelectatic changes and small effusions. Stable mild vascular congestion. Electronically Signed   By: Inez Catalina M.D.   On: 05/08/2018 12:26    EKG: Orders placed or performed in visit on 04/29/18  . 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 was referred from facility for left leg swelling, redness and tenderness  -Sepsis secondary left leg cellulitis Admit patient to stepdown unit Broad-spectrum IV antibiotics Started patient on IV vancomycin and Zosyn antibiotic Check cultures Follow-up lactic acid level IV fluids diligently in view of history of heart failure Intensivist consultation  -Left leg cellulitis Start patient on IV vancomycin and Zosyn antibiotic Doppler ultrasound of left leg to rule out DVT Surgery consultation  -Chronic atrial fibrillation Continue Xarelto for anticoagulation  -Hyperlipidemia Continue statin medication  -Acute on chronic back pain History of vertebral compression fractures Pain management with IV morphine as needed  -Chronic congestive heart failure Medical management to continue  -DVT prophylaxis On anticoagulation with Xarelto   All the records are reviewed and case discussed with ED provider. Management plans discussed with the patient, family and they are in agreement.  CODE STATUS:DNR Code Status History    Date Active Date Inactive Code Status Order ID Comments User  Context   03/05/2018 0523 03/08/2018 2145 DNR [id number]  Harrie Foreman, MD Inpatient   01/11/2018 2202 01/14/2018 2043 DNR [id number]  Dustin Flock, MD ED   11/11/2017 1804 11/27/2017 2149 DNR [id number]  Hillary Bow, MD ED   11/11/2017 1731 11/11/2017 1804 Full Code [id number]  Hillary Bow, MD ED    Questions for Most Recent Historical Code Status (Order [id number])    Question Answer Comment   In the event of cardiac or respiratory ARREST Do not call a "code blue"    In the event of cardiac or respiratory ARREST Do not perform Intubation,  CPR, defibrillation or ACLS    In the event of cardiac or respiratory ARREST Use medication by any route, position, wound care, and other measures to relive pain and suffering. May use oxygen, suction and manual treatment of airway obstruction as needed for comfort.         Advance Directive Documentation     Most Recent Value  Type of Advance Directive  Healthcare Power of Attorney, Living will, Out of facility DNR (pink MOST or yellow form)  Pre-existing out of facility DNR order (yellow form or pink MOST form)  -  "MOST" Form in Place?  -       TOTAL TIME TAKING CARE OF THIS PATIENT: 54 minutes.    [name] M.D on 05/08/2018 at 4:23 PM  Between 7am to 6pm - Pager - [phone]  After 6pm go to [email or web address] - password [email or web address] [email or web address]  [facility]  Office  [phone]  CC: Primary care physician; Pleas Koch, NP

## 2018-05-09 ENCOUNTER — Other Ambulatory Visit: Payer: Self-pay

## 2018-05-09 LAB — BLOOD CULTURE ID PANEL (REFLEXED)
Acinetobacter baumannii: NOT DETECTED
CANDIDA GLABRATA: NOT DETECTED
Candida albicans: NOT DETECTED
Candida krusei: NOT DETECTED
Candida parapsilosis: NOT DETECTED
Candida tropicalis: NOT DETECTED
ENTEROBACTER CLOACAE COMPLEX: NOT DETECTED
Enterobacteriaceae species: NOT DETECTED
Enterococcus species: NOT DETECTED
Escherichia coli: NOT DETECTED
Haemophilus influenzae: NOT DETECTED
Klebsiella oxytoca: NOT DETECTED
Klebsiella pneumoniae: NOT DETECTED
Listeria monocytogenes: NOT DETECTED
Neisseria meningitidis: NOT DETECTED
PROTEUS SPECIES: NOT DETECTED
Pseudomonas aeruginosa: NOT DETECTED
Serratia marcescens: NOT DETECTED
Staphylococcus aureus (BCID): NOT DETECTED
Staphylococcus species: NOT DETECTED
Streptococcus agalactiae: NOT DETECTED
Streptococcus pneumoniae: NOT DETECTED
Streptococcus pyogenes: NOT DETECTED
Streptococcus species: DETECTED — AB

## 2018-05-09 LAB — BASIC METABOLIC PANEL
Anion gap: 9 (ref 5–15)
BUN: 23 mg/dL (ref 8–23)
CO2: 20 mmol/L — ABNORMAL LOW (ref 22–32)
Calcium: 7.7 mg/dL — ABNORMAL LOW (ref 8.9–10.3)
Chloride: 98 mmol/L (ref 98–111)
Creatinine, Ser: 0.8 mg/dL (ref 0.44–1.00)
Glucose, Bld: 103 mg/dL — ABNORMAL HIGH (ref 70–99)
Potassium: 3.6 mmol/L (ref 3.5–5.1)
Sodium: 127 mmol/L — ABNORMAL LOW (ref 135–145)

## 2018-05-09 LAB — CBC
HCT: 32.9 % — ABNORMAL LOW (ref 36.0–46.0)
Hemoglobin: 10.7 g/dL — ABNORMAL LOW (ref 12.0–15.0)
MCH: 26.8 pg (ref 26.0–34.0)
MCHC: 32.5 g/dL (ref 30.0–36.0)
MCV: 82.3 fL (ref 80.0–100.0)
Platelets: 285 10*3/uL (ref 150–400)
RBC: 4 MIL/uL (ref 3.87–5.11)
RDW: 16.6 % — ABNORMAL HIGH (ref 11.5–15.5)
WBC: 16 10*3/uL — ABNORMAL HIGH (ref 4.0–10.5)
nRBC: 0 % (ref 0.0–0.2)

## 2018-05-09 LAB — PATHOLOGIST SMEAR REVIEW

## 2018-05-09 LAB — MRSA PCR SCREENING: MRSA by PCR: POSITIVE — AB

## 2018-05-09 MED ORDER — CALCIUM CARBONATE 1500 (600 CA) MG PO TABS: 600.0000 mg | ORAL_TABLET | Freq: Two times a day (BID) | ORAL | Status: DC

## 2018-05-09 MED ORDER — ALBUTEROL SULFATE (2.5 MG/3ML) 0.083% IN NEBU
3.0000 mL | INHALATION_SOLUTION | Freq: Four times a day (QID) | RESPIRATORY_TRACT | Status: DC | PRN
  Administered 2018-05-09: 3 mL via RESPIRATORY_TRACT
  Filled 2018-05-09 (×2): qty 3

## 2018-05-09 MED ORDER — ONDANSETRON HCL 4 MG/2ML IJ SOLN: 4.0000 mg | Freq: Four times a day (QID) | INTRAMUSCULAR | Status: DC | PRN

## 2018-05-09 MED ORDER — SODIUM CHLORIDE 0.9 % IV SOLN
2.0000 g | INTRAVENOUS | Status: DC
  Administered 2018-05-09 – 2018-05-11 (×3): 2 g via INTRAVENOUS
  Filled 2018-05-09: qty 20
  Filled 2018-05-09 (×3): qty 2

## 2018-05-09 MED ORDER — PANTOPRAZOLE SODIUM 40 MG PO TBEC
40.0000 mg | DELAYED_RELEASE_TABLET | Freq: Every day | ORAL | Status: DC
  Administered 2018-05-09 – 2018-05-12 (×4): 40 mg via ORAL
  Filled 2018-05-09 (×4): qty 1

## 2018-05-09 MED ORDER — DORZOLAMIDE HCL 2 % OP SOLN
1.0000 [drp] | Freq: Three times a day (TID) | OPHTHALMIC | Status: DC
  Administered 2018-05-09 – 2018-05-12 (×10): 1 [drp] via OPHTHALMIC
  Filled 2018-05-09: qty 10

## 2018-05-09 MED ORDER — SODIUM CHLORIDE 0.9 % IV SOLN
INTRAVENOUS | Status: DC | PRN
  Administered 2018-05-09 – 2018-05-11 (×3): 250 mL via INTRAVENOUS

## 2018-05-09 MED ORDER — ACETAMINOPHEN 650 MG RE SUPP: 650.0000 mg | Freq: Four times a day (QID) | RECTAL | Status: DC | PRN

## 2018-05-09 MED ORDER — LATANOPROST 0.005 % OP SOLN
1.0000 [drp] | Freq: Every day | OPHTHALMIC | Status: DC
  Administered 2018-05-09 – 2018-05-10 (×3): 1 [drp] via OPHTHALMIC
  Filled 2018-05-09: qty 2.5

## 2018-05-09 MED ORDER — RISAQUAD PO CAPS
1.0000 | ORAL_CAPSULE | Freq: Every day | ORAL | Status: DC
  Administered 2018-05-09 – 2018-05-12 (×4): 1 via ORAL
  Filled 2018-05-09 (×4): qty 1

## 2018-05-09 MED ORDER — SENNOSIDES-DOCUSATE SODIUM 8.6-50 MG PO TABS: 1.0000 | ORAL_TABLET | Freq: Every evening | ORAL | Status: DC | PRN

## 2018-05-09 MED ORDER — VITAMIN C 500 MG PO TABS
500.0000 mg | ORAL_TABLET | Freq: Every day | ORAL | Status: DC
  Administered 2018-05-09 – 2018-05-12 (×4): 500 mg via ORAL
  Filled 2018-05-09 (×4): qty 1

## 2018-05-09 MED ORDER — ACETAMINOPHEN 325 MG PO TABS
650.0000 mg | ORAL_TABLET | Freq: Four times a day (QID) | ORAL | Status: DC | PRN
  Administered 2018-05-11: 650 mg via ORAL
  Filled 2018-05-09: qty 2

## 2018-05-09 MED ORDER — ATORVASTATIN CALCIUM 10 MG PO TABS
10.0000 mg | ORAL_TABLET | Freq: Every day | ORAL | Status: DC
  Administered 2018-05-09 – 2018-05-12 (×4): 10 mg via ORAL
  Filled 2018-05-09 (×4): qty 1

## 2018-05-09 MED ORDER — METOPROLOL SUCCINATE ER 100 MG PO TB24
100.0000 mg | ORAL_TABLET | Freq: Every day | ORAL | Status: DC
  Administered 2018-05-10 – 2018-05-12 (×3): 100 mg via ORAL
  Filled 2018-05-09 (×4): qty 1

## 2018-05-09 MED ORDER — SODIUM CHLORIDE 1 G PO TABS
1.0000 g | ORAL_TABLET | Freq: Two times a day (BID) | ORAL | Status: DC
  Administered 2018-05-09: 1 g via ORAL
  Filled 2018-05-09 (×4): qty 1

## 2018-05-09 MED ORDER — BRIMONIDINE TARTRATE 0.2 % OP SOLN
1.0000 [drp] | Freq: Three times a day (TID) | OPHTHALMIC | Status: DC
  Administered 2018-05-09 – 2018-05-12 (×10): 1 [drp] via OPHTHALMIC
  Filled 2018-05-09: qty 5

## 2018-05-09 MED ORDER — RIVAROXABAN 15 MG PO TABS
15.0000 mg | ORAL_TABLET | Freq: Every day | ORAL | Status: DC
  Administered 2018-05-09 – 2018-05-12 (×4): 15 mg via ORAL
  Filled 2018-05-09 (×4): qty 1

## 2018-05-09 MED ORDER — HYDROCODONE-ACETAMINOPHEN 5-325 MG PO TABS
1.0000 | ORAL_TABLET | ORAL | Status: DC | PRN
  Administered 2018-05-11 (×2): 2 via ORAL
  Filled 2018-05-09 (×2): qty 2

## 2018-05-09 MED ORDER — FUROSEMIDE 10 MG/ML IJ SOLN
20.0000 mg | Freq: Once | INTRAMUSCULAR | Status: AC
  Administered 2018-05-09: 20 mg via INTRAVENOUS
  Filled 2018-05-09: qty 4

## 2018-05-09 MED ORDER — ADULT MULTIVITAMIN W/MINERALS CH
1.0000 | ORAL_TABLET | Freq: Every day | ORAL | Status: DC
  Administered 2018-05-09 – 2018-05-12 (×4): 1 via ORAL
  Filled 2018-05-09 (×4): qty 1

## 2018-05-09 MED ORDER — CALCIUM CARBONATE ANTACID 500 MG PO CHEW
3.0000 | CHEWABLE_TABLET | Freq: Two times a day (BID) | ORAL | Status: DC
  Administered 2018-05-09 – 2018-05-12 (×3): 600 mg via ORAL
  Filled 2018-05-09 (×4): qty 3

## 2018-05-09 MED ORDER — FUROSEMIDE 20 MG PO TABS
20.0000 mg | ORAL_TABLET | Freq: Every day | ORAL | Status: DC
  Administered 2018-05-10 – 2018-05-11 (×2): 20 mg via ORAL
  Filled 2018-05-09 (×2): qty 1

## 2018-05-09 MED ORDER — SODIUM CHLORIDE 0.9 % IV SOLN
INTRAVENOUS | Status: DC
  Administered 2018-05-09: 01:00:00 via INTRAVENOUS

## 2018-05-09 MED ORDER — ONDANSETRON HCL 4 MG PO TABS: 4.0000 mg | ORAL_TABLET | Freq: Four times a day (QID) | ORAL | Status: DC | PRN

## 2018-05-09 NOTE — Progress Notes (Signed)
PHARMACY - PHYSICIAN COMMUNICATION CRITICAL VALUE ALERT - BLOOD CULTURE IDENTIFICATION (BCID)  Heather Larson is an 85 y.o. female who presented to [facility] on 05/08/2018 with a chief complaint of celluulitis/sepsis  Assessment: 2/4 Strep spp   (include suspected source if known)  Name of physician (or Provider) Contacted: Marcille Blanco  Current antibiotics: vanc/Zosyn  Changes to prescribed antibiotics recommended:  n/a  Results for orders placed or performed during the hospital encounter of 05/08/18  Blood Culture ID Panel (Reflexed) (Collected: 05/08/2018  4:32 PM)  Result Value Ref Range   Enterococcus species NOT DETECTED NOT DETECTED   Listeria monocytogenes NOT DETECTED NOT DETECTED   Staphylococcus species NOT DETECTED NOT DETECTED   Staphylococcus aureus (BCID) NOT DETECTED NOT DETECTED   Streptococcus species DETECTED (A) NOT DETECTED   Streptococcus agalactiae NOT DETECTED NOT DETECTED   Streptococcus pneumoniae NOT DETECTED NOT DETECTED   Streptococcus pyogenes NOT DETECTED NOT DETECTED   Acinetobacter baumannii NOT DETECTED NOT DETECTED   Enterobacteriaceae species NOT DETECTED NOT DETECTED   Enterobacter cloacae complex NOT DETECTED NOT DETECTED   Escherichia coli NOT DETECTED NOT DETECTED   Klebsiella oxytoca NOT DETECTED NOT DETECTED   Klebsiella pneumoniae NOT DETECTED NOT DETECTED   Proteus species NOT DETECTED NOT DETECTED   Serratia marcescens NOT DETECTED NOT DETECTED   Haemophilus influenzae NOT DETECTED NOT DETECTED   Neisseria meningitidis NOT DETECTED NOT DETECTED   Pseudomonas aeruginosa NOT DETECTED NOT DETECTED   Candida albicans NOT DETECTED NOT DETECTED   Candida glabrata NOT DETECTED NOT DETECTED   Candida krusei NOT DETECTED NOT DETECTED   Candida parapsilosis NOT DETECTED NOT DETECTED   Candida tropicalis NOT DETECTED NOT DETECTED    Janelie Goltz S 05/09/2018  4:36 AM

## 2018-05-09 NOTE — ED Notes (Signed)
Transferring patient to room 217

## 2018-05-09 NOTE — Progress Notes (Signed)
Subjective:  CC:  Heather Larson is a 85 y.o. female  Hospital stay day 1,   cellulitis  HPI: No issues overnight.  Patient more alert and talkative today.  States overall pain is slightly better.  ROS:  General: Denies weight loss, weight gain, fatigue, fevers, chills, and night sweats. Heart: Denies chest pain, palpitations, or decreased activity tolerance. Respiratory: Denies breathing difficulty, shortness of breath, wheezing, cough, and sputum. GI: Denies change in appetite, heartburn, nausea, vomiting, constipation, diarrhea, and blood in stool. GU: Denies difficulty urinating, pain with urinating, urgency, frequency, blood in urine.   Objective:      Temp:  [98 F (36.7 C)-99.6 F (37.6 C)] 98.6 F (37 C) (01/09 0433) Pulse Rate:  [63-134] 88 (01/09 0433) Resp:  [18-31] 20 (01/09 0433) BP: (90-149)/(40-103) 90/40 (01/09 0433) SpO2:  [94 %-99 %] 99 % (01/09 0433) Weight:  [60.8 kg-64 kg] 64 kg (01/09 0027)     Height: 5\' 2"  (157.5 cm) Weight: 64 kg BMI (Calculated): 25.8   Intake/Output this shift:   Intake/Output Summary (Last 24 hours) at 05/09/2018 0900 Last data filed at 05/08/2018 1912 Gross per 24 hour  Intake 1750 ml  Output 50 ml  Net 1700 ml        Constitutional :  alert, cooperative, appears stated age and no distress  Respiratory:  clear to auscultation bilaterally  Cardiovascular:  irregularly irregular rhythm  Gastrointestinal: soft, suprapubic region cellulitis' degree of erythema looks improved compared to yesterday.  no significant change in degree of erythema or spread of erythmea in BLE.  still has tenderness throughout but no crepitus..   Skin: Cool and moist.   Psychiatric: Normal affect, non-agitated, not confused       LABS:  CMP Latest Ref Rng & Units 05/09/2018 05/08/2018 05/08/2018  Glucose 70 - 99 mg/dL 103(H) 126(H) 126(H)  BUN 8 - 23 mg/dL 23 20 21   Creatinine 0.44 - 1.00 mg/dL 0.80 0.78 0.78  Sodium 135 - 145 mmol/L 127(L) 130(L)  131(L)  Potassium 3.5 - 5.1 mmol/L 3.6 3.6 4.2  Chloride 98 - 111 mmol/L 98 97(L) 96  CO2 22 - 32 mmol/L 20(L) 22 24  Calcium 8.9 - 10.3 mg/dL 7.7(L) 9.0 9.7  Total Protein 6.5 - 8.1 g/dL - - -  Total Bilirubin 0.3 - 1.2 mg/dL - - -  Alkaline Phos 38 - 126 U/L - - -  AST 15 - 41 U/L - - -  ALT 0 - 44 U/L - - -   CBC Latest Ref Rng & Units 05/09/2018 05/08/2018 05/08/2018  WBC 4.0 - 10.5 K/uL 16.0(H) 28.4(H) 31.6 Repeated and verified X2.(HH)  Hemoglobin 12.0 - 15.0 g/dL 10.7(L) 12.1 12.1  Hematocrit 36.0 - 46.0 % 32.9(L) 37.3 37.8  Platelets 150 - 400 K/uL 285 353 398.0    RADS: CLINICAL DATA:  Left lower extremity swelling for several months with recent worsening.  EXAM: LEFT LOWER EXTREMITY VENOUS DOPPLER ULTRASOUND  TECHNIQUE: Gray-scale sonography with graded compression, as well as color Doppler and duplex ultrasound were performed to evaluate the lower extremity deep venous systems from the level of the common femoral vein and including the common femoral, femoral, profunda femoral, popliteal and calf veins including the posterior tibial, peroneal and gastrocnemius veins when visible. The superficial great saphenous vein was also interrogated. Spectral Doppler was utilized to evaluate flow at rest and with distal augmentation maneuvers in the common femoral, femoral and popliteal veins.  COMPARISON:  Bilateral lower extremity venous Doppler scan dated  11/18/2017  FINDINGS: Contralateral Common Femoral Vein: Respiratory phasicity is normal and symmetric with the symptomatic side. No evidence of thrombus. Normal compressibility.  Common Femoral Vein: No evidence of thrombus. Normal compressibility, respiratory phasicity and response to augmentation.  Saphenofemoral Junction: No evidence of thrombus. Normal compressibility and flow on color Doppler imaging.  Profunda Femoral Vein: No evidence of thrombus. Normal compressibility and flow on color Doppler  imaging.  Femoral Vein: No evidence of thrombus. Normal compressibility, respiratory phasicity and response to augmentation.  Popliteal Vein: No evidence of thrombus. Normal compressibility, respiratory phasicity and response to augmentation.  Calf Veins: Nonvisualization of the left calf deep veins, potentially due to prominent overlying subcutaneous edema.  Superficial Great Saphenous Vein: No evidence of thrombus. Normal compressibility.  Venous Reflux:  None.  Other Findings:  None.  IMPRESSION: No evidence of deep venous thrombosis in the left lower extremity, with limited evaluation of the left calf due to overlying subcutaneous edema.   Electronically Signed   By: Ilona Sorrel M.D.   On: 05/08/2018 19:50 Assessment:   BLE cellulitis and suprapubic cellulitis.  Clinical exam with slight improvement and labs looking better as well. Doubt this is necrotiziing fasciitis or any localizing infection such as an abscess at this point.  Surgery will continue to monitor area for now, in case this evolves into abscess or other surgical issue.   Continue present management for now per primary

## 2018-05-09 NOTE — Care Management (Addendum)
Patient admitted from [facility].  Patient open with [facility] for RN and PT.  Helene Kelp with [facility] notified of admission. PT eval requested

## 2018-05-09 NOTE — Progress Notes (Signed)
[place] [facility] [place] NAME: Heather Larson    MR#:  [id number]  DATE OF BIRTH:  [birth year]  SUBJECTIVE:  CHIEF COMPLAINT: Patient's reporting the left leg redness is slightly better than yesterday.  Daughter at bedside.  Recent history of C. difficile status post fecal transplantation  REVIEW OF SYSTEMS:  CONSTITUTIONAL: No fever, fatigue or weakness.  EYES: No blurred or double vision.  EARS, NOSE, AND THROAT: No tinnitus or ear pain.  RESPIRATORY: No cough, shortness of breath, wheezing or hemoptysis.  CARDIOVASCULAR: No chest pain, orthopnea, edema.  GASTROINTESTINAL: No nausea, vomiting, diarrhea or abdominal pain.  GENITOURINARY: No dysuria, hematuria.  ENDOCRINE: No polyuria, nocturia,  HEMATOLOGY: No anemia, easy bruising or bleeding SKIN: Worsening of left leg redness MUSCULOSKELETAL: No joint pain or arthritis.   NEUROLOGIC: No tingling, numbness, weakness.  PSYCHIATRY: No anxiety or depression.   DRUG ALLERGIES:  No Known Allergies  VITALS:  Blood pressure 114/60, pulse 92, temperature 98.6 F (37 C), temperature source Oral, resp. rate 20, height _0  (1.575 m), weight 64 kg, SpO2 96 %.  PHYSICAL EXAMINATION:  GENERAL:  85 y.o.-year-old patient lying in the bed with no acute distress.  EYES: Pupils equal, round, reactive to light and accommodation. No scleral icterus. Extraocular muscles intact.  HEENT: Head atraumatic, normocephalic. Oropharynx and nasopharynx clear.  NECK:  Supple, no jugular venous distention. No thyroid enlargement, no tenderness.  LUNGS: Normal breath sounds bilaterally, no wheezing, rales,rhonchi or crepitation. No use of accessory muscles of respiration.  CARDIOVASCULAR: S1, S2 normal. No murmurs, rubs, or gallops.  ABDOMEN: Soft, nontender, nondistended. Bowel sounds present. No organomegaly or mass.  EXTREMITIES: Left leg swelling and redness present from the foot area up to lower part of  the thigh.  Lower extremity edema present Tenderness in the left leg. No pedal edema, cyanosis, or clubbing.  NEUROLOGIC: Awake and alert and oriented x3 sensation intact. Gait not checked.  PSYCHIATRIC: The patient is alert and oriented x 3.  SKIN: No obvious rash, lesion, or ulcer.    LABORATORY PANEL:   CBC Recent Labs  Lab 05/09/18 0455  WBC 16.0*  HGB 10.7*  HCT 32.9*  PLT 285   ------------------------------------------------------------------------------------------------------------------  Chemistries  Recent Labs  Lab 05/09/18 0455  NA 127*  K 3.6  CL 98  CO2 20*  GLUCOSE 103*  BUN 23  CREATININE 0.80  CALCIUM 7.7*   ------------------------------------------------------------------------------------------------------------------  Cardiac Enzymes Recent Labs  Lab 05/08/18 1326  TROPONINI 0.04*   ------------------------------------------------------------------------------------------------------------------  RADIOLOGY:  Dg Chest 2 View  Result Date: 05/08/2018 CLINICAL DATA:  Cough and fevers EXAM: CHEST - 2 VIEW COMPARISON:  03/04/2018 FINDINGS: Cardiac shadow is enlarged in size but stable. Stable mild vascular congestion is seen. Mild blunting of the costophrenic angles is again identified similar to that seen on the prior exam. Underlying atelectatic changes are noted as well. Compression deformities with prior augmentation are noted in the lower thoracic spine with increased kyphosis. No new focal abnormality is seen. IMPRESSION: Bibasilar atelectatic changes and small effusions. Stable mild vascular congestion. Electronically Signed   By: Inez Catalina M.D.   On: 05/08/2018 12:26   Dg Tibia/fibula Left  Result Date: 05/08/2018 CLINICAL DATA:  Cellulitis of the left lower leg. Redness and swelling. Tenderness to touch. EXAM: LEFT TIBIA AND FIBULA - 2 VIEW COMPARISON:  None. FINDINGS: The tibia and fibula appear normal. No appreciable arthritic changes at  the knee or ankle. Nonspecific subcutaneous edema seen  throughout the lower leg. Arterial vascular calcifications. IMPRESSION: Soft tissue edema. Otherwise, normal exam. Electronically Signed   By: Lorriane Shire M.D.   On: 05/08/2018 17:15   US Venous Img Lower Unilateral Left  Result Date: 05/08/2018 CLINICAL DATA:  Left lower extremity swelling for several months with recent worsening. EXAM: LEFT LOWER EXTREMITY VENOUS DOPPLER ULTRASOUND TECHNIQUE: Gray-scale sonography with graded compression, as well as color Doppler and duplex ultrasound were performed to evaluate the lower extremity deep venous systems from the level of the common femoral vein and including the common femoral, femoral, profunda femoral, popliteal and calf veins including the posterior tibial, peroneal and gastrocnemius veins when visible. The superficial great saphenous vein was also interrogated. Spectral Doppler was utilized to evaluate flow at rest and with distal augmentation maneuvers in the common femoral, femoral and popliteal veins. COMPARISON:  Bilateral lower extremity venous Doppler scan dated 11/18/2017 FINDINGS: Contralateral Common Femoral Vein: Respiratory phasicity is normal and symmetric with the symptomatic side. No evidence of thrombus. Normal compressibility. Common Femoral Vein: No evidence of thrombus. Normal compressibility, respiratory phasicity and response to augmentation. Saphenofemoral Junction: No evidence of thrombus. Normal compressibility and flow on color Doppler imaging. Profunda Femoral Vein: No evidence of thrombus. Normal compressibility and flow on color Doppler imaging. Femoral Vein: No evidence of thrombus. Normal compressibility, respiratory phasicity and response to augmentation. Popliteal Vein: No evidence of thrombus. Normal compressibility, respiratory phasicity and response to augmentation. Calf Veins: Nonvisualization of the left calf deep veins, potentially due to prominent overlying  subcutaneous edema. Superficial Great Saphenous Vein: No evidence of thrombus. Normal compressibility. Venous Reflux:  None. Other Findings:  None. IMPRESSION: No evidence of deep venous thrombosis in the left lower extremity, with limited evaluation of the left calf due to overlying subcutaneous edema. Electronically Signed   By: Ilona Sorrel M.D.   On: 05/08/2018 19:50    EKG:   Orders placed or performed in visit on 04/29/18  . 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  Patient met septic criteria at the time of admission with elevated lactic acid and leukocytosis Broad-spectrum IV antibiotics Zosyn and vancomycin changed to IV Rocephin as cultures with strep Follow-up lactic acid level 2.7-1.7  IV fluids diligently in view of history of heart failure Intensivist consultation as needed  -Left leg cellulitis Broad-spectrum IV antibiotics vancomycin and Zosyn were started which are changed to IV Rocephin 05/09/2018  Doppler ultrasound of left leg negative DVT Surgery consultation-seen by Dr. Lysle Pearl, necrotizing fasciitis is very unlikely no surgical interventions needed at this time continue close monitoring and conservative management  -Hyponatremia sodium at 127 patient is asymptomatic continue close monitoring and check BMP in a.m.  -Chronic atrial fibrillation Continue Xarelto for anticoagulation  -History of C. difficile colitis status post fecal transplantation recently  -Hyperlipidemia Continue statin medication  -Acute on chronic back pain History of  vertebral compression fractures Pain management with IV morphine as needed  -Chronic congestive heart failure Medical management to continue  -DVT prophylaxis On anticoagulation with Xarelto     All the records are reviewed and case discussed with Care Management/Social Workerr. Management plans discussed with the patient,  family and they are in agreement.  CODE STATUS: DNR   TOTAL TIME TAKING CARE OF THIS PATIENT: 36  minutes.   POSSIBLE D/C IN 2-3  DAYS, DEPENDING ON CLINICAL CONDITION.  Note: This dictation was prepared with Dragon dictation along with smaller phrase technology. Any transcriptional errors that result from this process are unintentional.   Nicholes Mango M.D on 05/09/2018 at 4:15 PM  Between 7am to 6pm - Pager - [phone] After 6pm go to [email or web address] - password [email or web address] [facility]  [facility]  Office  [phone]  CC: Primary care physician; Pleas Koch, NP

## 2018-05-10 ENCOUNTER — Inpatient Hospital Stay: Payer: Medicare HMO

## 2018-05-10 LAB — BASIC METABOLIC PANEL
Anion gap: 9 (ref 5–15)
BUN: 27 mg/dL — ABNORMAL HIGH (ref 8–23)
CO2: 20 mmol/L — ABNORMAL LOW (ref 22–32)
Calcium: 7.8 mg/dL — ABNORMAL LOW (ref 8.9–10.3)
Chloride: 98 mmol/L (ref 98–111)
Creatinine, Ser: 0.88 mg/dL (ref 0.44–1.00)
GFR calc Af Amer: >60 mL/min (ref >60)
GFR calc non Af Amer: 57 mL/min — ABNORMAL LOW (ref >60)
Glucose, Bld: 107 mg/dL — ABNORMAL HIGH (ref 70–99)
Potassium: 3.2 mmol/L — ABNORMAL LOW (ref 3.5–5.1)
Sodium: 127 mmol/L — ABNORMAL LOW (ref 135–145)

## 2018-05-10 LAB — CBC
HCT: 31.6 % — ABNORMAL LOW (ref 36.0–46.0)
Hemoglobin: 10.2 g/dL — ABNORMAL LOW (ref 12.0–15.0)
MCH: 26.6 pg (ref 26.0–34.0)
MCHC: 32.3 g/dL (ref 30.0–36.0)
MCV: 82.3 fL (ref 80.0–100.0)
Platelets: 261 10*3/uL (ref 150–400)
RBC: 3.84 MIL/uL — ABNORMAL LOW (ref 3.87–5.11)
RDW: 16.7 % — ABNORMAL HIGH (ref 11.5–15.5)
WBC: 22.1 10*3/uL — ABNORMAL HIGH (ref 4.0–10.5)
nRBC: 0 % (ref 0.0–0.2)

## 2018-05-10 LAB — URINE CULTURE: CULTURE: NO GROWTH

## 2018-05-10 MED ORDER — MUPIROCIN 2 % EX OINT
1.0000 "application " | TOPICAL_OINTMENT | Freq: Two times a day (BID) | CUTANEOUS | Status: DC
  Administered 2018-05-10 – 2018-05-12 (×4): 1 via NASAL
  Filled 2018-05-10: qty 22

## 2018-05-10 MED ORDER — SODIUM CHLORIDE 1 G PO TABS
1.0000 g | ORAL_TABLET | Freq: Three times a day (TID) | ORAL | Status: DC
  Administered 2018-05-11 – 2018-05-12 (×4): 1 g via ORAL
  Filled 2018-05-10 (×6): qty 1

## 2018-05-10 MED ORDER — CHLORHEXIDINE GLUCONATE CLOTH 2 % EX PADS
6.0000 | MEDICATED_PAD | Freq: Every day | CUTANEOUS | Status: DC
  Administered 2018-05-10 – 2018-05-11 (×2): 6 via TOPICAL

## 2018-05-10 NOTE — Evaluation (Signed)
Physical Therapy Evaluation Patient Details Name: Heather Larson MRN: [id number] DOB: [birth year] Today's Date: 05/10/2018   History of Present Illness  85 y.o. 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  She is admitted with cellulitis redness in the left leg along with pain.   Clinical Impression  Pt in recliner on arrival, eager to work with PT despite c/o pain and general weakness in b/l LEs.  She showed good effort with sit to stand and during ~10 minutes of standing/gait training activities.  She did need assist in getting to standing from recliner, but once up showed good confidence and safety.  Pt had issue with bladder incontinence during ambulation, but likely could have done more than the 25 ft with walker had this not been an issue.  She feels safe to return to [facility] (they can bring meals to room if needed) with HHPT (that she states she was currently having).  Pt on O2 on arrival with sats near 100%, on room air t/o the session she stayed in the 90s and was 93% post session w/o c/o excessive fatigue.  Nursing notified, pt hoping to continue improving and to avoid needing to go to rehab.     Follow Up Recommendations Home health PT;Supervision/Assistance - 24 hour(per continued improvement)    Equipment Recommendations  Rolling walker with 5" wheels    Recommendations for Other Services       Precautions / Restrictions Precautions Precautions: Fall Restrictions Weight Bearing Restrictions: No      Mobility  Bed Mobility               General bed mobility comments: not tested, pt up in recliner on arrival  Transfers Overall transfer level: Needs assistance Equipment used: Rolling walker (2 wheeled) Transfers: Sit to/from  Stand Sit to Stand: Min assist         General transfer comment: Attempted X 1 w/o PT assist and pt was uanble to initiate rising, however with only light assist she was able to get to standing using walker  Ambulation/Gait Ambulation/Gait assistance: Supervision Gait Distance (Feet): 25 Feet Assistive device: Rolling walker (2 wheeled)       General Gait Details: Pt did relatively well with in-room ambulation.  She was reliant on the walker (rollator at baseline) but overall showed good tolerance with b/l LE WBing, no LOBs and only minimal fatigue.  O2 in the 90s on room air t/o the effort.  She did have bladder incontinence while walking and further ambulation deferred (though she likely could have gone much farther)  [employer] (Stroke Patients Only)       Balance Overall balance assessment: Needs assistance Sitting-balance support: No upper extremity supported Sitting balance-Leahy Scale: Fair Sitting balance - Comments: Pt leaning back with poor posture, but able to maintain sitting balance w/o assist     Standing balance-Leahy Scale: Fair Standing balance comment: reliant on the walker (b/l LE pain, general weakness) but no LOBs and good overall confidence during standing acts using the walker.                              Pertinent Vitals/Pain Pain Assessment: 0-10 Pain Score: 5  Pain Location: L > R LE    Home Living Family/patient expects to be discharged to:: Assisted living               Home Equipment: [place] - 4 wheels Additional Comments: [facility] independent living;     Prior Function Level of Independence: Independent with assistive device(s)         Comments: did need assistance with bathing 2x a week; mod I for dressing and eating; used rollator for ambulation (down to dining area 3x/day)     Hand Dominance        Extremity/Trunk Assessment   Upper Extremity  Assessment Upper Extremity Assessment: Generalized weakness    Lower Extremity Assessment Lower Extremity Assessment: Generalized weakness(somewhat pain limtied but functional for minimal activity)       Communication   Communication: No difficulties  Cognition Arousal/Alertness: Awake/alert Behavior During Therapy: WFL for tasks assessed/performed Overall Cognitive Status: Within Functional Limits for tasks assessed                                        General Comments      Exercises     Assessment/Plan    PT Assessment Patient needs continued PT services  PT Problem List Decreased strength;Decreased range of motion;Decreased activity tolerance;Decreased balance;Decreased mobility;Decreased coordination;Decreased knowledge of use of DME;Decreased safety awareness;Pain;Cardiopulmonary status limiting activity       PT Treatment Interventions DME instruction;Gait training;Functional mobility training;Therapeutic exercise;Therapeutic activities;Balance training;Neuromuscular re-education;Patient/family education    PT Goals (Current goals can be found in the Care Plan section)  Acute Rehab PT Goals Patient Stated Goal: go back to [facility] PT Goal Formulation: With patient Time For Goal Achievement: 05/24/18 Potential to Achieve Goals: Good    Frequency Min 2X/week   Barriers to discharge        Co-evaluation               AM-PAC PT "6 Clicks" Mobility  Outcome Measure Help needed turning from your back to your side while in a flat bed without using bedrails?: A Little Help needed moving from lying on your back to sitting on the side of a flat bed without using bedrails?: A Little Help needed moving to and from a bed to a chair (including a wheelchair)?: A Little Help needed standing up from a chair using your arms (e.g., wheelchair or bedside chair)?: A Little Help needed to walk in hospital room?: A Little Help needed climbing 3-5 steps  with a railing? : A Little 6 Click Score: 18    End of Session Equipment Utilized During Treatment: Gait belt Activity Tolerance: Patient tolerated treatment well;Patient limited by pain Patient left: with chair alarm set;with call bell/phone within reach Nurse Communication: Mobility status PT Visit Diagnosis: Muscle weakness (generalized) (M62.81);Difficulty in walking, not elsewhere classified (R26.2);Pain Pain - Right/Left: Left Pain - part of body: Leg    Time: 2536-6440 PT Time Calculation (min) (ACUTE ONLY): 38 min   Charges:   PT Evaluation $PT Eval Low Complexity: 1 Low PT Treatments $Gait Training: 8-22 mins        Kreg Shropshire, DPT 05/10/2018, 12:18 PM

## 2018-05-10 NOTE — Care Management Important Message (Signed)
Important Message  Patient Details  Name: Heather Larson MRN: [id number] Date of Birth: 25-Apr-1924   Medicare Important Message Given:  Yes    Beverly Sessions, RN 05/10/2018, 4:48 PM

## 2018-05-10 NOTE — Progress Notes (Signed)
[name]    MR#:  [id number]  DATE OF BIRTH:  Apr 07, 1924  SUBJECTIVE:  CHIEF COMPLAINT: Patient's feeling okay no new complaints.  Shortness of breath is better weaned off to room air ,recent history of C. difficile status post fecal transplantation  REVIEW OF SYSTEMS:  CONSTITUTIONAL: No fever, fatigue or weakness.  EYES: No blurred or double vision.  EARS, NOSE, AND THROAT: No tinnitus or ear pain.  RESPIRATORY: No cough, shortness of breath, wheezing or hemoptysis.  CARDIOVASCULAR: No chest pain, orthopnea, edema.  GASTROINTESTINAL: No nausea, vomiting, diarrhea or abdominal pain.  GENITOURINARY: No dysuria, hematuria.  ENDOCRINE: No polyuria, nocturia,  HEMATOLOGY: No anemia, easy bruising or bleeding SKIN: Worsening of left leg redness MUSCULOSKELETAL: No joint pain or arthritis.   NEUROLOGIC: No tingling, numbness, weakness.  PSYCHIATRY: No anxiety or depression.   DRUG ALLERGIES:  No Known Allergies  VITALS:  Blood pressure 115/76, pulse 80, temperature (!) 97.5 F (36.4 C), temperature source Oral, resp. rate 15, height _0  (1.575 m), weight 64 kg, SpO2 98 %.  PHYSICAL EXAMINATION:  GENERAL:  85 y.o.-year-old patient lying in the bed with no acute distress.  EYES: Pupils equal, round, reactive to light and accommodation. No scleral icterus. Extraocular muscles intact.  HEENT: Head atraumatic, normocephalic. Oropharynx and nasopharynx clear.  NECK:  Supple, no jugular venous distention. No thyroid enlargement, no tenderness.  LUNGS: Normal breath sounds bilaterally, no wheezing, rales,rhonchi or crepitation. No use of accessory muscles of respiration.  CARDIOVASCULAR: S1, S2 normal. No murmurs, rubs, or gallops.  ABDOMEN: Soft, nontender, nondistended. Bowel sounds present. No organomegaly or mass.  EXTREMITIES: Left leg swelling and redness present from the foot area up to lower part  of the thigh.  Lower extremity edema present Tenderness in the left leg. No pedal edema, cyanosis, or clubbing.  NEUROLOGIC: Awake and alert and oriented x3 sensation intact. Gait not checked.  PSYCHIATRIC: The patient is alert and oriented x 3.  SKIN: No obvious rash, lesion, or ulcer.    LABORATORY PANEL:   CBC Recent Labs  Lab 05/10/18 0613  WBC 22.1*  HGB 10.2*  HCT 31.6*  PLT 261   ------------------------------------------------------------------------------------------------------------------  Chemistries  Recent Labs  Lab 05/10/18 0613  NA 127*  K 3.2*  CL 98  CO2 20*  GLUCOSE 107*  BUN 27*  CREATININE 0.88  CALCIUM 7.8*   ------------------------------------------------------------------------------------------------------------------  Cardiac Enzymes Recent Labs  Lab 05/08/18 1326  TROPONINI 0.04*   ------------------------------------------------------------------------------------------------------------------  RADIOLOGY:  Dg Tibia/fibula Left  Result Date: 05/08/2018 CLINICAL DATA:  Cellulitis of the left lower leg. Redness and swelling. Tenderness to touch. EXAM: LEFT TIBIA AND FIBULA - 2 VIEW COMPARISON:  None. FINDINGS: The tibia and fibula appear normal. No appreciable arthritic changes at the knee or ankle. Nonspecific subcutaneous edema seen throughout the lower leg. Arterial vascular calcifications. IMPRESSION: Soft tissue edema. Otherwise, normal exam. Electronically Signed   By: Lorriane Shire M.D.   On: 05/08/2018 17:15   US Venous Img Lower Unilateral Left  Result Date: 05/08/2018 CLINICAL DATA:  Left lower extremity swelling for several months with recent worsening. EXAM: LEFT LOWER EXTREMITY VENOUS DOPPLER ULTRASOUND TECHNIQUE: Gray-scale sonography with graded compression, as well as color Doppler and duplex ultrasound were performed to evaluate the lower extremity deep venous systems from the level of the common femoral vein and including  the common femoral, femoral, profunda femoral, popliteal and calf veins including the posterior  tibial, peroneal and gastrocnemius veins when visible. The superficial great saphenous vein was also interrogated. Spectral Doppler was utilized to evaluate flow at rest and with distal augmentation maneuvers in the common femoral, femoral and popliteal veins. COMPARISON:  Bilateral lower extremity venous Doppler scan dated 11/18/2017 FINDINGS: Contralateral Common Femoral Vein: Respiratory phasicity is normal and symmetric with the symptomatic side. No evidence of thrombus. Normal compressibility. Common Femoral Vein: No evidence of thrombus. Normal compressibility, respiratory phasicity and response to augmentation. Saphenofemoral Junction: No evidence of thrombus. Normal compressibility and flow on color Doppler imaging. Profunda Femoral Vein: No evidence of thrombus. Normal compressibility and flow on color Doppler imaging. Femoral Vein: No evidence of thrombus. Normal compressibility, respiratory phasicity and response to augmentation. Popliteal Vein: No evidence of thrombus. Normal compressibility, respiratory phasicity and response to augmentation. Calf Veins: Nonvisualization of the left calf deep veins, potentially due to prominent overlying subcutaneous edema. Superficial Great Saphenous Vein: No evidence of thrombus. Normal compressibility. Venous Reflux:  None. Other Findings:  None. IMPRESSION: No evidence of deep venous thrombosis in the left lower extremity, with limited evaluation of the left calf due to overlying subcutaneous edema. Electronically Signed   By: Ilona Sorrel M.D.   On: 05/08/2018 19:50    EKG:   Orders placed or performed in visit on 04/29/18  . 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  Patient met septic criteria at the time of admission with elevated lactic acid and leukocytosis Broad-spectrum IV antibiotics Zosyn and vancomycin changed to IV Rocephin as cultures with Streptococcus group G susceptibilities to follow Follow-up lactic acid level 2.7-1.7.  Will follow lactic acid and procalcitonin level IV fluids diligently in view of history of heart failure Intensivist consultation as needed Urine culture with no growth  -Left leg cellulitis Broad-spectrum IV antibiotics vancomycin and Zosyn were started which are changed to IV Rocephin 05/09/2018  Doppler ultrasound of left leg negative DVT Surgery consultation-seen by Dr. Lysle Pearl, necrotizing fasciitis is very unlikely no surgical interventions needed at this time continue close monitoring and conservative management  -Congestion flu test is negative we will get chest x-ray and respiratory panel  -Hyponatremia sodium at 127 patient is asymptomatic continue close monitoring and check BMP in a.m.  -Chronic atrial fibrillation Continue Xarelto for anticoagulation  -History of C. difficile colitis status post fecal transplantation recently  -Hyperlipidemia Continue statin medication  -Acute on chronic back pain History of vertebral compression fractures Pain management with IV morphine as needed  -Chronic congestive heart failure Medical management to continue  -DVT prophylaxis On anticoagulation with Xarelto  PT is recommending home health PT and 24-hour supervision   All the records are reviewed and case discussed with Care Management/Social Workerr. Management plans discussed with the patient, family and they are in agreement.  CODE STATUS: DNR   TOTAL TIME TAKING CARE  OF THIS PATIENT: 36  minutes.   POSSIBLE D/C IN 2-3  DAYS, DEPENDING ON CLINICAL CONDITION.  Note: This dictation was prepared with Dragon dictation along with smaller phrase technology. Any transcriptional errors that result from this process are unintentional.   Nicholes Mango M.D on 05/10/2018 at 4:52 PM  Between 7am to 6pm - Pager - [phone] After 6pm go to [email or web address] - password [email or web address] [facility]  [facility]  Office  [phone]  CC: Primary care physician; Pleas Koch, NP

## 2018-05-10 NOTE — Progress Notes (Signed)
Subjective:  CC:  Heather Larson is a 85 y.o. female  Hospital stay day 2,   cellulitis  HPI: No issues overnight.  States pain continuing to improve.  ROS:  General: Denies weight loss, weight gain, fatigue, fevers, chills, and night sweats. Heart: Denies chest pain, palpitations, or decreased activity tolerance. Respiratory: Denies breathing difficulty, shortness of breath, wheezing, cough, and sputum. GI: Denies change in appetite, heartburn, nausea, vomiting, constipation, diarrhea, and blood in stool. GU: Denies difficulty urinating, pain with urinating, urgency, frequency, blood in urine.   Objective:      Temp:  [98.3 F (36.8 C)-98.4 F (36.9 C)] 98.3 F (36.8 C) (01/10 0456) Pulse Rate:  [92-122] 122 (01/10 0456) Resp:  [16-20] 19 (01/10 0456) BP: (110-147)/(57-82) 147/82 (01/10 0456) SpO2:  [96 %-100 %] 100 % (01/10 0456)     Height: 5\' 2"  (157.5 cm) Weight: 64 kg BMI (Calculated): 25.8   Intake/Output this shift:   Intake/Output Summary (Last 24 hours) at 05/10/2018 0905 Last data filed at 05/09/2018 2213 Gross per 24 hour  Intake 111.01 ml  Output 0 ml  Net 111.01 ml        Constitutional :  alert, cooperative, appears stated age and no distress  Respiratory:  clear to auscultation bilaterally  Cardiovascular:  irregularly irregular rhythm  Gastrointestinal: soft, suprapubic region cellulitis' degree of erythema looks improved compared to yesterday. Recding erythema in all areas now.  still has tenderness in LLE..   Skin: Cool and moist.   Psychiatric: Normal affect, non-agitated, not confused       LABS:  CMP Latest Ref Rng & Units 05/10/2018 05/09/2018 05/08/2018  Glucose 70 - 99 mg/dL 107(H) 103(H) 126(H)  BUN 8 - 23 mg/dL 27(H) 23 20  Creatinine 0.44 - 1.00 mg/dL 0.88 0.80 0.78  Sodium 135 - 145 mmol/L 127(L) 127(L) 130(L)  Potassium 3.5 - 5.1 mmol/L 3.2(L) 3.6 3.6  Chloride 98 - 111 mmol/L 98 98 97(L)  CO2 22 - 32 mmol/L 20(L) 20(L) 22  Calcium 8.9  - 10.3 mg/dL 7.8(L) 7.7(L) 9.0  Total Protein 6.5 - 8.1 g/dL - - -  Total Bilirubin 0.3 - 1.2 mg/dL - - -  Alkaline Phos 38 - 126 U/L - - -  AST 15 - 41 U/L - - -  ALT 0 - 44 U/L - - -   CBC Latest Ref Rng & Units 05/10/2018 05/09/2018 05/08/2018  WBC 4.0 - 10.5 K/uL 22.1(H) 16.0(H) 28.4(H)  Hemoglobin 12.0 - 15.0 g/dL 10.2(L) 10.7(L) 12.1  Hematocrit 36.0 - 46.0 % 31.6(L) 32.9(L) 37.3  Platelets 150 - 400 K/uL 261 285 353    RADS: CLINICAL DATA:  Left lower extremity swelling for several months with recent worsening.  EXAM: LEFT LOWER EXTREMITY VENOUS DOPPLER ULTRASOUND  TECHNIQUE: Gray-scale sonography with graded compression, as well as color Doppler and duplex ultrasound were performed to evaluate the lower extremity deep venous systems from the level of the common femoral vein and including the common femoral, femoral, profunda femoral, popliteal and calf veins including the posterior tibial, peroneal and gastrocnemius veins when visible. The superficial great saphenous vein was also interrogated. Spectral Doppler was utilized to evaluate flow at rest and with distal augmentation maneuvers in the common femoral, femoral and popliteal veins.  COMPARISON:  Bilateral lower extremity venous Doppler scan dated 11/18/2017  FINDINGS: Contralateral Common Femoral Vein: Respiratory phasicity is normal and symmetric with the symptomatic side. No evidence of thrombus. Normal compressibility.  Common Femoral Vein: No evidence of thrombus.  Normal compressibility, respiratory phasicity and response to augmentation.  Saphenofemoral Junction: No evidence of thrombus. Normal compressibility and flow on color Doppler imaging.  Profunda Femoral Vein: No evidence of thrombus. Normal compressibility and flow on color Doppler imaging.  Femoral Vein: No evidence of thrombus. Normal compressibility, respiratory phasicity and response to augmentation.  Popliteal Vein: No evidence  of thrombus. Normal compressibility, respiratory phasicity and response to augmentation.  Calf Veins: Nonvisualization of the left calf deep veins, potentially due to prominent overlying subcutaneous edema.  Superficial Great Saphenous Vein: No evidence of thrombus. Normal compressibility.  Venous Reflux:  None.  Other Findings:  None.  IMPRESSION: No evidence of deep venous thrombosis in the left lower extremity, with limited evaluation of the left calf due to overlying subcutaneous edema.   Electronically Signed   By: Ilona Sorrel M.D.   On: 05/08/2018 19:50 Assessment:   BLE cellulitis and suprapubic cellulitis.  Clinical exam much better today, but she did have increased leukocytosis today.  She was complaining of slight SOB and is now on [place], with episode of cough during exam.  Recommend workup for respiratory issues, since worsenign wbc is likely not from the resolving cellulitis.  No report of frequent diarrhea either but will have to consider c.diff as well due to her history.  Surgery will peripherally follow.  Please call with any questions or concerns.

## 2018-05-11 LAB — RESPIRATORY PANEL BY PCR
Adenovirus: NOT DETECTED
Bordetella pertussis: NOT DETECTED
Chlamydophila pneumoniae: NOT DETECTED
Coronavirus 229E: NOT DETECTED
Coronavirus HKU1: NOT DETECTED
Coronavirus NL63: NOT DETECTED
Coronavirus OC43: NOT DETECTED
INFLUENZA A-RVPPCR: NOT DETECTED
Influenza B: NOT DETECTED
Metapneumovirus: NOT DETECTED
Mycoplasma pneumoniae: NOT DETECTED
PARAINFLUENZA VIRUS 3-RVPPCR: NOT DETECTED
PARAINFLUENZA VIRUS 4-RVPPCR: NOT DETECTED
Parainfluenza Virus 1: NOT DETECTED
Parainfluenza Virus 2: NOT DETECTED
RESPIRATORY SYNCYTIAL VIRUS-RVPPCR: NOT DETECTED
Rhinovirus / Enterovirus: NOT DETECTED

## 2018-05-11 LAB — CBC
HCT: 30.6 % — ABNORMAL LOW (ref 36.0–46.0)
Hemoglobin: 9.9 g/dL — ABNORMAL LOW (ref 12.0–15.0)
MCH: 26.6 pg (ref 26.0–34.0)
MCHC: 32.4 g/dL (ref 30.0–36.0)
MCV: 82.3 fL (ref 80.0–100.0)
Platelets: 274 10*3/uL (ref 150–400)
RBC: 3.72 MIL/uL — ABNORMAL LOW (ref 3.87–5.11)
RDW: 16.3 % — ABNORMAL HIGH (ref 11.5–15.5)
WBC: 20 10*3/uL — ABNORMAL HIGH (ref 4.0–10.5)
nRBC: 0 % (ref 0.0–0.2)

## 2018-05-11 LAB — CULTURE, BLOOD (ROUTINE X 2)

## 2018-05-11 LAB — BASIC METABOLIC PANEL
ANION GAP: 7 (ref 5–15)
BUN: 22 mg/dL (ref 8–23)
CHLORIDE: 98 mmol/L (ref 98–111)
CO2: 23 mmol/L (ref 22–32)
Calcium: 7.9 mg/dL — ABNORMAL LOW (ref 8.9–10.3)
Creatinine, Ser: 0.67 mg/dL (ref 0.44–1.00)
GFR calc Af Amer: >60 mL/min (ref >60)
GFR calc non Af Amer: >60 mL/min (ref >60)
Glucose, Bld: 108 mg/dL — ABNORMAL HIGH (ref 70–99)
Potassium: 3.2 mmol/L — ABNORMAL LOW (ref 3.5–5.1)
Sodium: 128 mmol/L — ABNORMAL LOW (ref 135–145)

## 2018-05-11 MED ORDER — POTASSIUM CHLORIDE CRYS ER 20 MEQ PO TBCR
40.0000 meq | EXTENDED_RELEASE_TABLET | Freq: Once | ORAL | Status: AC
  Administered 2018-05-11: 40 meq via ORAL
  Filled 2018-05-11: qty 2

## 2018-05-11 NOTE — Progress Notes (Signed)
[facility] [place] NAME: Heather Larson    MR#:  [id number]  DATE OF BIRTH:  Apr 03, 1924  SUBJECTIVE:  CHIEF COMPLAINT: Patient's feeling okay no new complaints.  To daughters are at bedside shortness of breath is better weaned off to room air ,recent history of C. difficile status post fecal transplantation  REVIEW OF SYSTEMS:  CONSTITUTIONAL: No fever, fatigue or weakness.  EYES: No blurred or double vision.  EARS, NOSE, AND THROAT: No tinnitus or ear pain.  RESPIRATORY: No cough, shortness of breath, wheezing or hemoptysis.  CARDIOVASCULAR: No chest pain, orthopnea, edema.  GASTROINTESTINAL: No nausea, vomiting, diarrhea or abdominal pain.  GENITOURINARY: No dysuria, hematuria.  ENDOCRINE: No polyuria, nocturia,  HEMATOLOGY: No anemia, easy bruising or bleeding SKIN: Worsening of left leg redness MUSCULOSKELETAL: No joint pain or arthritis.   NEUROLOGIC: No tingling, numbness, weakness.  PSYCHIATRY: No anxiety or depression.   DRUG ALLERGIES:  No Known Allergies  VITALS:  Blood pressure 118/61, pulse 88, temperature 98.5 F (36.9 C), temperature source Oral, resp. rate 14, height _0  (1.575 m), weight 64 kg, SpO2 98 %.  PHYSICAL EXAMINATION:  GENERAL:  85 y.o.-year-old patient lying in the bed with no acute distress.  EYES: Pupils equal, round, reactive to light and accommodation. No scleral icterus. Extraocular muscles intact.  HEENT: Head atraumatic, normocephalic. Oropharynx and nasopharynx clear.  NECK:  Supple, no jugular venous distention. No thyroid enlargement, no tenderness.  LUNGS: Normal breath sounds bilaterally, no wheezing, rales,rhonchi or crepitation. No use of accessory muscles of respiration.  CARDIOVASCULAR: S1, S2 normal. No murmurs, rubs, or gallops.  ABDOMEN: Soft, nontender, nondistended. Bowel sounds present. No organomegaly or mass.  EXTREMITIES: Left leg swelling and redness present from the foot  area up to lower part of the thigh.  Lower extremity edema present Tenderness in the left leg. No pedal edema, cyanosis, or clubbing.  NEUROLOGIC: Awake and alert and oriented x3 sensation intact. Gait not checked.  PSYCHIATRIC: The patient is alert and oriented x 3.  SKIN: No obvious rash, lesion, or ulcer.    LABORATORY PANEL:   CBC Recent Labs  Lab 05/11/18 0446  WBC 20.0*  HGB 9.9*  HCT 30.6*  PLT 274   ------------------------------------------------------------------------------------------------------------------  Chemistries  Recent Labs  Lab 05/11/18 0446  NA 128*  K 3.2*  CL 98  CO2 23  GLUCOSE 108*  BUN 22  CREATININE 0.67  CALCIUM 7.9*   ------------------------------------------------------------------------------------------------------------------  Cardiac Enzymes Recent Labs  Lab 05/08/18 1326  TROPONINI 0.04*   ------------------------------------------------------------------------------------------------------------------  RADIOLOGY:  Dg Chest 2 View  Result Date: 05/10/2018 CLINICAL DATA:  Shortness of breath today. EXAM: CHEST - 2 VIEW COMPARISON:  Two-view chest x-ray 05/08/2018 FINDINGS: The heart is enlarged. Interstitial edema and bilateral effusions are similar the prior study. A more lordotic view is obtained. Bibasilar airspace disease likely reflects atelectasis. No new airspace disease is present. Atherosclerotic calcifications are present in the aorta. Exaggerated thoracic kyphosis is associated with multiple lower thoracic fractures. These are unchanged. IMPRESSION: 1. Stable cardiomegaly with mild edema and effusions, compatible with congestive heart failure. 2. Bibasilar airspace disease likely reflects atelectasis. Electronically Signed   By: San Morelle M.D.   On: 05/10/2018 17:47    EKG:   Orders placed or performed in visit on 04/29/18  . 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  Patient met septic criteria at the time of admission with elevated lactic acid and leukocytosis Clinically getting better Broad-spectrum IV antibiotics Zosyn and vancomycin changed to IV Rocephin as cultures with Streptococcus group G sensitive to Rocephin will change to Augmentin at the time of discharge Follow-up lactic acid level 2.7-1.7.  Will follow lactic acid and procalcitonin level IV fluids diligently in view of history of heart failure Intensivist consultation as needed Urine culture with no growth  -Left leg cellulitis Broad-spectrum IV antibiotics vancomycin and Zosyn were started which are changed to IV Rocephin 05/09/2018 , clinically improving Doppler ultrasound of left leg negative DVT Surgery consultation-seen by Dr. Lysle Pearl, necrotizing fasciitis is very unlikely no surgical interventions needed at this time continue close monitoring and conservative management  -Congestion flu test is negative we will get chest x-ray and respiratory panel  -Hyponatremia sodium at 127-128 patient is asymptomatic continue close monitoring and check BMP in a.m.  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 Kevan Ny assisted living facility with home health   All the records are reviewed and case discussed with Care Management/Social Workerr. Management plans discussed with the patient, family and they are in agreement.  CODE STATUS: DNR   TOTAL TIME TAKING CARE OF THIS PATIENT: 36  minutes.   POSSIBLE D/C IN 1  DAYS, DEPENDING ON CLINICAL CONDITION.  Note: This dictation was prepared with Dragon dictation along with smaller phrase technology. Any transcriptional errors that result from this process are unintentional.   Nicholes Mango M.D on 05/11/2018 at 4:13 PM  Between 7am to 6pm - Pager - [phone] After 6pm go to [email or web address] - password [email or web address] [facility]  [facility]  Office  [phone]  CC: Primary care physician; Pleas Koch, NP

## 2018-05-12 MED ORDER — SENNOSIDES-DOCUSATE SODIUM 8.6-50 MG PO TABS: 1.0000 | ORAL_TABLET | Freq: Every evening | ORAL | Status: DC | PRN

## 2018-05-12 MED ORDER — HYDROCODONE-ACETAMINOPHEN 5-325 MG PO TABS: 1.0000 | ORAL_TABLET | Freq: Four times a day (QID) | ORAL | 0 refills | Status: DC | PRN

## 2018-05-12 MED ORDER — AMOXICILLIN-POT CLAVULANATE 875-125 MG PO TABS: 1.0000 | ORAL_TABLET | Freq: Two times a day (BID) | ORAL | Status: DC

## 2018-05-12 MED ORDER — CALCIUM CARBONATE ANTACID 500 MG PO CHEW: 3.0000 | CHEWABLE_TABLET | Freq: Two times a day (BID) | ORAL | 0 refills | Status: DC

## 2018-05-12 MED ORDER — AMOXICILLIN-POT CLAVULANATE 875-125 MG PO TABS: 1.0000 | ORAL_TABLET | Freq: Two times a day (BID) | ORAL | 0 refills | Status: DC

## 2018-05-12 MED ORDER — MUPIROCIN 2 % EX OINT: 1.0000 "application " | TOPICAL_OINTMENT | Freq: Two times a day (BID) | CUTANEOUS | 0 refills | Status: AC

## 2018-05-12 MED ORDER — SODIUM CHLORIDE 1 G PO TABS: 1.0000 g | ORAL_TABLET | Freq: Three times a day (TID) | ORAL | 0 refills | Status: AC

## 2018-05-12 MED ORDER — ACETAMINOPHEN 325 MG PO TABS: 650.0000 mg | ORAL_TABLET | Freq: Four times a day (QID) | ORAL | Status: AC | PRN

## 2018-05-12 NOTE — Progress Notes (Signed)
Discharge teaching given to patient, patient verbalized understanding and had no questions. Patient IV removed. Patient will be transported home by family. All patient belongings gathered prior to leaving.  

## 2018-05-12 NOTE — Care Management Note (Signed)
Case Management Note  Patient Details  Name: Heather Larson MRN: [id number] Date of Birth: November 18, 1923  Subjective/Objective:  Patient to be discharged per MD order. Orders in place for home health services. Patient previously open with [facility] home [facility]. Patient prefers to resume care. Resumption referral confirmed with Helene Kelp for PT and RN. Family to transport.                 Action/Plan:   Expected Discharge Date:  05/12/18               Expected Discharge Plan:  [place]  In-House Referral:     Discharge planning Services  CM Consult  Post Acute Care Choice:  Home Health Choice offered to:  Patient  DME Arranged:    DME Agency:     HH Arranged:  RN, PT [place] Agency:  [facility] (formerly [employer])  Status of Service:  Completed, signed off  If discussed at [employer], dates discussed:    Additional Comments:  Latanya Maudlin, RN 05/12/2018, 1:34 PM

## 2018-05-12 NOTE — Discharge Instructions (Signed)

## 2018-05-12 NOTE — Discharge Summary (Signed)
[place] [facility] [place] NAME: Heather Larson    MR#:  [id number]  DATE OF BIRTH:  December 11, 1923  DATE OF ADMISSION:  05/08/2018 ADMITTING PHYSICIAN: [name], MD  DATE OF DISCHARGE: 05/12/2018 PRIMARY CARE PHYSICIAN: Pleas Koch, NP    ADMISSION DIAGNOSIS:  Lactic acid blood increased [R79.89] DVT (deep venous thrombosis) (HCC) [I82.409] Edema of left lower extremity [R60.0] Cellulitis of other specified site [L03.818] Sepsis ([place]) [A41.9]  DISCHARGE DIAGNOSIS:  Active Problems:   Cellulitis   Sepsis ([place])   SECONDARY DIAGNOSIS:   Past Medical History:  Diagnosis Date  . Anemia   . Atrial fibrillation, permanent    on Xarelto  . Bilateral lower extremity edema   . Bilateral lower leg cellulitis 11/11/2017  . Cancer [facility])    breast cancer at age 54 and skin  . Cellulitis    lower abdomen  . CHF (congestive heart failure) ([place])   . Diarrhea 01/11/2018  . Dysrhythmia    a-fib  . Hyperlipidemia   . Hypertension   . Hypertension   . Incontinence   . Low sodium levels   . Macular degeneration   . 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  Patient met septic criteria at the time of admission with elevated lactic acid and leukocytosis Clinically getting better Broad-spectrum IV antibiotics Zosyn and vancomycin changed to IV Rocephin as cultures with Streptococcus group G sensitive to Rocephin will chaning to Augmentin at the time of discharge Follow-up lactic acid level  2.7-1.7.   IV fluids given diligently in view of history of heart failure Intensivist consultation as needed Urine culture with no growth  -Left leg cellulitis Broad-spectrum IV antibiotics vancomycin and Zosyn were started which are changed to IV Rocephin 05/09/2018 , clinically improving discharge with p.o. Augmentin for 5 more days Doppler ultrasound of left leg negative DVT Surgery consultation-seen by Dr. 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 recently  -Hyperlipidemia Continue statin medication  -Acute on chronic back pain History of vertebral compression fractures Pain management with IV morphine as needed  -Chronic congestive heart failure Medical management to continue  -DVT prophylaxis On anticoagulation with Xarelto  PT is recommending home health PT and 24-hour supervision Disposition [facility] with home health DISCHARGE CONDITIONS:  stable  CONSULTS OBTAINED:     PROCEDURES  None   DRUG ALLERGIES:  No Known Allergies  DISCHARGE MEDICATIONS:   Allergies as of 05/12/2018   No Known Allergies     Medication List    TAKE these medications   acetaminophen 325 MG tablet Commonly known as:  TYLENOL Take 2 tablets (650 mg total) by mouth every 6 (six) hours as needed for mild pain (or Fever >/= 101).   acidophilus Caps  capsule Take 2 capsules by mouth 3 (three) times daily. What changed:    how much to take  when to take this   albuterol 108 (90 Base) MCG/ACT inhaler Commonly known as:   PROVENTIL HFA;VENTOLIN HFA Inhale 2 puffs into the lungs every 6 (six) hours as needed for wheezing or shortness of breath.   amoxicillin-clavulanate 875-125 MG tablet Commonly known as:  AUGMENTIN Take 1 tablet by mouth every 12 (twelve) hours.   atorvastatin 10 MG tablet Commonly known as:  LIPITOR Take 1 tablet (10 mg total) by mouth daily.   brimonidine 0.2 % ophthalmic solution Commonly known as:  ALPHAGAN Place 1 drop into both eyes 3 (three) times daily.   calcium carbonate 1500 (600 Ca) MG Tabs tablet Commonly known as:  OSCAL Take by mouth 2 (two) times daily with a meal.   calcium carbonate 500 MG chewable tablet Commonly known as:  TUMS - dosed in mg elemental calcium Chew 3 tablets (600 mg of elemental calcium total) by mouth 2 (two) times daily with a meal for 15 days.   dorzolamide 2 % ophthalmic solution Commonly known as:  TRUSOPT Place 1 drop into both eyes 3 (three) times daily.   furosemide 20 MG tablet Commonly known as:  LASIX TAKE 1 TABLET(20 MG) BY MOUTH EVERY OTHER DAY What changed:  See the new instructions.   HYDROcodone-acetaminophen 5-325 MG tablet Commonly known as:  NORCO/VICODIN Take 1 tablet by mouth every 6 (six) hours as needed for moderate pain.   metoprolol succinate 100 MG 24 hr tablet Commonly known as:  TOPROL-XL Take 1 tablet (100 mg total) by mouth daily. Take with or immediately following a meal.   MULTIVITAMIN ADULT Tabs Take 1 tablet by mouth daily.   mupirocin ointment 2 % Commonly known as:  BACTROBAN Place 1 application into the nose 2 (two) times daily for 3 days.   NUTRITIONAL SHAKE PLUS PROTEIN PO Take by mouth.   pantoprazole 40 MG tablet Commonly known as:  PROTONIX Take 1 tablet (40 mg total) by mouth daily.   Rivaroxaban 15 MG Tabs tablet Commonly known as:  XARELTO Take 1 tablet (15 mg total) by mouth daily.   senna-docusate 8.6-50 MG tablet Commonly known as:  Senokot-S Take 1 tablet by mouth at  bedtime as needed for mild constipation.   sodium chloride 1 g tablet Take 1 tablet (1 g total) by mouth 3 (three) times daily with meals for 10 doses.   TRAVATAN Z 0.004 % Soln ophthalmic solution Generic drug:  Travoprost (BAK Free) Place 1 drop into both eyes at bedtime.   vitamin C 500 MG tablet Commonly known as:  ASCORBIC ACID Take 500 mg by mouth daily.        DISCHARGE INSTRUCTIONS:  Follow-up with primary care physician in 3 to 4 days   DIET:  Cardiac diet  DISCHARGE CONDITION:  Fair  ACTIVITY:  Activity as tolerated per PT  OXYGEN:  Home Oxygen: No.   Oxygen Delivery: room air  DISCHARGE LOCATION:  Assisted living facility with home health  If you experience worsening of your admission symptoms, develop shortness of breath, life threatening emergency, suicidal or homicidal thoughts you must seek medical attention immediately by calling 911 or calling your MD immediately  if symptoms less severe.  You Must read complete instructions/literature along with all the possible adverse reactions/side effects for all the Medicines you take and that have been prescribed to you. Take any new Medicines after you have completely  understood and accpet all the possible adverse reactions/side effects.   Please note  You were cared for by a hospitalist during your hospital stay. If you have any questions about your discharge medications or the care you received while you were in the hospital after you are discharged, you can call the unit and asked to speak with the hospitalist on call if the hospitalist that took care of you is not available. Once you are discharged, your primary care physician will handle any further medical issues. Please note that NO REFILLS for any discharge medications will be authorized once you are discharged, as it is imperative that you return to your primary care physician (or establish a relationship with a primary care physician if you do not have  one) for your aftercare needs so that they can reassess your need for medications and monitor your lab values.     Today  No chief complaint on file.  Patient is doing fine.  Wants to be discharged.  Feeling much better  ROS:  CONSTITUTIONAL: Denies fevers, chills. Denies any fatigue, weakness.  EYES: Denies blurry vision, double vision, eye pain. EARS, NOSE, THROAT: Denies tinnitus, ear pain, hearing loss. RESPIRATORY: Denies cough, wheeze, shortness of breath.  CARDIOVASCULAR: Denies chest pain, palpitations, edema.  GASTROINTESTINAL: Denies nausea, vomiting, diarrhea, abdominal pain. Denies bright red blood per rectum. GENITOURINARY: Denies dysuria, hematuria. ENDOCRINE: Denies nocturia or thyroid problems. HEMATOLOGIC AND LYMPHATIC: Denies easy bruising or bleeding. SKIN: Lower extremity redness is improving  mUSCULOSKELETAL: Denies pain in neck, back, shoulder, knees, hips or arthritic symptoms.  NEUROLOGIC: Denies paralysis, paresthesias.  PSYCHIATRIC: Denies anxiety or depressive symptoms.   VITAL SIGNS:  Blood pressure (!) 154/96, pulse 89, temperature 98 F (36.7 C), temperature source Oral, resp. rate 18, height '5\' 2"'  (1.575 m), weight 64 kg, SpO2 96 %.  I/O:    Intake/Output Summary (Last 24 hours) at 05/12/2018 1152 Last data filed at 05/12/2018 0930 Gross per 24 hour  Intake 194.64 ml  Output 1400 ml  Net -1205.36 ml    PHYSICAL EXAMINATION:  GENERAL:  85 y.o.-year-old patient lying in the bed with no acute distress.  EYES: Pupils equal, round, reactive to light and accommodation. No scleral icterus. Extraocular muscles intact.  HEENT: Head atraumatic, normocephalic. Oropharynx and nasopharynx clear.  NECK:  Supple, no jugular venous distention. No thyroid enlargement, no tenderness.  LUNGS: Normal breath sounds bilaterally, no wheezing, rales,rhonchi or crepitation. No use of accessory muscles of respiration.  CARDIOVASCULAR: S1, S2 normal. No murmurs, rubs,  or gallops.  ABDOMEN: Soft, non-tender, non-distended. Bowel sounds present. No organomegaly or mass.  EXTREMITIES: No pedal edema, cyanosis, or clubbing.  Bilateral lower extremity erythema is significantly better NEUROLOGIC: Awake alert and oriented x3 sensation intact. Gait not checked.  PSYCHIATRIC: The patient is alert and oriented x 3.  SKIN: No obvious rash, lesion, or ulcer.   DATA REVIEW:   CBC Recent Labs  Lab 05/11/18 0446  WBC 20.0*  HGB 9.9*  HCT 30.6*  PLT 274    Chemistries  Recent Labs  Lab 05/11/18 0446  NA 128*  K 3.2*  CL 98  CO2 23  GLUCOSE 108*  BUN 22  CREATININE 0.67  CALCIUM 7.9*    Cardiac Enzymes Recent Labs  Lab 05/08/18 1326  TROPONINI 0.04*    Microbiology Results  Results for orders placed or performed during the hospital encounter of 05/08/18  Culture, blood (routine x 2)     Status: Abnormal   Collection Time:  05/08/18  4:32 PM  Result Value Ref Range Status   Specimen Description   Final    BLOOD UPPER ARM Performed at [facility], [address] [place], [place] [address]    Special Requests   Final    BOTTLES DRAWN AEROBIC AND ANAEROBIC Blood Culture results may not be optimal due to an inadequate volume of blood received in culture bottles Performed at [facility], [address] [place], [place] [address]    Culture  Setup Time   Final    GRAM POSITIVE COCCI AEROBIC BOTTLE ONLY CRITICAL RESULT CALLED TO, READ BACK BY AND VERIFIED WITH: MATT [facility] 05/09/18 @ 0250  [place] Performed at [facility], [place] [address] [place], [place] [address]    Culture STREPTOCOCCUS GROUP G (A)  Final   Report Status 05/11/2018 FINAL  Final   Organism ID, Bacteria STREPTOCOCCUS GROUP G  Final      Susceptibility   Streptococcus group g - MIC*    CLINDAMYCIN <=0.25 SENSITIVE Sensitive     AMPICILLIN <=0.25 SENSITIVE Sensitive     ERYTHROMYCIN <=0.12 SENSITIVE Sensitive     VANCOMYCIN 0.25 SENSITIVE Sensitive      CEFTRIAXONE <=0.12 SENSITIVE Sensitive     LEVOFLOXACIN 0.5 SENSITIVE Sensitive     PENICILLIN Value in next row Sensitive      SENSITIVE<=0.06    * STREPTOCOCCUS GROUP G  Culture, blood (routine x 2)     Status: Abnormal   Collection Time: 05/08/18  4:32 PM  Result Value Ref Range Status   Specimen Description   Final    BLOOD LEFT HAND Performed at [facility], [address], [place], [place] [address]    Special Requests   Final    BOTTLES DRAWN AEROBIC AND ANAEROBIC Blood Culture results may not be optimal due to an inadequate volume of blood received in culture bottles Performed at [facility], [address] [place], [place] [address]    Culture  Setup Time   Final    GRAM POSITIVE COCCI AEROBIC BOTTLE ONLY CRITICAL RESULT CALLED TO, READ BACK BY AND VERIFIED WITH: MATT [facility] 05/09/18 @ 0250  [place] Performed at [facility], [address] [place], [place] [address]    Culture (A)  Final    STREPTOCOCCUS GROUP G SUSCEPTIBILITIES PERFORMED ON PREVIOUS CULTURE WITHIN THE LAST 5 DAYS. Performed at [facility], [place] [address], [place], [place] [address]    Report Status 05/11/2018 FINAL  Final  Urine culture     Status: None   Collection Time: 05/08/18  4:32 PM  Result Value Ref Range Status   Specimen Description   Final    URINE, RANDOM Performed at [facility], [address], [place], [place] [address]    Special Requests   Final    NONE Performed at [facility], [address] [place], [place] [address]    Culture   Final    NO GROWTH Performed at [facility], [place] [address], [place],  [address]    Report Status 05/10/2018 FINAL  Final  Blood Culture ID Panel (Reflexed)     Status: Abnormal   Collection Time: 05/08/18  4:32 PM  Result Value Ref Range Status   Enterococcus species NOT DETECTED NOT DETECTED Final   Listeria monocytogenes NOT DETECTED NOT DETECTED Final    Staphylococcus species NOT DETECTED NOT DETECTED Final   Staphylococcus aureus (BCID) NOT DETECTED NOT DETECTED  Final   Streptococcus species DETECTED (A) NOT DETECTED Final    Comment: Not Enterococcus species, Streptococcus agalactiae, Streptococcus pyogenes, or Streptococcus pneumoniae. CRITICAL RESULT CALLED TO, READ BACK BY AND VERIFIED WITH: MATT [facility] 05/09/18 @ 0250  [place]    Streptococcus agalactiae NOT DETECTED NOT DETECTED Final   Streptococcus pneumoniae NOT DETECTED NOT DETECTED Final   Streptococcus pyogenes NOT DETECTED NOT DETECTED Final   Acinetobacter baumannii NOT DETECTED NOT DETECTED Final   Enterobacteriaceae species NOT DETECTED NOT DETECTED Final   Enterobacter cloacae complex NOT DETECTED NOT DETECTED Final   Escherichia coli NOT DETECTED NOT DETECTED Final   Klebsiella oxytoca NOT DETECTED NOT DETECTED Final   Klebsiella pneumoniae NOT DETECTED NOT DETECTED Final   Proteus species NOT DETECTED NOT DETECTED Final   Serratia marcescens NOT DETECTED NOT DETECTED Final   Haemophilus influenzae NOT DETECTED NOT DETECTED Final   Neisseria meningitidis NOT DETECTED NOT DETECTED Final   Pseudomonas aeruginosa NOT DETECTED NOT DETECTED Final   Candida albicans NOT DETECTED NOT DETECTED Final   Candida glabrata NOT DETECTED NOT DETECTED Final   Candida krusei NOT DETECTED NOT DETECTED Final   Candida parapsilosis NOT DETECTED NOT DETECTED Final   Candida tropicalis NOT DETECTED NOT DETECTED Final    Comment: Performed at [facility], [address] [place], [place] [address]  MRSA PCR Screening     Status: Abnormal   Collection Time: 05/09/18  6:00 AM  Result Value Ref Range Status   MRSA by PCR POSITIVE (A) NEGATIVE Final    Comment:        The GeneXpert MRSA Assay (FDA approved for NASAL specimens only), is one component of a comprehensive MRSA colonization surveillance program. It is not intended to diagnose MRSA infection nor to guide or monitor  treatment for MRSA infections. RESULT CALLED TO, READ BACK BY AND VERIFIED WITH: CALLED TO [place] RN '@[id number]'  05/09/2018 [facility] Performed at [facility], [address] [place],  [address]   Respiratory Panel by PCR     Status: None   Collection Time: 05/10/18  4:59 PM  Result Value Ref Range Status   Adenovirus NOT DETECTED NOT DETECTED Final   Coronavirus 229E NOT DETECTED NOT DETECTED Final   Coronavirus HKU1 NOT DETECTED NOT DETECTED Final   Coronavirus NL63 NOT DETECTED NOT DETECTED Final   Coronavirus OC43 NOT DETECTED NOT DETECTED Final   Metapneumovirus NOT DETECTED NOT DETECTED Final   Rhinovirus / Enterovirus NOT DETECTED NOT DETECTED Final   Influenza A NOT DETECTED NOT DETECTED Final   Influenza B NOT DETECTED NOT DETECTED Final   Parainfluenza Virus 1 NOT DETECTED NOT DETECTED Final   Parainfluenza Virus 2 NOT DETECTED NOT DETECTED Final   Parainfluenza Virus 3 NOT DETECTED NOT DETECTED Final   Parainfluenza Virus 4 NOT DETECTED NOT DETECTED Final   Respiratory Syncytial Virus NOT DETECTED NOT DETECTED Final   Bordetella pertussis NOT DETECTED NOT DETECTED Final   Chlamydophila pneumoniae NOT DETECTED NOT DETECTED Final   Mycoplasma pneumoniae NOT DETECTED NOT DETECTED Final    RADIOLOGY:  Dg Chest 2 View  Result Date: 05/10/2018 CLINICAL DATA:  Shortness of breath today. EXAM: CHEST - 2 VIEW COMPARISON:  Two-view chest x-ray 05/08/2018 FINDINGS: The heart is enlarged. Interstitial edema and bilateral effusions are similar the prior study. A more lordotic view is obtained. Bibasilar airspace disease likely reflects atelectasis. No new airspace disease is present. Atherosclerotic calcifications are present in the aorta. Exaggerated thoracic kyphosis is associated  with multiple lower thoracic fractures. These are unchanged. IMPRESSION: 1. Stable cardiomegaly with mild edema and effusions, compatible with congestive heart failure. 2. Bibasilar airspace  disease likely reflects atelectasis. Electronically Signed   By: [place] M.D.   On: 05/10/2018 17:47   Dg Chest 2 View  Result Date: 05/08/2018 CLINICAL DATA:  Cough and fevers EXAM: CHEST - 2 VIEW COMPARISON:  03/04/2018 FINDINGS: Cardiac shadow is enlarged in size but stable. Stable mild vascular congestion is seen. Mild blunting of the costophrenic angles is again identified similar to that seen on the prior exam. Underlying atelectatic changes are noted as well. Compression deformities with prior augmentation are noted in the lower thoracic spine with increased kyphosis. No new focal abnormality is seen. IMPRESSION: Bibasilar atelectatic changes and small effusions. Stable mild vascular congestion. Electronically Signed   By: Inez Catalina M.D.   On: 05/08/2018 12:26   Dg Tibia/fibula Left  Result Date: 05/08/2018 CLINICAL DATA:  Cellulitis of the left lower leg. Redness and swelling. Tenderness to touch. EXAM: LEFT TIBIA AND FIBULA - 2 VIEW COMPARISON:  None. FINDINGS: The tibia and fibula appear normal. No appreciable arthritic changes at the knee or ankle. Nonspecific subcutaneous edema seen throughout the lower leg. Arterial vascular calcifications. IMPRESSION: Soft tissue edema. Otherwise, normal exam. Electronically Signed   By: Lorriane Shire M.D.   On: 05/08/2018 17:15   US Venous Img Lower Unilateral Left  Result Date: 05/08/2018 CLINICAL DATA:  Left lower extremity swelling for several months with recent worsening. EXAM: LEFT LOWER EXTREMITY VENOUS DOPPLER ULTRASOUND TECHNIQUE: Gray-scale sonography with graded compression, as well as color Doppler and duplex ultrasound were performed to evaluate the lower extremity deep venous systems from the level of the common femoral vein and including the common femoral, femoral, profunda femoral, popliteal and calf veins including the posterior tibial, peroneal and gastrocnemius veins when visible. The superficial great saphenous vein was  also interrogated. Spectral Doppler was utilized to evaluate flow at rest and with distal augmentation maneuvers in the common femoral, femoral and popliteal veins. COMPARISON:  Bilateral lower extremity venous Doppler scan dated 11/18/2017 FINDINGS: Contralateral Common Femoral Vein: Respiratory phasicity is normal and symmetric with the symptomatic side. No evidence of thrombus. Normal compressibility. Common Femoral Vein: No evidence of thrombus. Normal compressibility, respiratory phasicity and response to augmentation. Saphenofemoral Junction: No evidence of thrombus. Normal compressibility and flow on color Doppler imaging. Profunda Femoral Vein: No evidence of thrombus. Normal compressibility and flow on color Doppler imaging. Femoral Vein: No evidence of thrombus. Normal compressibility, respiratory phasicity and response to augmentation. Popliteal Vein: No evidence of thrombus. Normal compressibility, respiratory phasicity and response to augmentation. Calf Veins: Nonvisualization of the left calf deep veins, potentially due to prominent overlying subcutaneous edema. Superficial Great Saphenous Vein: No evidence of thrombus. Normal compressibility. Venous Reflux:  None. Other Findings:  None. IMPRESSION: No evidence of deep venous thrombosis in the left lower extremity, with limited evaluation of the left calf due to overlying subcutaneous edema. Electronically Signed   By: Ilona Sorrel M.D.   On: 05/08/2018 19:50    EKG:   Orders placed or performed in visit on 04/29/18  . EKG 12-Lead      Management plans discussed with the patient, family and they are in agreement.  CODE STATUS:     Code Status Orders  (From admission, onward)         Start     Ordered   05/09/18 0028  Do not attempt resuscitation (  DNR)  Continuous    Question Answer Comment  In the event of cardiac or respiratory ARREST Do not call a "code blue"   In the event of cardiac or respiratory ARREST Do not perform  Intubation, CPR, defibrillation or ACLS   In the event of cardiac or respiratory ARREST Use medication by any route, position, wound care, and other measures to relive pain and suffering. May use oxygen, suction and manual treatment of airway obstruction as needed for comfort.      05/09/18 0027        Code Status History    Date Active Date Inactive Code Status Order ID Comments User Context   03/05/2018 0523 03/08/2018 2145 DNR [id number]  Harrie Foreman, MD Inpatient   01/11/2018 2202 01/14/2018 2043 DNR [id number]  Dustin Flock, MD ED   11/11/2017 1804 11/27/2017 2149 DNR [id number]  Hillary Bow, MD ED   11/11/2017 1731 11/11/2017 1804 Full Code [id number]  Hillary Bow, MD ED    Advance Directive Documentation     Most Recent Value  Type of Advance Directive  Healthcare Power of [place], Living will  Pre-existing out of facility DNR order (yellow form or pink MOST form)  -  "MOST" Form in Place?  -      TOTAL TIME TAKING CARE OF THIS PATIENT: 43  minutes.   Note: This dictation was prepared with Dragon dictation along with smaller phrase technology. Any transcriptional errors that result from this process are unintentional.   '@MEC' @  on 05/12/2018 at 11:52 AM  Between 7am to 6pm - Pager - [phone]  After 6pm go to [email or web address] - password [email or web address] [facility]  [facility]  Office  [phone]  CC: Primary care physician; Pleas Koch, NP

## 2018-05-13 ENCOUNTER — Telehealth: Payer: Self-pay

## 2018-05-13 NOTE — Op Note (Signed)
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 Bonna Gains MD, MD Referring MD:         Pleas Koch (Referring MD) Medicines:            Monitored Anesthesia Care Complications:        No immediate complications. Procedure:            Pre-Anesthesia Assessment:                       - Prior to the procedure, a History and Physical was                        performed, and patient medications, allergies and                        sensitivities were reviewed. The patient's tolerance of                        previous anesthesia was reviewed.                       - The risks and benefits of the procedure and the                        sedation options and risks were discussed with the                        patient. All questions were answered and informed                        consent was obtained.                       - Patient identification and proposed procedure were                        verified prior to the procedure by the physician, the                        nurse, the anesthesiologist, the anesthetist and the                        technician. The procedure was verified in the                        pre-procedure area in the procedure room in the  endoscopy suite.                       - Prophylactic Antibiotics: The patient does not                        require prophylactic antibiotics.                       - ASA Grade Assessment: II - A patient with mild                        systemic  disease.                       - After reviewing the risks and benefits, the patient                        was deemed in satisfactory condition to undergo the                        procedure.                       - Monitored anesthesia care was determined to be                        medically necessary for this procedure based on review                        of the patient's medical history, medications, and                        prior anesthesia history.                       - The anesthesia plan was to use monitored anesthesia                        care (MAC).                       After obtaining informed consent, the colonoscope was                        passed under direct vision. Throughout the procedure,                        the patient's blood pressure, pulse, and oxygen                        saturations were monitored continuously. The                        Colonoscope was introduced through the anus and                        advanced to the the cecum, identified by appendiceal                        orifice and ileocecal valve. After obtaining informed  consent, the colonoscope was passed under direct                        vision. Throughout the procedure, the patient's blood                        pressure, pulse, and oxygen saturations were monitored                        continuously.The colonoscopy was performed with ease.                        The patient tolerated the procedure well. The quality                        of the bowel preparation was good. Findings:      The entire examined colon appeared normal. Fecal Microbiota Transplant       (Bacteriotherapy): Donor stool was prepared as per protocol.       Approximately 250 mL of the emulsified donor stool was instilled in the       cecum. A detailed colonoscopic exam could not be performed upon scope       withdrawal secondary to limited visibility from the instilled stool.        Openbiome stool was used per FDA guidelines.      Multiple small-mouthed diverticula were found in the sigmoid colon.      The retroflexed view of the distal rectum and anal verge was normal and       showed no anal or rectal abnormalities. Impression:           - Diverticulosis in the sigmoid colon.                       - Fecal Microbiota Transplant (Bacteriotherapy)                        performed in the cecum.                       - No specimens collected. Recommendation:       - Discontinue Difcid                       Follow up with Dr. Marius Ditch in clinic in 3-4 weeks                       - Continue present medications.                       - The findings and recommendations were discussed with                        the patient.                       - The findings and recommendations were discussed with                        the patient's family.                       - High fiber diet. Procedure Code(s):    ---  Professional ---                       [id number], Colonoscopy, flexible; diagnostic, including                        collection of specimen(s) by brushing or washing, when                        performed (separate procedure)                       [phone], Preparation of fecal microbiota for                        instillation, including assessment of donor specimen Diagnosis Code(s):    --- Professional ---                       A04.71, Enterocolitis due to Clostridium difficile,                        recurrent CPT copyright 2018 American Medical Association. All rights reserved. The codes documented in this report are preliminary and upon coder review may  be revised to meet current compliance requirements.  Vonda Antigua, MD [name] B. Bonna Gains MD, MD 05/13/2018 9:05:24 AM This report has been signed electronically. Number of Addenda: 0 Note Initiated On: 05/03/2018 11:01 AM Scope Withdrawal Time: 0 hours 13 minutes 9 seconds  Total Procedure Duration: 0 hours 26  minutes 23 seconds  Estimated Blood Loss: Estimated blood loss: none.      [facility]

## 2018-05-13 NOTE — Telephone Encounter (Signed)
Reviewed. My chart message sent to patient and her daughter. Will evaluate at upcoming follow up visit.

## 2018-05-13 NOTE — Telephone Encounter (Signed)
Contacted [facility] regarding sodium chloride tablets. Per pharmacist, tablets can be obtained without a prescription. Contacted daughter Shauna Hugh to advise her of this information. Daughter requested Anda Kraft review metabolic panel.

## 2018-05-13 NOTE — Telephone Encounter (Signed)
Transitional Care Management Follow-up Telephone Call    Date discharged? 05/12/2018 to independent living apartment @ [facility]  How have you been since you were released from the hospital? Recovering. Continuing with antibiotic therapy. Legs and feet look better per daughter Shauna Hugh.   Any patient concerns? Generalized weakness.    Items Reviewed:  Medications reviewed: Yes, clarification needed regarding Sodium Chloride tablets  Allergies reviewed: Yes  Dietary changes reviewed: Yes  Referrals reviewed: N/A   Functional Questionnaire:  Independent - I Dependent - D    Activities of Daily Living (ADLs):    Personal hygiene - Assistance with showers Dressing - I Eating - I Maintaining continence - Pad or brief Transferring - Ambulates with walker  Independent Activities of Daily Living (iADLs): Basic communication skills - I Transportation - Dependent Meal preparation  - Dependent Shopping - Dependent Housework - Dependent  Managing medications - Assistance/daughters Managing personal finances - Assistance/daughters   Confirmed importance and date/time of follow-up visits scheduled YES  Provider Appointment booked with PCP 05/15/18 @ 1020  Confirmed with patient if condition begins to worsen call PCP or go to the ER.  Patient was given the office number and encouraged to call back with question or concerns: YES

## 2018-05-13 NOTE — Telephone Encounter (Signed)
During medication review during TCM call, patient's daughter Shauna Hugh stated she was unaware patient was prescribed Sodium Chloride tablets. Advised her that patient's lab values for sodium were low during hospitalization.   Daughter would like PCP to review lab values and discharge summary to determine necessity of sodium supplementation at this time.

## 2018-05-13 NOTE — Telephone Encounter (Signed)
Noted. I do recommend sodium chloride tablets if this was recommended at discharge. Her sodium levels have been consistently low. We will continue to monitor her sodium levels. Will send my chart message to patient's daughter.

## 2018-05-14 ENCOUNTER — Telehealth: Payer: Self-pay

## 2018-05-14 NOTE — Telephone Encounter (Signed)
Approved.  

## 2018-05-14 NOTE — Telephone Encounter (Signed)
Merry Proud nurse with [facility] at [facility] left v/m requesting verbal orders for [facility] nursing to apply Unna boot wraps to both legs once a wk.Please advise.

## 2018-05-15 ENCOUNTER — Ambulatory Visit (INDEPENDENT_AMBULATORY_CARE_PROVIDER_SITE_OTHER): Payer: Medicare HMO | Admitting: Primary Care

## 2018-05-15 ENCOUNTER — Encounter: Payer: Self-pay | Admitting: Primary Care

## 2018-05-15 VITALS — BP 112/68 | HR 88 | Temp 98.2°F | Wt 136.8 lb

## 2018-05-15 DIAGNOSIS — I5020 Unspecified systolic (congestive) heart failure: Secondary | ICD-10-CM | POA: Diagnosis not present

## 2018-05-15 DIAGNOSIS — L03119 Cellulitis of unspecified part of limb: Secondary | ICD-10-CM

## 2018-05-15 DIAGNOSIS — I890 Lymphedema, not elsewhere classified: Secondary | ICD-10-CM

## 2018-05-15 DIAGNOSIS — Z09 Encounter for follow-up examination after completed treatment for conditions other than malignant neoplasm: Secondary | ICD-10-CM

## 2018-05-15 DIAGNOSIS — E871 Hypo-osmolality and hyponatremia: Secondary | ICD-10-CM

## 2018-05-15 DIAGNOSIS — R600 Localized edema: Secondary | ICD-10-CM | POA: Diagnosis not present

## 2018-05-15 DIAGNOSIS — A0471 Enterocolitis due to Clostridium difficile, recurrent: Secondary | ICD-10-CM | POA: Diagnosis not present

## 2018-05-15 NOTE — Progress Notes (Signed)
Subjective:    Patient ID: Heather Larson, female    DOB: February 11, 1924, 85 y.o.   MRN: [id number]  HPI  Heather Larson is a 85 year old female who presents today for [facility] Follow up.  She originally presented to our office on May 08, 2018 with a chief complaint of chills, fatigue, urinary frequency, fever, cough.  During her exam she was noted to be audibly wheezing and fatigue.  She was also noted to have likely cellulitis across her lower abdomen and left upper extremity.  Work-up in our office including chest x-ray, flu test, and UA were unremarkable.  Given her presentation she was sent to the emergency department for further evaluation.  Of note labs returned later with white blood cell count at 31.6.  During her emergency department visit she underwent lab work which showed leukocytosis, elevated lactic acid.  She was treated with IV antibiotics and fluids, and admitted for sepsis and further treatment.  She was consulted by general surgery during her ED stay for potential risk of necrotizing fasciitis.  General surgery did not feel that she had necrotizing fasciitis.  During her hospital stay she continued with IV antibiotics and fluids.  She was ruled out for DVT.  There was note made about her continued hyponatremia.  She did not get much chance for hospital physical therapy.  Labs continue to improve as well as physical exam.  She was discharged home on 05/12/18 with a prescription for a five day course of Augmentin, and set up for home health PT and nursing.   Since her hospital stay she was visited by [place] yesterday who recommended una boot wraps. The plan is for her to be visited by nursing at least once weekly. She has lymphedema pumps at home that are still in the box as she has not had a chance to use them.  She had no diarrhea during her hosptial stay.  Her daughter was told to give her sodium chloride tablets, but no other instructions were presented.   Her daughter also has a question about the frequency of her probiotics.  Patient is feeling much better overall, still somewhat fatigued and weak.  Review of Systems  Constitutional: Negative for fever.  Respiratory: Negative for shortness of breath.   Cardiovascular: Negative for chest pain.  Skin: Positive for color change.       Past Medical History:  Diagnosis Date  . Anemia   . Atrial fibrillation, permanent    on Xarelto  . Bilateral lower extremity edema   . Bilateral lower leg cellulitis 11/11/2017  . Cancer [facility])    breast cancer at age 49 and skin  . Cellulitis    lower abdomen  . CHF (congestive heart failure) ([place])   . Diarrhea 01/11/2018  . Dysrhythmia    a-fib  . Hyperlipidemia   . Hypertension   . Hypertension   . Incontinence   . Low sodium levels   . Macular degeneration   . Malnutrition of moderate degree 11/23/2017     Social History   Socioeconomic History  . Marital status: Divorced    Spouse name: Not on file  . Number of children: Not on file  . Years of education: Not on file  . Highest education level: Not on file  Occupational History  . Occupation: retired  Scientific laboratory technician  . Financial resource strain: Not on file  . Food insecurity:    Worry: Not on file    Inability:  Not on file  . Transportation needs:    Medical: Not on file    Non-medical: Not on file  Tobacco Use  . Smoking status: Never Smoker  . Smokeless tobacco: Never Used  Substance and Sexual Activity  . Alcohol use: Not Currently    Alcohol/week: 2.0 standard drinks    Types: 2 Shots of liquor per week    Comment: 2 drinks daily  . Drug use: No  . Sexual activity: Not Currently  Lifestyle  . Physical activity:    Days per week: Not on file    Minutes per session: Not on file  . Stress: Not on file  Relationships  . Social connections:    Talks on phone: Not on file    Gets together: Not on file    Attends religious service: Not on file    Active member of club  or organization: Not on file    Attends meetings of clubs or organizations: Not on file    Relationship status: Not on file  . Intimate partner violence:    Fear of current or ex partner: Not on file    Emotionally abused: Not on file    Physically abused: Not on file    Forced sexual activity: Not on file  Other Topics Concern  . Not on file  Social History Narrative   Moved from [place].    Past Surgical History:  Procedure Laterality Date  . BREAST SURGERY     right mastectomy  . EYE SURGERY     x3  . 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 IR RADIOLOGIST EVAL & MGMT  09/17/2017  . MASTECTOMY     right  . SHOULDER OPEN ROTATOR CUFF REPAIR     left  . SKIN BIOPSY     skin cancer    No family history on file.  No Known Allergies  Current Outpatient Medications on File Prior to Visit  Medication Sig Dispense Refill  . acetaminophen (TYLENOL) 325 MG tablet Take 2 tablets (650 mg total) by mouth every 6 (six) hours as needed for mild pain (or Fever >/= 101).    Marland Kitchen acidophilus (RISAQUAD) CAPS capsule Take 2 capsules by mouth 3 (three) times daily. (Patient taking differently: Take 1 capsule by mouth daily. ) 50 capsule 0  . albuterol (PROVENTIL HFA;VENTOLIN HFA) 108 (90 Base) MCG/ACT inhaler Inhale 2 puffs into the lungs every 6 (six) hours as needed for wheezing or shortness of breath.    Marland Kitchen amoxicillin-clavulanate (AUGMENTIN) 875-125 MG tablet Take 1 tablet by mouth every 12 (twelve) hours. 10 tablet 0  . atorvastatin (LIPITOR) 10 MG tablet Take 1 tablet (10 mg total) by mouth daily. 90 tablet 3  . brimonidine (ALPHAGAN) 0.2 % ophthalmic solution Place 1 drop into both eyes 3 (three) times daily.  6  . calcium carbonate (OSCAL) 1500 (600 Ca) MG TABS tablet Take by mouth 2 (two) times daily with a meal.    . dorzolamide (TRUSOPT) 2 % ophthalmic  solution Place 1 drop into both eyes 3 (three) times daily.  6  . furosemide (LASIX) 20 MG tablet TAKE 1 TABLET(20 MG) BY MOUTH EVERY OTHER DAY (Patient taking differently: Take 20 mg by mouth daily. ) 45 tablet 2  . HYDROcodone-acetaminophen (NORCO/VICODIN) 5-325 MG tablet Take 1 tablet by mouth every 6 (six) hours as needed for moderate pain. [place]  tablet 0  . metoprolol succinate (TOPROL-XL) 100 MG 24 hr tablet Take 1 tablet (100 mg total) by mouth daily. Take with or immediately following a meal. 90 tablet 3  . Multiple Vitamins-Minerals (MULTIVITAMIN ADULT) TABS Take 1 tablet by mouth daily.    . mupirocin ointment (BACTROBAN) 2 % Place 1 application into the nose 2 (two) times daily for 3 days. 22 g 0  . Nutritional Supplements (NUTRITIONAL SHAKE PLUS PROTEIN PO) Take by mouth.    . pantoprazole (PROTONIX) 40 MG tablet Take 1 tablet (40 mg total) by mouth daily. 30 tablet 2  . Rivaroxaban (XARELTO) 15 MG TABS tablet Take 1 tablet (15 mg total) by mouth daily. 90 tablet 3  . sodium chloride 1 g tablet Take 1 tablet (1 g total) by mouth 3 (three) times daily with meals for 10 doses. 10 tablet 0  . TRAVATAN Z 0.004 % SOLN ophthalmic solution Place 1 drop into both eyes at bedtime.   5  . vitamin C (ASCORBIC ACID) 500 MG tablet Take 500 mg by mouth daily.     No current facility-administered medications on file prior to visit.     BP 112/68 (BP Location: Right Arm, Patient Position: Sitting, Cuff Size: Normal)   Pulse 88   Temp 98.2 F (36.8 C) (Oral)   Wt 136 lb 12 oz (62 kg)   SpO2 97%   BMI 25.01 kg/m    Objective:   Physical Exam  Constitutional: She is oriented to person, place, and time. She appears well-nourished.  Cardiovascular: Normal rate. An irregularly irregular rhythm present.  Chronic bilateral lower edema noted.  Respiratory: Effort normal and breath sounds normal. She has no wheezes. She has no rales.  Neurological: She is alert and oriented to person, place, and time.   Skin: Skin is warm and dry. There is erythema.  Moderate erythema to bilateral lower extremities, left lateral lower extremity (upper portion).  No erythema to abdomen.           Assessment & Plan:

## 2018-05-15 NOTE — Assessment & Plan Note (Signed)
Recent admission for sepsis due to lower extremity cellulitis. Doing well now and is established with home health physical therapy. Proceed with Unna boot wraps and lymphedema pump. Discussed importance of elevation of legs when resting. Exam today overall stable. Hospital labs, imaging, notes reviewed.

## 2018-05-15 NOTE — Assessment & Plan Note (Signed)
Denies diarrhea or any abdominal symptoms. We will send message to GI regarding probiotic use.

## 2018-05-15 NOTE — Patient Instructions (Signed)
Continue the antibiotics as prescribed.   Please notify me if you need anything/support regarding physical therapy and the una boot wraps.   Use the lymphedema pumps when necessary.   I will be in touch regarding the sodium chloride tablets and probiotics.  It was a pleasure to see you today!

## 2018-05-15 NOTE — Telephone Encounter (Signed)
Verbal order left on Jeff's VM

## 2018-05-15 NOTE — Assessment & Plan Note (Signed)
Likely a combination of lymphedema and congestive heart failure. She will soon undergo Unna boot wraps and also wearing lymphedema pumps. Continue furosemide 20 mg daily as prescribed.

## 2018-05-15 NOTE — Assessment & Plan Note (Signed)
Chronic. Will likely need sodium chloride tablets, however, will consult with cardiology before initiation.

## 2018-05-16 DIAGNOSIS — H401132 Primary open-angle glaucoma, bilateral, moderate stage: Secondary | ICD-10-CM | POA: Diagnosis not present

## 2018-05-17 ENCOUNTER — Telehealth: Payer: Self-pay | Admitting: Gastroenterology

## 2018-05-17 NOTE — Telephone Encounter (Signed)
Pt left vm to speak to Dr. Verlin Grills nurse she states she left a  Message before she wants to know if it is normal to develop diarhea after the implant she had  Done

## 2018-05-17 NOTE — Telephone Encounter (Signed)
Pt left vm for Dr. Verlin Grills nurse regarding some questions on the implant she had done

## 2018-05-17 NOTE — Telephone Encounter (Signed)
Spoke with pt regarding her diarrhea. Pt stated she has been having diarrhea for about 3 to 4 days now. She is having about 2 to 3 bowel movements a day. They will start out runny but will get more solid as she continues to have them. Pt is not having any fevers, chills, abdominal pain or any abnormal odor. I have spoke with Dr. Vicente Males and he has advised for her to watch this through the weekend. If her symptoms worsen, she is to contact our GI on call physician. Pt has been given the [facility] phone number to call if she needs to.

## 2018-05-21 ENCOUNTER — Telehealth: Payer: Self-pay | Admitting: Licensed Clinical Social Worker

## 2018-05-21 ENCOUNTER — Telehealth: Payer: Self-pay | Admitting: *Deleted

## 2018-05-21 NOTE — Telephone Encounter (Signed)
CSW attempted to call patient this afternoon several times and her number rang busy. CSW will do another attempt tomorrow. Shela Leff MSW,LCSW [phone]

## 2018-05-21 NOTE — Telephone Encounter (Signed)
Approved.  

## 2018-05-21 NOTE — Telephone Encounter (Signed)
Marlowe Kays, OT with [facility] at [facility] is requesting verbal orders for pt to receive OT 1x/wk for 2wks. pls advise

## 2018-05-22 ENCOUNTER — Telehealth: Payer: Self-pay | Admitting: Gastroenterology

## 2018-05-22 ENCOUNTER — Other Ambulatory Visit: Payer: Self-pay

## 2018-05-22 DIAGNOSIS — R197 Diarrhea, unspecified: Secondary | ICD-10-CM

## 2018-05-22 MED ORDER — VANCOMYCIN HCL 125 MG PO CAPS: 125.0000 mg | ORAL_CAPSULE | Freq: Four times a day (QID) | ORAL | 0 refills | Status: DC

## 2018-05-22 NOTE — Telephone Encounter (Signed)
Daughter called (did not give name) L/M on our V/M stating the patient has an appointment on Friday but is having terrible diarrhea in the meantime. The need  advise or possible more antibiotics.

## 2018-05-22 NOTE — Telephone Encounter (Signed)
Spoke with pt daughter and prescription for Vancomycin has been sent to pharmacy and stool studies have been ordered per Dr. Marius Ditch, pt's daughter verbalized understanding

## 2018-05-22 NOTE — Telephone Encounter (Signed)
Message left for Heather Larson to return my call.

## 2018-05-23 ENCOUNTER — Other Ambulatory Visit: Payer: Self-pay

## 2018-05-23 DIAGNOSIS — R197 Diarrhea, unspecified: Secondary | ICD-10-CM | POA: Diagnosis not present

## 2018-05-24 ENCOUNTER — Telehealth: Payer: Self-pay | Admitting: Licensed Clinical Social Worker

## 2018-05-24 ENCOUNTER — Ambulatory Visit: Payer: Medicare HMO | Admitting: Gastroenterology

## 2018-05-24 ENCOUNTER — Other Ambulatory Visit
Admission: RE | Admit: 2018-05-24 | Discharge: 2018-05-24 | Disposition: A | Discharge status: Home / Self Care | Payer: Medicare HMO | Source: Ambulatory Visit | Attending: Gastroenterology | Admitting: Gastroenterology

## 2018-05-24 ENCOUNTER — Other Ambulatory Visit: Payer: Self-pay

## 2018-05-24 ENCOUNTER — Encounter: Payer: Self-pay | Admitting: Gastroenterology

## 2018-05-24 VITALS — BP 136/70 | HR 80 | Ht 62.0 in | Wt 134.2 lb

## 2018-05-24 DIAGNOSIS — A0471 Enterocolitis due to Clostridium difficile, recurrent: Secondary | ICD-10-CM

## 2018-05-24 LAB — CBC WITH DIFFERENTIAL/PLATELET
Abs Immature Granulocytes: 0.1 10*3/uL — ABNORMAL HIGH (ref 0.00–0.07)
Basophils Absolute: 0.1 10*3/uL (ref 0.0–0.1)
Basophils Relative: 1 %
Eosinophils Absolute: 0.1 10*3/uL (ref 0.0–0.5)
Eosinophils Relative: 0 %
HEMATOCRIT: 35.2 % — AB (ref 36.0–46.0)
Hemoglobin: 11.1 g/dL — ABNORMAL LOW (ref 12.0–15.0)
IMMATURE GRANULOCYTES: 1 %
LYMPHS ABS: 2.6 10*3/uL (ref 0.7–4.0)
LYMPHS PCT: 13 %
MCH: 25.8 pg — ABNORMAL LOW (ref 26.0–34.0)
MCHC: 31.5 g/dL (ref 30.0–36.0)
MCV: 81.9 fL (ref 80.0–100.0)
Monocytes Absolute: 1.8 10*3/uL — ABNORMAL HIGH (ref 0.1–1.0)
Monocytes Relative: 10 %
Neutro Abs: 14.7 10*3/uL — ABNORMAL HIGH (ref 1.7–7.7)
Neutrophils Relative %: 75 %
Platelets: 567 10*3/uL — ABNORMAL HIGH (ref 150–400)
RBC: 4.3 MIL/uL (ref 3.87–5.11)
RDW: 16.3 % — ABNORMAL HIGH (ref 11.5–15.5)
WBC: 19.3 10*3/uL — ABNORMAL HIGH (ref 4.0–10.5)
nRBC: 0 % (ref 0.0–0.2)

## 2018-05-24 LAB — COMPREHENSIVE METABOLIC PANEL
ALT: 15 U/L (ref 0–44)
AST: 19 U/L (ref 15–41)
Albumin: 3.3 g/dL — ABNORMAL LOW (ref 3.5–5.0)
Alkaline Phosphatase: 93 U/L (ref 38–126)
Anion gap: 9 (ref 5–15)
BUN: 12 mg/dL (ref 8–23)
CHLORIDE: 95 mmol/L — AB (ref 98–111)
CO2: 25 mmol/L (ref 22–32)
Calcium: 8.8 mg/dL — ABNORMAL LOW (ref 8.9–10.3)
Creatinine, Ser: 0.65 mg/dL (ref 0.44–1.00)
GFR calc Af Amer: >60 mL/min (ref >60)
GFR calc non Af Amer: >60 mL/min (ref >60)
Glucose, Bld: 96 mg/dL (ref 70–99)
Potassium: 3.8 mmol/L (ref 3.5–5.1)
Sodium: 129 mmol/L — ABNORMAL LOW (ref 135–145)
Total Bilirubin: 0.7 mg/dL (ref 0.3–1.2)
Total Protein: 7.1 g/dL (ref 6.5–8.1)

## 2018-05-24 LAB — MAGNESIUM: Magnesium: 2.1 mg/dL (ref 1.7–2.4)

## 2018-05-24 MED ORDER — DICYCLOMINE HCL 10 MG PO CAPS: 10.0000 mg | ORAL_CAPSULE | Freq: Three times a day (TID) | ORAL | 0 refills | Status: DC

## 2018-05-24 NOTE — Telephone Encounter (Signed)
CSW was able to reach patient this evening on a second attempt to reach her. CSW was responding to an EMMI call response. Patient informed CSW that she had already had "these calls before." CSW explained that this in person contact via phone was due to a response she gave regarding having feelings of sadness, loss of interest in things. Patient stated that she is doing very well and has a good support system. She states she is not having any concerns at this time. Shela Leff MSW,LCSW [phone]

## 2018-05-24 NOTE — Telephone Encounter (Signed)
Gave the approval for the verbal orders 

## 2018-05-24 NOTE — Progress Notes (Signed)
Please had a first-time involving his right  Cephas Darby, MD [address]  [place]  [place], [place] [address]  Main: [phone]  Fax: [phone]    Gastroenterology Consultation.  Referring Provider:     Pleas Koch, NP Primary Care Physician:  Pleas Koch, NP Primary Gastroenterologist:  Dr. Cephas Darby Reason for Consultation:    Recurrent Clostridium difficile colitis         HPI:   Heather Larson is a 85 y.o. female s seen as a hospital follow-up. Patient was recently discharged from [facility] after being treated for bilateral lower extremity cellulitis, treated with antibiotics and later developed C. Difficile colitis. She was treated with oral vancomycin and IV metronidazole. Discharged home on 2 weeks course of oral vancomycin.   Interval summary: Patient is accompanied by her daughter today who is very involved in her care. Patient's diarrhea has resolved. Currently having one to 2 formed bowel movements daily and sometimes experiences gurgling in her stomach.overall, her energy levels are significantly better, able to socialize with her friends and more active. She started taking iron every other day as recommended by her primary care provider because of microcytic anemia. Patient's hemoglobin was 11.9 in 03/2017 with normal MCV. And her hemoglobin has declined in last 1 month when she was admitted to [facility] with cellulitis and C. Difficile. Her hemoglobin nadir was 8.2, MCV 77 and reactive thrombocytosis. Patient denies having black tarry stools or rectal bleeding. She did have some bloody diarrhea and she has C. Difficile colitis. Her hemoglobin after discharge is 9.3 on 12/05/2017.  Follow-up visit in 01/29/2018 She was readmitted to [facility] about 2 weeks ago secondary to recurrence of C. Difficile.  Her WBC count at that time was 43,000, she was started on oral vancomycin 125 MCG 4 times daily.  During  the hospital stay, her WBC count improved to 13,000.  She was discharged home on oral vancomycin, her stools are more formed at the time of discharge.  Her diarrhea has resolved at this time.  She continues to take oral vancomycin 4 times daily which is her week 3.  She reports abdominal bloating.  She is taking acidophilus along with antibiotics.  She is also taking oral iron once a day.  She reports that she feels well otherwise  Follow-up visit 02/21/2018 Patient denies any complaints of diarrhea.  She has abdominal bloating, sometimes her stools are soft, on average 2 times daily.  Nonbloody, appetite is intact.  She is taking oral iron daily.  Currently, on vancomycin taper 1 pill 2-3 times every week.  Patient is accompanied by her daughter today.  Recent blood work shows that her hemoglobin is improving  Follow-up visit 04/02/18 Patient had second recurrence of C. Difficile, confirmed on 03/15/2018.  She had elevated leukocytosis, 20.3K.  She was started on oral vancomycin 125mg  4 times daily for 2 weeks.  Patient finished the course on Friday last week. Leukocytosis resolved on repeat labs on 03/26/2018 at cardiologist's office.  Currently, she reports having 1-2 semi-formed, nonbloody bowel movements daily.  She does have abdominal bloating.  She is taking probiotics as well as oral iron 1 pill daily.  Her hemoglobin has been stable.  Patient is accompanied by her daughter today.  Follow-up visit 05/24/2018 Since last visit, patient underwent FMT by Dr. Bonna Gains on 05/03/2018 when he was on vacation.  Procedure went well.  Unfortunately, patient developed cellulitis in her bilateral lower  extremities and lower abdomen requiring hospital admission from 05/08/2018 to 05/12/2018.  She received IV antibiotics and was discharged home on 5 days of amoxicillin.  Her cellulitis has completely resolved.  But for the last 5 days, patient developed recurrence of nonbloody diarrhea causing fecal incontinence  associated with lower abdominal cramps, temperature 100, decreased appetite.  She is overwhelmed with his ongoing symptoms as it has disrupted her quality of life.  She is not able to spend time with her friends at assisted living facility.  She is restricted to her apartment which she does not like it.  Cellulitis has almost resolved, finished antibiotic course, swelling of legs has improved  NSAIDs: none  Antiplts/Anticoagulants/Anti thrombotics: none  GI Procedures:  Colonoscopy 05/03/2018 - Diverticulosis in the sigmoid colon. 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 - No specimens collected.  Past Medical History:  Diagnosis Date  . Anemia   . Atrial fibrillation, permanent    on Xarelto  . Bilateral lower extremity edema   . Bilateral lower leg cellulitis 11/11/2017  . Cancer [facility])    breast cancer at age 68 and skin  . Cellulitis    lower abdomen  . CHF (congestive heart failure) ([place])   . Diarrhea 01/11/2018  . Dysrhythmia    a-fib  . Hyperlipidemia   . Hypertension   . Hypertension   . Incontinence   . Low sodium levels   . Macular degeneration   . Malnutrition of moderate degree 11/23/2017    Past Surgical History:  Procedure Laterality Date  . BREAST SURGERY     right mastectomy  . EYE SURGERY     x3  . 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 IR RADIOLOGIST EVAL & MGMT  09/17/2017  . MASTECTOMY     right  . SHOULDER OPEN ROTATOR CUFF REPAIR     left  . SKIN BIOPSY     skin cancer    Current Outpatient Medications:  .  acetaminophen (TYLENOL) 325 MG tablet, Take 2 tablets (650 mg total) by mouth every 6 (six) hours as needed for mild pain (or Fever >/= 101)., Disp: , Rfl:  .  acidophilus (RISAQUAD) CAPS capsule, Take 2 capsules by mouth 3 (three) times daily. (Patient taking differently: Take 1  capsule by mouth daily. ), Disp: 50 capsule, Rfl: 0 .  albuterol (PROVENTIL HFA;VENTOLIN HFA) 108 (90 Base) MCG/ACT inhaler, Inhale 2 puffs into the lungs every 6 (six) hours as needed for wheezing or shortness of breath., Disp: , Rfl:  .  atorvastatin (LIPITOR) 10 MG tablet, Take 1 tablet (10 mg total) by mouth daily., Disp: 90 tablet, Rfl: 3 .  brimonidine (ALPHAGAN) 0.2 % ophthalmic solution, Place 1 drop into both eyes 3 (three) times daily., Disp: , Rfl: 6 .  calcium carbonate (OSCAL) 1500 (600 Ca) MG TABS tablet, Take by mouth 2 (two) times daily with a meal., Disp: , Rfl:  .  dicyclomine (BENTYL) 10 MG capsule, Take 1 capsule (10 mg total) by mouth 4 (four) times daily -  before meals and at bedtime., Disp: 90 capsule, Rfl: 0 .  dorzolamide (TRUSOPT) 2 % ophthalmic solution, Place 1 drop into both eyes 3 (three) times daily., Disp: , Rfl: 6 .  furosemide (LASIX) 20 MG tablet, TAKE 1 TABLET(20 MG) BY MOUTH EVERY OTHER DAY (Patient taking differently: Take 20 mg by mouth daily. ),  Disp: 45 tablet, Rfl: 2 .  HYDROcodone-acetaminophen (NORCO/VICODIN) 5-325 MG tablet, Take 1 tablet by mouth every 6 (six) hours as needed for moderate pain., Disp: 20 tablet, Rfl: 0 .  metoprolol succinate (TOPROL-XL) 100 MG 24 hr tablet, Take 1 tablet (100 mg total) by mouth daily. Take with or immediately following a meal., Disp: 90 tablet, Rfl: 3 .  Multiple Vitamins-Minerals (MULTIVITAMIN ADULT) TABS, Take 1 tablet by mouth daily., Disp: , Rfl:  .  Nutritional Supplements (NUTRITIONAL SHAKE PLUS PROTEIN PO), Take by mouth., Disp: , Rfl:  .  pantoprazole (PROTONIX) 40 MG tablet, Take 1 tablet (40 mg total) by mouth daily., Disp: 30 tablet, Rfl: 2 .  Rivaroxaban (XARELTO) 15 MG TABS tablet, Take 1 tablet (15 mg total) by mouth daily., Disp: 90 tablet, Rfl: 3 .  TRAVATAN Z 0.004 % SOLN ophthalmic solution, Place 1 drop into both eyes at bedtime. , Disp: , Rfl: 5 .  vancomycin (VANCOCIN) 125 MG capsule, Take 1 capsule  (125 mg total) by mouth 4 (four) times daily., Disp: 30 capsule, Rfl: 0 .  vitamin C (ASCORBIC ACID) 500 MG tablet, Take 500 mg by mouth daily., Disp: , Rfl:   No family history on file.   Social History   Tobacco Use  . Smoking status: Never Smoker  . Smokeless tobacco: Never Used  Substance Use Topics  . Alcohol use: Not Currently    Alcohol/week: 2.0 standard drinks    Types: 2 Shots of liquor per week    Comment: 2 drinks daily  . Drug use: No    Allergies as of 05/24/2018  . (No Known Allergies)    Review of Systems:    All systems reviewed and negative except where noted in HPI.   Physical Exam:  BP 136/70   Pulse 80   Ht 5\' 2"  (1.575 m)   Wt 134 lb 3.2 oz (60.9 kg)   BMI 24.55 kg/m  No LMP recorded. Patient is postmenopausal.  General:   Alert, well-built, appropriate for her age, pleasant and cooperative in NAD Head:  Normocephalic and atraumatic. Eyes:  Sclera clear, no icterus.   Conjunctiva pink. Ears:  Normal auditory acuity. Nose:  No deformity, discharge, or lesions. Mouth:  No deformity or lesions,oropharynx pink & moist. Neck:  Supple; no masses or thyromegaly. Lungs:  Respirations even and unlabored.  Clear throughout to auscultation.   No wheezes, crackles, or rhonchi. No acute distress. Heart:  Regular rate and rhythm; no murmurs, clicks, rubs, or gallops. Abdomen:  Normal bowel sounds. Soft, non-tender and distended, tympanic without masses, hepatosplenomegaly or hernias noted.  No guarding or rebound tenderness.   Rectal: Not performed Msk:  Symmetrical without gross deformities. Good, equal movement & strength bilaterally. Pulses:  Normal pulses noted. Extremities:  No clubbing or edema.  No cyanosis. Neurologic:  Alert and oriented x3;  grossly normal neurologically. Skin:  Intact without significant lesions or rashes. No jaundice, 3+ nonpitting edema likely secondary to venous stasis. Lymph Nodes:  No significant cervical adenopathy. Psych:   Alert and cooperative. Normal mood and affect.  Imaging Studies: No abdominal imaging  Assessment and Plan:   Heather Larson is a 85 y.o. Caucasian female with history of A. Fib on rivaroxaban, hypertension, admission to [facility] in 10/2017 with lower extremity cellulitis and subsequently developed C. Difficile in the hospital status post treatment with oral vancomycin and metronidazole for 2 weeks. Diarrhea has resolved.  She had first recurrence of C. difficile in 12/2017,  status post treatment with oral vancomycin 125 mg, extended taper.  Patient had second recurrence of C. difficile on 03/15/2018, status post treatment with 2 weeks course of oral vancomycin.  Currently, diarrhea has resolved.  She developed 3rd recurrence of C. Difficile, started on fidaxomicin, underwent FMT on 05/03/2018.  Recurrence of cellulitis after FMT requiring IV and oral antibiotics.  Now with recurrence of diarrhea for 5 days concern for C. difficile recurrence  Recurrent diarrhea: Stool studies in process Oral vancomycin started again, trying to get Dificid as it is expensive for co-pay Suggested to try Bentyl at bedtime for cramps and diarrhea Check CBC with differential, CMP, magnesium Plan for second FMT Advised patient to go to emergency room if her diarrhea is worsening   Follow up in 1 week for FMT   Cephas Darby, MD

## 2018-05-26 LAB — GI PROFILE, STOOL, PCR
Adenovirus F 40/41: NOT DETECTED
Astrovirus: NOT DETECTED
C difficile toxin A/B: DETECTED — AB
Campylobacter: NOT DETECTED
Cryptosporidium: NOT DETECTED
Cyclospora cayetanensis: NOT DETECTED
ENTEROTOXIGENIC E COLI: NOT DETECTED
Entamoeba histolytica: NOT DETECTED
Enteroaggregative E coli: NOT DETECTED
Enteropathogenic E coli: NOT DETECTED
Giardia lamblia: NOT DETECTED
NOROVIRUS GI/GII: NOT DETECTED
Plesiomonas shigelloides: NOT DETECTED
ROTAVIRUS A: NOT DETECTED
Salmonella: NOT DETECTED
Sapovirus: NOT DETECTED
Shiga-toxin-producing E coli: NOT DETECTED
Shigella/Enteroinvasive E coli: NOT DETECTED
Vibrio cholerae: NOT DETECTED
Vibrio: NOT DETECTED
Yersinia enterocolitica: NOT DETECTED

## 2018-05-28 ENCOUNTER — Telehealth: Payer: Self-pay | Admitting: Gastroenterology

## 2018-05-28 NOTE — Telephone Encounter (Signed)
Spoke with pt's daughter and she will start Difficid medication tomorrow 05/29/2018

## 2018-05-28 NOTE — Telephone Encounter (Signed)
Pt daughter left vm she states the rx came by mail and she would like to know when pt can start the rx

## 2018-05-29 ENCOUNTER — Telehealth: Payer: Self-pay | Admitting: Gastroenterology

## 2018-05-29 NOTE — Telephone Encounter (Signed)
Spoke with pt daughter Shauna Hugh and per Dr. Marius Ditch stop all antibiotics and prepare for FMT on 05/31/2018, pt's daughter verbalized understanding

## 2018-05-29 NOTE — Telephone Encounter (Signed)
Heather Larson came by & states the patient took one  Vancomycin this morning @ 3am. She wants to know if she should give her Difficid twice a day & should she give her it the day of the procedure.

## 2018-05-31 ENCOUNTER — Telehealth: Payer: Self-pay | Admitting: *Deleted

## 2018-05-31 ENCOUNTER — Ambulatory Visit
Admission: RE | Admit: 2018-05-31 | Discharge: 2018-05-31 | Disposition: A | Discharge status: Home / Self Care | Payer: Medicare HMO | Attending: Gastroenterology | Admitting: Gastroenterology

## 2018-05-31 ENCOUNTER — Encounter
Admission: RE | Disposition: A | Discharge status: Home / Self Care | Payer: Self-pay | Source: Home / Self Care | Attending: Gastroenterology

## 2018-05-31 ENCOUNTER — Encounter: Payer: Self-pay | Admitting: Anesthesiology

## 2018-05-31 ENCOUNTER — Ambulatory Visit: Payer: Medicare HMO | Admitting: Anesthesiology

## 2018-05-31 DIAGNOSIS — I4821 Permanent atrial fibrillation: Secondary | ICD-10-CM | POA: Insufficient documentation

## 2018-05-31 DIAGNOSIS — A0471 Enterocolitis due to Clostridium difficile, recurrent: Secondary | ICD-10-CM

## 2018-05-31 DIAGNOSIS — E785 Hyperlipidemia, unspecified: Secondary | ICD-10-CM | POA: Diagnosis not present

## 2018-05-31 DIAGNOSIS — E440 Moderate protein-calorie malnutrition: Secondary | ICD-10-CM | POA: Insufficient documentation

## 2018-05-31 DIAGNOSIS — I119 Hypertensive heart disease without heart failure: Secondary | ICD-10-CM | POA: Diagnosis not present

## 2018-05-31 DIAGNOSIS — H3530 Unspecified macular degeneration: Secondary | ICD-10-CM | POA: Insufficient documentation

## 2018-05-31 DIAGNOSIS — Z853 Personal history of malignant neoplasm of breast: Secondary | ICD-10-CM | POA: Insufficient documentation

## 2018-05-31 DIAGNOSIS — Z79899 Other long term (current) drug therapy: Secondary | ICD-10-CM | POA: Insufficient documentation

## 2018-05-31 DIAGNOSIS — D649 Anemia, unspecified: Secondary | ICD-10-CM | POA: Diagnosis not present

## 2018-05-31 DIAGNOSIS — R32 Unspecified urinary incontinence: Secondary | ICD-10-CM | POA: Diagnosis not present

## 2018-05-31 DIAGNOSIS — Z6821 Body mass index (BMI) 21.0-21.9, adult: Secondary | ICD-10-CM | POA: Insufficient documentation

## 2018-05-31 DIAGNOSIS — I110 Hypertensive heart disease with heart failure: Secondary | ICD-10-CM | POA: Diagnosis not present

## 2018-05-31 DIAGNOSIS — Z7901 Long term (current) use of anticoagulants: Secondary | ICD-10-CM | POA: Insufficient documentation

## 2018-05-31 DIAGNOSIS — I5033 Acute on chronic diastolic (congestive) heart failure: Secondary | ICD-10-CM | POA: Diagnosis not present

## 2018-05-31 HISTORY — PX: FECAL TRANSPLANT: SHX6383

## 2018-05-31 HISTORY — PX: COLONOSCOPY WITH PROPOFOL: SHX5780

## 2018-05-31 SURGERY — COLONOSCOPY WITH PROPOFOL
Anesthesia: General

## 2018-05-31 MED ORDER — PROPOFOL 10 MG/ML IV BOLUS
INTRAVENOUS | Status: AC
  Filled 2018-05-31: qty 20

## 2018-05-31 MED ORDER — LOPERAMIDE HCL 2 MG PO CAPS
4.0000 mg | ORAL_CAPSULE | Freq: Once | ORAL | Status: AC
  Administered 2018-05-31: 4 mg via ORAL
  Filled 2018-05-31: qty 2

## 2018-05-31 MED ORDER — PROPOFOL 10 MG/ML IV BOLUS
INTRAVENOUS | Status: DC | PRN
  Administered 2018-05-31: 30 mg via INTRAVENOUS

## 2018-05-31 MED ORDER — PROPOFOL 500 MG/50ML IV EMUL
INTRAVENOUS | Status: DC | PRN
  Administered 2018-05-31: 100 ug/kg/min via INTRAVENOUS

## 2018-05-31 MED ORDER — SODIUM CHLORIDE 0.9 % IV SOLN
INTRAVENOUS | Status: DC
  Administered 2018-05-31: 1000 mL via INTRAVENOUS

## 2018-05-31 NOTE — Anesthesia Postprocedure Evaluation (Signed)
Anesthesia Post Note  Patient: Heather Larson  Procedure(s) Performed: COLONOSCOPY WITH PROPOFOL (N/A ) FECAL TRANSPLANT (N/A )  Patient location during evaluation: Endoscopy Anesthesia Type: General Level of consciousness: awake and alert Pain management: pain level controlled Vital Signs Assessment: post-procedure vital signs reviewed and stable Respiratory status: spontaneous breathing, nonlabored ventilation, respiratory function stable and patient connected to nasal cannula oxygen Cardiovascular status: blood pressure returned to baseline and stable Postop Assessment: no apparent nausea or vomiting Anesthetic complications: no     Last Vitals:  Vitals:   05/31/18 1420 05/31/18 1430  BP: (!) 138/56 (!) 145/75  Pulse: 67 83  Resp: 20 20  Temp:    SpO2: 95% 94%    Last Pain:  Vitals:   05/31/18 1430  TempSrc:   PainSc: 0-No pain                 Ralynn San S

## 2018-05-31 NOTE — Op Note (Signed)
[facility] Gastroenterology Patient Name: Heather Larson Procedure Date: 05/31/2018 1:25 PM MRN: [id number] Account #: [id number] Date of Birth: 12-09-1923 Admit Type: Outpatient Age: 85 Room: [facility] ENDO ROOM 4 Gender: Female Note Status: Finalized Procedure:            Colonoscopy Indications:          Fecal transplant for treatment of recurrent Clostridium                        difficile diarrhea Providers:            Lin Landsman MD, MD Referring MD:         Pleas Koch (Referring MD) Medicines:            Monitored Anesthesia Care Complications:        No immediate complications. Estimated blood loss: None. Procedure:            Pre-Anesthesia Assessment:                       - Prior to the procedure, a History and Physical was                        performed, and patient medications and allergies were                        reviewed. The patient is competent. The risks and                        benefits of the procedure and the sedation options and                        risks were discussed with the patient. All questions                        were answered and informed consent was obtained.                        Patient identification and proposed procedure were                        verified by the physician, the nurse, the                        anesthesiologist, the anesthetist and the technician in                        the pre-procedure area in the procedure room in the                        endoscopy suite. Mental Status Examination: alert and                        oriented. Airway Examination: normal oropharyngeal                        airway and neck mobility. Respiratory Examination:                        clear to auscultation. CV Examination: normal.  Prophylactic Antibiotics: The patient does not require                        prophylactic antibiotics. Prior Anticoagulants: The   patient has taken no previous anticoagulant or                        antiplatelet agents. ASA Grade Assessment: III - A                        patient with severe systemic disease. After reviewing                        the risks and benefits, the patient was deemed in                        satisfactory condition to undergo the procedure. The                        anesthesia plan was to use monitored anesthesia care                        (MAC). Immediately prior to administration of                        medications, the patient was re-assessed for adequacy                        to receive sedatives. The heart rate, respiratory rate,                        oxygen saturations, blood pressure, adequacy of                        pulmonary ventilation, and response to care were                        monitored throughout the procedure. The physical status                        of the patient was re-assessed after the procedure.                       After obtaining informed consent, the colonoscope was                        passed under direct vision. Throughout the procedure,                        the patient's blood pressure, pulse, and oxygen                        saturations were monitored continuously. The                        Colonoscope was introduced through the anus and                        advanced to the 20 cm into the ileum. The colonoscopy  was performed without difficulty. The patient tolerated                        the procedure well. The quality of the bowel                        preparation was excellent. Findings:      The perianal and digital rectal examinations were normal.      Scope advanced to 30cm from IC valve. The ileum appeared normal. Fecal       Microbiota Transplant (Bacteriotherapy): Donor stool was prepared by a       third party (purchased) as per protocol. Approximately 250 mL of the       emulsified donor stool was  instilled in the mid ileum. A detailed       colonoscopic exam could not be performed upon scope withdrawal secondary       to limited visibility from the instilled stool.      The colon (entire examined portion) appeared normal. Impression:           - The examined portion of the ileum was normal.                       - The entire examined colon is normal.                       - Fecal Microbiota Transplant (Bacteriotherapy)                        performed in the mid ileum.                       - No specimens collected. Recommendation:       - Discharge patient to home (with escort).                       - Resume previous diet today.                       - Continue present medications. Procedure Code(s):    --- Professional ---                       [phone], Colonoscopy, flexible; diagnostic, including                        collection of specimen(s) by brushing or washing, when                        performed (separate procedure)                       [id number], Preparation of fecal microbiota for                        instillation, including assessment of donor specimen Diagnosis Code(s):    --- Professional ---                       A04.71, Enterocolitis due to Clostridium difficile,                        recurrent CPT copyright 2018 [employer]. All rights  reserved. The codes documented in this report are preliminary and upon coder review may  be revised to meet current compliance requirements. Dr. Ulyess Mort Lin Landsman MD, MD 05/31/2018 2:05:17 PM This report has been signed electronically. Number of Addenda: 0 Note Initiated On: 05/31/2018 1:25 PM Scope Withdrawal Time: 0 hours 15 minutes 37 seconds  Total Procedure Duration: 0 hours 23 minutes 0 seconds       [facility]

## 2018-05-31 NOTE — Telephone Encounter (Signed)
Noted  

## 2018-05-31 NOTE — Telephone Encounter (Signed)
Cindy left a voicemail wanting to let Allie Bossier NP, know that they will be resuming PT next week if patient is home from the hospital.

## 2018-05-31 NOTE — Anesthesia Post-op Follow-up Note (Signed)
Anesthesia QCDR form completed.        

## 2018-05-31 NOTE — Anesthesia Preprocedure Evaluation (Signed)
Anesthesia Evaluation  Patient identified by MRN, date of birth, ID band Patient awake    Reviewed: Allergy & Precautions, NPO status , Patient's Chart, lab work & pertinent test results, reviewed documented beta blocker date and time   Airway Mallampati: III  TM Distance: >3 FB     Dental  (+) Chipped   Pulmonary           Cardiovascular hypertension, Pt. on medications and Pt. on home beta blockers +CHF  + dysrhythmias Atrial Fibrillation      Neuro/Psych    GI/Hepatic   Endo/Other    Renal/GU      Musculoskeletal   Abdominal   Peds  Hematology  (+) anemia ,   Anesthesia Other Findings On xarelto. EF 60-65.  Reproductive/Obstetrics                             Anesthesia Physical Anesthesia Plan  ASA: III  Anesthesia Plan: General   Post-op Pain Management:    Induction: Intravenous  PONV Risk Score and Plan:   Airway Management Planned: Oral ETT  Additional Equipment:   Intra-op Plan:   Post-operative Plan:   Informed Consent: I have reviewed the patients History and Physical, chart, labs and discussed the procedure including the risks, benefits and alternatives for the proposed anesthesia with the patient or authorized representative who has indicated his/her understanding and acceptance.       Plan Discussed with: CRNA  Anesthesia Plan Comments:         Anesthesia Quick Evaluation

## 2018-05-31 NOTE — H&P (Signed)
Cephas Darby, MD [address]  [place], [place] [address]  Main: [phone]  Fax: [phone] Pager: [phone]  Primary Care Physician:  Pleas Koch, NP Primary Gastroenterologist:  Dr. Cephas Darby  Pre-Procedure History & Physical: HPI:  Heather Larson is a 85 y.o. female is here for an colonoscopy.   Past Medical History:  Diagnosis Date  . Anemia   . Atrial fibrillation, permanent    on Xarelto  . Bilateral lower extremity edema   . Bilateral lower leg cellulitis 11/11/2017  . Cancer [facility])    breast cancer at age 55 and skin  . Cellulitis    lower abdomen  . CHF (congestive heart failure) ([place])   . Diarrhea 01/11/2018  . Dysrhythmia    a-fib  . Hyperlipidemia   . Hypertension   . Hypertension   . Incontinence   . Low sodium levels   . Macular degeneration   . Malnutrition of moderate degree 11/23/2017    Past Surgical History:  Procedure Laterality Date  . BREAST SURGERY     right mastectomy  . EYE SURGERY     x3  . 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 IR RADIOLOGIST EVAL & MGMT  09/17/2017  . MASTECTOMY     right  . SHOULDER OPEN ROTATOR CUFF REPAIR     left  . SKIN BIOPSY     skin cancer    Prior to Admission medications   Medication Sig Start Date End Date Taking? Authorizing Provider  acetaminophen (TYLENOL) 325 MG tablet Take 2 tablets (650 mg total) by mouth every 6 (six) hours as needed for mild pain (or Fever >/= 101). 05/12/18  Yes Gouru, Illene Silver, MD  acidophilus (RISAQUAD) CAPS capsule Take 2 capsules by mouth 3 (three) times daily. Patient taking differently: Take 1 capsule by mouth daily.  01/14/18  Yes [name], MD  albuterol (PROVENTIL HFA;VENTOLIN HFA) 108 (90 Base) MCG/ACT inhaler Inhale 2 puffs into the lungs every 6 (six) hours as needed for wheezing or shortness  of breath.   Yes [provider]  atorvastatin (LIPITOR) 10 MG tablet Take 1 tablet (10 mg total) by mouth daily. 10/02/17  Yes Gollan, Kathlene November, MD  brimonidine (ALPHAGAN) 0.2 % ophthalmic solution Place 1 drop into both eyes 3 (three) times daily. 01/02/18  Yes [provider]  calcium carbonate (OSCAL) 1500 (600 Ca) MG TABS tablet Take by mouth 2 (two) times daily with a meal.   Yes [provider]  dicyclomine (BENTYL) 10 MG capsule Take 1 capsule (10 mg total) by mouth 4 (four) times daily -  before meals and at bedtime. 05/24/18  Yes Ameir Faria, Tally Due, MD  dorzolamide (TRUSOPT) 2 % ophthalmic solution Place 1 drop into both eyes 3 (three) times daily. 01/02/18  Yes [provider]  furosemide (LASIX) 20 MG tablet TAKE 1 TABLET(20 MG) BY MOUTH EVERY OTHER DAY Patient taking differently: Take 20 mg by mouth daily.  04/29/18  Yes Dunn, Areta Haber, PA-C  HYDROcodone-acetaminophen (NORCO/VICODIN) 5-325 MG tablet Take 1 tablet by mouth every 6 (six) hours as needed for moderate pain. 05/12/18  Yes Gouru, Illene Silver, MD  metoprolol succinate (TOPROL-XL) 100 MG 24 hr tablet Take 1 tablet (100 mg total) by mouth daily. Take with or immediately following a meal. 03/26/18 06/24/18 Yes Dunn, Areta Haber, PA-C  Multiple Vitamins-Minerals (MULTIVITAMIN  ADULT) TABS Take 1 tablet by mouth daily.   Yes [provider]  Nutritional Supplements (NUTRITIONAL SHAKE PLUS PROTEIN PO) Take by mouth.   Yes [provider]  pantoprazole (PROTONIX) 40 MG tablet Take 1 tablet (40 mg total) by mouth daily. 03/09/18  Yes Gladstone Lighter, MD  Rivaroxaban (XARELTO) 15 MG TABS tablet Take 1 tablet (15 mg total) by mouth daily. 10/02/17  Yes Gollan, Kathlene November, MD  TRAVATAN Z 0.004 % SOLN ophthalmic solution Place 1 drop into both eyes at bedtime.  01/03/18  Yes [provider]  vitamin C (ASCORBIC ACID) 500 MG tablet Take 500 mg by mouth daily.   Yes [provider]  vancomycin  (VANCOCIN) 125 MG capsule Take 1 capsule (125 mg total) by mouth 4 (four) times daily. Patient not taking: Reported on 05/31/2018 05/22/18   Lin Landsman, MD    Allergies as of 05/27/2018  . (No Known Allergies)    History reviewed. No pertinent family history.  Social History   Socioeconomic History  . Marital status: Divorced    Spouse name: Not on file  . Number of children: Not on file  . Years of education: Not on file  . Highest education level: Not on file  Occupational History  . Occupation: retired  Scientific laboratory technician  . Financial resource strain: Not on file  . Food insecurity:    Worry: Not on file    Inability: Not on file  . Transportation needs:    Medical: Not on file    Non-medical: Not on file  Tobacco Use  . Smoking status: Never Smoker  . Smokeless tobacco: Never Used  Substance and Sexual Activity  . Alcohol use: Not Currently    Alcohol/week: 2.0 standard drinks    Types: 2 Shots of liquor per week    Comment: 2 drinks daily  . Drug use: No  . Sexual activity: Not Currently  Lifestyle  . Physical activity:    Days per week: Not on file    Minutes per session: Not on file  . Stress: Not on file  Relationships  . Social connections:    Talks on phone: Not on file    Gets together: Not on file    Attends religious service: Not on file    Active member of club or organization: Not on file    Attends meetings of clubs or organizations: Not on file    Relationship status: Not on file  . Intimate partner violence:    Fear of current or ex partner: Not on file    Emotionally abused: Not on file    Physically abused: Not on file    Forced sexual activity: Not on file  Other Topics Concern  . Not on file  Social History Narrative   Moved from [place].    Review of Systems: See HPI, otherwise negative ROS  Physical Exam: BP (!) 175/105   Pulse (!) 111   Temp (!) 97.5 F (36.4 C) (Tympanic)   Resp 19   Ht 5\' 2"  (1.575 m)   Wt 54.4 kg    SpO2 98%   BMI 21.95 kg/m  General:   Alert,  pleasant and cooperative in NAD Head:  Normocephalic and atraumatic. Neck:  Supple; no masses or thyromegaly. Lungs:  Clear throughout to auscultation.    Heart:  Regular rate and rhythm. Abdomen:  Soft, nontender and nondistended. Normal bowel sounds, without guarding, and without rebound.   Neurologic:  Alert and  oriented x4;  grossly normal neurologically.  Impression/Plan: Kemaya Dorner is here for an colonoscopy to be performed for fecal transplant  Risks, benefits, limitations, and alternatives regarding  colonoscopy have been reviewed with the patient.  Questions have been answered.  All parties agreeable.   Sherri Sear, MD  05/31/2018, 12:58 PM

## 2018-05-31 NOTE — Transfer of Care (Signed)
Immediate Anesthesia Transfer of Care Note  Patient: Heather Larson  Procedure(s) Performed: COLONOSCOPY WITH PROPOFOL (N/A ) FECAL TRANSPLANT (N/A )  Patient Location: Endoscopy Unit  Anesthesia Type:General  Level of Consciousness: awake  Airway & Oxygen Therapy: Patient Spontanous Breathing and Patient connected to nasal cannula oxygen  Post-op Assessment: Report given to RN and Post -op Vital signs reviewed and stable  Post vital signs: Reviewed and stable  Last Vitals:  Vitals Value Taken Time  BP    Temp    Pulse    Resp    SpO2      Last Pain:  Vitals:   05/31/18 1254  TempSrc: Tympanic  PainSc: 0-No pain         Complications: No apparent anesthesia complications

## 2018-06-03 ENCOUNTER — Encounter: Payer: Self-pay | Admitting: Gastroenterology

## 2018-06-03 ENCOUNTER — Telehealth: Payer: Self-pay

## 2018-06-03 NOTE — Telephone Encounter (Signed)
Noted  

## 2018-06-03 NOTE — Telephone Encounter (Signed)
FYI: D/C after colonosopy 05-31-18 [facility] and they will resume her Nursing and PT tomorrow.

## 2018-06-04 ENCOUNTER — Encounter: Payer: Self-pay | Admitting: Gastroenterology

## 2018-06-04 DIAGNOSIS — I4821 Permanent atrial fibrillation: Secondary | ICD-10-CM | POA: Diagnosis not present

## 2018-06-04 DIAGNOSIS — L03116 Cellulitis of left lower limb: Secondary | ICD-10-CM | POA: Diagnosis not present

## 2018-06-04 DIAGNOSIS — L03311 Cellulitis of abdominal wall: Secondary | ICD-10-CM | POA: Diagnosis not present

## 2018-06-04 DIAGNOSIS — E871 Hypo-osmolality and hyponatremia: Secondary | ICD-10-CM | POA: Diagnosis not present

## 2018-06-04 DIAGNOSIS — E785 Hyperlipidemia, unspecified: Secondary | ICD-10-CM | POA: Diagnosis not present

## 2018-06-04 DIAGNOSIS — D649 Anemia, unspecified: Secondary | ICD-10-CM | POA: Diagnosis not present

## 2018-06-04 DIAGNOSIS — I110 Hypertensive heart disease with heart failure: Secondary | ICD-10-CM | POA: Diagnosis not present

## 2018-06-04 DIAGNOSIS — H3530 Unspecified macular degeneration: Secondary | ICD-10-CM | POA: Diagnosis not present

## 2018-06-04 DIAGNOSIS — A0471 Enterocolitis due to Clostridium difficile, recurrent: Secondary | ICD-10-CM | POA: Diagnosis not present

## 2018-06-04 DIAGNOSIS — A408 Other streptococcal sepsis: Secondary | ICD-10-CM | POA: Diagnosis not present

## 2018-06-04 DIAGNOSIS — E440 Moderate protein-calorie malnutrition: Secondary | ICD-10-CM | POA: Diagnosis not present

## 2018-06-04 DIAGNOSIS — I5032 Chronic diastolic (congestive) heart failure: Secondary | ICD-10-CM | POA: Diagnosis not present

## 2018-06-04 LAB — SPECIMEN STATUS REPORT

## 2018-06-06 ENCOUNTER — Other Ambulatory Visit: Payer: Self-pay

## 2018-06-06 LAB — CLOSTRIDIUM DIFFICILE BY PCR: Toxigenic C. Difficile by PCR: POSITIVE — AB

## 2018-06-06 MED ORDER — ATORVASTATIN CALCIUM 10 MG PO TABS: 10.0000 mg | ORAL_TABLET | Freq: Every day | ORAL | 3 refills | Status: DC

## 2018-06-26 ENCOUNTER — Ambulatory Visit (INDEPENDENT_AMBULATORY_CARE_PROVIDER_SITE_OTHER): Payer: Medicare HMO | Admitting: Primary Care

## 2018-06-26 ENCOUNTER — Telehealth: Payer: Self-pay | Admitting: Radiology

## 2018-06-26 ENCOUNTER — Inpatient Hospital Stay
Admission: EM | Admit: 2018-06-26 | Discharge: 2018-07-01 | DRG: 603 | Disposition: A | Discharge status: Home / Self Care | Payer: Medicare HMO | Attending: Family Medicine | Admitting: Family Medicine

## 2018-06-26 ENCOUNTER — Emergency Department: Payer: Medicare HMO

## 2018-06-26 ENCOUNTER — Encounter: Payer: Self-pay | Admitting: Primary Care

## 2018-06-26 ENCOUNTER — Encounter: Payer: Self-pay | Admitting: Gastroenterology

## 2018-06-26 ENCOUNTER — Other Ambulatory Visit: Payer: Self-pay

## 2018-06-26 VITALS — BP 132/86 | HR 112 | Temp 99.0°F | Ht 62.0 in | Wt 130.8 lb

## 2018-06-26 DIAGNOSIS — Z66 Do not resuscitate: Secondary | ICD-10-CM | POA: Diagnosis present

## 2018-06-26 DIAGNOSIS — A498 Other bacterial infections of unspecified site: Secondary | ICD-10-CM | POA: Diagnosis not present

## 2018-06-26 DIAGNOSIS — I872 Venous insufficiency (chronic) (peripheral): Secondary | ICD-10-CM | POA: Diagnosis present

## 2018-06-26 DIAGNOSIS — E860 Dehydration: Secondary | ICD-10-CM | POA: Diagnosis present

## 2018-06-26 DIAGNOSIS — L03119 Cellulitis of unspecified part of limb: Secondary | ICD-10-CM

## 2018-06-26 DIAGNOSIS — Z9011 Acquired absence of right breast and nipple: Secondary | ICD-10-CM

## 2018-06-26 DIAGNOSIS — E222 Syndrome of inappropriate secretion of antidiuretic hormone: Secondary | ICD-10-CM | POA: Diagnosis present

## 2018-06-26 DIAGNOSIS — L02416 Cutaneous abscess of left lower limb: Secondary | ICD-10-CM

## 2018-06-26 DIAGNOSIS — I110 Hypertensive heart disease with heart failure: Secondary | ICD-10-CM | POA: Diagnosis present

## 2018-06-26 DIAGNOSIS — L03115 Cellulitis of right lower limb: Secondary | ICD-10-CM | POA: Diagnosis present

## 2018-06-26 DIAGNOSIS — D649 Anemia, unspecified: Secondary | ICD-10-CM | POA: Diagnosis present

## 2018-06-26 DIAGNOSIS — R32 Unspecified urinary incontinence: Secondary | ICD-10-CM | POA: Diagnosis present

## 2018-06-26 DIAGNOSIS — Z9119 Patient's noncompliance with other medical treatment and regimen: Secondary | ICD-10-CM

## 2018-06-26 DIAGNOSIS — L03116 Cellulitis of left lower limb: Principal | ICD-10-CM | POA: Diagnosis present

## 2018-06-26 DIAGNOSIS — Z79899 Other long term (current) drug therapy: Secondary | ICD-10-CM | POA: Diagnosis not present

## 2018-06-26 DIAGNOSIS — A0471 Enterocolitis due to Clostridium difficile, recurrent: Secondary | ICD-10-CM

## 2018-06-26 DIAGNOSIS — I4821 Permanent atrial fibrillation: Secondary | ICD-10-CM | POA: Diagnosis present

## 2018-06-26 DIAGNOSIS — H3530 Unspecified macular degeneration: Secondary | ICD-10-CM | POA: Diagnosis present

## 2018-06-26 DIAGNOSIS — Z8619 Personal history of other infectious and parasitic diseases: Secondary | ICD-10-CM | POA: Diagnosis not present

## 2018-06-26 DIAGNOSIS — R600 Localized edema: Secondary | ICD-10-CM | POA: Diagnosis present

## 2018-06-26 DIAGNOSIS — M7989 Other specified soft tissue disorders: Secondary | ICD-10-CM | POA: Diagnosis present

## 2018-06-26 DIAGNOSIS — I509 Heart failure, unspecified: Secondary | ICD-10-CM | POA: Diagnosis present

## 2018-06-26 DIAGNOSIS — L0390 Cellulitis, unspecified: Secondary | ICD-10-CM | POA: Diagnosis not present

## 2018-06-26 DIAGNOSIS — Z872 Personal history of diseases of the skin and subcutaneous tissue: Secondary | ICD-10-CM

## 2018-06-26 DIAGNOSIS — Z7901 Long term (current) use of anticoagulants: Secondary | ICD-10-CM

## 2018-06-26 DIAGNOSIS — I4891 Unspecified atrial fibrillation: Secondary | ICD-10-CM | POA: Diagnosis not present

## 2018-06-26 DIAGNOSIS — E871 Hypo-osmolality and hyponatremia: Secondary | ICD-10-CM | POA: Diagnosis not present

## 2018-06-26 DIAGNOSIS — I10 Essential (primary) hypertension: Secondary | ICD-10-CM | POA: Diagnosis not present

## 2018-06-26 DIAGNOSIS — E785 Hyperlipidemia, unspecified: Secondary | ICD-10-CM | POA: Diagnosis present

## 2018-06-26 DIAGNOSIS — Z853 Personal history of malignant neoplasm of breast: Secondary | ICD-10-CM

## 2018-06-26 DIAGNOSIS — Z8614 Personal history of Methicillin resistant Staphylococcus aureus infection: Secondary | ICD-10-CM

## 2018-06-26 DIAGNOSIS — Z8719 Personal history of other diseases of the digestive system: Secondary | ICD-10-CM

## 2018-06-26 DIAGNOSIS — Z85828 Personal history of other malignant neoplasm of skin: Secondary | ICD-10-CM

## 2018-06-26 DIAGNOSIS — B951 Streptococcus, group B, as the cause of diseases classified elsewhere: Secondary | ICD-10-CM | POA: Diagnosis present

## 2018-06-26 DIAGNOSIS — Z79891 Long term (current) use of opiate analgesic: Secondary | ICD-10-CM

## 2018-06-26 DIAGNOSIS — Z8249 Family history of ischemic heart disease and other diseases of the circulatory system: Secondary | ICD-10-CM

## 2018-06-26 DIAGNOSIS — D72829 Elevated white blood cell count, unspecified: Secondary | ICD-10-CM | POA: Diagnosis not present

## 2018-06-26 LAB — CBC WITH DIFFERENTIAL/PLATELET
Abs Immature Granulocytes: 1.44 10*3/uL — ABNORMAL HIGH (ref 0.00–0.07)
Basophils Absolute: 0.5 10*3/uL — ABNORMAL HIGH (ref 0.0–0.1)
Basophils Absolute: 0.1 10*3/uL (ref 0.0–0.1)
Basophils Relative: 1.2 % (ref 0.0–3.0)
Basophils Relative: 0 %
Eosinophils Absolute: 0 10*3/uL (ref 0.0–0.7)
Eosinophils Absolute: 0 10*3/uL (ref 0.0–0.5)
Eosinophils Relative: 0 % (ref 0.0–5.0)
Eosinophils Relative: 0 %
HCT: 35.9 % — ABNORMAL LOW (ref 36.0–46.0)
HCT: 34.9 % — ABNORMAL LOW (ref 36.0–46.0)
Hemoglobin: 11.6 g/dL — ABNORMAL LOW (ref 12.0–15.0)
Hemoglobin: 11.3 g/dL — ABNORMAL LOW (ref 12.0–15.0)
Immature Granulocytes: 3 %
LYMPHS ABS: 1.3 10*3/uL (ref 0.7–4.0)
Lymphocytes Relative: 2.2 % — ABNORMAL LOW (ref 12.0–46.0)
Lymphocytes Relative: 3 %
Lymphs Abs: 1.1 10*3/uL (ref 0.7–4.0)
MCH: 26.3 pg (ref 26.0–34.0)
MCHC: 32.4 g/dL (ref 30.0–36.0)
MCHC: 32.4 g/dL (ref 30.0–36.0)
MCV: 80.4 fl (ref 78.0–100.0)
MCV: 81.4 fL (ref 80.0–100.0)
Monocytes Absolute: 1.7 10*3/uL — ABNORMAL HIGH (ref 0.1–1.0)
Monocytes Absolute: 1.5 10*3/uL — ABNORMAL HIGH (ref 0.1–1.0)
Monocytes Relative: 3.5 % (ref 3.0–12.0)
Monocytes Relative: 3 %
Neutro Abs: 44.3 10*3/uL — ABNORMAL HIGH (ref 1.4–7.7)
Neutro Abs: 43.6 10*3/uL — ABNORMAL HIGH (ref 1.7–7.7)
Neutrophils Relative %: 93.1 % — ABNORMAL HIGH (ref 43.0–77.0)
Neutrophils Relative %: 91 %
Platelets: 331 10*3/uL (ref 150.0–400.0)
Platelets: 299 10*3/uL (ref 150–400)
RBC: 4.46 Mil/uL (ref 3.87–5.11)
RBC: 4.29 MIL/uL (ref 3.87–5.11)
RDW: 17.7 % — ABNORMAL HIGH (ref 11.5–15.5)
RDW: 17.6 % — ABNORMAL HIGH (ref 11.5–15.5)
WBC: 47.5 10*3/uL (ref 4.0–10.5)
WBC: 48 10*3/uL — ABNORMAL HIGH (ref 4.0–10.5)
nRBC: 0 % (ref 0.0–0.2)

## 2018-06-26 LAB — COMPREHENSIVE METABOLIC PANEL
ALT: 19 U/L (ref 0–44)
AST: 32 U/L (ref 15–41)
Albumin: 3.7 g/dL (ref 3.5–5.0)
Alkaline Phosphatase: 71 U/L (ref 38–126)
Anion gap: 12 (ref 5–15)
BUN: 16 mg/dL (ref 8–23)
CO2: 21 mmol/L — AB (ref 22–32)
Calcium: 8.9 mg/dL (ref 8.9–10.3)
Chloride: 94 mmol/L — ABNORMAL LOW (ref 98–111)
Creatinine, Ser: 0.75 mg/dL (ref 0.44–1.00)
GFR calc Af Amer: >60 mL/min (ref >60)
GFR calc non Af Amer: >60 mL/min (ref >60)
Glucose, Bld: 140 mg/dL — ABNORMAL HIGH (ref 70–99)
Potassium: 3.9 mmol/L (ref 3.5–5.1)
SODIUM: 127 mmol/L — AB (ref 135–145)
Total Bilirubin: 1.1 mg/dL (ref 0.3–1.2)
Total Protein: 7.4 g/dL (ref 6.5–8.1)

## 2018-06-26 LAB — LACTIC ACID, PLASMA
LACTIC ACID, VENOUS: 1.7 mmol/L (ref 0.5–1.9)
LACTIC ACID, VENOUS: 1.3 mmol/L (ref 0.5–1.9)
Lactic Acid, Venous: 2 mmol/L (ref 0.5–1.9)

## 2018-06-26 MED ORDER — CEPHALEXIN 500 MG PO CAPS: 500.0000 mg | ORAL_CAPSULE | Freq: Three times a day (TID) | ORAL | 0 refills | Status: DC

## 2018-06-26 MED ORDER — FIDAXOMICIN 200 MG PO TABS
200.0000 mg | ORAL_TABLET | Freq: Two times a day (BID) | ORAL | Status: DC
  Administered 2018-06-27 – 2018-07-01 (×11): 200 mg via ORAL
  Filled 2018-06-26 (×14): qty 1

## 2018-06-26 MED ORDER — SODIUM CHLORIDE 0.9 % IV SOLN
1.0000 g | Freq: Once | INTRAVENOUS | Status: AC
  Administered 2018-06-26: 1 g via INTRAVENOUS
  Filled 2018-06-26: qty 1

## 2018-06-26 MED ORDER — NYSTATIN 100000 UNIT/GM EX CREA
TOPICAL_CREAM | Freq: Two times a day (BID) | CUTANEOUS | Status: DC
  Administered 2018-06-27 (×3): via TOPICAL
  Administered 2018-06-28: 1 via TOPICAL
  Administered 2018-06-28 – 2018-07-01 (×5): via TOPICAL
  Filled 2018-06-26: qty 15

## 2018-06-26 MED ORDER — VANCOMYCIN HCL IN DEXTROSE 1-5 GM/200ML-% IV SOLN
1000.0000 mg | Freq: Once | INTRAVENOUS | Status: AC
  Administered 2018-06-26: 1000 mg via INTRAVENOUS
  Filled 2018-06-26: qty 200

## 2018-06-26 MED ORDER — VANCOMYCIN HCL IN DEXTROSE 750-5 MG/150ML-% IV SOLN
750.0000 mg | INTRAVENOUS | Status: DC
  Administered 2018-06-27 – 2018-06-30 (×4): 750 mg via INTRAVENOUS
  Filled 2018-06-26 (×7): qty 150

## 2018-06-26 NOTE — Progress Notes (Signed)
Family Meeting Note  Advance Directive:yes  Today a meeting took place with the Patient and daughter.  The following clinical team members were present during this meeting:MD  The following were discussed:Patient's diagnosis: recurrent cellulitis, C diff, Htn, Hyperlipidemia , Patient's progosis: Unable to determine and Goals for treatment: DNR  Additional follow-up to be provided: ID  Time spent during discussion:20 minutes  [name], MD

## 2018-06-26 NOTE — Assessment & Plan Note (Signed)
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 No recurrent diarrhea. Seems to be doing well.

## 2018-06-26 NOTE — ED Provider Notes (Addendum)
[facility] Emergency Department Provider Note   ____________________________________________   First MD Initiated Contact with Patient 06/26/18 1441     (approximate)  I have reviewed the triage vital signs and the nursing notes.   HISTORY  Chief Complaint Cellulitis and Abnormal Lab    HPI Heather Larson is a 85 y.o. female has had multiple episodes of C. difficile and 2 fecal transplants he comes in with a 40,000 white count from her primary care doctor's office cellulitis involving the whole left leg up to the buttocks.  She had a temperature of almost 101 at home  Past Medical History:  Diagnosis Date  . Anemia   . Atrial fibrillation, permanent    on Xarelto  . Bilateral lower extremity edema   . Bilateral lower leg cellulitis 11/11/2017  . Cancer [facility])    breast cancer at age 89 and skin  . Cellulitis    lower abdomen  . CHF (congestive heart failure) ([place])   . Diarrhea 01/11/2018  . Dysrhythmia    a-fib  . Hyperlipidemia   . Hypertension   . Hypertension   . Incontinence   . Low sodium levels   . Macular degeneration   . Malnutrition of moderate degree 11/23/2017    Patient Active Problem List   Diagnosis Date Noted  . Recurrent Clostridium difficile diarrhea   . Sepsis ([place]) 05/08/2018  . Chronic diastolic heart failure ([place]) 03/13/2018  . Anemia 03/13/2018  . Lymphedema 03/13/2018  . Acute on chronic diastolic heart failure ([place])   . Recurrent colitis due to Clostridioides difficile   . Hyponatremia 08/06/2017  . Bilateral lower extremity edema 07/31/2017  . Macular degeneration 06/15/2017  . Chronic atrial fibrillation 06/15/2017  . Essential hypertension 06/15/2017  . Urinary incontinence 06/15/2017  . Osteoporosis 06/15/2017  . Hyperlipemia 06/15/2017  . Glaucoma of both eyes 10/08/2014  . Recurrent cellulitis of lower extremity 09/29/2014  . Personal history of other malignant neoplasm of skin 05/13/2014  . Closed  head injury 10/28/2013  . Hand laceration 10/28/2013    Past Surgical History:  Procedure Laterality Date  . BREAST SURGERY     right mastectomy  . COLONOSCOPY WITH PROPOFOL N/A 05/31/2018   Procedure: COLONOSCOPY WITH PROPOFOL;  Surgeon: Lin Landsman, MD;  Location: [facility] ENDOSCOPY;  Service: Gastroenterology;  Laterality: N/A;  . EYE SURGERY     x3  . 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 FECAL TRANSPLANT N/A 05/31/2018   Procedure: FECAL TRANSPLANT;  Surgeon: Lin Landsman, MD;  Location: [facility] ENDOSCOPY;  Service: Gastroenterology;  Laterality: N/A;  . IR RADIOLOGIST EVAL & MGMT  09/17/2017  . MASTECTOMY     right  . SHOULDER OPEN ROTATOR CUFF REPAIR     left  . SKIN BIOPSY     skin cancer    Prior to Admission medications   Medication Sig Start Date End Date Taking? Authorizing Provider  acetaminophen (TYLENOL) 325 MG tablet Take 2 tablets (650 mg total) by mouth every 6 (six) hours as needed for mild pain (or Fever >/= 101). 05/12/18   Gouru, Illene Silver, MD  albuterol (PROVENTIL HFA;VENTOLIN HFA) 108 (90 Base) MCG/ACT inhaler Inhale 2 puffs into the lungs every 6 (six) hours as needed for wheezing or shortness of breath.    [provider]  atorvastatin (LIPITOR) 10 MG tablet Take 1 tablet (10 mg total)  by mouth daily. 06/06/18   Minna Merritts, MD  brimonidine (ALPHAGAN) 0.2 % ophthalmic solution Place 1 drop into both eyes 3 (three) times daily. 01/02/18   [provider]  calcium carbonate (OSCAL) 1500 (600 Ca) MG TABS tablet Take by mouth 2 (two) times daily with a meal.    [provider]  cephALEXin (KEFLEX) 500 MG capsule Take 1 capsule (500 mg total) by mouth 3 (three) times daily for 7 days. 06/26/18 07/03/18  Pleas Koch, NP  dicyclomine (BENTYL) 10 MG capsule Take 1 capsule (10 mg total) by mouth 4  (four) times daily -  before meals and at bedtime. 05/24/18   Lin Landsman, MD  dorzolamide (TRUSOPT) 2 % ophthalmic solution Place 1 drop into both eyes 3 (three) times daily. 01/02/18   [provider]  ferrous sulfate 325 (65 FE) MG EC tablet Take 325 mg by mouth daily with breakfast.    [provider]  fidaxomicin (DIFICID) 200 MG TABS tablet Take 200 mg by mouth 2 (two) times daily.    [provider]  furosemide (LASIX) 20 MG tablet TAKE 1 TABLET(20 MG) BY MOUTH EVERY OTHER DAY Patient taking differently: Take 20 mg by mouth daily.  04/29/18   Rise Mu, PA-C  HYDROcodone-acetaminophen (NORCO/VICODIN) 5-325 MG tablet Take 1 tablet by mouth every 6 (six) hours as needed for moderate pain. 05/12/18   Nicholes Mango, MD  metoprolol succinate (TOPROL-XL) 100 MG 24 hr tablet Take 1 tablet (100 mg total) by mouth daily. Take with or immediately following a meal. 03/26/18 06/24/18  Dunn, Areta Haber, PA-C  Multiple Vitamins-Minerals (MULTIVITAMIN ADULT) TABS Take 1 tablet by mouth daily.    [provider]  Nutritional Supplements (NUTRITIONAL SHAKE PLUS PROTEIN PO) Take by mouth.    [provider]  Rivaroxaban (XARELTO) 15 MG TABS tablet Take 1 tablet (15 mg total) by mouth daily. 10/02/17   Gollan, Kathlene November, MD  TRAVATAN Z 0.004 % SOLN ophthalmic solution Place 1 drop into both eyes at bedtime.  01/03/18   [provider]  vitamin C (ASCORBIC ACID) 500 MG tablet Take 500 mg by mouth daily.    [provider]    Allergies Patient has no known allergies.  No family history on file.  Social History Social History   Tobacco Use  . Smoking status: Never Smoker  . Smokeless tobacco: Never Used  Substance Use Topics  . Alcohol use: Not Currently    Alcohol/week: 2.0 standard drinks    Types: 2 Shots of liquor per week    Comment: 2 drinks daily  . Drug use: No    Review of Systems  Constitutional: No fever/chills Eyes: No  visual changes. ENT: No sore throat. Cardiovascular: Denies chest pain. Respiratory: Denies shortness of breath. Gastrointestinal: No abdominal pain.  No nausea, no vomiting.  No diarrhea.  No constipation. Genitourinary: Negative for dysuria. Musculoskeletal: Negative for back pain. Skin: Negative for rash. Neurological: Negative for headaches, focal weakness   ____________________________________________   PHYSICAL EXAM:  VITAL SIGNS: ED Triage Vitals [06/26/18 1410]  Enc Vitals Group     BP (!) 128/59     Pulse Rate 89     Resp 18     Temp 99 F (37.2 C)     Temp Source Oral     SpO2 95 %     Weight 130 lb 1.1 oz (59 kg)     Height 5\' 2"  (1.575 m)  Head Circumference      Peak Flow      Pain Score 3     Pain Loc      Pain Edu?      Excl. in [place]?     Constitutional: Alert and oriented. Well appearing and in no acute distress. Eyes: Conjunctivae are normal.  Head: Atraumatic. Nose: No congestion/rhinnorhea. Mouth/Throat: Mucous membranes are moist.  Oropharynx non-erythematous. Neck: No stridor. Cardiovascular: Normal rate, regular rhythm. Grossly normal heart sounds.  Good peripheral circulation. Respiratory: Normal respiratory effort.  No retractions. Lungs CTAB. Gastrointestinal: Soft and nontender. No distention. No abdominal bruits. No CVA tenderness. Musculoskeletal: Both legs are edematous.  Left leg is warm and red and fairly tight. Neurologic:  Normal speech and language. No gross focal neurologic deficits are appreciated.  Skin:  Skin is warm, dry and intact. No rash noted. Psychiatric: Mood and affect are normal. Speech and behavior are normal.  ____________________________________________   LABS (all labs ordered are listed, but only abnormal results are displayed)  Labs Reviewed  COMPREHENSIVE METABOLIC PANEL - Abnormal; Notable for the following components:      Result Value   Sodium 127 (*)    Chloride 94 (*)    CO2 21 (*)    Glucose, Bld  140 (*)    All other components within normal limits  CBC WITH DIFFERENTIAL/PLATELET - Abnormal; Notable for the following components:   WBC 48.0 (*)    Hemoglobin 11.3 (*)    HCT 34.9 (*)    RDW 17.6 (*)    All other components within normal limits  CULTURE, BLOOD (ROUTINE X 2)  CULTURE, BLOOD (ROUTINE X 2)  LACTIC ACID, PLASMA  URINALYSIS, COMPLETE (UACMP) WITH MICROSCOPIC  LACTIC ACID, PLASMA  LACTIC ACID, PLASMA  URINALYSIS, ROUTINE W REFLEX MICROSCOPIC   ____________________________________________  EKG EKG read interpreted by me shows A. fib at a rate of 84 normal axis nonspecific ST-T wave changes ____________________________________________  RADIOLOGY  ED MD interpretation:    Official radiology report(s): No results found.  ____________________________________________   PROCEDURES  Procedure(s) performed (including Critical Care): Critical care time 25 minutes.  This includes discussing the patient with the gastroenterologist and the hospitalist and reviewing her old records.  Procedures   ____________________________________________   INITIAL IMPRESSION / ASSESSMENT AND PLAN / ED COURSE  Clinically the patient has cellulitis and sepsis.  I spoke with Dr. Marius Ditch the gastroenterologist who knows her very well.  She wants me to start Dificid as C. difficile prophylaxis.  I will also get her on some Maxipime for early sepsis for her 48,000 white count and 85 year old and we will get an ultrasound of her left leg to make sure she does not have any DVTs there so I think clinically it is cellulitis.          ____________________________________________   FINAL CLINICAL IMPRESSION(S) / ED DIAGNOSES  Final diagnoses:  Cellulitis and abscess of left lower extremity  Hyponatremia     ED Discharge Orders    None       Note:  This document was prepared using Dragon voice recognition software and may include unintentional dictation errors.      Nena Polio, MD 06/26/18 1511    Nena Polio, MD 06/26/18 1528    Nena Polio, MD 07/04/18 [phone]

## 2018-06-26 NOTE — H&P (Signed)
[facility] NAME: Heather Larson    MR#:  [id number]  DATE OF BIRTH:  22-Dec-1923  DATE OF ADMISSION:  06/26/2018  PRIMARY CARE PHYSICIAN: Pleas Koch, NP   REQUESTING/REFERRING PHYSICIAN: mALINDA  CHIEF COMPLAINT:   Chief Complaint  Patient presents with  . Cellulitis  . Abnormal Lab    HISTORY OF PRESENT ILLNESS: Heather Larson  is a 85 y.o. female with a known history of atrial fibrillation on Xarelto, bilateral lower extremity edema, breast cancer, recurrent lower extremity cellulitis, CHF, C. 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 They did the blood work including CBC and called them to take her to emergency room as her white blood cell count was 47,000.  Patient also had some low-grade fever at home today.   PAST MEDICAL HISTORY:   Past Medical History:  Diagnosis Date  . Anemia   . Atrial fibrillation, permanent    on Xarelto  . Bilateral lower extremity edema   . Bilateral lower leg cellulitis 11/11/2017  . Cancer [facility])    breast cancer at age 63 and skin  . Cellulitis    lower abdomen  . CHF (congestive heart failure) ([place])   . Diarrhea 01/11/2018  . Dysrhythmia    a-fib  . Hyperlipidemia   . Hypertension   . Hypertension   . Incontinence   . Low sodium levels   . Macular degeneration   . Malnutrition of moderate degree 11/23/2017    PAST SURGICAL HISTORY:  Past Surgical History:  Procedure Laterality Date  . BREAST SURGERY     right mastectomy  . COLONOSCOPY WITH PROPOFOL N/A 05/31/2018   Procedure: COLONOSCOPY WITH PROPOFOL;  Surgeon: Lin Landsman, MD;  Location: [facility] ENDOSCOPY;  Service: Gastroenterology;   Laterality: N/A;  . EYE SURGERY     x3  . 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 FECAL TRANSPLANT N/A 05/31/2018   Procedure: FECAL TRANSPLANT;  Surgeon: Lin Landsman, MD;  Location: [facility] ENDOSCOPY;  Service: Gastroenterology;  Laterality: N/A;  . IR RADIOLOGIST EVAL & MGMT  09/17/2017  . MASTECTOMY     right  . SHOULDER OPEN ROTATOR CUFF REPAIR     left  . SKIN BIOPSY     skin cancer    SOCIAL HISTORY:  Social History   Tobacco Use  . Smoking status: Never Smoker  . Smokeless tobacco: Never Used  Substance Use Topics  . Alcohol use: Not Currently    Alcohol/week: 2.0 standard drinks    Types: 2 Shots of liquor per week    Comment: 2 drinks daily    FAMILY HISTORY:  Family History  Problem Relation Age of Onset  . Hypertension Mother     DRUG ALLERGIES: No Known Allergies  REVIEW OF SYSTEMS:   CONSTITUTIONAL: No fever, fatigue or weakness.  EYES: No blurred or double vision.  EARS, NOSE, AND THROAT: No tinnitus or ear pain.  RESPIRATORY: No cough, shortness of breath, wheezing or hemoptysis.  CARDIOVASCULAR: No chest pain, orthopnea, edema.  GASTROINTESTINAL: No nausea, vomiting, diarrhea or abdominal pain.  GENITOURINARY: No dysuria, hematuria.  ENDOCRINE:  No polyuria, nocturia,  HEMATOLOGY: No anemia, easy bruising or bleeding SKIN: No rash or lesion. MUSCULOSKELETAL: No joint pain or arthritis.   NEUROLOGIC: No tingling, numbness, weakness.  PSYCHIATRY: No anxiety or depression.   MEDICATIONS AT HOME:  Prior to Admission medications   Medication Sig Start Date End Date Taking? Authorizing Provider  acetaminophen (TYLENOL) 325 MG tablet Take 2 tablets (650 mg total) by mouth every 6 (six) hours as needed for mild pain (or Fever >/= 101). 05/12/18   Gouru, Illene Silver, MD  albuterol (PROVENTIL HFA;VENTOLIN HFA) 108 (90 Base)  MCG/ACT inhaler Inhale 2 puffs into the lungs every 6 (six) hours as needed for wheezing or shortness of breath.    [provider]  atorvastatin (LIPITOR) 10 MG tablet Take 1 tablet (10 mg total) by mouth daily. 06/06/18   Minna Merritts, MD  brimonidine (ALPHAGAN) 0.2 % ophthalmic solution Place 1 drop into both eyes 3 (three) times daily. 01/02/18   [provider]  calcium carbonate (OSCAL) 1500 (600 Ca) MG TABS tablet Take by mouth 2 (two) times daily with a meal.    [provider]  cephALEXin (KEFLEX) 500 MG capsule Take 1 capsule (500 mg total) by mouth 3 (three) times daily for 7 days. 06/26/18 07/03/18  Pleas Koch, NP  dicyclomine (BENTYL) 10 MG capsule Take 1 capsule (10 mg total) by mouth 4 (four) times daily -  before meals and at bedtime. 05/24/18   Lin Landsman, MD  dorzolamide (TRUSOPT) 2 % ophthalmic solution Place 1 drop into both eyes 3 (three) times daily. 01/02/18   [provider]  ferrous sulfate 325 (65 FE) MG EC tablet Take 325 mg by mouth daily with breakfast.    [provider]  fidaxomicin (DIFICID) 200 MG TABS tablet Take 200 mg by mouth 2 (two) times daily.    [provider]  furosemide (LASIX) 20 MG tablet TAKE 1 TABLET(20 MG) BY MOUTH EVERY OTHER DAY Patient taking differently: Take 20 mg by mouth daily.  04/29/18   Rise Mu, PA-C  HYDROcodone-acetaminophen (NORCO/VICODIN) 5-325 MG tablet Take 1 tablet by mouth every 6 (six) hours as needed for moderate pain. 05/12/18   Nicholes Mango, MD  metoprolol succinate (TOPROL-XL) 100 MG 24 hr tablet Take 1 tablet (100 mg total) by mouth daily. Take with or immediately following a meal. 03/26/18 06/24/18  Dunn, Areta Haber, PA-C  Multiple Vitamins-Minerals (MULTIVITAMIN ADULT) TABS Take 1 tablet by mouth daily.    [provider]  Nutritional Supplements (NUTRITIONAL SHAKE PLUS PROTEIN PO) Take by mouth.    [provider]  Rivaroxaban (XARELTO) 15 MG  TABS tablet Take 1 tablet (15 mg total) by mouth daily. 10/02/17   Gollan, Kathlene November, MD  TRAVATAN Z 0.004 % SOLN ophthalmic solution Place 1 drop into both eyes at bedtime.  01/03/18   [provider]  vitamin C (ASCORBIC ACID) 500 MG tablet Take 500 mg by mouth daily.    [provider]      PHYSICAL EXAMINATION:   VITAL SIGNS: Blood pressure (!) 142/70, pulse 84, temperature 99 F (37.2 C), temperature source Oral, resp. rate 18, height 5\' 2"  (1.575 m), weight 59 kg, SpO2 95 %.  GENERAL:  85 y.o.-year-old patient lying in the bed with no acute distress.  EYES: Pupils equal, round, reactive to light and accommodation. No scleral icterus. Extraocular muscles intact.  HEENT: Head atraumatic, normocephalic. Oropharynx and nasopharynx clear.  Some small lesions around her lips.  NECK:  Supple, no jugular venous distention. No thyroid enlargement, no tenderness.  LUNGS: Normal breath sounds bilaterally, no wheezing, rales,rhonchi or crepitation. No use of accessory muscles of respiration.  CARDIOVASCULAR: S1, S2 normal. No murmurs, rubs, or gallops.  ABDOMEN: Soft, nontender, nondistended. Bowel sounds present. No organomegaly or mass.  EXTREMITIES: Bilateral pedal edema, significant redness on left lower extremity on leg and thigh extending to lower abdominal wall, no cyanosis, or clubbing.  NEUROLOGIC: Cranial nerves II through XII are intact. Muscle strength 4/5 in all extremities. Sensation intact. Gait not checked.  PSYCHIATRIC: The patient is alert and oriented x 3.  SKIN: No obvious rash, lesion, or ulcer.   LABORATORY PANEL:   CBC Recent Labs  Lab 06/26/18 1037 06/26/18 1414  WBC 47.5 Repeated and verified X2.* 48.0*  HGB 11.6* 11.3*  HCT 35.9* 34.9*  PLT 331.0 299  MCV 80.4 81.4  MCH  --  26.3  MCHC 32.4 32.4  RDW 17.7* 17.6*  LYMPHSABS 1.1 1.3  MONOABS 1.7* 1.5*  EOSABS 0.0 0.0  BASOSABS 0.5* 0.1    ------------------------------------------------------------------------------------------------------------------  Chemistries  Recent Labs  Lab 06/26/18 1414  NA 127*  K 3.9  CL 94*  CO2 21*  GLUCOSE 140*  BUN 16  CREATININE 0.75  CALCIUM 8.9  AST 32  ALT 19  ALKPHOS 71  BILITOT 1.1   ------------------------------------------------------------------------------------------------------------------ estimated creatinine clearance is 34.7 mL/min (by C-G formula based on SCr of 0.75 mg/dL). ------------------------------------------------------------------------------------------------------------------ No results for input(s): TSH, T4TOTAL, T3FREE, THYROIDAB in the last 72 hours.  Invalid input(s): FREET3   Coagulation profile No results for input(s): INR, PROTIME in the last 168 hours. ------------------------------------------------------------------------------------------------------------------- No results for input(s): DDIMER in the last 72 hours. -------------------------------------------------------------------------------------------------------------------  Cardiac Enzymes No results for input(s): CKMB, TROPONINI, MYOGLOBIN in the last 168 hours.  Invalid input(s): CK ------------------------------------------------------------------------------------------------------------------ Invalid input(s): POCBNP  ---------------------------------------------------------------------------------------------------------------  Urinalysis    Component Value Date/Time   COLORURINE YELLOW (A) 05/08/2018 1632   APPEARANCEUR CLEAR (A) 05/08/2018 1632   LABSPEC 1.028 05/08/2018 1632   PHURINE 5.0 05/08/2018 1632   GLUCOSEU NEGATIVE 05/08/2018 1632   HGBUR SMALL (A) 05/08/2018 1632   BILIRUBINUR NEGATIVE 05/08/2018 1632   BILIRUBINUR Negative 05/08/2018 1212   KETONESUR NEGATIVE 05/08/2018 1632   PROTEINUR 100 (A) 05/08/2018 1632   UROBILINOGEN 0.2 05/08/2018 1212    NITRITE NEGATIVE 05/08/2018 1632   LEUKOCYTESUR NEGATIVE 05/08/2018 1632     RADIOLOGY: No results found.  EKG: Orders placed or performed during the hospital encounter of 06/26/18  . ED EKG  . ED EKG 12-Lead  . ED EKG  . ED EKG 12-Lead  . EKG 12-Lead  . EKG 12-Lead    IMPRESSION AND PLAN:  *Recurrent cellulitis Already ordered to check for DVT Doppler study by ER physician. As she had MRSA in the past I will start on vancomycin for now. I have contacted infectious disease physician to get her opinion about chronic prophylactic antibiotic to prevent recurrent cellulitis.  *History of C. Difficile Patient had C. difficile recurrently with cellulitis and antibiotic use, received stool transplant in January 2020. Currently she does not have diarrhea. I have contacted Dr. Marius Ditch who is her GI doctor, just to get her opinion because of her complicated situation.  I have not placed official consult but Dr. Marius Ditch suggested to start on Dificid 200 mg twice daily while she is on antibiotics.  * A fib   Cont Metoprolol and Xarelto  * Htn   Cont metoprolol  * Hyperlipidemia  COnt atorvastatin   All the records are reviewed and case discussed with ED provider. Management plans discussed with the patient, family and they are in agreement.  CODE STATUS: DNR Code Status History    Date Active Date Inactive Code Status Order ID Comments User Context   05/09/2018 0027 05/12/2018 3875 DNR [id number]  [name], MD Inpatient   03/05/2018 0523 03/08/2018 2145 DNR [id number]  Harrie Foreman, MD Inpatient   01/11/2018 2202 01/14/2018 2043 DNR [id number]  Dustin Flock, MD ED   11/11/2017 1804 11/27/2017 2149 DNR [id number]  Hillary Bow, MD ED   11/11/2017 1731 11/11/2017 1804 Full Code [id number]  Hillary Bow, MD ED    Questions for Most Recent Historical Code Status (Order [id number])    Question Answer Comment   In the event of cardiac or respiratory ARREST Do not call a "code  blue"    In the event of cardiac or respiratory ARREST Do not perform Intubation, CPR, defibrillation or ACLS    In the event of cardiac or respiratory ARREST Use medication by any route, position, wound care, and other measures to relive pain and suffering. May use oxygen, suction and manual treatment of airway obstruction as needed for comfort.        TOTAL TIME TAKING CARE OF THIS PATIENT: 45 minutes.    [name] M.D on 06/26/2018   Between 7am to 6pm - Pager - [phone]  After 6pm go to [email or web address] - password [email or web address] [facility]  Office  [phone]  CC: Primary care physician; Pleas Koch, NP   Note: This dictation was prepared with Dragon dictation along with smaller phrase technology. Any transcriptional errors that result from this process are unintentional.

## 2018-06-26 NOTE — ED Notes (Signed)
Left leg red swollen from mid thigh down to left lower leg and foot, swelling present.

## 2018-06-26 NOTE — Consult Note (Signed)
Pharmacy Antibiotic Note  Heather Larson is a 85 y.o. female admitted on 06/26/2018 with sepsis/cellulitis  Pharmacy has been consulted for Vancomycin dosing.  Plan: Vancomycin 750 mg IV Q 24 hrs. Goal AUC 400-550. Expected AUC: 531 SCr used: 0.8   Height: 5\' 2"  (157.5 cm) Weight: 130 lb 1.1 oz (59 kg) IBW/kg (Calculated) : 50.1  Temp (24hrs), Avg:99 F (37.2 C), Min:99 F (37.2 C), Max:99 F (37.2 C)  Recent Labs  Lab 06/26/18 1037 06/26/18 1414 06/26/18 1534  WBC 47.5 Repeated and verified X2.* 48.0*  --   CREATININE  --  0.75  --   LATICACIDVEN  --  1.7 2.0*    Estimated Creatinine Clearance: 34.7 mL/min (by C-G formula based on SCr of 0.75 mg/dL).    No Known Allergies  Antimicrobials this admission: Vancomycin 2/26 >>   Dose adjustments this admission: N/A  Microbiology results: 2/26 BCx: pending  Thank you for allowing pharmacy to be a part of this patient's care.  Lu Duffel, PharmD, BCPS Clinical Pharmacist 06/26/2018 4:34 PM

## 2018-06-26 NOTE — Consult Note (Signed)
NAME: Heather Larson  DOB: [birth year]  MRN: [id number]  Date/Time: 06/26/2018 7:02 PM  REQUESTING PROVIDER: Dr. Anselm Jungling Subjective:  REASON FOR CONSULT: Cellulitis ? Heather Larson is a 85 y.o. female with a history of bilateral lower extremity edema, history of recurrent cellulitis, history of streptococcal bacteremia due to group G, breast cancer status post right mastectomy, C. difficile colitis, atrial fibrillation, presents from assisted living with lethargy, swelling and erythema of the left leg of 2 days duration. Patient's daughter at bedside. As per the daughter patient has had cellulitis of her legs at least 4 months in the past 5 years.  She used to live in [place] and moved to [place] 2 years ago.  July 2019 she had an episode of cellulitis along with group G streptococcal bacteremia and was hospitalized.  She was treated with IV antibiotics and then oral antibiotic.  She then developed C. difficile diarrhea.  She had to have fecal transplant in December.  In January she had another episode of cellulitis with group B streptococcus bacteremia.  C. difficile recurred.  She had to undergo another fecal transplant on May 31, 2018.  She has been on fidaxomicin.  She is not had any diarrhea since the second fecal transplant. She has fever, chills and feeling very tired and weak  Past Medical History:  Diagnosis Date  . Anemia   . Atrial fibrillation, permanent    on Xarelto  . Bilateral lower extremity edema   . Bilateral lower leg cellulitis 11/11/2017  . Cancer [facility])    breast cancer at age 89 and skin  . Cellulitis    lower abdomen  . CHF (congestive heart failure) ([place])   . Diarrhea 01/11/2018  . Dysrhythmia    a-fib  . Hyperlipidemia   . Hypertension   . Hypertension   . Incontinence   . Low sodium levels   . Macular degeneration   . Malnutrition of moderate degree 11/23/2017    Past Surgical History:  Procedure Laterality Date  . BREAST SURGERY     right  mastectomy  . COLONOSCOPY WITH PROPOFOL N/A 05/31/2018   Procedure: COLONOSCOPY WITH PROPOFOL;  Surgeon: Lin Landsman, MD;  Location: [facility] ENDOSCOPY;  Service: Gastroenterology;  Laterality: N/A;  . EYE SURGERY     x3  . 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 FECAL TRANSPLANT N/A 05/31/2018   Procedure: FECAL TRANSPLANT;  Surgeon: Lin Landsman, MD;  Location: [facility] ENDOSCOPY;  Service: Gastroenterology;  Laterality: N/A;  . IR RADIOLOGIST EVAL & MGMT  09/17/2017  . MASTECTOMY     right  . SHOULDER OPEN ROTATOR CUFF REPAIR     left  . SKIN BIOPSY     skin cancer     Social history Lives in assisted living Uses a walker No walker Occasional alcohol  Family History  Problem Relation Age of Onset  . Hypertension Mother    No Known Allergies ? Current Facility-Administered Medications  Medication Dose Route Frequency Provider Last Rate Last Dose  . fidaxomicin (DIFICID) tablet 200 mg  200 mg Oral BID Nena Polio, MD      . vancomycin (VANCOCIN) IVPB 1000 mg/200 mL premix  1,000 mg Intravenous Once Nena Polio, MD      . Derrill Memo ON 06/27/2018] vancomycin (VANCOCIN) IVPB 750 mg/150 ml premix  750 mg Intravenous Q24H Lu Duffel, [facility]  Current Outpatient Medications  Medication Sig Dispense Refill  . acetaminophen (TYLENOL) 325 MG tablet Take 2 tablets (650 mg total) by mouth every 6 (six) hours as needed for mild pain (or Fever >/= 101).    Marland Kitchen albuterol (PROVENTIL HFA;VENTOLIN HFA) 108 (90 Base) MCG/ACT inhaler Inhale 2 puffs into the lungs every 6 (six) hours as needed for wheezing or shortness of breath.    Marland Kitchen atorvastatin (LIPITOR) 10 MG tablet Take 1 tablet (10 mg total) by mouth daily. 90 tablet 3  . brimonidine (ALPHAGAN) 0.2 % ophthalmic solution Place 1 drop into both eyes 3 (three) times daily.  6  . calcium  carbonate (OSCAL) 1500 (600 Ca) MG TABS tablet Take by mouth 2 (two) times daily with a meal.    . diphenhydrAMINE HCl, Sleep, 25 MG CAPS Take 25-50 mg by mouth at bedtime as needed (sleep).    . dorzolamide (TRUSOPT) 2 % ophthalmic solution Place 1 drop into both eyes 3 (three) times daily.  6  . ferrous sulfate 325 (65 FE) MG EC tablet Take 325 mg by mouth daily with breakfast.    . fidaxomicin (DIFICID) 200 MG TABS tablet Take 200 mg by mouth daily.     . furosemide (LASIX) 20 MG tablet TAKE 1 TABLET(20 MG) BY MOUTH EVERY OTHER DAY (Patient taking differently: Take 20 mg by mouth daily. ) 45 tablet 2  . metoprolol succinate (TOPROL-XL) 100 MG 24 hr tablet Take 1 tablet (100 mg total) by mouth daily. Take with or immediately following a meal. 90 tablet 3  . Multiple Vitamins-Minerals (MULTIVITAMIN ADULT) TABS Take 1 tablet by mouth daily.    . Nutritional Supplements (NUTRITIONAL SHAKE PLUS PROTEIN PO) Take by mouth.    . Rivaroxaban (XARELTO) 15 MG TABS tablet Take 1 tablet (15 mg total) by mouth daily. 90 tablet 3  . TRAVATAN Z 0.004 % SOLN ophthalmic solution Place 1 drop into both eyes at bedtime.   5  . vitamin C (ASCORBIC ACID) 500 MG tablet Take 500 mg by mouth daily.    . cephALEXin (KEFLEX) 500 MG capsule Take 1 capsule (500 mg total) by mouth 3 (three) times daily for 7 days. 21 capsule 0  . dicyclomine (BENTYL) 10 MG capsule Take 1 capsule (10 mg total) by mouth 4 (four) times daily -  before meals and at bedtime. (Patient not taking: Reported on 06/26/2018) 90 capsule 0  . HYDROcodone-acetaminophen (NORCO/VICODIN) 5-325 MG tablet Take 1 tablet by mouth every 6 (six) hours as needed for moderate pain. (Patient not taking: Reported on 06/26/2018) 20 tablet 0     Abtx:  Anti-infectives (From admission, onward)   Start     Dose/Rate Route Frequency Ordered Stop   06/27/18 1600  vancomycin (VANCOCIN) IVPB 750 mg/150 ml premix     750 mg 150 mL/hr over 60 Minutes Intravenous Every 24  hours 06/26/18 1638     06/26/18 1515  fidaxomicin (DIFICID) tablet 200 mg     200 mg Oral 2 times daily 06/26/18 1503 07/06/18 0959   06/26/18 1515  vancomycin (VANCOCIN) IVPB 1000 mg/200 mL premix     1,000 mg 200 mL/hr over 60 Minutes Intravenous  Once 06/26/18 1503     06/26/18 1515  ceFEPIme (MAXIPIME) 1 g in sodium chloride 0.9 % 100 mL IVPB     1 g 200 mL/hr over 30 Minutes Intravenous  Once 06/26/18 1503 06/26/18 1636      REVIEW OF SYSTEMS:  Const:  fever,  chills,  weight loss Eyes: negative diplopia or visual changes, negative eye pain ENT: negative coryza, negative sore throat Resp: negative cough, hemoptysis, dyspnea Cards: negative for chest pain, palpitations, has lower extremity edema GU: negative for frequency, dysuria and hematuria GI: Negative for abdominal pain, diarrhea, bleeding, constipation Skin: Left thigh erythema, left leg erythema Heme: negative for easy bruising and gum/nose bleeding MS: myalgias, arthralgias, back pain and muscle weakness Neurolo:negative for headaches, dizziness, vertigo, memory problems  Psych: negative for feelings of anxiety, depression  Endocrine: No polyuria or polydipsia allergy/Immunology- negative for any medication or food allergies ? Pertinent Positives include : Objective:  VITALS:  BP (!) 142/70 (BP Location: Right Arm)   Pulse 84   Temp 99 F (37.2 C) (Oral)   Resp 18   Ht 5\' 2"  (1.575 m)   Wt 59 kg   SpO2 95%   BMI 23.79 kg/m  PHYSICAL EXAM:  General: Alert, cooperative, no distress, appears stated age.  Hard of hearing Head: Normocephalic, without obvious abnormality, atraumatic. Eyes: Conjunctivae clear, anicteric sclerae. Pupils are equal ENT Nares normal. No drainage or sinus tenderness. Lips, mucosa, and tongue normal. No Thrush Verrucous lesion on the lips Neck: Supple, symmetrical, no adenopathy, thyroid: non tender no carotid bruit and no JVD. Back: No CVA tenderness. Lungs: Clear to auscultation  bilaterally. No Wheezing or Rhonchi. No rales. Heart: Irregular Abdomen: Soft, non-tender,not distended. Bowel sounds normal. No masses Extremities:         Edema legs, left more than right Erythema of the whole of the left leg Not tender Webspace between the toes are dry and split Skin: No rashes or lesions. Or bruising Lymph: Cervical, supraclavicular normal. Neurologic: Grossly non-focal Pertinent Labs Lab Results CBC    Component Value Date/Time   WBC 48.0 (H) 06/26/2018 1414   RBC 4.29 06/26/2018 1414   HGB 11.3 (L) 06/26/2018 1414   HGB 10.8 (L) 03/26/2018 1609   HCT 34.9 (L) 06/26/2018 1414   HCT 32.2 (L) 03/26/2018 1609   PLT 299 06/26/2018 1414   PLT 431 03/26/2018 1609   MCV 81.4 06/26/2018 1414   MCV 83 03/26/2018 1609   MCH 26.3 06/26/2018 1414   MCHC 32.4 06/26/2018 1414   RDW 17.6 (H) 06/26/2018 1414   RDW 15.0 03/26/2018 1609   LYMPHSABS 1.3 06/26/2018 1414   LYMPHSABS 1.8 03/26/2018 1609   MONOABS 1.5 (H) 06/26/2018 1414   EOSABS 0.0 06/26/2018 1414   EOSABS 0.3 03/26/2018 1609   BASOSABS 0.1 06/26/2018 1414   BASOSABS 0.1 03/26/2018 1609    CMP Latest Ref Rng & Units 06/26/2018 05/24/2018 05/11/2018  Glucose 70 - 99 mg/dL 140(H) 96 108(H)  BUN 8 - 23 mg/dL 16 12 22   Creatinine 0.44 - 1.00 mg/dL 0.75 0.65 0.67  Sodium 135 - 145 mmol/L 127(L) 129(L) 128(L)  Potassium 3.5 - 5.1 mmol/L 3.9 3.8 3.2(L)  Chloride 98 - 111 mmol/L 94(L) 95(L) 98  CO2 22 - 32 mmol/L 21(L) 25 23  Calcium 8.9 - 10.3 mg/dL 8.9 8.8(L) 7.9(L)  Total Protein 6.5 - 8.1 g/dL 7.4 7.1 -  Total Bilirubin 0.3 - 1.2 mg/dL 1.1 0.7 -  Alkaline Phos 38 - 126 U/L 71 93 -  AST 15 - 41 U/L 32 19 -  ALT 0 - 44 U/L 19 15 -      Microbiology: Blood cultures sent on 06/26/2018 IMAGING RESULTS: Ultrasound of the lower extremities no DVT I have personally reviewed the films ? Impression/Recommendation ? ?85 year old female with history of  bilateral edema of the legs with recurrent  cellulitis and history of streptococcal group G bacteremia.  Cellulitis of the left leg with severe leukocytosis . Risk for recurrent cellulitis is the venous edema and also interdigital dryness and split skin with macerated area just likely athlete's foot. It is interesting that she develops a severe leukocytosis likely leukemoid reaction.  There is no evidence of abscess on the left leg.  C. difficile can cause severe leukocytosis but she does not have diarrhea currently. She is on vancomycin IV and p.o. fidaxomicin. Await blood culture.  ? __She will need compression bandage of the legs. Nystatin cream to the interdigital areas  This is her third episode of cellulitis in the last year and she may benefit from suppressive oral antibiotic therapy but with history of C. difficile needing fecal transplantation we need to weigh the benefits versus risk of suppressive antibiotic therapy.  Atrial fibrillation on metoprolol and Xarelto  _________________________________________________ Discussed with patient, and her daughter and requesting provider Note:  This document was prepared using Dndragon voice recognition software and may include unintentional dictation errors.

## 2018-06-26 NOTE — Progress Notes (Signed)
Subjective:    Patient ID: Heather Larson, female    DOB: [birth year], 85 y.o.   MRN: [id number]  HPI  [name] is a 85 year old female with a history of recurrent cellulitis, recurrent c-diff, CHF, atrial fibrillation who presents today with a chief complaint of cellulitis.  She noticed mild erythema to her lower abdomen two days ago, also with erythema to left lower extremity this morning. Over the last 24 hours she's felt more tired than usual, also with some nausea and shortness of breath. Her temperature was taken this morning by her nurse and was 100.7. She took 650 mg of Tylenol last night at bedtime, no Tylenol today.   She denies vomiting. 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 Overall has been doing well since the second transplant. She has not been on antibiotics in one month. Her daughter does have a prescription for Dificid on hand, has not yet started.   Review of Systems  Constitutional: Positive for fatigue and fever.  Respiratory: Positive for shortness of breath. Negative for cough.   Cardiovascular: Negative for chest pain.  Gastrointestinal: Positive for nausea. Negative for abdominal pain, diarrhea and vomiting.  Skin: Positive for color change.       Past Medical History:  Diagnosis Date  . Anemia   . Atrial fibrillation, permanent    on Xarelto  . Bilateral lower extremity edema   . Bilateral lower leg cellulitis 11/11/2017  . Cancer [facility])    breast cancer at age 32 and skin  . Cellulitis    lower abdomen  . CHF (congestive heart failure) ([place])   . Diarrhea 01/11/2018  . Dysrhythmia    a-fib  . Hyperlipidemia   . Hypertension   . Hypertension   . Incontinence   . Low sodium levels   . Macular degeneration   . Malnutrition of moderate degree 11/23/2017     Social History   Socioeconomic History  . Marital status: Divorced    Spouse name: Not on file  . Number of children: Not on file   . Years of education: Not on file  . Highest education level: Not on file  Occupational History  . Occupation: retired  Scientific laboratory technician  . Financial resource strain: Not on file  . Food insecurity:    Worry: Not on file    Inability: Not on file  . Transportation needs:    Medical: Not on file    Non-medical: Not on file  Tobacco Use  . Smoking status: Never Smoker  . Smokeless tobacco: Never Used  Substance and Sexual Activity  . Alcohol use: Not Currently    Alcohol/week: 2.0 standard drinks    Types: 2 Shots of liquor per week    Comment: 2 drinks daily  . Drug use: No  . Sexual activity: Not Currently  Lifestyle  . Physical activity:    Days per week: Not on file    Minutes per session: Not on file  . Stress: Not on file  Relationships  . Social connections:    Talks on phone: Not on file    Gets together: Not on file    Attends religious service: Not on file    Active member of club or organization: Not on file    Attends meetings of clubs or organizations: Not on file    Relationship status: Not on file  . Intimate partner violence:    Fear of  current or ex partner: Not on file    Emotionally abused: Not on file    Physically abused: Not on file    Forced sexual activity: Not on file  Other Topics Concern  . Not on file  Social History Narrative   Moved from [place].    Past Surgical History:  Procedure Laterality Date  . BREAST SURGERY     right mastectomy  . COLONOSCOPY WITH PROPOFOL N/A 05/31/2018   Procedure: COLONOSCOPY WITH PROPOFOL;  Surgeon: Lin Landsman, MD;  [facility] ENDOSCOPY;  Service: Gastroenterology;  Laterality: N/A;  . EYE SURGERY     x3  . 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 FECAL TRANSPLANT N/A 05/31/2018   Procedure: FECAL TRANSPLANT;  Surgeon: Lin Landsman, MD;  Location: [facility]  ENDOSCOPY;  Service: Gastroenterology;  Laterality: N/A;  . IR RADIOLOGIST EVAL & MGMT  09/17/2017  . MASTECTOMY     right  . SHOULDER OPEN ROTATOR CUFF REPAIR     left  . SKIN BIOPSY     skin cancer    No family history on file.  No Known Allergies  Current Outpatient Medications on File Prior to Visit  Medication Sig Dispense Refill  . acetaminophen (TYLENOL) 325 MG tablet Take 2 tablets (650 mg total) by mouth every 6 (six) hours as needed for mild pain (or Fever >/= 101).    Marland Kitchen albuterol (PROVENTIL HFA;VENTOLIN HFA) 108 (90 Base) MCG/ACT inhaler Inhale 2 puffs into the lungs every 6 (six) hours as needed for wheezing or shortness of breath.    Marland Kitchen atorvastatin (LIPITOR) 10 MG tablet Take 1 tablet (10 mg total) by mouth daily. 90 tablet 3  . brimonidine (ALPHAGAN) 0.2 % ophthalmic solution Place 1 drop into both eyes 3 (three) times daily.  6  . calcium carbonate (OSCAL) 1500 (600 Ca) MG TABS tablet Take by mouth 2 (two) times daily with a meal.    . dicyclomine (BENTYL) 10 MG capsule Take 1 capsule (10 mg total) by mouth 4 (four) times daily -  before meals and at bedtime. 90 capsule 0  . dorzolamide (TRUSOPT) 2 % ophthalmic solution Place 1 drop into both eyes 3 (three) times daily.  6  . ferrous sulfate 325 (65 FE) MG EC tablet Take 325 mg by mouth daily with breakfast.    . fidaxomicin (DIFICID) 200 MG TABS tablet Take 200 mg by mouth 2 (two) times daily.    . furosemide (LASIX) 20 MG tablet TAKE 1 TABLET(20 MG) BY MOUTH EVERY OTHER DAY (Patient taking differently: Take 20 mg by mouth daily. ) 45 tablet 2  . HYDROcodone-acetaminophen (NORCO/VICODIN) 5-325 MG tablet Take 1 tablet by mouth every 6 (six) hours as needed for moderate pain. 20 tablet 0  . Multiple Vitamins-Minerals (MULTIVITAMIN ADULT) TABS Take 1 tablet by mouth daily.    . Nutritional Supplements (NUTRITIONAL SHAKE PLUS PROTEIN PO) Take by mouth.    . Rivaroxaban (XARELTO) 15 MG TABS tablet Take 1 tablet (15 mg total) by  mouth daily. 90 tablet 3  . TRAVATAN Z 0.004 % SOLN ophthalmic solution Place 1 drop into both eyes at bedtime.   5  . vitamin C (ASCORBIC ACID) 500 MG tablet Take 500 mg by mouth daily.    . metoprolol succinate (TOPROL-XL) 100 MG 24 hr tablet Take 1 tablet (100 mg total) by mouth daily. Take with  or immediately following a meal. 90 tablet 3   No current facility-administered medications on file prior to visit.     BP 132/86   Pulse (!) 112   Temp 99 F (37.2 C) (Oral)   Ht 5\' 2"  (1.575 m)   Wt 130 lb 12 oz (59.3 kg)   SpO2 98%   BMI 23.91 kg/m    Objective:   Physical Exam  Constitutional: She appears well-nourished.  Appears tired but not sickly  Cardiovascular: An irregularly irregular rhythm present.  Respiratory: Effort normal and breath sounds normal. She has no wheezes.  GI: Soft. Bowel sounds are normal. There is no abdominal tenderness.  Skin: Skin is warm and dry. There is erythema.  Moderate erythema to left upper thigh down to mid thigh medially and laterally. Moderate erythema to left buttocks. Mild erythema to bilateral lower abdomen.            Assessment & Plan:

## 2018-06-26 NOTE — Patient Instructions (Addendum)
Stop by the lab prior to leaving today. I will notify you of your results once received.   Start the Dificid medication as prescribed by Dr. Marius Ditch.  Start Tylenol 650 mg every 8 hours for fevers.   Go to the hospital if you feel worse (weak, short of breath), fevers don't reduce with Tylenol.   Follow up with Dermatology as discussed.   It was a pleasure to see you today!

## 2018-06-26 NOTE — ED Notes (Signed)
ED TO INPATIENT HANDOFF REPORT  Name/Age/Gender Heather Larson 85 y.o. female  Code Status Code Status History    Date Active Date Inactive Code Status Order ID Comments User Context   05/09/2018 0027 05/12/2018 1779 DNR [id number]  [name], MD Inpatient   03/05/2018 0523 03/08/2018 2145 DNR [id number]  Harrie Foreman, MD Inpatient   01/11/2018 2202 01/14/2018 2043 DNR [id number]  Dustin Flock, MD ED   11/11/2017 1804 11/27/2017 2149 DNR [id number]  Hillary Bow, MD ED   11/11/2017 1731 11/11/2017 1804 Full Code [id number]  Hillary Bow, MD ED    Questions for Most Recent Historical Code Status (Order [id number])    Question Answer Comment   In the event of cardiac or respiratory ARREST Do not call a "code blue"    In the event of cardiac or respiratory ARREST Do not perform Intubation, CPR, defibrillation or ACLS    In the event of cardiac or respiratory ARREST Use medication by any route, position, wound care, and other measures to relive pain and suffering. May use oxygen, suction and manual treatment of airway obstruction as needed for comfort.       Home/SNF/Other [place]   Chief Complaint abnormal labs  Level of Care/Admitting Diagnosis ED Disposition    ED Disposition Condition [facility] Area: [place] [[id number]]  Level of Care: Med-Surg [16]  Diagnosis: Recurrent cellulitis of lower extremity [[id number]]  Admitting Physician: [name] [[id number]]  Attending Physician: [name] [phone]  Estimated length of stay: past midnight tomorrow  Certification:: I certify this patient will need inpatient services for at least 2 midnights  PT Class (Do Not Modify): Inpatient [101]  PT Acc Code (Do Not Modify): Private [1]       Medical History Past Medical History:  Diagnosis Date  . Anemia   . Atrial fibrillation, permanent    on Xarelto  . Bilateral lower extremity edema   . Bilateral lower leg  cellulitis 11/11/2017  . Cancer [facility])    breast cancer at age 85 and skin  . Cellulitis    lower abdomen  . CHF (congestive heart failure) ([place])   . Diarrhea 01/11/2018  . Dysrhythmia    a-fib  . Hyperlipidemia   . Hypertension   . Hypertension   . Incontinence   . Low sodium levels   . Macular degeneration   . Malnutrition of moderate degree 11/23/2017    Allergies No Known Allergies  IV Location/Drains/Wounds Patient Lines/Drains/Airways Status   Active Line/Drains/Airways    Name:   Placement date:   Placement time:   Site:   Days:   Peripheral IV 05/08/18 Left Arm   05/08/18    1602    Arm   49   Peripheral IV 05/08/18 Left Hand   05/08/18    1616    Hand   49   Peripheral IV 06/26/18 Left Forearm   06/26/18    1422    Forearm   less than 1   External Urinary Catheter   03/05/18    0531    -   113          Labs/Imaging Results for orders placed or performed during the hospital encounter of 06/26/18 (from the past 48 hour(s))  Lactic acid, plasma     Status: None   Collection Time: 06/26/18  2:14 PM  Result Value Ref Range   Lactic Acid, Venous 1.7 0.5 - 1.9 mmol/L  Comment: Performed at [facility], [address] [place], [place] [address]  Comprehensive metabolic panel     Status: Abnormal   Collection Time: 06/26/18  2:14 PM  Result Value Ref Range   Sodium 127 (L) 135 - 145 mmol/L   Potassium 3.9 3.5 - 5.1 mmol/L   Chloride 94 (L) 98 - 111 mmol/L   CO2 21 (L) 22 - 32 mmol/L   Glucose, Bld 140 (H) 70 - 99 mg/dL   BUN 16 8 - 23 mg/dL   Creatinine, Ser 0.75 0.44 - 1.00 mg/dL   Calcium 8.9 8.9 - 10.3 mg/dL   Total Protein 7.4 6.5 - 8.1 g/dL   Albumin 3.7 3.5 - 5.0 g/dL   AST 32 15 - 41 U/L   ALT 19 0 - 44 U/L   Alkaline Phosphatase 71 38 - 126 U/L   Total Bilirubin 1.1 0.3 - 1.2 mg/dL   GFR calc non Af Amer >60 >60 mL/min   GFR calc Af Amer >60 >60 mL/min   Anion gap 12 5 - 15    Comment: Performed at [facility], [address] [place], [place] [address]  CBC with Differential     Status: Abnormal   Collection Time: 06/26/18  2:14 PM  Result Value Ref Range   WBC 48.0 (H) 4.0 - 10.5 K/uL   RBC 4.29 3.87 - 5.11 MIL/uL   Hemoglobin 11.3 (L) 12.0 - 15.0 g/dL   HCT 34.9 (L) 36.0 - 46.0 %   MCV 81.4 80.0 - 100.0 fL   MCH 26.3 26.0 - 34.0 pg   MCHC 32.4 30.0 - 36.0 g/dL   RDW 17.6 (H) 11.5 - 15.5 %   Platelets 299 150 - 400 K/uL   nRBC 0.0 0.0 - 0.2 %   Neutrophils Relative % 91 %   Neutro Abs 43.6 (H) 1.7 - 7.7 K/uL   Lymphocytes Relative 3 %   Lymphs Abs 1.3 0.7 - 4.0 K/uL   Monocytes Relative 3 %   Monocytes Absolute 1.5 (H) 0.1 - 1.0 K/uL   Eosinophils Relative 0 %   Eosinophils Absolute 0.0 0.0 - 0.5 K/uL   Basophils Relative 0 %   Basophils Absolute 0.1 0.0 - 0.1 K/uL   WBC Morphology MORPHOLOGY UNREMARKABLE    RBC Morphology MORPHOLOGY UNREMARKABLE    Smear Review MORPHOLOGY UNREMARKABLE    Immature Granulocytes 3 %   Abs Immature Granulocytes 1.44 (H) 0.00 - 0.07 K/uL    Comment: Performed at [facility], [address] [place], [place] [address]  Lactic acid, plasma     Status: Abnormal   Collection Time: 06/26/18  3:34 PM  Result Value Ref Range   Lactic Acid, Venous 2.0 (HH) 0.5 - 1.9 mmol/L    Comment: CRITICAL RESULT CALLED TO, READ BACK BY AND VERIFIED WITH TOM NAGY 06/26/18 1604 KLW Performed at [facility], [address] [place], [place] [address]   Lactic acid, plasma     Status: None   Collection Time: 06/26/18  6:20 PM  Result Value Ref Range   Lactic Acid, Venous 1.3 0.5 - 1.9 mmol/L    Comment: Performed at [facility], [address] [place], [place] [address]   US Venous Img Lower Unilateral Left  Result Date: 06/26/2018 CLINICAL DATA:  Left lower extremity pain and edema. Evaluate for DVT. EXAM: LEFT LOWER EXTREMITY VENOUS DOPPLER ULTRASOUND TECHNIQUE: Gray-scale sonography with graded compression, as well as color Doppler and duplex  ultrasound were performed to evaluate the lower extremity deep venous systems from the level of the common femoral vein and including the common femoral, femoral, profunda femoral, popliteal and calf veins including the posterior tibial, peroneal and gastrocnemius veins when visible. The superficial great saphenous vein was also interrogated. Spectral Doppler was utilized to evaluate flow at rest and with distal augmentation maneuvers in the common femoral, femoral and popliteal veins. COMPARISON:  None. FINDINGS: Contralateral Common Femoral Vein: Respiratory phasicity is normal and symmetric with the symptomatic side. No evidence of thrombus. Normal compressibility. Common Femoral Vein: No evidence of thrombus. Normal compressibility, respiratory phasicity and response to augmentation. Saphenofemoral Junction: No evidence of thrombus. Normal compressibility and flow on color Doppler imaging. Profunda Femoral Vein: No evidence of thrombus. Normal compressibility and flow on color Doppler imaging. Femoral Vein: No evidence of thrombus. Normal compressibility, respiratory phasicity and response to augmentation. Popliteal Vein: No evidence of thrombus. Normal compressibility, respiratory phasicity and response to augmentation. Calf Veins: Appear patent where imaged. Superficial Great Saphenous Vein: No evidence of thrombus. Normal compressibility. Venous Reflux:  None. Other Findings:  None. IMPRESSION: No evidence of DVT within the left lower extremity. Electronically Signed   By: Sandi Mariscal M.D.   On: 06/26/2018 16:40    Pending Labs Unresulted Labs (From admission, onward)    Start     Ordered   06/26/18 1503  Blood Culture (routine x 2)  BLOOD CULTURE X 2,   STAT     06/26/18 1503   06/26/18 1503  Urinalysis, Routine w reflex microscopic  ONCE - STAT,   STAT     06/26/18 1503   06/26/18 1414  Urinalysis, Complete w Microscopic  ONCE - STAT,   STAT     06/26/18 1413   Signed and Held  Basic metabolic  panel  Tomorrow morning,   R     Signed and Held   Signed and Held  CBC  Tomorrow morning,   R     Signed and Held          Vitals/Pain Today's Vitals   06/26/18 1930 06/26/18 2145 06/26/18 2215 06/26/18 2315  BP: 133/68 137/69 134/63 (!) 120/53  Pulse: 93 89 88 78  Resp: (!) 21 20 20  (!) 21  Temp:      TempSrc:      SpO2: 94% 95% 96% 93%  Weight:      Height:      PainSc:   0-No pain     Isolation Precautions No active isolations  Medications Medications  fidaxomicin (DIFICID) tablet 200 mg (has no administration in time range)  vancomycin (VANCOCIN) IVPB 750 mg/150 ml premix (has no administration in time range)  nystatin cream (MYCOSTATIN) (has no administration in time range)  vancomycin (VANCOCIN) IVPB 1000 mg/200 mL premix (0 mg Intravenous Stopped 06/26/18 1740)  ceFEPIme (MAXIPIME) 1 g in sodium chloride 0.9 % 100 mL IVPB (0 g Intravenous Stopped 06/26/18 1636)    Mobility walks with device

## 2018-06-26 NOTE — Assessment & Plan Note (Addendum)
Evident again on today's visit. Contacted GI today who recommended treatment with Dificid to prevent recurrent c-diff. Rx for Cephalexin sent to pharmacy for cellulitis treatment. She appears stable to treat in the outpatient setting but strict hospital precautions were provided. Check CBC today.

## 2018-06-26 NOTE — Telephone Encounter (Signed)
[facility] lab called a critical WBC 47.5, results given to [facility].

## 2018-06-26 NOTE — Consult Note (Signed)
CODE SEPSIS - PHARMACY COMMUNICATION  **Broad Spectrum Antibiotics should be administered within 1 hour of Sepsis diagnosis**  Time Code Sepsis Called/Page Received: 1512  Antibiotics Ordered: Vancomycin/Cefepime/Dificid  Time of 1st antibiotic administration: 7530  Additional action taken by pharmacy: Spoke with Marcello Moores @ 1600 who had abx and was en route to administer  If necessary, Name of Provider/Nurse Contacted: Barnabas Lister ,PharmD Clinical Pharmacist  06/26/2018  3:12 PM

## 2018-06-26 NOTE — Progress Notes (Signed)
ID Full consult note to follow Recurrent cellulitis of the legs H/o recurrent streptococcus G bacteremia- July 2019 and JAn 2020 Cdiff colitis recurrent needing fecal transplant X 2  Last one 05/31/18  Admitted with sudden onset erythema of the left leg and fever Severe leucocytosis Left leg cellulitis Has been started on vancomycin IV + oral fidoxamicin If the response to vanco is slow or not adequate will need cefazolin Keep leg elevated Discussed the management with patient and her daughter

## 2018-06-26 NOTE — ED Triage Notes (Signed)
Pt to ER via POV c/o left leg cellulitis that appeared this AM. Pt went to PCP and had WBC of 47,000. Sent to ER for IV antibiotics. Pt alert and oriented X4, active, cooperative, pt in NAD. RR even and unlabored, color WNL.

## 2018-06-26 NOTE — Telephone Encounter (Signed)
Patient was notified of lab results.  It was recommended she go to the hospital for further treatment and evaluation of uncontrolled cellulitis.  Her daughter verbalized understanding.  We called report to [facility] ED.

## 2018-06-26 NOTE — Addendum Note (Signed)
Addended by: Pleas Koch on: 06/26/2018 10:45 AM   Modules accepted: Orders

## 2018-06-27 ENCOUNTER — Other Ambulatory Visit: Payer: Self-pay

## 2018-06-27 LAB — BASIC METABOLIC PANEL
Anion gap: 9 (ref 5–15)
BUN: 19 mg/dL (ref 8–23)
CALCIUM: 8.3 mg/dL — AB (ref 8.9–10.3)
CHLORIDE: 95 mmol/L — AB (ref 98–111)
CO2: 21 mmol/L — ABNORMAL LOW (ref 22–32)
CREATININE: 0.63 mg/dL (ref 0.44–1.00)
GFR calc Af Amer: >60 mL/min (ref >60)
GFR calc non Af Amer: >60 mL/min (ref >60)
Glucose, Bld: 104 mg/dL — ABNORMAL HIGH (ref 70–99)
Potassium: 3.4 mmol/L — ABNORMAL LOW (ref 3.5–5.1)
Sodium: 125 mmol/L — ABNORMAL LOW (ref 135–145)

## 2018-06-27 LAB — URINALYSIS, COMPLETE (UACMP) WITH MICROSCOPIC
Bilirubin Urine: NEGATIVE
Glucose, UA: NEGATIVE mg/dL
KETONES UR: 5 mg/dL — AB
Nitrite: NEGATIVE
Protein, ur: 30 mg/dL — AB
Specific Gravity, Urine: 1.021 (ref 1.005–1.030)
pH: 5 (ref 5.0–8.0)

## 2018-06-27 LAB — C DIFFICILE QUICK SCREEN W PCR REFLEX
C Diff antigen: NEGATIVE
C Diff interpretation: NOT DETECTED
C Diff toxin: NEGATIVE

## 2018-06-27 LAB — OSMOLALITY, URINE: Osmolality, Ur: 684 mOsm/kg (ref 300–900)

## 2018-06-27 LAB — SODIUM, URINE, RANDOM: Sodium, Ur: <10 mmol/L

## 2018-06-27 LAB — CBC
HEMATOCRIT: 31.1 % — AB (ref 36.0–46.0)
Hemoglobin: 10.1 g/dL — ABNORMAL LOW (ref 12.0–15.0)
MCH: 26.2 pg (ref 26.0–34.0)
MCHC: 32.5 g/dL (ref 30.0–36.0)
MCV: 80.8 fL (ref 80.0–100.0)
PLATELETS: 247 10*3/uL (ref 150–400)
RBC: 3.85 MIL/uL — ABNORMAL LOW (ref 3.87–5.11)
RDW: 17.6 % — ABNORMAL HIGH (ref 11.5–15.5)
WBC: 34.5 10*3/uL — ABNORMAL HIGH (ref 4.0–10.5)
nRBC: 0 % (ref 0.0–0.2)

## 2018-06-27 LAB — OSMOLALITY: Osmolality: 264 mOsm/kg — ABNORMAL LOW (ref 275–295)

## 2018-06-27 LAB — MRSA PCR SCREENING: MRSA by PCR: POSITIVE — AB

## 2018-06-27 MED ORDER — FERROUS SULFATE 325 (65 FE) MG PO TABS
325.0000 mg | ORAL_TABLET | Freq: Every day | ORAL | Status: DC
  Administered 2018-06-27 – 2018-07-01 (×5): 325 mg via ORAL
  Filled 2018-06-27 (×5): qty 1

## 2018-06-27 MED ORDER — ATORVASTATIN CALCIUM 20 MG PO TABS
10.0000 mg | ORAL_TABLET | Freq: Every day | ORAL | Status: DC
  Administered 2018-06-27 – 2018-06-30 (×4): 10 mg via ORAL
  Filled 2018-06-27 (×4): qty 1

## 2018-06-27 MED ORDER — RISAQUAD PO CAPS
1.0000 | ORAL_CAPSULE | Freq: Every day | ORAL | Status: DC
  Administered 2018-06-27 – 2018-07-01 (×5): 1 via ORAL
  Filled 2018-06-27 (×5): qty 1

## 2018-06-27 MED ORDER — FIDAXOMICIN 200 MG PO TABS: 200.0000 mg | ORAL_TABLET | Freq: Two times a day (BID) | ORAL | Status: DC

## 2018-06-27 MED ORDER — CALCIUM CARBONATE ANTACID 500 MG PO CHEW
600.0000 mg | CHEWABLE_TABLET | Freq: Two times a day (BID) | ORAL | Status: DC
  Administered 2018-06-27 – 2018-07-01 (×9): 600 mg via ORAL
  Filled 2018-06-27 (×9): qty 3

## 2018-06-27 MED ORDER — BRIMONIDINE TARTRATE 0.2 % OP SOLN
1.0000 [drp] | Freq: Three times a day (TID) | OPHTHALMIC | Status: DC
  Administered 2018-06-27 – 2018-07-01 (×10): 1 [drp] via OPHTHALMIC
  Filled 2018-06-27: qty 5

## 2018-06-27 MED ORDER — DORZOLAMIDE HCL 2 % OP SOLN
1.0000 [drp] | Freq: Three times a day (TID) | OPHTHALMIC | Status: DC
  Administered 2018-06-27 – 2018-07-01 (×9): 1 [drp] via OPHTHALMIC
  Filled 2018-06-27: qty 10

## 2018-06-27 MED ORDER — METOPROLOL SUCCINATE ER 50 MG PO TB24
100.0000 mg | ORAL_TABLET | Freq: Every day | ORAL | Status: DC
  Administered 2018-06-27 – 2018-07-01 (×5): 100 mg via ORAL
  Filled 2018-06-27 (×5): qty 2

## 2018-06-27 MED ORDER — HYDROCODONE-ACETAMINOPHEN 5-325 MG PO TABS: 1.0000 | ORAL_TABLET | Freq: Four times a day (QID) | ORAL | Status: DC | PRN

## 2018-06-27 MED ORDER — LATANOPROST 0.005 % OP SOLN
1.0000 [drp] | Freq: Every day | OPHTHALMIC | Status: DC
  Administered 2018-06-27 – 2018-06-30 (×3): 1 [drp] via OPHTHALMIC
  Filled 2018-06-27: qty 2.5

## 2018-06-27 MED ORDER — DOCUSATE SODIUM 100 MG PO CAPS: 100.0000 mg | ORAL_CAPSULE | Freq: Two times a day (BID) | ORAL | Status: DC | PRN

## 2018-06-27 MED ORDER — VITAMIN C 500 MG PO TABS
500.0000 mg | ORAL_TABLET | Freq: Every day | ORAL | Status: DC
  Administered 2018-06-27 – 2018-07-01 (×5): 500 mg via ORAL
  Filled 2018-06-27 (×5): qty 1

## 2018-06-27 MED ORDER — ACETAMINOPHEN 325 MG PO TABS: 650.0000 mg | ORAL_TABLET | Freq: Four times a day (QID) | ORAL | Status: DC | PRN

## 2018-06-27 MED ORDER — RIVAROXABAN 15 MG PO TABS
15.0000 mg | ORAL_TABLET | Freq: Every day | ORAL | Status: DC
  Administered 2018-06-27 – 2018-06-30 (×4): 15 mg via ORAL
  Filled 2018-06-27 (×5): qty 1

## 2018-06-27 MED ORDER — DICYCLOMINE HCL 10 MG PO CAPS
10.0000 mg | ORAL_CAPSULE | Freq: Three times a day (TID) | ORAL | Status: DC
  Administered 2018-06-27 – 2018-07-01 (×16): 10 mg via ORAL
  Filled 2018-06-27 (×21): qty 1

## 2018-06-27 MED ORDER — ADULT MULTIVITAMIN W/MINERALS CH
1.0000 | ORAL_TABLET | Freq: Every day | ORAL | Status: DC
  Administered 2018-06-27 – 2018-07-01 (×5): 1 via ORAL
  Filled 2018-06-27 (×5): qty 1

## 2018-06-27 MED ORDER — ALBUTEROL SULFATE (2.5 MG/3ML) 0.083% IN NEBU: 2.5000 mg | INHALATION_SOLUTION | Freq: Four times a day (QID) | RESPIRATORY_TRACT | Status: DC | PRN

## 2018-06-27 NOTE — Care Management Note (Addendum)
Case Management Note  Patient Details  Name: Neysa Arts MRN: [id number] Date of Birth: 1924-02-05  Subjective/Objective:   Admitted to [facility] with the diagnosis of recurrent cellulitis. A resident of [place]. Daughter is Dossie Der [phone]). Last seen Dr, Carlis Abbott 06/26/18. Prescriptions are filled at [facility].  Self feed, Self dress, needs help with baths. Walks to dining hall for meals when she feels like it or they bring meals to her room. Wears monitor at [facility]. Currently receiving home health services per [facility].  Rolling walker and cane in the home.                   Action/Plan: Received referral for may need Dificid medication at discharge. Currently receiving Dificid 200mg  po 2 x day.  Spoke with daughter, Shauna Hugh. Last took Dificid March 2019. Telephone call to [facility]. [phone]). States they do not have this medication in stock. They would need to run the prescription to find out price. Then it could be shipped over night.  Telephone to [facility],  They do have Dificid in stock.  Price for 20 tablets would be $4,701.00. They would need to run prescription to see if insurance would pay for this medication and if it needs to be pre-authorized.   Fax Number: [phone] Will continue to follow for transition of care needs.    Expected Discharge Date:                  Expected Discharge Plan:     In-House Referral:   yes  Discharge planning Services   yes  Post Acute Care Choice:    Choice offered to:     DME Arranged:    DME Agency:     HH Arranged:    HH Agency:     Status of Service:     If discussed at [employer] of Stay Meetings, dates discussed:    Additional Comments:  [name], RN MSN CCM Care Management [phone] 06/27/2018, 1:56 PM

## 2018-06-27 NOTE — Plan of Care (Signed)
  Problem: Education: Goal: Knowledge of General Education information will improve Description: Including pain rating scale, medication(s)/side effects and non-pharmacologic comfort measures Outcome: Progressing   Problem: Health Behavior/Discharge Planning: Goal: Ability to manage health-related needs will improve Outcome: Progressing   Problem: Clinical Measurements: Goal: Ability to maintain clinical measurements within normal limits will improve Outcome: Progressing   Problem: Nutrition: Goal: Adequate nutrition will be maintained Outcome: Progressing   Problem: Coping: Goal: Level of anxiety will decrease Outcome: Progressing   Problem: Elimination: Goal: Will not experience complications related to bowel motility Outcome: Progressing Goal: Will not experience complications related to urinary retention Outcome: Progressing   Problem: Pain Managment: Goal: General experience of comfort will improve Outcome: Progressing   Problem: Safety: Goal: Ability to remain free from injury will improve Outcome: Progressing   Problem: Skin Integrity: Goal: Risk for impaired skin integrity will decrease Outcome: Progressing   

## 2018-06-27 NOTE — Progress Notes (Addendum)
[facility] [place] NAME: Heather Larson    MR#:  [id number]  DATE OF BIRTH:  [birth year]  SUBJECTIVE:  CHIEF COMPLAINT:   Chief Complaint  Patient presents with  . Cellulitis  . Abnormal Lab   -Left lower extremity swelling and redness all the way to the groin -Complains of diarrhea  REVIEW OF SYSTEMS:  Review of Systems  Constitutional: Negative for chills, fever and malaise/fatigue.  HENT: Negative for congestion, ear discharge, hearing loss and nosebleeds.   Respiratory: Negative for cough, shortness of breath and wheezing.   Cardiovascular: Positive for leg swelling. Negative for chest pain and palpitations.  Gastrointestinal: Positive for diarrhea and nausea. Negative for abdominal pain, constipation and vomiting.  Genitourinary: Negative for dysuria.  Neurological: Negative for dizziness, focal weakness, seizures, weakness and headaches.  Psychiatric/Behavioral: Negative for depression.    DRUG ALLERGIES:  No Known Allergies  VITALS:  Blood pressure 126/69, pulse 63, temperature 97.9 F (36.6 C), temperature source Oral, resp. rate 19, height 5\' 2"  (1.575 m), weight 59 kg, SpO2 94 %.  PHYSICAL EXAMINATION:  Physical Exam   GENERAL:  86 y.o.-year-old elderly patient lying in the bed with no acute distress.  EYES: Pupils equal, round, reactive to light and accommodation. No scleral icterus. Extraocular muscles intact.  HEENT: Head atraumatic, normocephalic. Oropharynx and nasopharynx clear.  NECK:  Supple, no jugular venous distention. No thyroid enlargement, no tenderness.  LUNGS: Normal breath sounds bilaterally, no wheezing, rales,rhonchi or crepitation. No use of accessory muscles of respiration. Decreased bibasilar breath sounds CARDIOVASCULAR: S1, S2 normal. No  rubs, or gallops.  3/6 systolic murmur is present ABDOMEN: Soft, nontender, nondistended. Bowel sounds present. No organomegaly or mass.  EXTREMITIES: No   cyanosis, or clubbing.  Bilateral lower extremity edema but much worse on the left leg with significant erythema to the groin Palpable bilateral dorsalis pedis pulses NEUROLOGIC: Cranial nerves II through XII are intact. Muscle strength 5/5 in all extremities. Sensation intact. Gait not checked.  PSYCHIATRIC: The patient is alert and oriented x 3.  SKIN: No obvious rash, lesion, or ulcer.          LABORATORY PANEL:   CBC Recent Labs  Lab 06/27/18 0336  WBC 34.5*  HGB 10.1*  HCT 31.1*  PLT 247   ------------------------------------------------------------------------------------------------------------------  Chemistries  Recent Labs  Lab 06/26/18 1414 06/27/18 0336  NA 127* 125*  K 3.9 3.4*  CL 94* 95*  CO2 21* 21*  GLUCOSE 140* 104*  BUN 16 19  CREATININE 0.75 0.63  CALCIUM 8.9 8.3*  AST 32  --   ALT 19  --   ALKPHOS 71  --   BILITOT 1.1  --    ------------------------------------------------------------------------------------------------------------------  Cardiac Enzymes No results for input(s): TROPONINI in the last 168 hours. ------------------------------------------------------------------------------------------------------------------  RADIOLOGY:  US Venous Img Lower Unilateral Left  Result Date: 06/26/2018 CLINICAL DATA:  Left lower extremity pain and edema. Evaluate for DVT. EXAM: LEFT LOWER EXTREMITY VENOUS DOPPLER ULTRASOUND TECHNIQUE: Gray-scale sonography with graded compression, as well as color Doppler and duplex ultrasound were performed to evaluate the lower extremity deep venous systems from the level of the common femoral vein and including the common femoral, femoral, profunda femoral, popliteal and calf veins including the posterior tibial, peroneal and gastrocnemius veins when visible. The superficial great saphenous vein was also interrogated. Spectral Doppler was utilized to evaluate flow at rest and with distal augmentation maneuvers in  the common femoral, femoral and popliteal  veins. COMPARISON:  None. FINDINGS: Contralateral Common Femoral Vein: Respiratory phasicity is normal and symmetric with the symptomatic side. No evidence of thrombus. Normal compressibility. Common Femoral Vein: No evidence of thrombus. Normal compressibility, respiratory phasicity and response to augmentation. Saphenofemoral Junction: No evidence of thrombus. Normal compressibility and flow on color Doppler imaging. Profunda Femoral Vein: No evidence of thrombus. Normal compressibility and flow on color Doppler imaging. Femoral Vein: No evidence of thrombus. Normal compressibility, respiratory phasicity and response to augmentation. Popliteal Vein: No evidence of thrombus. Normal compressibility, respiratory phasicity and response to augmentation. Calf Veins: Appear patent where imaged. Superficial Great Saphenous Vein: No evidence of thrombus. Normal compressibility. Venous Reflux:  None. Other Findings:  None. IMPRESSION: No evidence of DVT within the left lower extremity. Electronically Signed   By: Sandi Mariscal M.D.   On: 06/26/2018 16:40    EKG:   Orders placed or performed during the hospital encounter of 06/26/18  . ED EKG  . ED EKG 12-Lead  . ED EKG  . ED EKG 12-Lead  . EKG 12-Lead  . EKG 12-Lead    ASSESSMENT AND PLAN:   85 year old female with past medical history significant for A. 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  Recurrent left lower extremity cellulitis-Dopplers negative for DVT -Prior history of MRSA infections-but also had recurrent Streptococcus bacteremia's -Currently on vancomycin.  If slow improvement noted, will consider  Ancef -WBC is improving -ID consult appreciated -Keep the leg elevated   2.  Recurrent  C. difficile history-status post second stool transplant about a month ago. -Now started on vancomycin and having loose stools. -Recheck C. difficile antigen.  Empirically started on fidaxomicin  3.  Hyponatremia-known history of SIADH -Chronically low sodium, much lower today. -Serum osmolality is low.  And osmolarity and sodium are pending -Nephrology consulted. -On salt tablets in the past, currently none.  Hold Lasix seems to be dehydrated  4.  A. fib-rate controlled.  On metoprolol.  Also on Xarelto for anticoagulation.  5.  Upper lipidemia-statin  Independent at baseline.  Ambulates with a cane. -Physical therapy consult   All the records are reviewed and case discussed with Care Management/Social Workerr. Management plans discussed with the patient, family and they are in agreement.  CODE STATUS: DNR  TOTAL TIME TAKING CARE OF THIS PATIENT: 39 minutes.   POSSIBLE D/C IN 2 DAYS, DEPENDING ON CLINICAL CONDITION.   Gladstone Lighter M.D on 06/27/2018 at 1:42 PM  Between 7am to 6pm - Pager - [phone]  After 6pm go to [email or web address] - Proofreader  [facility]  Office  [phone]  CC: Primary care physician; Pleas Koch, NP

## 2018-06-28 DIAGNOSIS — I4891 Unspecified atrial fibrillation: Secondary | ICD-10-CM

## 2018-06-28 LAB — BASIC METABOLIC PANEL
Anion gap: 6 (ref 5–15)
BUN: 18 mg/dL (ref 8–23)
CO2: 23 mmol/L (ref 22–32)
Calcium: 8.3 mg/dL — ABNORMAL LOW (ref 8.9–10.3)
Chloride: 96 mmol/L — ABNORMAL LOW (ref 98–111)
Creatinine, Ser: 0.55 mg/dL (ref 0.44–1.00)
GFR calc Af Amer: >60 mL/min (ref >60)
GFR calc non Af Amer: >60 mL/min (ref >60)
Glucose, Bld: 103 mg/dL — ABNORMAL HIGH (ref 70–99)
Potassium: 3.6 mmol/L (ref 3.5–5.1)
SODIUM: 125 mmol/L — AB (ref 135–145)

## 2018-06-28 LAB — CBC
HCT: 31.3 % — ABNORMAL LOW (ref 36.0–46.0)
Hemoglobin: 10 g/dL — ABNORMAL LOW (ref 12.0–15.0)
MCH: 25.4 pg — AB (ref 26.0–34.0)
MCHC: 31.9 g/dL (ref 30.0–36.0)
MCV: 79.6 fL — ABNORMAL LOW (ref 80.0–100.0)
PLATELETS: 256 10*3/uL (ref 150–400)
RBC: 3.93 MIL/uL (ref 3.87–5.11)
RDW: 17.4 % — ABNORMAL HIGH (ref 11.5–15.5)
WBC: 18.4 10*3/uL — ABNORMAL HIGH (ref 4.0–10.5)
nRBC: 0 % (ref 0.0–0.2)

## 2018-06-28 LAB — URIC ACID: Uric Acid, Serum: 3.8 mg/dL (ref 2.5–7.1)

## 2018-06-28 LAB — TSH: TSH: 3.394 u[IU]/mL (ref 0.350–4.500)

## 2018-06-28 MED ORDER — SODIUM CHLORIDE 1 G PO TABS
1.0000 g | ORAL_TABLET | Freq: Two times a day (BID) | ORAL | Status: DC
  Administered 2018-06-28 – 2018-07-01 (×6): 1 g via ORAL
  Filled 2018-06-28 (×7): qty 1

## 2018-06-28 NOTE — Progress Notes (Signed)
Date of Admission:  06/26/2018      ID: Heather Larson is a 85 y.o. female  Active Problems:   Recurrent cellulitis of lower extremity    Subjective: Doing better  Medications:  . acidophilus  1 capsule Oral Daily  . atorvastatin  10 mg Oral Daily  . brimonidine  1 drop Both Eyes TID  . calcium carbonate  600 mg of elemental calcium Oral BID WC  . dicyclomine  10 mg Oral TID AC & HS  . dorzolamide  1 drop Both Eyes TID  . ferrous sulfate  325 mg Oral Q breakfast  . fidaxomicin  200 mg Oral BID  . latanoprost  1 drop Both Eyes QHS  . metoprolol succinate  100 mg Oral Daily  . multivitamin with minerals  1 tablet Oral Daily  . nystatin cream   Topical BID  . Rivaroxaban  15 mg Oral Q supper  . vitamin C  500 mg Oral Daily    Objective: Vital signs in last 24 hours: Temp:  [97.7 F (36.5 C)-98.1 F (36.7 C)] 97.7 F (36.5 C) (02/28 0544) Pulse Rate:  [68-78] 78 (02/28 0829) Resp:  [16-18] 16 (02/28 0544) BP: (107-141)/(50-89) 128/74 (02/28 0829) SpO2:  [95 %-97 %] 95 % (02/28 0544)  PHYSICAL EXAM:  General: Alert, cooperative, no distress, appears stated age.  Extremities Left erythema and edema much improved    Lab Results Recent Labs    06/27/18 0336 06/28/18 0400  WBC 34.5* 18.4*  HGB 10.1* 10.0*  HCT 31.1* 31.3*  NA 125* 125*  K 3.4* 3.6  CL 95* 96*  CO2 21* 23  BUN 19 18  CREATININE 0.63 0.55   Liver Panel Recent Labs    06/26/18 1414  PROT 7.4  ALBUMIN 3.7  AST 32  ALT 19  ALKPHOS 71  BILITOT 1.1   Sedimentation Rate No results for input(s): ESRSEDRATE in the last 72 hours. C-Reactive Protein No results for input(s): CRP in the last 72 hours.  Microbiology: [facility] negative Studies/Results: US Venous Img Lower Unilateral Left  Result Date: 06/26/2018 CLINICAL DATA:  Left lower extremity pain and edema. Evaluate for DVT. EXAM: LEFT LOWER EXTREMITY VENOUS DOPPLER ULTRASOUND TECHNIQUE: Gray-scale sonography with graded compression, as  well as color Doppler and duplex ultrasound were performed to evaluate the lower extremity deep venous systems from the level of the common femoral vein and including the common femoral, femoral, profunda femoral, popliteal and calf veins including the posterior tibial, peroneal and gastrocnemius veins when visible. The superficial great saphenous vein was also interrogated. Spectral Doppler was utilized to evaluate flow at rest and with distal augmentation maneuvers in the common femoral, femoral and popliteal veins. COMPARISON:  None. FINDINGS: Contralateral Common Femoral Vein: Respiratory phasicity is normal and symmetric with the symptomatic side. No evidence of thrombus. Normal compressibility. Common Femoral Vein: No evidence of thrombus. Normal compressibility, respiratory phasicity and response to augmentation. Saphenofemoral Junction: No evidence of thrombus. Normal compressibility and flow on color Doppler imaging. Profunda Femoral Vein: No evidence of thrombus. Normal compressibility and flow on color Doppler imaging. Femoral Vein: No evidence of thrombus. Normal compressibility, respiratory phasicity and response to augmentation. Popliteal Vein: No evidence of thrombus. Normal compressibility, respiratory phasicity and response to augmentation. Calf Veins: Appear patent where imaged. Superficial Great Saphenous Vein: No evidence of thrombus. Normal compressibility. Venous Reflux:  None. Other Findings:  None. IMPRESSION: No evidence of DVT within the left lower extremity. Electronically Signed   By: Jenny Reichmann  Watts M.D.   On: 06/26/2018 16:40     Assessment/Plan: 85 year old female with history of bilateral edema of the legs with recurrent cellulitis and history of streptococcal group G bacteremia.  Recurrent Cellulitis of the left leg with severe leukocytosis . Risk for recurrent cellulitis is the venous edema and also interdigital dryness and split skin with macerated area just likely athlete's  foot. It is interesting that she develops  severe leukocytosis  There is no evidence of abscess on the left leg.   cdiff is negative She is on vancomycin IV and p.o. fidaxomicin. Neg blood culture.  ? __She will need compression bandage of the legs. Nystatin cream to the interdigital areas  This is her third episode of cellulitis in the last year along with bacteremia and sepsis and high WBC  she may benefit from suppressive oral antibiotic therapy with penicillin 500mg  BID for 3-6 months along with suppressive cdiff prevention therapy as per Dr.Vanga's recommendation.  On discharge ( if it happens this weekend to give pencillin 500mg  Q 6 for 3 days followed by 500mg  BID for 3 months- I will see her in my office in 6-8 weeks  Recurrent cdiff colitis needing FMT X 2  Atrial fibrillation on metoprolol and Xarelto  Discussed the management with the patient and her daughter and the care team  ID will follow her peripherally this weekend

## 2018-06-28 NOTE — Consult Note (Addendum)
Pharmacy Antibiotic Note  Heather Larson is a 85 y.o. female admitted on 06/26/2018 with sepsis/cellulitis  Pharmacy has been consulted for Vancomycin dosing.  Plan: Day 3 of ABx therapy Continue Vancomycin 750 mg IV Q 24 hrs. Goal AUC 400-550. Expected AUC: 531 SCr used: 0.8   Height: 5\' 2"  (157.5 cm) Weight: 130 lb 1.1 oz (59 kg) IBW/kg (Calculated) : 50.1  Temp (24hrs), Avg:97.9 F (36.6 C), Min:97.7 F (36.5 C), Max:98.1 F (36.7 C)  Recent Labs  Lab 06/26/18 1037 06/26/18 1414 06/26/18 1534 06/26/18 1820 06/27/18 0336 06/28/18 0400  WBC 47.5 Repeated and verified X2.* 48.0*  --   --  34.5* 18.4*  CREATININE  --  0.75  --   --  0.63 0.55  LATICACIDVEN  --  1.7 2.0* 1.3  --   --     Estimated Creatinine Clearance: 34.7 mL/min (by C-G formula based on SCr of 0.55 mg/dL).    No Known Allergies  Antimicrobials this admission: Vancomycin 2/26 >>   Dose adjustments this admission: N/A  Microbiology results: 2/26 BCx: NGTD  Thank you for allowing pharmacy to be a part of this patient's care.  Pearla Dubonnet, PharmD, BCPS Clinical Pharmacist 06/28/2018 8:04 AM

## 2018-06-28 NOTE — Progress Notes (Signed)
[facility] [place] NAME: Heather Larson    MR#:  [id number]  DATE OF BIRTH:  12-23-1923  SUBJECTIVE:  CHIEF COMPLAINT:   Chief Complaint  Patient presents with  . Cellulitis  . Abnormal Lab   -Doing well today.  Still has loose stools from the antibiotics -Cellulitis is significantly improving  REVIEW OF SYSTEMS:  Review of Systems  Constitutional: Negative for chills, fever and malaise/fatigue.  HENT: Negative for congestion, ear discharge, hearing loss and nosebleeds.   Respiratory: Negative for cough, shortness of breath and wheezing.   Cardiovascular: Positive for leg swelling. Negative for chest pain and palpitations.  Gastrointestinal: Positive for diarrhea and nausea. Negative for abdominal pain, constipation and vomiting.  Genitourinary: Negative for dysuria.  Neurological: Negative for dizziness, focal weakness, seizures, weakness and headaches.  Psychiatric/Behavioral: Negative for depression.    DRUG ALLERGIES:  No Known Allergies  VITALS:  Blood pressure 128/74, pulse 78, temperature 97.7 F (36.5 C), temperature source Oral, resp. rate 16, height 5\' 2"  (1.575 m), weight 59 kg, SpO2 95 %.  PHYSICAL EXAMINATION:  Physical Exam   GENERAL:  85 y.o.-year-old elderly patient lying in the bed with no acute distress.  EYES: Pupils equal, round, reactive to light and accommodation. No scleral icterus. Extraocular muscles intact.  HEENT: Head atraumatic, normocephalic. Oropharynx and nasopharynx clear.  NECK:  Supple, no jugular venous distention. No thyroid enlargement, no tenderness.  LUNGS: Normal breath sounds bilaterally, no wheezing, rales,rhonchi or crepitation. No use of accessory muscles of respiration. Decreased bibasilar breath sounds CARDIOVASCULAR: S1, S2 normal. No  rubs, or gallops.  3/6 systolic murmur is present ABDOMEN: Soft, nontender, nondistended. Bowel sounds present. No organomegaly or mass.    EXTREMITIES: No  cyanosis, or clubbing.  Much improved erythema today left leg.  Swelling is going down as well Palpable bilateral dorsalis pedis pulses NEUROLOGIC: Cranial nerves II through XII are intact. Muscle strength 5/5 in all extremities. Sensation intact. Gait not checked.  PSYCHIATRIC: The patient is alert and oriented x 3.  SKIN: No obvious rash, lesion, or ulcer.       LABORATORY PANEL:   CBC Recent Labs  Lab 06/28/18 0400  WBC 18.4*  HGB 10.0*  HCT 31.3*  PLT 256   ------------------------------------------------------------------------------------------------------------------  Chemistries  Recent Labs  Lab 06/26/18 1414  06/28/18 0400  NA 127*   < > 125*  K 3.9   < > 3.6  CL 94*   < > 96*  CO2 21*   < > 23  GLUCOSE 140*   < > 103*  BUN 16   < > 18  CREATININE 0.75   < > 0.55  CALCIUM 8.9   < > 8.3*  AST 32  --   --   ALT 19  --   --   ALKPHOS 71  --   --   BILITOT 1.1  --   --    < > = values in this interval not displayed.   ------------------------------------------------------------------------------------------------------------------  Cardiac Enzymes No results for input(s): TROPONINI in the last 168 hours. ------------------------------------------------------------------------------------------------------------------  RADIOLOGY:  US Venous Img Lower Unilateral Left  Result Date: 06/26/2018 CLINICAL DATA:  Left lower extremity pain and edema. Evaluate for DVT. EXAM: LEFT LOWER EXTREMITY VENOUS DOPPLER ULTRASOUND TECHNIQUE: Gray-scale sonography with graded compression, as well as color Doppler and duplex ultrasound were performed to evaluate the lower extremity deep venous systems from the level of the common femoral vein and including  the common femoral, femoral, profunda femoral, popliteal and calf veins including the posterior tibial, peroneal and gastrocnemius veins when visible. The superficial great saphenous vein was also  interrogated. Spectral Doppler was utilized to evaluate flow at rest and with distal augmentation maneuvers in the common femoral, femoral and popliteal veins. COMPARISON:  None. FINDINGS: Contralateral Common Femoral Vein: Respiratory phasicity is normal and symmetric with the symptomatic side. No evidence of thrombus. Normal compressibility. Common Femoral Vein: No evidence of thrombus. Normal compressibility, respiratory phasicity and response to augmentation. Saphenofemoral Junction: No evidence of thrombus. Normal compressibility and flow on color Doppler imaging. Profunda Femoral Vein: No evidence of thrombus. Normal compressibility and flow on color Doppler imaging. Femoral Vein: No evidence of thrombus. Normal compressibility, respiratory phasicity and response to augmentation. Popliteal Vein: No evidence of thrombus. Normal compressibility, respiratory phasicity and response to augmentation. Calf Veins: Appear patent where imaged. Superficial Great Saphenous Vein: No evidence of thrombus. Normal compressibility. Venous Reflux:  None. Other Findings:  None. IMPRESSION: No evidence of DVT within the left lower extremity. Electronically Signed   By: Sandi Mariscal M.D.   On: 06/26/2018 16:40    EKG:   Orders placed or performed during the hospital encounter of 06/26/18  . ED EKG  . ED EKG 12-Lead  . ED EKG  . ED EKG 12-Lead  . EKG 12-Lead  . EKG 12-Lead    ASSESSMENT AND PLAN:   85 year old female with past medical history significant for A. 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  Recurrent left lower extremity cellulitis-Dopplers negative for DVT -Prior history of MRSA infections-but also had recurrent Streptococcus bacteremia's -Currently on vancomycin.  -WBC is improving.-ID  consult appreciated -Keep the leg elevated -Continue IV vancomycin while in the hospital for another day or 2.  At discharge, per ID recommendations due to recurrent Streptococcus bacteremia's and cellulitis-plan to do long-term oral prophylactic antibiotics. -Please discharged on penicillin 500 mg 4 times daily for 3 days followed by twice daily for 3 to 6 months and ID will follow-up as outpatient  2.  Recurrent C. difficile history-status post second stool transplant about a month ago. -Now started on vancomycin and having loose stools.  Recheck C. difficile antigen is negative -Discussed with GI and ID.  Patient will be discharged on 5 days of Dificid twice daily followed by every other day Dificid and outpatient follow-up -Patient's daughter should have Dificid at home  3.  Hyponatremia-known history of SIADH -Chronically low sodium, currently at [facility] nephrology consult. -Started on salt tablets and fluid restriction  4.  A. fib-rate controlled.  On metoprolol.  Also on Xarelto for anticoagulation.  5.  hyperlipidemia-statin  Independent at baseline.  Ambulates with a cane. -Physical therapy consulted -Updated daughter at bedside   All the records are reviewed and case discussed with Care Management/Social Workerr. Management plans discussed with the patient, family and they are in agreement.  CODE STATUS: DNR  TOTAL TIME TAKING CARE OF THIS PATIENT: 39 minutes.   POSSIBLE D/C IN 1-2 DAYS, DEPENDING ON CLINICAL CONDITION.   Gladstone Lighter M.D on 06/28/2018 at 2:59 PM  Between 7am to 6pm - Pager - [phone]  After 6pm go to [email or web address] - Proofreader  [facility]  Office  [phone]  CC: Primary care physician; Pleas Koch, NP

## 2018-06-28 NOTE — Consult Note (Signed)
[facility] CONSULT NOTE    Date: 06/28/2018                  Patient Name:  Heather Larson  MRN: [id number]  DOB: [birth year]  Age / Sex: 85 y.o., female         PCP: Pleas Koch, NP                 Service Requesting Consult: Hospitalist                 Reason for Consult: Acute on chronic hyponatremia            History of Present Illness: Patient is a 85 y.o. female with a PMHx of atrial fibrillation on anticoagulation, bilateral lower extremity edema, history of breast cancer,congestive heart failure, diarrhea,hyperlipidemia, hypertension, hyponatremia, who was admitted to [facility] on 06/26/2018 for evaluation of cellulitis.  Patient had lower abdominal wall cellulitis noted at home and came in for evaluation.  It then also developed on her left lower extremity.  Patient also has history of hyponatremia.  Her sodium usually ranges between 130-134.  She states that she was previously seeing a physician in [place] regarding hyponatremia.  She was previously on a fluid restriction but has not been following 1 recently.   Medications: Outpatient medications: Medications Prior to Admission  Medication Sig Dispense Refill Last Dose  . acetaminophen (TYLENOL) 325 MG tablet Take 2 tablets (650 mg total) by mouth every 6 (six) hours as needed for mild pain (or Fever >/= 101).   06/26/2018 at PRN  . albuterol (PROVENTIL HFA;VENTOLIN HFA) 108 (90 Base) MCG/ACT inhaler Inhale 2 puffs into the lungs every 6 (six) hours as needed for wheezing or shortness of breath.   Unknown at PRN  . atorvastatin (LIPITOR) 10 MG tablet Take 1 tablet (10 mg total) by mouth daily. 90 tablet 3 06/26/2018 at 0800  . brimonidine (ALPHAGAN) 0.2 % ophthalmic solution Place 1 drop into both eyes 3 (three) times daily.  6 06/25/2018 at Unknown time  . calcium carbonate (OSCAL) 1500 (600 Ca) MG TABS tablet Take by mouth 2 (two) times daily with a meal.   06/26/2018 at 0800  . diphenhydrAMINE HCl,  Sleep, 25 MG CAPS Take 25-50 mg by mouth at bedtime as needed (sleep).   Unknown at PRN  . dorzolamide (TRUSOPT) 2 % ophthalmic solution Place 1 drop into both eyes 3 (three) times daily.  6 06/25/2018 at Unknown time  . ferrous sulfate 325 (65 FE) MG EC tablet Take 325 mg by mouth daily with breakfast.   06/26/2018 at 0800  . fidaxomicin (DIFICID) 200 MG TABS tablet Take 200 mg by mouth daily.    06/26/2018 at 1230  . furosemide (LASIX) 20 MG tablet TAKE 1 TABLET(20 MG) BY MOUTH EVERY OTHER DAY (Patient taking differently: Take 20 mg by mouth daily. ) 45 tablet 2 06/26/2018 at 0800  . metoprolol succinate (TOPROL-XL) 100 MG 24 hr tablet Take 1 tablet (100 mg total) by mouth daily. Take with or immediately following a meal. 90 tablet 3 06/26/2018 at 0800  . Multiple Vitamins-Minerals (MULTIVITAMIN ADULT) TABS Take 1 tablet by mouth daily.   06/26/2018 at 0800  . Nutritional Supplements (NUTRITIONAL SHAKE PLUS PROTEIN PO) Take by mouth.   Taking  . Rivaroxaban (XARELTO) 15 MG TABS tablet Take 1 tablet (15 mg total) by mouth daily. 90 tablet 3 06/26/2018 at 0800  . TRAVATAN Z 0.004 % SOLN ophthalmic solution Place 1  drop into both eyes at bedtime.   5 06/25/2018 at 2000  . vitamin C (ASCORBIC ACID) 500 MG tablet Take 500 mg by mouth daily.   06/26/2018 at 0800  . cephALEXin (KEFLEX) 500 MG capsule Take 1 capsule (500 mg total) by mouth 3 (three) times daily for 7 days. 21 capsule 0   . dicyclomine (BENTYL) 10 MG capsule Take 1 capsule (10 mg total) by mouth 4 (four) times daily -  before meals and at bedtime. (Patient not taking: Reported on 06/26/2018) 90 capsule 0 Not Taking at Unknown time  . HYDROcodone-acetaminophen (NORCO/VICODIN) 5-325 MG tablet Take 1 tablet by mouth every 6 (six) hours as needed for moderate pain. (Patient not taking: Reported on 06/26/2018) 20 tablet 0 Not Taking at Unknown time    Current medications: Current Facility-Administered Medications  Medication Dose Route Frequency Provider  Last Rate Last Dose  . acetaminophen (TYLENOL) tablet 650 mg  650 mg Oral Q6H PRN [name], MD      . acidophilus (RISAQUAD) capsule 1 capsule  1 capsule Oral Daily Gladstone Lighter, MD   1 capsule at 06/28/18 0829  . albuterol (PROVENTIL) (2.5 MG/3ML) 0.083% nebulizer solution 2.5 mg  2.5 mg Inhalation Q6H PRN [name], MD      . atorvastatin (LIPITOR) tablet 10 mg  10 mg Oral Daily [name], MD   10 mg at 06/27/18 1739  . brimonidine (ALPHAGAN) 0.2 % ophthalmic solution 1 drop  1 drop Both Eyes TID [name], MD   1 drop at 06/28/18 [phone]  . calcium carbonate (TUMS - dosed in mg elemental calcium) chewable tablet 600 mg of elemental calcium  600 mg of elemental calcium Oral BID WC [name], MD   600 mg of elemental calcium at 06/28/18 0829  . dicyclomine (BENTYL) capsule 10 mg  10 mg Oral TID AC & HS [name], MD   10 mg at 06/28/18 1312  . docusate sodium (COLACE) capsule 100 mg  100 mg Oral BID PRN [name], MD      . dorzolamide (TRUSOPT) 2 % ophthalmic solution 1 drop  1 drop Both Eyes TID [name], MD   1 drop at 06/28/18 0831  . ferrous sulfate tablet 325 mg  325 mg Oral Q breakfast [name], MD   325 mg at 06/28/18 0830  . fidaxomicin (DIFICID) tablet 200 mg  200 mg Oral BID Nena Polio, MD   200 mg at 06/28/18 0836  . HYDROcodone-acetaminophen (NORCO/VICODIN) 5-325 MG per tablet 1 tablet  1 tablet Oral Q6H PRN [name], MD      . latanoprost (XALATAN) 0.005 % ophthalmic solution 1 drop  1 drop Both Eyes QHS [name], MD   1 drop at 06/27/18 2227  . metoprolol succinate (TOPROL-XL) 24 hr tablet 100 mg  100 mg Oral Daily [name], MD   100 mg at 06/28/18 0829  . multivitamin with minerals tablet 1 tablet  1 tablet Oral Daily [name], MD   1 tablet at 06/28/18 1941  . nystatin cream (MYCOSTATIN)   Topical BID  Tsosie Billing, MD   1 application at 74/08/14 [phone]  . Rivaroxaban (XARELTO) tablet 15 mg  15 mg Oral Q supper [name], MD   15 mg at 06/27/18 1739  . vancomycin (VANCOCIN) IVPB 750 mg/150 ml premix  750 mg Intravenous Q24H Lu Duffel, [facility]   Stopped at 06/27/18 1857  . vitamin C (ASCORBIC ACID) tablet 500 mg  500 mg  Oral Daily [name], MD   500 mg at 06/28/18 0830      Allergies: No Known Allergies    Past Medical History: Past Medical History:  Diagnosis Date  . Anemia   . Atrial fibrillation, permanent    on Xarelto  . Bilateral lower extremity edema   . Bilateral lower leg cellulitis 11/11/2017  . Cancer [facility])    breast cancer at age 43 and skin  . Cellulitis    lower abdomen  . CHF (congestive heart failure) ([place])   . Diarrhea 01/11/2018  . Dysrhythmia    a-fib  . Hyperlipidemia   . Hypertension   . Hypertension   . Incontinence   . Low sodium levels   . Macular degeneration   . Malnutrition of moderate degree 11/23/2017     Past Surgical History: Past Surgical History:  Procedure Laterality Date  . BREAST SURGERY     right mastectomy  . COLONOSCOPY WITH PROPOFOL N/A 05/31/2018   Procedure: COLONOSCOPY WITH PROPOFOL;  Surgeon: Lin Landsman, MD;  Location: [facility] ENDOSCOPY;  Service: Gastroenterology;  Laterality: N/A;  . EYE SURGERY     x3  . 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 FECAL TRANSPLANT N/A 05/31/2018   Procedure: FECAL TRANSPLANT;  Surgeon: Lin Landsman, MD;  Location: [facility] ENDOSCOPY;  Service: Gastroenterology;  Laterality: N/A;  . IR RADIOLOGIST EVAL & MGMT  09/17/2017  . MASTECTOMY     right  . SHOULDER OPEN ROTATOR CUFF REPAIR     left  . SKIN BIOPSY     skin cancer     Family History: Family History  Problem Relation Age of Onset  . Hypertension  Mother      Social History: Social History   Socioeconomic History  . Marital status: Divorced    Spouse name: Not on file  . Number of children: Not on file  . Years of education: Not on file  . Highest education level: Not on file  Occupational History  . Occupation: retired  Scientific laboratory technician  . Financial resource strain: Not on file  . Food insecurity:    Worry: Not on file    Inability: Not on file  . Transportation needs:    Medical: Not on file    Non-medical: Not on file  Tobacco Use  . Smoking status: Never Smoker  . Smokeless tobacco: Never Used  Substance and Sexual Activity  . Alcohol use: Not Currently    Alcohol/week: 2.0 standard drinks    Types: 2 Shots of liquor per week    Comment: 2 drinks daily  . Drug use: No  . Sexual activity: Not Currently  Lifestyle  . Physical activity:    Days per week: Not on file    Minutes per session: Not on file  . Stress: Not on file  Relationships  . Social connections:    Talks on phone: Not on file    Gets together: Not on file    Attends religious service: Not on file    Active member of club or organization: Not on file    Attends meetings of clubs or organizations: Not on file    Relationship status: Not on file  . Intimate partner violence:    Fear of current or ex partner: Not on file    Emotionally abused: Not on file    Physically abused: Not  on file    Forced sexual activity: Not on file  Other Topics Concern  . Not on file  Social History Narrative   Moved from [place].     Review of Systems: Review of Systems  Constitutional: Positive for malaise/fatigue. Negative for chills and fever.  HENT: Negative for congestion, hearing loss and tinnitus.   Eyes: Negative for blurred vision and double vision.  Respiratory: Negative for cough, hemoptysis and sputum production.   Cardiovascular: Negative for chest pain and palpitations.  Gastrointestinal: Negative for heartburn, nausea and vomiting.   Genitourinary: Negative for dysuria and urgency.  Musculoskeletal: Negative for joint pain and myalgias.  Skin: Positive for rash.  Neurological: Positive for weakness. Negative for dizziness.  Endo/Heme/Allergies: Negative for polydipsia. Does not bruise/bleed easily.  Psychiatric/Behavioral: Negative for depression. The patient is not nervous/anxious.      Vital Signs: Blood pressure 128/74, pulse 78, temperature 97.7 F (36.5 C), temperature source Oral, resp. rate 16, height 5\' 2"  (1.575 m), weight 59 kg, SpO2 95 %.  Weight trends: Filed Weights   06/26/18 1410  Weight: 59 kg    Physical Exam: General: NAD, sitting up in chair  Head: Normocephalic, atraumatic.  Eyes: Anicteric  Nose: Mucous membranes moist, not inflammed, nonerythematous.  Throat: Oropharynx nonerythematous, no exudate appreciated.   Neck: Supple, trachea midline.  Lungs:  Normal respiratory effort. Clear to auscultation BL without crackles or wheezes.  Heart: RRR. S1 and S2 normal without gallop, murmur, or rubs.  Abdomen:  BS normoactive. Soft, Nondistended, non-tender.  No masses or organomegaly.  Extremities: LLE cellulitis and edema  Neurologic: A&O X3, Motor strength is 5/5 in the all 4 extremities  Skin: No visible rashes, scars.    Lab results: Basic Metabolic Panel: Recent Labs  Lab 06/26/18 1414 06/27/18 0336 06/28/18 0400  NA 127* 125* 125*  K 3.9 3.4* 3.6  CL 94* 95* 96*  CO2 21* 21* 23  GLUCOSE 140* 104* 103*  BUN 16 19 18   CREATININE 0.75 0.63 0.55  CALCIUM 8.9 8.3* 8.3*    Liver Function Tests: Recent Labs  Lab 06/26/18 1414  AST 32  ALT 19  ALKPHOS 71  BILITOT 1.1  PROT 7.4  ALBUMIN 3.7   No results for input(s): LIPASE, AMYLASE in the last 168 hours. No results for input(s): AMMONIA in the last 168 hours.  CBC: Recent Labs  Lab 06/26/18 1037 06/26/18 1414 06/27/18 0336 06/28/18 0400  WBC 47.5 Repeated and verified X2.* 48.0* 34.5* 18.4*  NEUTROABS 44.3*  43.6*  --   --   HGB 11.6* 11.3* 10.1* 10.0*  HCT 35.9* 34.9* 31.1* 31.3*  MCV 80.4 81.4 80.8 79.6*  PLT 331.0 299 247 256    Cardiac Enzymes: No results for input(s): CKTOTAL, CKMB, CKMBINDEX, TROPONINI in the last 168 hours.  BNP: Invalid input(s): POCBNP  CBG: No results for input(s): GLUCAP in the last 168 hours.  Microbiology: Results for orders placed or performed during the hospital encounter of 06/26/18  Blood Culture (routine x 2)     Status: None (Preliminary result)   Collection Time: 06/26/18  2:14 PM  Result Value Ref Range Status   Specimen Description BLOOD BLOOD LEFT ARM  Final   Special Requests   Final    BOTTLES DRAWN AEROBIC AND ANAEROBIC Blood Culture adequate volume   Culture   Final    NO GROWTH 2 DAYS Performed at [facility], [address] [place], [place] [address]    Report Status PENDING  Incomplete  Blood Culture (routine x 2)     Status: None (Preliminary result)   Collection Time: 06/26/18  3:34 PM  Result Value Ref Range Status   Specimen Description BLOOD BLOOD RIGHT HAND  Final   Special Requests   Final    BOTTLES DRAWN AEROBIC ONLY Blood Culture adequate volume   Culture   Final    NO GROWTH 2 DAYS Performed at [facility], [place], [address] [place], [place] [address]    Report Status PENDING  Incomplete  MRSA PCR Screening     Status: Abnormal   Collection Time: 06/27/18 12:30 AM  Result Value Ref Range Status   MRSA by PCR POSITIVE (A) NEGATIVE Final    Comment:        The GeneXpert MRSA Assay (FDA approved for NASAL specimens only), is one component of a comprehensive MRSA colonization surveillance program. It is not intended to diagnose MRSA infection nor to guide or monitor treatment for MRSA infections. RESULT CALLED TO, READ BACK BY AND VERIFIED WITH: [facility] 06/27/2018 0224 REC Performed at [facility], [address] [place], [place] [address]   C difficile quick scan w PCR  reflex     Status: None   Collection Time: 06/27/18  1:53 PM  Result Value Ref Range Status   C Diff antigen NEGATIVE NEGATIVE Final   C Diff toxin NEGATIVE NEGATIVE Final   C Diff interpretation No C. difficile detected.  Final    Comment: Performed at [facility], [address] [place], [place] [address]    Coagulation Studies: No results for input(s): LABPROT, INR in the last 72 hours.  Urinalysis: Recent Labs    06/27/18 0847  COLORURINE AMBER*  LABSPEC 1.021  PHURINE 5.0  GLUCOSEU NEGATIVE  HGBUR MODERATE*  BILIRUBINUR NEGATIVE  KETONESUR 5*  PROTEINUR 30*  NITRITE NEGATIVE  LEUKOCYTESUR MODERATE*      Imaging: US Venous Img Lower Unilateral Left  Result Date: 06/26/2018 CLINICAL DATA:  Left lower extremity pain and edema. Evaluate for DVT. EXAM: LEFT LOWER EXTREMITY VENOUS DOPPLER ULTRASOUND TECHNIQUE: Gray-scale sonography with graded compression, as well as color Doppler and duplex ultrasound were performed to evaluate the lower extremity deep venous systems from the level of the common femoral vein and including the common femoral, femoral, profunda femoral, popliteal and calf veins including the posterior tibial, peroneal and gastrocnemius veins when visible. The superficial great saphenous vein was also interrogated. Spectral Doppler was utilized to evaluate flow at rest and with distal augmentation maneuvers in the common femoral, femoral and popliteal veins. COMPARISON:  None. FINDINGS: Contralateral Common Femoral Vein: Respiratory phasicity is normal and symmetric with the symptomatic side. No evidence of thrombus. Normal compressibility. Common Femoral Vein: No evidence of thrombus. Normal compressibility, respiratory phasicity and response to augmentation. Saphenofemoral Junction: No evidence of thrombus. Normal compressibility and flow on color Doppler imaging. Profunda Femoral Vein: No evidence of thrombus. Normal compressibility and flow on color  Doppler imaging. Femoral Vein: No evidence of thrombus. Normal compressibility, respiratory phasicity and response to augmentation. Popliteal Vein: No evidence of thrombus. Normal compressibility, respiratory phasicity and response to augmentation. Calf Veins: Appear patent where imaged. Superficial Great Saphenous Vein: No evidence of thrombus. Normal compressibility. Venous Reflux:  None. Other Findings:  None. IMPRESSION: No evidence of DVT within the left lower extremity. Electronically Signed   By: Sandi Mariscal M.D.   On: 06/26/2018 16:40      Assessment & Plan: Pt is a 85  y.o. female with a PMHx of atrial fibrillation on anticoagulation, bilateral lower extremity edema, history of breast cancer,congestive heart failure, diarrhea,hyperlipidemia, hypertension, hyponatremia, who was admitted to [facility] on 06/26/2018 for evaluation of cellulitis.  1.  Hyponatremia, with prior hx of SIADH.  2.  Anemia unspecified. 3.  LLE cellulitis.  Plan:  We are asked to see the patient for evaluation management of hyponatremia.  She has known prior history of hyponatremia and was followed in [place] by a physician for this issue.  She was previously on salt tablets as well.  We will add sodium chloride 1 g by mouth twice a day.  In addition I've advised her to follow a 1200 mL fluid restriction. She verbalized understanding of this.  We may need to consider adding furosemide but hold off for now.  We also plan to check TSH and cortisol.  Thanks for consultation.

## 2018-06-28 NOTE — Evaluation (Signed)
Physical Therapy Evaluation Patient Details Name: Heather Larson MRN: [id number] DOB: [birth year] Today's Date: 06/28/2018   History of Present Illness  85 y.o. 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  She is admitted with cellulitis redness in the left leg along with pain, she has had chronic issues with recurrent LE cellulitis.  Clinical Impression  Pt willing to work with PT and ultimately did relatively well.  She did not need direct physical assist with mobility, transfers or with 70 ft of ambulation with walker. She reports that she is at/near her baseline and feels confident about returning back to [facility] once discharged.  She was very talkative and at times did show slight confusion, but all-in-all she was appropriate, alert and aware of the situation.  Pt having HHPT at [facility] and will benefit by continuing with this.    Follow Up Recommendations Home health PT(currently HHPT at [facility] and will benefit by continuing)    Equipment Recommendations  None recommended by PT    Recommendations for Other Services       Precautions / Restrictions Precautions Precautions: Fall Restrictions Weight Bearing Restrictions: No      Mobility  Bed Mobility Overal bed mobility: Independent             General bed mobility comments: Pt was able to get to EOB w/o direct phyiscal assist, and overall showed good confidence and safety  Transfers Overall transfer level: Modified independent Equipment used: Rolling walker (2 wheeled)             General transfer comment: Needed extra time to prep herself, insure she did not have dizziness but was able to rise w/o direct assist with light cuing for hand usage and  sequencing  Ambulation/Gait Ambulation/Gait assistance: Supervision Gait Distance (Feet): 70 Feet Assistive device: Rolling walker (2 wheeled)       General Gait Details: Pt was able to ambulate with good confidence and safety.  She was slow but not hesitant and reports being near her baseline  Financial trader [employer] (Stroke Patients Only)       Balance Overall balance assessment: Modified Independent(with walker she displayed good standing confidence)                                           Pertinent Vitals/Pain Pain Assessment: (c/o LLE soreness related to swelling/cellulitis)    Home Living Family/patient expects to be discharged to:: Assisted living               Home Equipment: Walker - 4 wheels Additional Comments: [facility] independent living;     Prior Function Level of Independence: Independent with assistive device(s)         Comments: did need assistance with bathing 2x a week; mod I for dressing and eating; used rollator for ambulation (down to dining area 3x/day)     Hand Dominance        Extremity/Trunk Assessment   Upper Extremity Assessment Upper Extremity Assessment: Generalized weakness(age appropriate limitations)    Lower Extremity Assessment Lower Extremity Assessment: Generalized weakness(age appropriate limitations)  Communication   Communication: No difficulties  Cognition Arousal/Alertness: Awake/alert Behavior During Therapy: WFL for tasks assessed/performed Overall Cognitive Status: Within Functional Limits for tasks assessed                                        General Comments      Exercises     Assessment/Plan    PT Assessment Patient needs continued PT services  PT Problem List Decreased strength;Decreased activity tolerance;Decreased mobility;Decreased knowledge of use of DME;Decreased safety awareness       PT  Treatment Interventions Gait training;Functional mobility training;Therapeutic activities;Therapeutic exercise;Balance training;Patient/family education;DME instruction    PT Goals (Current goals can be found in the Care Plan section)  Acute Rehab PT Goals Patient Stated Goal: figure out how to stop her chronic cellulitis PT Goal Formulation: With patient Time For Goal Achievement: 07/12/18 Potential to Achieve Goals: Good    Frequency Min 2X/week   Barriers to discharge        Co-evaluation               AM-PAC PT "6 Clicks" Mobility  Outcome Measure Help needed turning from your back to your side while in a flat bed without using bedrails?: None Help needed moving from lying on your back to sitting on the side of a flat bed without using bedrails?: None Help needed moving to and from a bed to a chair (including a wheelchair)?: None Help needed standing up from a chair using your arms (e.g., wheelchair or bedside chair)?: None Help needed to walk in hospital room?: None Help needed climbing 3-5 steps with a railing? : A Little 6 Click Score: 23    End of Session Equipment Utilized During Treatment: Gait belt Activity Tolerance: Patient tolerated treatment well Patient left: with chair alarm set;with call bell/phone within reach Nurse Communication: Mobility status PT Visit Diagnosis: Muscle weakness (generalized) (M62.81);Other abnormalities of gait and mobility (R26.89)    Time: 5790-3833 PT Time Calculation (min) (ACUTE ONLY): 28 min   Charges:   PT Evaluation $PT Eval Low Complexity: 1 Low          Kreg Shropshire, DPT 06/28/2018, 1:34 PM

## 2018-06-28 NOTE — Progress Notes (Signed)
   06/28/18 1100  Clinical Encounter Type  Visited With Patient  Visit Type Initial  Ch was rounding. Pt said that her daughter's minister came by yesterday and that she has good support.

## 2018-06-28 NOTE — Care Management (Addendum)
Spoke with [name], Pharmacy representative for [facility]. States she will try to get the Dificid through outpatient pharmacy in [place].  Will check with daughter [name].  Ms. Heather Larson has a full prescription of Dificid in the home. Will not need anymore at this time.  [name] RN MSN CCM Care Management [phone]

## 2018-06-29 LAB — BLOOD CULTURE ID PANEL (REFLEXED)
Acinetobacter baumannii: NOT DETECTED
CANDIDA KRUSEI: NOT DETECTED
Candida albicans: NOT DETECTED
Candida glabrata: NOT DETECTED
Candida parapsilosis: NOT DETECTED
Candida tropicalis: NOT DETECTED
Enterobacter cloacae complex: NOT DETECTED
Enterobacteriaceae species: NOT DETECTED
Enterococcus species: NOT DETECTED
Escherichia coli: NOT DETECTED
Haemophilus influenzae: NOT DETECTED
Klebsiella oxytoca: NOT DETECTED
Klebsiella pneumoniae: NOT DETECTED
Listeria monocytogenes: NOT DETECTED
Neisseria meningitidis: NOT DETECTED
PSEUDOMONAS AERUGINOSA: NOT DETECTED
Proteus species: NOT DETECTED
Serratia marcescens: NOT DETECTED
Staphylococcus aureus (BCID): NOT DETECTED
Staphylococcus species: NOT DETECTED
Streptococcus agalactiae: DETECTED — AB
Streptococcus pneumoniae: NOT DETECTED
Streptococcus pyogenes: NOT DETECTED
Streptococcus species: DETECTED — AB

## 2018-06-29 LAB — CBC
HCT: 33.6 % — ABNORMAL LOW (ref 36.0–46.0)
Hemoglobin: 10.8 g/dL — ABNORMAL LOW (ref 12.0–15.0)
MCH: 25.6 pg — AB (ref 26.0–34.0)
MCHC: 32.1 g/dL (ref 30.0–36.0)
MCV: 79.6 fL — AB (ref 80.0–100.0)
Platelets: 313 10*3/uL (ref 150–400)
RBC: 4.22 MIL/uL (ref 3.87–5.11)
RDW: 17.2 % — ABNORMAL HIGH (ref 11.5–15.5)
WBC: 10.1 10*3/uL (ref 4.0–10.5)
nRBC: 0 % (ref 0.0–0.2)

## 2018-06-29 LAB — BASIC METABOLIC PANEL
Anion gap: 7 (ref 5–15)
BUN: 16 mg/dL (ref 8–23)
CO2: 23 mmol/L (ref 22–32)
CREATININE: 0.52 mg/dL (ref 0.44–1.00)
Calcium: 8.6 mg/dL — ABNORMAL LOW (ref 8.9–10.3)
Chloride: 98 mmol/L (ref 98–111)
GFR calc Af Amer: >60 mL/min (ref >60)
GFR calc non Af Amer: >60 mL/min (ref >60)
Glucose, Bld: 105 mg/dL — ABNORMAL HIGH (ref 70–99)
Potassium: 3.8 mmol/L (ref 3.5–5.1)
Sodium: 128 mmol/L — ABNORMAL LOW (ref 135–145)

## 2018-06-29 LAB — CORTISOL: Cortisol, Plasma: 16 ug/dL

## 2018-06-29 NOTE — Progress Notes (Signed)
PHARMACY - PHYSICIAN COMMUNICATION CRITICAL VALUE ALERT - BLOOD CULTURE IDENTIFICATION (BCID)  Heather Larson is an 85 y.o. female who presented to [facility] on 06/26/2018 with a chief complaint of cellulitis w/ h/o recurrent strep bacteremia, MRSA +  Assessment: 2/ aerobic and anaerobic GPC BCID strep agalactiae  Name of physician (or Provider) Contacted: Pras sridharan  Current antibiotics: vancomycin 750 mg IV daily  Changes to prescribed antibiotics recommended:  Patient is on recommended antibiotics - No changes needed patient is being followed by ID who is aware of the recurrent strep bacteremia. There are plans for patient to be on OP suppressive abx w/ PCN.  Results for orders placed or performed during the hospital encounter of 06/26/18  Blood Culture ID Panel (Reflexed) (Collected: 06/26/2018  2:14 PM)  Result Value Ref Range   Enterococcus species NOT DETECTED NOT DETECTED   Listeria monocytogenes NOT DETECTED NOT DETECTED   Staphylococcus species NOT DETECTED NOT DETECTED   Staphylococcus aureus (BCID) NOT DETECTED NOT DETECTED   Streptococcus species DETECTED (A) NOT DETECTED   Streptococcus agalactiae DETECTED (A) NOT DETECTED   Streptococcus pneumoniae NOT DETECTED NOT DETECTED   Streptococcus pyogenes NOT DETECTED NOT DETECTED   Acinetobacter baumannii NOT DETECTED NOT DETECTED   Enterobacteriaceae species NOT DETECTED NOT DETECTED   Enterobacter cloacae complex NOT DETECTED NOT DETECTED   Escherichia coli NOT DETECTED NOT DETECTED   Klebsiella oxytoca NOT DETECTED NOT DETECTED   Klebsiella pneumoniae NOT DETECTED NOT DETECTED   Proteus species NOT DETECTED NOT DETECTED   Serratia marcescens NOT DETECTED NOT DETECTED   Haemophilus influenzae NOT DETECTED NOT DETECTED   Neisseria meningitidis NOT DETECTED NOT DETECTED   Pseudomonas aeruginosa NOT DETECTED NOT DETECTED   Candida albicans NOT DETECTED NOT DETECTED   Candida glabrata NOT DETECTED NOT DETECTED   Candida krusei NOT DETECTED NOT DETECTED   Candida parapsilosis NOT DETECTED NOT DETECTED   Candida tropicalis NOT DETECTED NOT DETECTED    Heather Larson 06/29/2018  6:40 AM

## 2018-06-29 NOTE — Plan of Care (Signed)
  Problem: Education: Goal: Knowledge of General Education information will improve Description Including pain rating scale, medication(s)/side effects and non-pharmacologic comfort measures Outcome: Progressing   Problem: Health Behavior/Discharge Planning: Goal: Ability to manage health-related needs will improve Outcome: Progressing   Problem: Clinical Measurements: Goal: Ability to maintain clinical measurements within normal limits will improve Outcome: Progressing   Problem: Nutrition: Goal: Adequate nutrition will be maintained Outcome: Progressing   Problem: Coping: Goal: Level of anxiety will decrease Outcome: Progressing   Problem: Elimination: Goal: Will not experience complications related to bowel motility Outcome: Progressing Goal: Will not experience complications related to urinary retention Outcome: Progressing   Problem: Pain Managment: Goal: General experience of comfort will improve Outcome: Progressing   Problem: Safety: Goal: Ability to remain free from injury will improve Outcome: Progressing   Problem: Skin Integrity: Goal: Risk for impaired skin integrity will decrease Outcome: Progressing   Problem: Skin Integrity: Goal: Risk for impaired skin integrity will decrease Outcome: Progressing

## 2018-06-29 NOTE — Progress Notes (Signed)
[facility]  ROUNDING NOTE   Subjective:   Daughter at bedside.   Na 128  Objective:  Vital signs in last 24 hours:  Temp:  [97.6 F (36.4 C)-97.8 F (36.6 C)] 97.8 F (36.6 C) (02/29 0500) Pulse Rate:  [54-72] 72 (02/29 0500) Resp:  [16] 16 (02/29 0500) BP: (127-148)/(60-68) 148/68 (02/29 0500) SpO2:  [97 %-98 %] 97 % (02/29 0500)  Weight change:  Filed Weights   06/26/18 1410  Weight: 59 kg    Intake/Output: I/O last 3 completed shifts: In: -  Out: 1600 [Urine:1600]   Intake/Output this shift:  No intake/output data recorded.  Physical Exam: General: NAD, elderly, laying in bed  Head: Normocephalic, atraumatic. Dry oral mucosal membranes  Eyes: Anicteric, PERRL  Neck: Supple, trachea midline  Lungs:  Clear to auscultation  Heart: irregular  Abdomen:  Soft, nontender,   Extremities: + peripheral edema.  Neurologic: Nonfocal, moving all four extremities  Skin: No lesions        Basic Metabolic Panel: Recent Labs  Lab 06/26/18 1414 06/27/18 0336 06/28/18 0400 06/29/18 0518  NA 127* 125* 125* 128*  K 3.9 3.4* 3.6 3.8  CL 94* 95* 96* 98  CO2 21* 21* 23 23  GLUCOSE 140* 104* 103* 105*  BUN 16 19 18 16   CREATININE 0.75 0.63 0.55 0.52  CALCIUM 8.9 8.3* 8.3* 8.6*    Liver Function Tests: Recent Labs  Lab 06/26/18 1414  AST 32  ALT 19  ALKPHOS 71  BILITOT 1.1  PROT 7.4  ALBUMIN 3.7   No results for input(s): LIPASE, AMYLASE in the last 168 hours. No results for input(s): AMMONIA in the last 168 hours.  CBC: Recent Labs  Lab 06/26/18 1037 06/26/18 1414 06/27/18 0336 06/28/18 0400 06/29/18 0518  WBC 47.5 Repeated and verified X2.* 48.0* 34.5* 18.4* 10.1  NEUTROABS 44.3* 43.6*  --   --   --   HGB 11.6* 11.3* 10.1* 10.0* 10.8*  HCT 35.9* 34.9* 31.1* 31.3* 33.6*  MCV 80.4 81.4 80.8 79.6* 79.6*  PLT 331.0 299 247 256 313    Cardiac Enzymes: No results for input(s): CKTOTAL, CKMB, CKMBINDEX, TROPONINI in the last 168  hours.  BNP: Invalid input(s): POCBNP  CBG: No results for input(s): GLUCAP in the last 168 hours.  Microbiology: Results for orders placed or performed during the hospital encounter of 06/26/18  Blood Culture (routine x 2)     Status: None (Preliminary result)   Collection Time: 06/26/18  2:14 PM  Result Value Ref Range Status   Specimen Description BLOOD BLOOD LEFT ARM  Final   Special Requests   Final    BOTTLES DRAWN AEROBIC AND ANAEROBIC Blood Culture adequate volume   Culture  Setup Time   Final    Organism ID to follow AEROBIC BOTTLE ONLY GRAM POSITIVE COCCI IN BOTH AEROBIC AND ANAEROBIC BOTTLES CRITICAL RESULT CALLED TO, READ BACK BY AND VERIFIED WITH: DAVID BESANTI AT 0626 ON 06/29/2018 JJB Performed at [facility], [address] [place], [place] [address]    Culture GRAM POSITIVE COCCI  Final   Report Status PENDING  Incomplete  Blood Culture ID Panel (Reflexed)     Status: Abnormal   Collection Time: 06/26/18  2:14 PM  Result Value Ref Range Status   Enterococcus species NOT DETECTED NOT DETECTED Final   Listeria monocytogenes NOT DETECTED NOT DETECTED Final   Staphylococcus species NOT DETECTED NOT DETECTED Final   Staphylococcus aureus (BCID) NOT DETECTED NOT DETECTED Final  Streptococcus species DETECTED (A) NOT DETECTED Final    Comment: CRITICAL RESULT CALLED TO, READ BACK BY AND VERIFIED WITH: DAVID BESANTI AT 5784 ON 06/29/2018 JJB    Streptococcus agalactiae DETECTED (A) NOT DETECTED Final    Comment: CRITICAL RESULT CALLED TO, READ BACK BY AND VERIFIED WITH: DAVID BESANTI AT 10/25/2018 JJB    Streptococcus pneumoniae NOT DETECTED NOT DETECTED Final   Streptococcus pyogenes NOT DETECTED NOT DETECTED Final   Acinetobacter baumannii NOT DETECTED NOT DETECTED Final   Enterobacteriaceae species NOT DETECTED NOT DETECTED Final   Enterobacter cloacae complex NOT DETECTED NOT DETECTED Final   Escherichia coli NOT DETECTED NOT DETECTED Final    Klebsiella oxytoca NOT DETECTED NOT DETECTED Final   Klebsiella pneumoniae NOT DETECTED NOT DETECTED Final   Proteus species NOT DETECTED NOT DETECTED Final   Serratia marcescens NOT DETECTED NOT DETECTED Final   Haemophilus influenzae NOT DETECTED NOT DETECTED Final   Neisseria meningitidis NOT DETECTED NOT DETECTED Final   Pseudomonas aeruginosa NOT DETECTED NOT DETECTED Final   Candida albicans NOT DETECTED NOT DETECTED Final   Candida glabrata NOT DETECTED NOT DETECTED Final   Candida krusei NOT DETECTED NOT DETECTED Final   Candida parapsilosis NOT DETECTED NOT DETECTED Final   Candida tropicalis NOT DETECTED NOT DETECTED Final    Comment: Performed at [facility]: [address] [place], [place] [address]  Blood Culture (routine x 2)     Status: None (Preliminary result)   Collection Time: 06/26/18  3:34 PM  Result Value Ref Range Status   Specimen Description BLOOD BLOOD RIGHT HAND  Final   Special Requests   Final    BOTTLES DRAWN AEROBIC ONLY Blood Culture adequate volume   Culture   Final    NO GROWTH 3 DAYS Performed at [facility], [address] [place], [place] [address]    Report Status PENDING  Incomplete  MRSA PCR Screening     Status: Abnormal   Collection Time: 06/27/18 12:30 AM  Result Value Ref Range Status   MRSA by PCR POSITIVE (A) NEGATIVE Final    Comment:        The GeneXpert MRSA Assay (FDA approved for NASAL specimens only), is one component of a comprehensive MRSA colonization surveillance program. It is not intended to diagnose MRSA infection nor to guide or monitor treatment for MRSA infections. RESULT CALLED TO, READ BACK BY AND VERIFIED WITH: [facility] 06/27/2018 0224 REC Performed at [facility], [address] [place], [place] [address]   C difficile quick scan w PCR reflex     Status: None   Collection Time: 06/27/18  1:53 PM  Result Value Ref Range Status   C Diff antigen NEGATIVE NEGATIVE Final    C Diff toxin NEGATIVE NEGATIVE Final   C Diff interpretation No C. difficile detected.  Final    Comment: Performed at [facility], [address] [place], [place] [address]    Coagulation Studies: No results for input(s): LABPROT, INR in the last 72 hours.  Urinalysis: Recent Labs    06/27/18 0847  COLORURINE AMBER*  LABSPEC 1.021  PHURINE 5.0  GLUCOSEU NEGATIVE  HGBUR MODERATE*  BILIRUBINUR NEGATIVE  KETONESUR 5*  PROTEINUR 30*  NITRITE NEGATIVE  LEUKOCYTESUR MODERATE*      Imaging: No results found.   Medications:   . vancomycin 750 mg (06/28/18 1731)   . acidophilus  1 capsule Oral Daily  . atorvastatin  10 mg Oral Daily  .  brimonidine  1 drop Both Eyes TID  . calcium carbonate  600 mg of elemental calcium Oral BID WC  . dicyclomine  10 mg Oral TID AC & HS  . dorzolamide  1 drop Both Eyes TID  . ferrous sulfate  325 mg Oral Q breakfast  . fidaxomicin  200 mg Oral BID  . latanoprost  1 drop Both Eyes QHS  . metoprolol succinate  100 mg Oral Daily  . multivitamin with minerals  1 tablet Oral Daily  . nystatin cream   Topical BID  . Rivaroxaban  15 mg Oral Q supper  . sodium chloride  1 g Oral BID WC  . vitamin C  500 mg Oral Daily   acetaminophen, albuterol, docusate sodium, HYDROcodone-acetaminophen  Assessment/ Plan:  Ms. Heather Larson is a 85 y.o. white  female with atrial fibrillation on anticoagulation, bilateral lower extremity edema, history of breast cancer,congestive heart failure, diarrhea,hyperlipidemia, hypertension, hyponatremia, who was admitted to [facility] on 06/26/2018 for cellulitis.  1.  Hyponatremia, with prior hx of SIADH.  2.  Anemia  . 3.  LLE cellulitis.  4. C. Diff colitis  Plan:   - Fluid restriction - Sodium chloride 1g PO bid - Monitor serial sodium levels.    LOS: 3 [place] 2/29/20203:25 PM

## 2018-06-29 NOTE — Progress Notes (Addendum)
Heather Larson at Heather Larson NAME: Heather Larson    MR#:  [id number]  DATE OF BIRTH:  [birth year]  SUBJECTIVE:  CHIEF COMPLAINT:   Patient is feeling better, no complaints, gastroenterology to see, infectious disease input appreciated   REVIEW OF SYSTEMS:  Review of Systems  Constitutional: Negative for chills, fever and malaise/fatigue.  HENT: Negative for congestion, ear discharge, hearing loss and nosebleeds.   Respiratory: Negative for cough, shortness of breath and wheezing.   Cardiovascular: Positive for leg swelling. Negative for chest pain and palpitations.  Gastrointestinal: Positive for diarrhea and nausea. Negative for abdominal pain, constipation and vomiting.  Genitourinary: Negative for dysuria.  Neurological: Negative for dizziness, focal weakness, seizures, weakness and headaches.  Psychiatric/Behavioral: Negative for depression.    DRUG ALLERGIES:  No Known Allergies  VITALS:  Blood pressure (!) 148/68, pulse 72, temperature 97.8 F (36.6 C), temperature source Oral, resp. rate 16, height 5\' 2"  (1.575 m), weight 59 kg, SpO2 97 %.  PHYSICAL EXAMINATION:  Physical Exam   GENERAL:  85 y.o.-year-old elderly patient lying in the bed with no acute distress.  EYES: Pupils equal, round, reactive to light and accommodation. No scleral icterus. Extraocular muscles intact.  HEENT: Head atraumatic, normocephalic. Oropharynx and nasopharynx clear.  NECK:  Supple, no jugular venous distention. No thyroid enlargement, no tenderness.  LUNGS: Normal breath sounds bilaterally, no wheezing, rales,rhonchi or crepitation. No use of accessory muscles of respiration. Decreased bibasilar breath sounds CARDIOVASCULAR: S1, S2 normal. No  rubs, or gallops.  3/6 systolic murmur is present ABDOMEN: Soft, nontender, nondistended. Bowel sounds present. No organomegaly or mass.  EXTREMITIES: No  cyanosis, or clubbing.  Much improved erythema today left  leg.  Swelling is going down as well Palpable bilateral dorsalis pedis pulses NEUROLOGIC: Cranial nerves II through XII are intact. Muscle strength 5/5 in all extremities. Sensation intact. Gait not checked.  PSYCHIATRIC: The patient is alert and oriented x 3.  SKIN: No obvious rash, lesion, or ulcer.       LABORATORY PANEL:   CBC Recent Labs  Lab 06/29/18 0518  WBC 10.1  HGB 10.8*  HCT 33.6*  PLT 313   ------------------------------------------------------------------------------------------------------------------  Chemistries  Recent Labs  Lab 06/26/18 1414  06/29/18 0518  NA 127*   < > 128*  K 3.9   < > 3.8  CL 94*   < > 98  CO2 21*   < > 23  GLUCOSE 140*   < > 105*  BUN 16   < > 16  CREATININE 0.75   < > 0.52  CALCIUM 8.9   < > 8.6*  AST 32  --   --   ALT 19  --   --   ALKPHOS 71  --   --   BILITOT 1.1  --   --    < > = values in this interval not displayed.   ------------------------------------------------------------------------------------------------------------------  Cardiac Enzymes No results for input(s): TROPONINI in the last 168 hours. ------------------------------------------------------------------------------------------------------------------  RADIOLOGY:  No results found.  EKG:   Orders placed or performed during the hospital encounter of 06/26/18  . ED EKG  . ED EKG 12-Lead  . ED EKG  . ED EKG 12-Lead  . EKG 12-Lead  . EKG 12-Lead    ASSESSMENT AND PLAN:  85 year old female with past medical history significant for A. 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  -WBC is improving.-ID consult appreciated -Keep the leg elevated -Continue IV vancomycin while in the hospital for another day or 2.  At discharge, per ID recommendations due to recurrent Streptococcus bacteremia's and cellulitis-plan to do long-term oral prophylactic antibiotics. -Please discharged on penicillin 500 mg 4 times daily for 3 days followed by twice daily for 3 to 6 months and ID will follow-up as outpatient  *Recurrent C. difficile history-status post second stool transplant about a month ago. -Now started on vancomycin and having loose stools.  Recheck C. difficile antigen is negative -Discussed with GI and ID.  Patient will be discharged on 5 days of Dificid twice daily followed by every other day Dificid and outpatient follow-up -Patient's daughter should have Dificid at home  *Hyponatremia-known history of SIADH -Chronically low sodium, currently at [place] nephrology consult. -Started on salt tablets and fluid restriction  *A. fib-rate controlled.  On metoprolol.  Also on Xarelto for anticoagulation.  *hyperlipidemia-statin  Independent at baseline.  Ambulates with a cane. Physical therapy consulted Updated daughter at bedside   All the records are reviewed and case discussed with Care Management/Social Workerr. Management plans discussed with the patient, family and they are in agreement.  CODE STATUS: DNR  TOTAL TIME TAKING CARE OF THIS PATIENT: 35 minutes.   POSSIBLE D/C IN 1-2 DAYS, DEPENDING ON CLINICAL CONDITION.   Heather Larson M.D on 06/29/2018 at 12:36 PM  Between 7am to 6pm - Pager - [phone]  After 6pm go to [email or web address] - Proofreader  [facility]  Office  [phone]  CC: Primary care physician; Pleas Koch, NP

## 2018-06-30 NOTE — Progress Notes (Addendum)
[place] at [place] NAME: Heather Larson    MR#:  [id number]  DATE OF BIRTH:  1923/12/10  SUBJECTIVE:  CHIEF COMPLAINT:   Patient is feeling better, no complaints, gastroenterology/infectious disease/nephrology input appreciated, patient is not interested in wearing TED hose   REVIEW OF SYSTEMS:  Review of Systems  Constitutional: Negative for chills, fever and malaise/fatigue.  HENT: Negative for congestion, ear discharge, hearing loss and nosebleeds.   Respiratory: Negative for cough, shortness of breath and wheezing.   Cardiovascular: Positive for leg swelling. Negative for chest pain and palpitations.  Gastrointestinal: Positive for diarrhea and nausea. Negative for abdominal pain, constipation and vomiting.  Genitourinary: Negative for dysuria.  Neurological: Negative for dizziness, focal weakness, seizures, weakness and headaches.  Psychiatric/Behavioral: Negative for depression.    DRUG ALLERGIES:  No Known Allergies  VITALS:  Blood pressure (!) 172/68, pulse 67, temperature 97.6 F (36.4 C), temperature source Oral, resp. rate 15, height 5\' 2"  (1.575 m), weight 59 kg, SpO2 99 %.  PHYSICAL EXAMINATION:  Physical Exam   GENERAL:  85 y.o.-year-old elderly patient lying in the bed with no acute distress.  EYES: Pupils equal, round, reactive to light and accommodation. No scleral icterus. Extraocular muscles intact.  HEENT: Head atraumatic, normocephalic. Oropharynx and nasopharynx clear.  NECK:  Supple, no jugular venous distention. No thyroid enlargement, no tenderness.  LUNGS: Normal breath sounds bilaterally, no wheezing, rales,rhonchi or crepitation. No use of accessory muscles of respiration. Decreased bibasilar breath sounds CARDIOVASCULAR: S1, S2 normal. No  rubs, or gallops.  3/6 systolic murmur is present ABDOMEN: Soft, nontender, nondistended. Bowel sounds present. No organomegaly or mass.  EXTREMITIES: No  cyanosis,  or clubbing.  Much improved erythema today left leg.  Swelling is going down as well Palpable bilateral dorsalis pedis pulses NEUROLOGIC: Cranial nerves II through XII are intact. Muscle strength 5/5 in all extremities. Sensation intact. Gait not checked.  PSYCHIATRIC: The patient is alert and oriented x 3.  SKIN: No obvious rash, lesion, or ulcer.       LABORATORY PANEL:   CBC Recent Labs  Lab 06/29/18 0518  WBC 10.1  HGB 10.8*  HCT 33.6*  PLT 313   ------------------------------------------------------------------------------------------------------------------  Chemistries  Recent Labs  Lab 06/26/18 1414  06/29/18 0518  NA 127*   < > 128*  K 3.9   < > 3.8  CL 94*   < > 98  CO2 21*   < > 23  GLUCOSE 140*   < > 105*  BUN 16   < > 16  CREATININE 0.75   < > 0.52  CALCIUM 8.9   < > 8.6*  AST 32  --   --   ALT 19  --   --   ALKPHOS 71  --   --   BILITOT 1.1  --   --    < > = values in this interval not displayed.   ------------------------------------------------------------------------------------------------------------------  Cardiac Enzymes No results for input(s): TROPONINI in the last 168 hours. ------------------------------------------------------------------------------------------------------------------  RADIOLOGY:  No results found.  EKG:   Orders placed or performed during the hospital encounter of 06/26/18  . ED EKG  . ED EKG 12-Lead  . ED EKG  . ED EKG 12-Lead  . EKG 12-Lead  . EKG 12-Lead    ASSESSMENT AND PLAN:  85 year old female with past medical history significant for A. fib on Xarelto, bilateral lower extremity edema secondary to venous insufficiency, recurrent C. difficile status  post stool transplant, CHF presents to hospital secondary to worsening left lower extremity erythema and elevated white count as outpatient  *Acute recurrent extensive left lower extremity cellulitis Resolving Dopplers negative for DVT Prior history of  MRSA infections-but also had recurrent Streptococcus bacteremia's Currently on vancomycin.  ID input appreciated Keep the leg elevated Continue IV vancomycin, at discharge, per ID recommendations due to recurrent Streptococcus bacteremia's and cellulitis-plan to do long-term oral prophylactic antibiotics. Please discharged on penicillin 500 mg 4 times daily for 3 days followed by twice daily for 3 to 6 months and ID will follow-up as outpatient  *Acute recurrent C. difficile history status post second stool transplant about a month ago. Now started on vancomycin and having loose stools, C. difficile antigen was negative Discussed with GI and ID - will be discharged on 5 days of Dificid twice daily followed by every other day Dificid and outpatient follow-up Patient's daughter should have Dificid at home  *Chronic SIADH Improving Continue salt tabs 3 times daily with fluid restriction  *Chronic A. fib-rate controlled Stable on metoprolol, Xarelto  *Chronic hyperlipidemia, unspecified Continue statin therapy  *Independent at baseline Ambulates with a cane. Physical therapy consulted while in house  Disposition Home in 1 to 2 days barring any complications  All the records are reviewed and case discussed with Care Management/Social Workerr. Management plans discussed with the patient, family and they are in agreement.  CODE STATUS: DNR  TOTAL TIME TAKING CARE OF THIS PATIENT: 35 minutes.   POSSIBLE D/C IN 1-2 DAYS, DEPENDING ON CLINICAL CONDITION.   Avel Peace Alexzandria Massman M.D on 06/30/2018 at 11:32 AM  Between 7am to 6pm - Pager - [phone]  After 6pm go to [email or web address] - Proofreader  [facility]  Office  [phone]  CC: Primary care physician; Pleas Koch, NP

## 2018-06-30 NOTE — Progress Notes (Signed)
[facility]  ROUNDING NOTE   Subjective:   No new sodium today.   Daughter at bedside.   Objective:  Vital signs in last 24 hours:  Temp:  [97.4 F (36.3 C)-98.2 F (36.8 C)] 97.4 F (36.3 C) (03/01 1148) Pulse Rate:  [57-84] 84 (03/01 1148) Resp:  [14-16] 14 (03/01 1148) BP: (117-172)/(53-73) 148/73 (03/01 1148) SpO2:  [97 %-99 %] 98 % (03/01 1148)  Weight change:  Filed Weights   06/26/18 1410  Weight: 59 kg    Intake/Output: I/O last 3 completed shifts: In: 305.5 [IV Piggyback:305.5] Out: 2850 [Urine:2850]   Intake/Output this shift:  No intake/output data recorded.  Physical Exam: General: NAD, elderly, laying in bed  Head: Normocephalic, atraumatic. Dry oral mucosal membranes  Eyes: Anicteric, PERRL  Neck: Supple, trachea midline  Lungs:  Clear to auscultation  Heart: irregular  Abdomen:  Soft, nontender,   Extremities: + peripheral edema.  Neurologic: Nonfocal, moving all four extremities  Skin: No lesions        Basic Metabolic Panel: Recent Labs  Lab 06/26/18 1414 06/27/18 0336 06/28/18 0400 06/29/18 0518  NA 127* 125* 125* 128*  K 3.9 3.4* 3.6 3.8  CL 94* 95* 96* 98  CO2 21* 21* 23 23  GLUCOSE 140* 104* 103* 105*  BUN 16 19 18 16   CREATININE 0.75 0.63 0.55 0.52  CALCIUM 8.9 8.3* 8.3* 8.6*    Liver Function Tests: Recent Labs  Lab 06/26/18 1414  AST 32  ALT 19  ALKPHOS 71  BILITOT 1.1  PROT 7.4  ALBUMIN 3.7   No results for input(s): LIPASE, AMYLASE in the last 168 hours. No results for input(s): AMMONIA in the last 168 hours.  CBC: Recent Labs  Lab 06/26/18 1037 06/26/18 1414 06/27/18 0336 06/28/18 0400 06/29/18 0518  WBC 47.5 Repeated and verified X2.* 48.0* 34.5* 18.4* 10.1  NEUTROABS 44.3* 43.6*  --   --   --   HGB 11.6* 11.3* 10.1* 10.0* 10.8*  HCT 35.9* 34.9* 31.1* 31.3* 33.6*  MCV 80.4 81.4 80.8 79.6* 79.6*  PLT 331.0 299 247 256 313    Cardiac Enzymes: No results for input(s): CKTOTAL, CKMB,  CKMBINDEX, TROPONINI in the last 168 hours.  BNP: Invalid input(s): POCBNP  CBG: No results for input(s): GLUCAP in the last 168 hours.  Microbiology: Results for orders placed or performed during the hospital encounter of 06/26/18  Blood Culture (routine x 2)     Status: Abnormal (Preliminary result)   Collection Time: 06/26/18  2:14 PM  Result Value Ref Range Status   Specimen Description   Final    BLOOD BLOOD LEFT ARM Performed at [facility], [address], [place], [place] [address]    Special Requests   Final    BOTTLES DRAWN AEROBIC AND ANAEROBIC Blood Culture adequate volume Performed at [facility], [address], [place], [place] [address]    Culture  Setup Time   Final    AEROBIC BOTTLE ONLY GRAM POSITIVE COCCI IN BOTH AEROBIC AND ANAEROBIC BOTTLES CRITICAL RESULT CALLED TO, READ BACK BY AND VERIFIED WITH: Am[place] AT 8250 ON 06/29/2018 [place] Performed at [facility], [place] [address], [place], [place] [address]    Culture GROUP B STREP(S.AGALACTIAE)ISOLATED (A)  Final   Report Status PENDING  Incomplete  Blood Culture ID Panel (Reflexed)     Status: Abnormal   Collection Time: 06/26/18  2:14 PM  Result Value Ref Range Status   Enterococcus species NOT  DETECTED NOT DETECTED Final   Listeria monocytogenes NOT DETECTED NOT DETECTED Final   Staphylococcus species NOT DETECTED NOT DETECTED Final   Staphylococcus aureus (BCID) NOT DETECTED NOT DETECTED Final   Streptococcus species DETECTED (A) NOT DETECTED Final    Comment: CRITICAL RESULT CALLED TO, READ BACK BY AND VERIFIED WITH: DAVID BESANTI AT 3903 ON 06/29/2018 JJB    Streptococcus agalactiae DETECTED (A) NOT DETECTED Final    Comment: CRITICAL RESULT CALLED TO, READ BACK BY AND VERIFIED WITH: DAVID BESANTI AT 10/25/2018 JJB    Streptococcus pneumoniae NOT DETECTED NOT DETECTED Final   Streptococcus pyogenes NOT DETECTED NOT DETECTED Final   Acinetobacter baumannii NOT  DETECTED NOT DETECTED Final   Enterobacteriaceae species NOT DETECTED NOT DETECTED Final   Enterobacter cloacae complex NOT DETECTED NOT DETECTED Final   Escherichia coli NOT DETECTED NOT DETECTED Final   Klebsiella oxytoca NOT DETECTED NOT DETECTED Final   Klebsiella pneumoniae NOT DETECTED NOT DETECTED Final   Proteus species NOT DETECTED NOT DETECTED Final   Serratia marcescens NOT DETECTED NOT DETECTED Final   Haemophilus influenzae NOT DETECTED NOT DETECTED Final   Neisseria meningitidis NOT DETECTED NOT DETECTED Final   Pseudomonas aeruginosa NOT DETECTED NOT DETECTED Final   Candida albicans NOT DETECTED NOT DETECTED Final   Candida glabrata NOT DETECTED NOT DETECTED Final   Candida krusei NOT DETECTED NOT DETECTED Final   Candida parapsilosis NOT DETECTED NOT DETECTED Final   Candida tropicalis NOT DETECTED NOT DETECTED Final    Comment: Performed at [facility], [address] [place], [place] [address]  Blood Culture (routine x 2)     Status: None (Preliminary result)   Collection Time: 06/26/18  3:34 PM  Result Value Ref Range Status   Specimen Description BLOOD BLOOD RIGHT HAND  Final   Special Requests   Final    BOTTLES DRAWN AEROBIC ONLY Blood Culture adequate volume   Culture   Final    NO GROWTH 4 DAYS Performed at [facility], [address] [place], [place] [address]    Report Status PENDING  Incomplete  MRSA PCR Screening     Status: Abnormal   Collection Time: 06/27/18 12:30 AM  Result Value Ref Range Status   MRSA by PCR POSITIVE (A) NEGATIVE Final    Comment:        The GeneXpert MRSA Assay (FDA approved for NASAL specimens only), is one component of a comprehensive MRSA colonization surveillance program. It is not intended to diagnose MRSA infection nor to guide or monitor treatment for MRSA infections. RESULT CALLED TO, READ BACK BY AND VERIFIED WITH: [facility] 06/27/2018 0224 REC Performed at [facility], [address] [place], [place] [address]   C difficile quick scan w PCR reflex     Status: None   Collection Time: 06/27/18  1:53 PM  Result Value Ref Range Status   C Diff antigen NEGATIVE NEGATIVE Final   C Diff toxin NEGATIVE NEGATIVE Final   C Diff interpretation No C. difficile detected.  Final    Comment: Performed at [facility], [address] [place], [place] [address]    Coagulation Studies: No results for input(s): LABPROT, INR in the last 72 hours.  Urinalysis: No results for input(s): COLORURINE, LABSPEC, PHURINE, GLUCOSEU, HGBUR, BILIRUBINUR, KETONESUR, PROTEINUR, UROBILINOGEN, NITRITE, LEUKOCYTESUR in the last 72 hours.  Invalid input(s): APPERANCEUR    Imaging: No results found.   Medications:   . vancomycin Stopped (06/29/18 1741)   .  acidophilus  1 capsule Oral Daily  . atorvastatin  10 mg Oral Daily  . brimonidine  1 drop Both Eyes TID  . calcium carbonate  600 mg of elemental calcium Oral BID WC  . dicyclomine  10 mg Oral TID AC & HS  . dorzolamide  1 drop Both Eyes TID  . ferrous sulfate  325 mg Oral Q breakfast  . fidaxomicin  200 mg Oral BID  . latanoprost  1 drop Both Eyes QHS  . metoprolol succinate  100 mg Oral Daily  . multivitamin with minerals  1 tablet Oral Daily  . nystatin cream   Topical BID  . Rivaroxaban  15 mg Oral Q supper  . sodium chloride  1 g Oral BID WC  . vitamin C  500 mg Oral Daily   acetaminophen, albuterol, docusate sodium, HYDROcodone-acetaminophen  Assessment/ Plan:  Ms. Heather Larson is a 85 y.o. white  female with atrial fibrillation on anticoagulation, bilateral lower extremity edema, history of breast cancer,congestive heart failure, diarrhea,hyperlipidemia, hypertension, hyponatremia, who was admitted to [facility] on 06/26/2018 for cellulitis.  1.  Hyponatremia, with prior hx of SIADH.  2.  Anemia  . 3.  LLE cellulitis.  4. C. Diff colitis  Plan:   - Fluid restriction - Sodium chloride 1g PO bid -  Monitor serial sodium levels.    LOS: 4 Heather Larson 3/1/20202:58 PM

## 2018-06-30 NOTE — Plan of Care (Signed)
  Problem: Education: Goal: Knowledge of General Education information will improve Description: Including pain rating scale, medication(s)/side effects and non-pharmacologic comfort measures Outcome: Progressing   Problem: Health Behavior/Discharge Planning: Goal: Ability to manage health-related needs will improve Outcome: Progressing   Problem: Clinical Measurements: Goal: Ability to maintain clinical measurements within normal limits will improve Outcome: Progressing   Problem: Nutrition: Goal: Adequate nutrition will be maintained Outcome: Progressing   Problem: Coping: Goal: Level of anxiety will decrease Outcome: Progressing   Problem: Elimination: Goal: Will not experience complications related to bowel motility Outcome: Progressing Goal: Will not experience complications related to urinary retention Outcome: Progressing   Problem: Pain Managment: Goal: General experience of comfort will improve Outcome: Progressing   Problem: Safety: Goal: Ability to remain free from injury will improve Outcome: Progressing   Problem: Skin Integrity: Goal: Risk for impaired skin integrity will decrease Outcome: Progressing   

## 2018-07-01 LAB — CULTURE, BLOOD (ROUTINE X 2)
CULTURE: NO GROWTH
Special Requests: ADEQUATE
Special Requests: ADEQUATE

## 2018-07-01 LAB — BASIC METABOLIC PANEL
Anion gap: 8 (ref 5–15)
BUN: 14 mg/dL (ref 8–23)
CO2: 23 mmol/L (ref 22–32)
Calcium: 8.4 mg/dL — ABNORMAL LOW (ref 8.9–10.3)
Chloride: 100 mmol/L (ref 98–111)
Creatinine, Ser: 0.5 mg/dL (ref 0.44–1.00)
GFR calc Af Amer: >60 mL/min (ref >60)
GFR calc non Af Amer: >60 mL/min (ref >60)
Glucose, Bld: 92 mg/dL (ref 70–99)
Potassium: 4.1 mmol/L (ref 3.5–5.1)
Sodium: 131 mmol/L — ABNORMAL LOW (ref 135–145)

## 2018-07-01 MED ORDER — FIDAXOMICIN 200 MG PO TABS: 200.0000 mg | ORAL_TABLET | Freq: Every day | ORAL | 0 refills | Status: DC

## 2018-07-01 MED ORDER — SODIUM CHLORIDE 1 G PO TABS: 1.0000 g | ORAL_TABLET | Freq: Two times a day (BID) | ORAL | 0 refills | Status: AC

## 2018-07-01 MED ORDER — CEPHALEXIN 500 MG PO CAPS: 500.0000 mg | ORAL_CAPSULE | Freq: Two times a day (BID) | ORAL | 0 refills | Status: DC

## 2018-07-01 MED ORDER — RISAQUAD PO CAPS: 1.0000 | ORAL_CAPSULE | Freq: Every day | ORAL | 0 refills | Status: DC

## 2018-07-01 MED ORDER — PENICILLIN V POTASSIUM 500 MG PO TABS: 500.0000 mg | ORAL_TABLET | Freq: Four times a day (QID) | ORAL | 0 refills | Status: DC

## 2018-07-01 NOTE — Plan of Care (Signed)
  Problem: Education: Goal: Knowledge of General Education information will improve Description: Including pain rating scale, medication(s)/side effects and non-pharmacologic comfort measures Outcome: Progressing   Problem: Health Behavior/Discharge Planning: Goal: Ability to manage health-related needs will improve Outcome: Progressing   Problem: Clinical Measurements: Goal: Ability to maintain clinical measurements within normal limits will improve Outcome: Progressing   Problem: Nutrition: Goal: Adequate nutrition will be maintained Outcome: Progressing   Problem: Coping: Goal: Level of anxiety will decrease Outcome: Progressing   Problem: Elimination: Goal: Will not experience complications related to bowel motility Outcome: Progressing Goal: Will not experience complications related to urinary retention Outcome: Progressing   Problem: Pain Managment: Goal: General experience of comfort will improve Outcome: Progressing   Problem: Safety: Goal: Ability to remain free from injury will improve Outcome: Progressing   Problem: Skin Integrity: Goal: Risk for impaired skin integrity will decrease Outcome: Progressing   

## 2018-07-01 NOTE — Discharge Summary (Signed)
[facility] [place] NAME: Heather Larson    MR#:  [id number]  DATE OF BIRTH:  June 11, 1923  DATE OF ADMISSION:  06/26/2018 ADMITTING PHYSICIAN: [name], MD  DATE OF DISCHARGE: No discharge date for patient encounter.  PRIMARY CARE PHYSICIAN: Pleas Koch, NP    ADMISSION DIAGNOSIS:  Hyponatremia [E87.1] Cellulitis and abscess of left lower extremity [L03.116, L02.416]  DISCHARGE DIAGNOSIS:  Active Problems:   Recurrent cellulitis of lower extremity   SECONDARY DIAGNOSIS:   Past Medical History:  Diagnosis Date  . Anemia   . Atrial fibrillation, permanent    on Xarelto  . Bilateral lower extremity edema   . Bilateral lower leg cellulitis 11/11/2017  . Cancer [facility])    breast cancer at age 32 and skin  . Cellulitis    lower abdomen  . CHF (congestive heart failure) ([place])   . Diarrhea 01/11/2018  . Dysrhythmia    a-fib  . Hyperlipidemia   . Hypertension   . Hypertension   . Incontinence   . Low sodium levels   . Macular degeneration   . Malnutrition of moderate degree 11/23/2017    HOSPITAL COURSE:  85 year old female with past medical history significant for A. 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  ID input appreciated Keep the leg elevated Treated with IV vancomycin, at discharge, per ID recommendations due to recurrent Streptococcus bacteremia's and  cellulitis-plan to do long-term oral prophylactic antibiotics - penicillin 500 mg BID for 3 months - to follow up as outpt in 4-6 weeks  *Acute recurrent C. difficile history status post second stool transplant about a month ago. Now started on vancomycin and having loose stools, C. difficile antigen was negative Discussed with GI and ID - will be discharged on 5 days of Dificid twice daily followed by every other day Dificid and outpatient follow-up with GI s/p discharge for reevaluation Patient's daughter should have Dificid at home-prescription given as well  *Chronic SIADH Improving Continued salt tabs with fluid restriction  *Chronic A. fib-rate controlled Stable on metoprolol, Xarelto  *Chronic hyperlipidemia, unspecified Continue statin therapy  *Independent at baseline Ambulates with a cane. Physical therapy consulted while in house  Disposition Home in 1 to 2 days barring any complications  DISCHARGE CONDITIONS:   stable  CONSULTS OBTAINED:  Treatment Team:  Tsosie Billing, MD Anthonette Legato, MD Gillis Santa, MD  DRUG ALLERGIES:  No Known Allergies  DISCHARGE MEDICATIONS:   Allergies as of 07/01/2018   No Known Allergies     Medication List    STOP taking these medications   dicyclomine 10 MG capsule Commonly known as:  BENTYL   HYDROcodone-acetaminophen 5-325 MG tablet Commonly known as:  NORCO/VICODIN     TAKE these medications   acetaminophen 325 MG tablet Commonly known as:  TYLENOL Take 2 tablets (650 mg total) by mouth every 6 (six) hours as needed for mild pain (or Fever >/= 101).   acidophilus Caps capsule Take 1 capsule by mouth daily.   albuterol 108 (90 Base) MCG/ACT inhaler Commonly known as:  PROVENTIL HFA;VENTOLIN HFA Inhale 2 puffs into the lungs every 6 (six) hours as needed for wheezing or shortness of breath.   atorvastatin 10 MG tablet Commonly known as:  LIPITOR Take 1 tablet (10 mg total) by mouth daily.    brimonidine 0.2 % ophthalmic solution Commonly known as:  ALPHAGAN Place 1 drop into both eyes 3 (three) times daily.   calcium carbonate 1500 (600 Ca) MG Tabs tablet Commonly known as:  OSCAL Take by mouth 2 (two) times daily with a meal.   cephALEXin 500 MG capsule Commonly known as:  KEFLEX Take 1 capsule (500 mg total) by mouth 2 (two) times daily. What changed:  when to take this   diphenhydrAMINE HCl (Sleep) 25 MG Caps Take 25-50 mg by mouth at bedtime as needed (sleep).   dorzolamide 2 % ophthalmic solution Commonly known as:  TRUSOPT Place 1 drop into both eyes 3 (three) times daily.   ferrous sulfate 325 (65 FE) MG EC tablet Take 325 mg by mouth daily with breakfast.   fidaxomicin 200 MG Tabs tablet Commonly known as:  DIFICID Take 1 tablet (200 mg total) by mouth daily. 1 po bid for 5 days, then 1 po qod for 3 months What changed:  additional instructions   furosemide 20 MG tablet Commonly known as:  LASIX TAKE 1 TABLET(20 MG) BY MOUTH EVERY OTHER DAY What changed:  See the new instructions.   metoprolol succinate 100 MG 24 hr tablet Commonly known as:  TOPROL-XL Take 1 tablet (100 mg total) by mouth daily. Take with or immediately following a meal.   MULTIVITAMIN ADULT Tabs Take 1 tablet by mouth daily.   NUTRITIONAL SHAKE PLUS PROTEIN PO Take by mouth.   Rivaroxaban 15 MG Tabs tablet Commonly known as:  XARELTO Take 1 tablet (15 mg total) by mouth daily.   sodium chloride 1 g tablet Take 1 tablet (1 g total) by mouth 2 (two) times daily with a meal.   TRAVATAN Z 0.004 % Soln ophthalmic solution Generic drug:  Travoprost (BAK Free) Place 1 drop into both eyes at bedtime.   vitamin C 500 MG tablet Commonly known as:  ASCORBIC ACID Take 500 mg by mouth daily.        DISCHARGE INSTRUCTIONS:   if you experience worsening of your admission symptoms, develop shortness of breath, life threatening emergency, suicidal or homicidal thoughts you must  seek medical attention immediately by calling 911 or calling your MD immediately  if symptoms less severe.  You Must read complete instructions/literature along with all the possible adverse reactions/side effects for all the Medicines you take and that have been prescribed to you. Take any new Medicines after you have completely understood and accept all the possible adverse reactions/side effects.   Please note  You were cared for by a hospitalist during your hospital stay. If you have any questions about your discharge medications or the care you received while you were in the hospital after you are discharged, you can call the unit and asked to speak with the hospitalist on call if the hospitalist that took care of you is not available. Once you are discharged, your primary care physician will handle any further medical issues. Please note that NO REFILLS for any discharge medications will be authorized once you are discharged, as it is imperative that you return to your primary care physician (or establish a relationship with a primary care physician if you do not have one) for your aftercare needs so  that they can reassess your need for medications and monitor your lab values.    Today   CHIEF COMPLAINT:   Chief Complaint  Patient presents with  . Cellulitis  . Abnormal Lab    HISTORY OF PRESENT ILLNESS:   85 y.o. female with a known history of atrial fibrillation on Xarelto, bilateral lower extremity edema, breast cancer, recurrent lower extremity cellulitis, CHF, C. 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 They did the blood work including CBC and called them to take her to emergency room as her white blood  cell count was 47,000.  Patient also had some low-grade fever at home today.  VITAL SIGNS:  Blood pressure (!) 154/77, pulse 67, temperature 97.7 F (36.5 C), temperature source Oral, resp. rate 18, height 5\' 2"  (1.575 m), weight 59 kg, SpO2 95 %.  I/O:    Intake/Output Summary (Last 24 hours) at 07/01/2018 0805 Last data filed at 07/01/2018 0433 Gross per 24 hour  Intake -  Output 600 ml  Net -600 ml    PHYSICAL EXAMINATION:  GENERAL:  85 y.o.-year-old patient lying in the bed with no acute distress.  EYES: Pupils equal, round, reactive to light and accommodation. No scleral icterus. Extraocular muscles intact.  HEENT: Head atraumatic, normocephalic. Oropharynx and nasopharynx clear.  NECK:  Supple, no jugular venous distention. No thyroid enlargement, no tenderness.  LUNGS: Normal breath sounds bilaterally, no wheezing, rales,rhonchi or crepitation. No use of accessory muscles of respiration.  CARDIOVASCULAR: S1, S2 normal. No murmurs, rubs, or gallops.  ABDOMEN: Soft, non-tender, non-distended. Bowel sounds present. No organomegaly or mass.  EXTREMITIES: No pedal edema, cyanosis, or clubbing.  NEUROLOGIC: Cranial nerves II through XII are intact. Muscle strength 5/5 in all extremities. Sensation intact. Gait not checked.  PSYCHIATRIC: The patient is alert and oriented x 3.  SKIN: No obvious rash, lesion, or ulcer.   DATA REVIEW:   CBC Recent Labs  Lab 06/29/18 0518  WBC 10.1  HGB 10.8*  HCT 33.6*  PLT 313    Chemistries  Recent Labs  Lab 06/26/18 1414  07/01/18 0407  NA 127*   < > 131*  K 3.9   < > 4.1  CL 94*   < > 100  CO2 21*   < > 23  GLUCOSE 140*   < > 92  BUN 16   < > 14  CREATININE 0.75   < > 0.50  CALCIUM 8.9   < > 8.4*  AST 32  --   --   ALT 19  --   --   ALKPHOS 71  --   --   BILITOT 1.1  --   --    < > = values in this interval not displayed.    Cardiac Enzymes No results for input(s): TROPONINI in the last 168 hours.  Microbiology Results   Results for orders placed or performed during the hospital encounter of 06/26/18  Blood Culture (routine x 2)     Status: Abnormal (Preliminary result)   Collection Time: 06/26/18  2:14 PM  Result Value Ref Range Status   Specimen Description   Final    BLOOD BLOOD LEFT ARM Performed at [facility], [address] [place], [place] [address]  Special Requests   Final    BOTTLES DRAWN AEROBIC AND ANAEROBIC Blood Culture adequate volume Performed at [facility], [address] [place], [place] [address]    Culture  Setup Time   Final    AEROBIC BOTTLE ONLY GRAM POSITIVE COCCI IN BOTH AEROBIC AND ANAEROBIC BOTTLES CRITICAL RESULT CALLED TO, READ BACK BY AND VERIFIED WITH: DAVID BESANTI AT 8341 ON 06/29/2018 [place] Performed at [facility], [place] [address] [place], [place] [address]    Culture GROUP B STREP(S.AGALACTIAE)ISOLATED (A)  Final   Report Status PENDING  Incomplete  Blood Culture ID Panel (Reflexed)     Status: Abnormal   Collection Time: 06/26/18  2:14 PM  Result Value Ref Range Status   Enterococcus species NOT DETECTED NOT DETECTED Final   Listeria monocytogenes NOT DETECTED NOT DETECTED Final   Staphylococcus species NOT DETECTED NOT DETECTED Final   Staphylococcus aureus (BCID) NOT DETECTED NOT DETECTED Final   Streptococcus species DETECTED (A) NOT DETECTED Final    Comment: CRITICAL RESULT CALLED TO, READ BACK BY AND VERIFIED WITH: DAVID BESANTI AT 9798 ON 06/29/2018 JJB    Streptococcus agalactiae DETECTED (A) NOT DETECTED Final    Comment: CRITICAL RESULT CALLED TO, READ BACK BY AND VERIFIED WITH: DAVID BESANTI AT 10/25/2018 JJB    Streptococcus pneumoniae NOT DETECTED NOT DETECTED Final   Streptococcus pyogenes NOT DETECTED NOT DETECTED Final   Acinetobacter baumannii NOT DETECTED NOT DETECTED Final   Enterobacteriaceae species NOT DETECTED NOT DETECTED Final   Enterobacter cloacae complex NOT DETECTED NOT DETECTED Final    Escherichia coli NOT DETECTED NOT DETECTED Final   Klebsiella oxytoca NOT DETECTED NOT DETECTED Final   Klebsiella pneumoniae NOT DETECTED NOT DETECTED Final   Proteus species NOT DETECTED NOT DETECTED Final   Serratia marcescens NOT DETECTED NOT DETECTED Final   Haemophilus influenzae NOT DETECTED NOT DETECTED Final   Neisseria meningitidis NOT DETECTED NOT DETECTED Final   Pseudomonas aeruginosa NOT DETECTED NOT DETECTED Final   Candida albicans NOT DETECTED NOT DETECTED Final   Candida glabrata NOT DETECTED NOT DETECTED Final   Candida krusei NOT DETECTED NOT DETECTED Final   Candida parapsilosis NOT DETECTED NOT DETECTED Final   Candida tropicalis NOT DETECTED NOT DETECTED Final    Comment: Performed at [facility], [address] [place], [place] [address]  Blood Culture (routine x 2)     Status: None (Preliminary result)   Collection Time: 06/26/18  3:34 PM  Result Value Ref Range Status   Specimen Description BLOOD BLOOD RIGHT HAND  Final   Special Requests   Final    BOTTLES DRAWN AEROBIC ONLY Blood Culture adequate volume   Culture   Final    NO GROWTH 4 DAYS Performed at [facility], [address] [place], [place] [address]    Report Status PENDING  Incomplete  MRSA PCR Screening     Status: Abnormal   Collection Time: 06/27/18 12:30 AM  Result Value Ref Range Status   MRSA by PCR POSITIVE (A) NEGATIVE Final    Comment:        The GeneXpert MRSA Assay (FDA approved for NASAL specimens only), is one component of a comprehensive MRSA colonization surveillance program. It is not intended to diagnose MRSA infection nor to guide or monitor treatment for MRSA infections. RESULT CALLED TO, READ BACK BY AND VERIFIED WITH: Dorna Bloom 06/27/2018 0224 REC Performed at [facility], [address] [place], [place] [address]  C difficile quick scan w PCR reflex     Status: None   Collection Time: 06/27/18  1:53 PM  Result Value Ref  Range Status   C Diff antigen NEGATIVE NEGATIVE Final   C Diff toxin NEGATIVE NEGATIVE Final   C Diff interpretation No C. difficile detected.  Final    Comment: Performed at [facility], [address] [place], [place] [address]    RADIOLOGY:  No results found.  EKG:   Orders placed or performed during the hospital encounter of 06/26/18  . ED EKG  . ED EKG 12-Lead  . ED EKG  . ED EKG 12-Lead  . EKG 12-Lead  . EKG 12-Lead      Management plans discussed with the patient, family and they are in agreement.  CODE STATUS:     Code Status Orders  (From admission, onward)         Start     Ordered   06/27/18 0013  Do not attempt resuscitation (DNR)  Continuous    Question Answer Comment  In the event of cardiac or respiratory ARREST Do not call a "code blue"   In the event of cardiac or respiratory ARREST Do not perform Intubation, CPR, defibrillation or ACLS   In the event of cardiac or respiratory ARREST Use medication by any route, position, wound care, and other measures to relive pain and suffering. May use oxygen, suction and manual treatment of airway obstruction as needed for comfort.      06/27/18 0012        Code Status History    Date Active Date Inactive Code Status Order ID Comments User Context   05/09/2018 0027 05/12/2018 6789 DNR [id number]  [name], MD Inpatient   03/05/2018 0523 03/08/2018 2145 DNR [id number]  Harrie Foreman, MD Inpatient   01/11/2018 2202 01/14/2018 2043 DNR [id number]  Dustin Flock, MD ED   11/11/2017 1804 11/27/2017 2149 DNR [id number]  Hillary Bow, MD ED   11/11/2017 1731 11/11/2017 1804 Full Code [id number]  Hillary Bow, MD ED      TOTAL TIME TAKING CARE OF THIS PATIENT: 40 minutes.    Avel Peace  M.D on 07/01/2018 at 8:05 AM  Between 7am to 6pm - Pager - [phone]  After 6pm go to [email or web address] - password [email or web address] [facility]  Office  [phone]  CC: Primary care physician;  Pleas Koch, NP   Note: This dictation was prepared with Dragon dictation along with smaller phrase technology. Any transcriptional errors that result from this process are unintentional.

## 2018-07-01 NOTE — Progress Notes (Signed)
[facility]  ROUNDING NOTE   Subjective:   Na 131  Daughter at bedside.   Objective:  Vital signs in last 24 hours:  Temp:  [97.7 F (36.5 C)-98.4 F (36.9 C)] 98.2 F (36.8 C) (03/02 1004) Pulse Rate:  [67-80] 70 (03/02 1004) Resp:  [18-20] 20 (03/02 1004) BP: (120-154)/(45-77) 148/74 (03/02 1004) SpO2:  [94 %-98 %] 94 % (03/02 1004)  Weight change:  Filed Weights   06/26/18 1410  Weight: 59 kg    Intake/Output: I/O last 3 completed shifts: In: -  Out: 1150 [Urine:1150]   Intake/Output this shift:  No intake/output data recorded.  Physical Exam: General: NAD, elderly, laying in bed  Head: Normocephalic, atraumatic. Dry oral mucosal membranes  Eyes: Anicteric, PERRL  Neck: Supple, trachea midline  Lungs:  Clear to auscultation  Heart: irregular  Abdomen:  Soft, nontender,   Extremities: + peripheral edema.  Neurologic: Nonfocal, moving all four extremities  Skin: No lesions        Basic Metabolic Panel: Recent Labs  Lab 06/26/18 1414 06/27/18 0336 06/28/18 0400 06/29/18 0518 07/01/18 0407  NA 127* 125* 125* 128* 131*  K 3.9 3.4* 3.6 3.8 4.1  CL 94* 95* 96* 98 100  CO2 21* 21* 23 23 23   GLUCOSE 140* 104* 103* 105* 92  BUN 16 19 18 16 14   CREATININE 0.75 0.63 0.55 0.52 0.50  CALCIUM 8.9 8.3* 8.3* 8.6* 8.4*    Liver Function Tests: Recent Labs  Lab 06/26/18 1414  AST 32  ALT 19  ALKPHOS 71  BILITOT 1.1  PROT 7.4  ALBUMIN 3.7   No results for input(s): LIPASE, AMYLASE in the last 168 hours. No results for input(s): AMMONIA in the last 168 hours.  CBC: Recent Labs  Lab 06/26/18 1037 06/26/18 1414 06/27/18 0336 06/28/18 0400 06/29/18 0518  WBC 47.5 Repeated and verified X2.* 48.0* 34.5* 18.4* 10.1  NEUTROABS 44.3* 43.6*  --   --   --   HGB 11.6* 11.3* 10.1* 10.0* 10.8*  HCT 35.9* 34.9* 31.1* 31.3* 33.6*  MCV 80.4 81.4 80.8 79.6* 79.6*  PLT 331.0 299 247 256 313    Cardiac Enzymes: No results for input(s):  CKTOTAL, CKMB, CKMBINDEX, TROPONINI in the last 168 hours.  BNP: Invalid input(s): POCBNP  CBG: No results for input(s): GLUCAP in the last 168 hours.  Microbiology: Results for orders placed or performed during the hospital encounter of 06/26/18  Blood Culture (routine x 2)     Status: Abnormal   Collection Time: 06/26/18  2:14 PM  Result Value Ref Range Status   Specimen Description   Final    BLOOD BLOOD LEFT ARM Performed at [facility], [address], [place], [place] [address]    Special Requests   Final    BOTTLES DRAWN AEROBIC AND ANAEROBIC Blood Culture adequate volume Performed at [facility], [address], [place], [place] [address]    Culture  Setup Time   Final    AEROBIC BOTTLE ONLY GRAM POSITIVE COCCI IN BOTH AEROBIC AND ANAEROBIC BOTTLES CRITICAL RESULT CALLED TO, READ BACK BY AND VERIFIED WITH: [place] AT 2229 ON 06/29/2018 [place] Performed at [facility], [place] [address], [place],  [address]    Culture GROUP B STREP(S.AGALACTIAE)ISOLATED (A)  Final   Report Status 07/01/2018 FINAL  Final   Organism ID, Bacteria GROUP B STREP(S.AGALACTIAE)ISOLATED  Final      Susceptibility   Group b strep(s.agalactiae)isolated - MIC*  CLINDAMYCIN >=1 RESISTANT Resistant     AMPICILLIN <=0.25 SENSITIVE Sensitive     ERYTHROMYCIN >=8 RESISTANT Resistant     VANCOMYCIN 0.25 SENSITIVE Sensitive     CEFTRIAXONE <=0.12 SENSITIVE Sensitive     LEVOFLOXACIN 0.5 SENSITIVE Sensitive     * GROUP B STREP(S.AGALACTIAE)ISOLATED  Blood Culture ID Panel (Reflexed)     Status: Abnormal   Collection Time: 06/26/18  2:14 PM  Result Value Ref Range Status   Enterococcus species NOT DETECTED NOT DETECTED Final   Listeria monocytogenes NOT DETECTED NOT DETECTED Final   Staphylococcus species NOT DETECTED NOT DETECTED Final   Staphylococcus aureus (BCID) NOT DETECTED NOT DETECTED Final   Streptococcus species DETECTED (A) NOT DETECTED Final     Comment: CRITICAL RESULT CALLED TO, READ BACK BY AND VERIFIED WITH: DAVID BESANTI AT 1660 ON 06/29/2018 JJB    Streptococcus agalactiae DETECTED (A) NOT DETECTED Final    Comment: CRITICAL RESULT CALLED TO, READ BACK BY AND VERIFIED WITH: DAVID BESANTI AT 10/25/2018 JJB    Streptococcus pneumoniae NOT DETECTED NOT DETECTED Final   Streptococcus pyogenes NOT DETECTED NOT DETECTED Final   Acinetobacter baumannii NOT DETECTED NOT DETECTED Final   Enterobacteriaceae species NOT DETECTED NOT DETECTED Final   Enterobacter cloacae complex NOT DETECTED NOT DETECTED Final   Escherichia coli NOT DETECTED NOT DETECTED Final   Klebsiella oxytoca NOT DETECTED NOT DETECTED Final   Klebsiella pneumoniae NOT DETECTED NOT DETECTED Final   Proteus species NOT DETECTED NOT DETECTED Final   Serratia marcescens NOT DETECTED NOT DETECTED Final   Haemophilus influenzae NOT DETECTED NOT DETECTED Final   Neisseria meningitidis NOT DETECTED NOT DETECTED Final   Pseudomonas aeruginosa NOT DETECTED NOT DETECTED Final   Candida albicans NOT DETECTED NOT DETECTED Final   Candida glabrata NOT DETECTED NOT DETECTED Final   Candida krusei NOT DETECTED NOT DETECTED Final   Candida parapsilosis NOT DETECTED NOT DETECTED Final   Candida tropicalis NOT DETECTED NOT DETECTED Final    Comment: Performed at [facility], [address] [place], [place] [address]  Blood Culture (routine x 2)     Status: None   Collection Time: 06/26/18  3:34 PM  Result Value Ref Range Status   Specimen Description BLOOD BLOOD RIGHT HAND  Final   Special Requests   Final    BOTTLES DRAWN AEROBIC ONLY Blood Culture adequate volume   Culture   Final    NO GROWTH 5 DAYS Performed at [facility], [address] [place], [place] [address]    Report Status 07/01/2018 FINAL  Final  MRSA PCR Screening     Status: Abnormal   Collection Time: 06/27/18 12:30 AM  Result Value Ref Range Status   MRSA by PCR POSITIVE (A)  NEGATIVE Final    Comment:        The GeneXpert MRSA Assay (FDA approved for NASAL specimens only), is one component of a comprehensive MRSA colonization surveillance program. It is not intended to diagnose MRSA infection nor to guide or monitor treatment for MRSA infections. RESULT CALLED TO, READ BACK BY AND VERIFIED WITH: [facility] 06/27/2018 0224 REC Performed at [facility], [address] [place], [place] [address]   C difficile quick scan w PCR reflex     Status: None   Collection Time: 06/27/18  1:53 PM  Result Value Ref Range Status   C Diff antigen NEGATIVE NEGATIVE Final   C Diff toxin NEGATIVE NEGATIVE Final   C Diff interpretation  No C. difficile detected.  Final    Comment: Performed at [facility], [address] [place], [place] [address]    Coagulation Studies: No results for input(s): LABPROT, INR in the last 72 hours.  Urinalysis: No results for input(s): COLORURINE, LABSPEC, PHURINE, GLUCOSEU, HGBUR, BILIRUBINUR, KETONESUR, PROTEINUR, UROBILINOGEN, NITRITE, LEUKOCYTESUR in the last 72 hours.  Invalid input(s): APPERANCEUR    Imaging: No results found.   Medications:   . vancomycin 750 mg (06/30/18 2104)   . acidophilus  1 capsule Oral Daily  . atorvastatin  10 mg Oral Daily  . brimonidine  1 drop Both Eyes TID  . calcium carbonate  600 mg of elemental calcium Oral BID WC  . dicyclomine  10 mg Oral TID AC & HS  . dorzolamide  1 drop Both Eyes TID  . ferrous sulfate  325 mg Oral Q breakfast  . fidaxomicin  200 mg Oral BID  . latanoprost  1 drop Both Eyes QHS  . metoprolol succinate  100 mg Oral Daily  . multivitamin with minerals  1 tablet Oral Daily  . nystatin cream   Topical BID  . Rivaroxaban  15 mg Oral Q supper  . sodium chloride  1 g Oral BID WC  . vitamin C  500 mg Oral Daily   acetaminophen, albuterol, docusate sodium, HYDROcodone-acetaminophen  Assessment/ Plan:  Ms. Dimitri Dsouza is a 85 y.o. white   female with atrial fibrillation on anticoagulation, bilateral lower extremity edema, history of breast cancer,congestive heart failure, diarrhea,hyperlipidemia, hypertension, hyponatremia, who was admitted to [facility] on 06/26/2018 for cellulitis.  1.  Hyponatremia, with prior hx of SIADH.  2.  Anemia  . 3.  LLE cellulitis.  4. C. Diff colitis  Plan:   - Fluid restriction - Sodium chloride 1g PO bid - Monitor serial sodium levels.   Will need Nephrology follow up .   LOS: 5 Brennan Litzinger 3/2/20202:05 PM

## 2018-07-01 NOTE — Discharge Planning (Addendum)
Patient IV removed.  RN assessment and VS revealed stability for DC back to [facility]. Living.  Discharge papers given, explained and educated.  Informed of suggested FU appts and appts made.  Signed [facility] DNR provided. Scripts printed and given.  Once ready, will be wheeled to front and family transporting home via car.

## 2018-07-01 NOTE — Care Management (Signed)
Notified Helene Kelp with [facility] that the patient is [place] home today

## 2018-07-01 NOTE — Care Management Note (Signed)
Case Management Note  Patient Details  Name: Julene Rahn MRN: [id number] Date of Birth: Sep 16, 1923  Subjective/Objective:                   Spoke with Daughter [name] on phone, [phone] The patient has [facility] set up currently, Provided the [facility] list per CMS.gov, woyuld like to remain with [facility], Notified Teresa via email. Patient has no DME needs, he daughter states she has all she needs including a rollator walker. Provided my phione number to the daughter for any needs that may arise  Action/Plan: [facility] list provided per CMS.gov     Expected Discharge Date:  07/01/18               Expected Discharge Plan:     In-House Referral:     Discharge planning Services  CM Consult  Post Acute Care Choice:  Home Health Choice offered to:  Patient, Adult Children  DME Arranged:    DME Agency:     HH Arranged:  PT [place]:  [facility] (formerly [employer])  Status of Service:  In process, will continue to follow  If discussed at Long Length of Stay Meetings, dates discussed:    Additional Comments:  Su Hilt, RN 07/01/2018, 11:10 AM

## 2018-07-01 NOTE — Care Management Important Message (Signed)
Important Message  Patient Details  Name: Heather Larson MRN: [id number] Date of Birth: 09-04-1923   Medicare Important Message Given:       Su Hilt, RN 07/01/2018, 11:12 AM

## 2018-07-02 ENCOUNTER — Encounter: Payer: Self-pay | Admitting: Gastroenterology

## 2018-07-02 ENCOUNTER — Telehealth: Payer: Self-pay

## 2018-07-02 NOTE — Telephone Encounter (Signed)
Spoke with patients daughter, Shauna Hugh.   Transition Care Management Follow-up Telephone Call  Admission: 06/26/2018-07/01/2018 Active Problems: Recurrent cellulitis of lower extremity   How have you been since you were released from the hospital? "She's doing pretty well"   Do you understand why you were in the hospital? yes   Do you understand the discharge instructions? yes, does have questions about medications (Keflex)   Where were you discharged to? Home. Resides at [facility]. Has assistance with shower 2/week and meals if needed.    Items Reviewed:  Medications reviewed: yes  Allergies reviewed: yes  Dietary changes reviewed: yes  Referrals reviewed: yes   Functional Questionnaire:   Activities of Daily Living (ADLs):   She states they are independent in the following: ambulation, feeding, continence, grooming, toileting and dressing States they require assistance with the following: bathing and hygiene   Any transportation issues/concerns?: no   Any patient concerns? yes, Pt prescribed cephalexin, however d/c summary states to stop medication. Please advise (currently not taking)   Confirmed importance and date/time of follow-up visits scheduled yes  Provider Appointment booked with PCP 07/08/2018  Confirmed with patient if condition begins to worsen call PCP or go to the ER.  Patient was given the office number and encouraged to call back with question or concerns.  : yes

## 2018-07-02 NOTE — Telephone Encounter (Signed)
Please notify patient and her daughter that she is not to take the Keflex medication that I prescribed the day she was evaluated in our clinic.  We will see her soon for hospital follow-up.

## 2018-07-03 ENCOUNTER — Telehealth: Payer: Self-pay

## 2018-07-03 NOTE — Telephone Encounter (Signed)
Spoken and notified patient's daughter of Heather Larson's comments. Patient's daughter verbalized understanding.  

## 2018-07-03 NOTE — Telephone Encounter (Signed)
Cindy with [facility] left v/m; pt discharged from hospital on 07/01/18 and [facility] requesting verbal orders to  Resume care orders for nursing due to cellulitis and PT & OT eval and treat.

## 2018-07-03 NOTE — Telephone Encounter (Signed)
Gave the approval for the verbal orders 

## 2018-07-03 NOTE — Telephone Encounter (Signed)
Approved.  

## 2018-07-04 DIAGNOSIS — D040 Carcinoma in situ of skin of lip: Secondary | ICD-10-CM | POA: Diagnosis not present

## 2018-07-08 ENCOUNTER — Ambulatory Visit (INDEPENDENT_AMBULATORY_CARE_PROVIDER_SITE_OTHER): Payer: Medicare HMO | Admitting: Primary Care

## 2018-07-08 ENCOUNTER — Encounter: Payer: Self-pay | Admitting: Primary Care

## 2018-07-08 VITALS — BP 130/76 | HR 78 | Temp 97.8°F | Ht 62.0 in | Wt 129.8 lb

## 2018-07-08 DIAGNOSIS — E871 Hypo-osmolality and hyponatremia: Secondary | ICD-10-CM

## 2018-07-08 DIAGNOSIS — R600 Localized edema: Secondary | ICD-10-CM | POA: Diagnosis not present

## 2018-07-08 DIAGNOSIS — A0471 Enterocolitis due to Clostridium difficile, recurrent: Secondary | ICD-10-CM | POA: Diagnosis not present

## 2018-07-08 DIAGNOSIS — Z09 Encounter for follow-up examination after completed treatment for conditions other than malignant neoplasm: Secondary | ICD-10-CM

## 2018-07-08 DIAGNOSIS — L03119 Cellulitis of unspecified part of limb: Secondary | ICD-10-CM

## 2018-07-08 LAB — BASIC METABOLIC PANEL
BUN: 15 mg/dL (ref 6–23)
CO2: 28 mEq/L (ref 19–32)
Calcium: 9.5 mg/dL (ref 8.4–10.5)
Chloride: 98 mEq/L (ref 96–112)
Creatinine, Ser: 0.71 mg/dL (ref 0.40–1.20)
GFR: 76.8 mL/min (ref >60.00)
Glucose, Bld: 90 mg/dL (ref 70–99)
Potassium: 5.4 mEq/L — ABNORMAL HIGH (ref 3.5–5.1)
Sodium: 132 mEq/L — ABNORMAL LOW (ref 135–145)

## 2018-07-08 LAB — URIC ACID: Uric Acid, Serum: 3.9 mg/dL (ref 2.4–7.0)

## 2018-07-08 NOTE — Progress Notes (Signed)
Subjective:    Patient ID: Heather Larson, female    DOB: 11/23/1923, 85 y.o.   MRN: [id number]  HPI  Ms. Heather Larson is a 85 year old female who presents today for [facility] Follow up.  She presented to our office on 06/26/2018 with a chief complaint of lower abdominal and left lower extremity erythema and swelling.  Also with fatigue, shortness of breath.  She was suspected to have recurrent cellulitis and was treated with oral Keflex in the outpatient setting.  Labs returned with a white blood cell count of 47.5 so she was sent to the emergency department for further treatment.  She was admitted given widespread cellulitis.  Treated with vancomycin per hospitalist and Dificid per GI.  She underwent evaluation for DVT which was negative.  She was consulted by infectious disease who suspected cellulitis to be contributed to athlete's foot due to venous edema and interdigital dryness with split/macerated skin. Nephrology was consulted during her hospital stay due to chronic hyponatremia and initiated oral salt tablets twice daily as well as a 1200 mL fluid restriction.  She was discharged home on 07/01/18 with recommendations for infectious disease, nephrology, GI follow up. She will also do long-term oral prophylactic antibiotics with penicillin 500 mg BID for the next three months. It was also advised she continue her Dificid for c-difficile prevention and sodium chloride tablets.   Since discharge home she is doing well.  She denies new erythema, swelling to her lower extremities.  She denies erythema to her abdomen, fevers, diarrhea, increased weakness.  She has an appointment with infectious disease for recurrent cellulitis and with GI tomorrow. She is compliant to her penicillin and is taking four times daily.  She was unaware that she was to take penicillin 4 times daily for 3 days then reduce to twice daily.  She is following with dermatology who recommended CLn wipes to her feet for  prevention of fungal infection which is thought to be the culprit for her recurrent cellulits. She will be resuming home health physical therapy and nursing soon.  She is regaining strength gradually.  Review of Systems  Constitutional: Positive for fatigue. Negative for fever.       Decreased fatigue  Respiratory: Negative for cough and shortness of breath.   Gastrointestinal: Negative for abdominal pain, diarrhea and nausea.  Skin: Negative for wound.  Neurological: Negative for dizziness.        Past Medical History:  Diagnosis Date  . Anemia   . Atrial fibrillation, permanent    on Xarelto  . Bilateral lower extremity edema   . Bilateral lower leg cellulitis 11/11/2017  . Cancer [facility])    breast cancer at age 54 and skin  . Cellulitis    lower abdomen  . CHF (congestive heart failure) ([place])   . Diarrhea 01/11/2018  . Dysrhythmia    a-fib  . Hyperlipidemia   . Hypertension   . Hypertension   . Incontinence   . Low sodium levels   . Macular degeneration   . Malnutrition of moderate degree 11/23/2017     Social History   Socioeconomic History  . Marital status: Divorced    Spouse name: Not on file  . Number of children: Not on file  . Years of education: Not on file  . Highest education level: Not on file  Occupational History  . Occupation: retired  Scientific laboratory technician  . Financial resource strain: Not on file  . Food insecurity:    Worry: Not  on file    Inability: Not on file  . Transportation needs:    Medical: Not on file    Non-medical: Not on file  Tobacco Use  . Smoking status: Never Smoker  . Smokeless tobacco: Never Used  Substance and Sexual Activity  . Alcohol use: Not Currently    Alcohol/week: 2.0 standard drinks    Types: 2 Shots of liquor per week    Comment: 2 drinks daily  . Drug use: No  . Sexual activity: Not Currently  Lifestyle  . Physical activity:    Days per week: Not on file    Minutes per session: Not on file  . Stress: Not on file   Relationships  . Social connections:    Talks on phone: Not on file    Gets together: Not on file    Attends religious service: Not on file    Active member of club or organization: Not on file    Attends meetings of clubs or organizations: Not on file    Relationship status: Not on file  . Intimate partner violence:    Fear of current or ex partner: Not on file    Emotionally abused: Not on file    Physically abused: Not on file    Forced sexual activity: Not on file  Other Topics Concern  . Not on file  Social History Narrative   Moved from [place].    Past Surgical History:  Procedure Laterality Date  . BREAST SURGERY     right mastectomy  . COLONOSCOPY WITH PROPOFOL N/A 05/31/2018   Procedure: COLONOSCOPY WITH PROPOFOL;  Surgeon: Lin Landsman, MD;  Location: [facility] ENDOSCOPY;  Service: Gastroenterology;  Laterality: N/A;  . EYE SURGERY     x3  . 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 FECAL TRANSPLANT N/A 05/31/2018   Procedure: FECAL TRANSPLANT;  Surgeon: Lin Landsman, MD;  Location: [facility] ENDOSCOPY;  Service: Gastroenterology;  Laterality: N/A;  . IR RADIOLOGIST EVAL & MGMT  09/17/2017  . MASTECTOMY     right  . SHOULDER OPEN ROTATOR CUFF REPAIR     left  . SKIN BIOPSY     skin cancer    Family History  Problem Relation Age of Onset  . Hypertension Mother     No Known Allergies  Current Outpatient Medications on File Prior to Visit  Medication Sig Dispense Refill  . acetaminophen (TYLENOL) 325 MG tablet Take 2 tablets (650 mg total) by mouth every 6 (six) hours as needed for mild pain (or Fever >/= 101).    Marland Kitchen acidophilus (RISAQUAD) CAPS capsule Take 1 capsule by mouth daily. 60 capsule 0  . albuterol (PROVENTIL HFA;VENTOLIN HFA) 108 (90 Base) MCG/ACT inhaler Inhale 2 puffs into the lungs every 6 (six) hours as needed  for wheezing or shortness of breath.    Marland Kitchen atorvastatin (LIPITOR) 10 MG tablet Take 1 tablet (10 mg total) by mouth daily. 90 tablet 3  . brimonidine (ALPHAGAN) 0.2 % ophthalmic solution Place 1 drop into both eyes 3 (three) times daily.  6  . calcium carbonate (OSCAL) 1500 (600 Ca) MG TABS tablet Take by mouth 2 (two) times daily with a meal.    . diphenhydrAMINE HCl, Sleep, 25 MG CAPS Take 25-50 mg by mouth at bedtime as needed (sleep).    . dorzolamide (TRUSOPT) 2 % ophthalmic solution Place 1 drop  into both eyes 3 (three) times daily.  6  . ferrous sulfate 325 (65 FE) MG EC tablet Take 325 mg by mouth daily with breakfast.    . fidaxomicin (DIFICID) 200 MG TABS tablet Take 1 tablet (200 mg total) by mouth daily. 1 po bid for 5 days, then 1 po qod for 3 months 180 tablet 0  . furosemide (LASIX) 20 MG tablet TAKE 1 TABLET(20 MG) BY MOUTH EVERY OTHER DAY (Patient taking differently: Take 20 mg by mouth daily. ) 45 tablet 2  . Multiple Vitamins-Minerals (MULTIVITAMIN ADULT) TABS Take 1 tablet by mouth daily.    . Nutritional Supplements (NUTRITIONAL SHAKE PLUS PROTEIN PO) Take by mouth.    . penicillin v potassium (VEETID) 500 MG tablet Take 1 tablet (500 mg total) by mouth 4 (four) times daily. 180 tablet 0  . Rivaroxaban (XARELTO) 15 MG TABS tablet Take 1 tablet (15 mg total) by mouth daily. 90 tablet 3  . sodium chloride 1 g tablet Take 1 tablet (1 g total) by mouth 2 (two) times daily with a meal. 180 tablet 0  . TRAVATAN Z 0.004 % SOLN ophthalmic solution Place 1 drop into both eyes at bedtime.   5  . vitamin C (ASCORBIC ACID) 500 MG tablet Take 500 mg by mouth daily.    . metoprolol succinate (TOPROL-XL) 100 MG 24 hr tablet Take 1 tablet (100 mg total) by mouth daily. Take with or immediately following a meal. 90 tablet 3   No current facility-administered medications on file prior to visit.     BP 130/76   Pulse 78   Temp 97.8 F (36.6 C) (Oral)   Ht 5\' 2"  (1.575 m)   Wt 129 lb 12 oz  (58.9 kg)   SpO2 98%   BMI 23.73 kg/m    Objective:   Physical Exam  Constitutional: She is oriented to person, place, and time. She appears well-nourished.  Neck: Neck supple.  Cardiovascular: Normal rate and regular rhythm.  Respiratory: Effort normal and breath sounds normal.  Neurological: She is alert and oriented to person, place, and time.  Skin: Skin is warm and dry.  Mild erythema to left lower extremity which is chronic.  Improved significantly when compared to last visit.  Mild to moderate lower extremity edema.  No apparent wounds.  No warmth.  Abdomen without erythema or warmth.  Psychiatric: She has a normal mood and affect.           Assessment & Plan:

## 2018-07-08 NOTE — Assessment & Plan Note (Signed)
No diarrhea or signs of recurrent C. Difficile. Continue Dificid and follow-up with GI as recommended.

## 2018-07-08 NOTE — Assessment & Plan Note (Signed)
Potential skin breakdown between the toes suspected to be culprit of recurrent cellulitis.  Recent hospital admission for same with great recovery.  Continue penicillin, discussed to move to twice daily dosing.  Follow-up with infectious disease as scheduled.  Discussed proper skin care to the feet including cleansing and monitoring for potential infection.  She appears to be doing quite well.  Continue current regimen.  Hospital labs, notes, imaging reviewed.

## 2018-07-08 NOTE — Assessment & Plan Note (Signed)
Compliant to sodium chloride tablets twice daily.  Continue same.  Repeat BMP pending.

## 2018-07-08 NOTE — Patient Instructions (Signed)
Change your penicillin to twice daily moving forward. Continue Dificid.  Stop by the lab prior to leaving today. I will notify you of your results once received.   Follow up with dermatology, GI, and infectious disease as scheduled.  It was a pleasure to see you today!

## 2018-07-09 ENCOUNTER — Encounter: Payer: Self-pay | Admitting: Gastroenterology

## 2018-07-09 ENCOUNTER — Ambulatory Visit (INDEPENDENT_AMBULATORY_CARE_PROVIDER_SITE_OTHER): Payer: Medicare HMO | Admitting: Gastroenterology

## 2018-07-09 ENCOUNTER — Other Ambulatory Visit: Payer: Self-pay

## 2018-07-09 VITALS — BP 154/62 | HR 81 | Resp 17 | Ht 62.0 in | Wt 130.0 lb

## 2018-07-09 DIAGNOSIS — A0471 Enterocolitis due to Clostridium difficile, recurrent: Secondary | ICD-10-CM | POA: Diagnosis not present

## 2018-07-10 NOTE — Progress Notes (Signed)
Please had a first-time involving his right  Cephas Darby, MD [address]  [place]  [place], [place] [address]  Main: [phone]  Fax: [phone]    Gastroenterology Consultation.  Referring Provider:     Pleas Koch, NP Primary Care Physician:  Pleas Koch, NP Primary Gastroenterologist:  Dr. Cephas Darby Reason for Consultation:    Recurrent Clostridium difficile colitis         HPI:   Heather Larson is a 85 y.o. female s seen as a hospital follow-up. Patient was recently discharged from [facility] after being treated for bilateral lower extremity cellulitis, treated with antibiotics and later developed C. Difficile colitis. She was treated with oral vancomycin and IV metronidazole. Discharged home on 2 weeks course of oral vancomycin.   Interval summary: Patient is accompanied by her daughter today who is very involved in her care. Patient's diarrhea has resolved. Currently having one to 2 formed bowel movements daily and sometimes experiences gurgling in her stomach.overall, her energy levels are significantly better, able to socialize with her friends and more active. She started taking iron every other day as recommended by her primary care provider because of microcytic anemia. Patient's hemoglobin was 11.9 in 03/2017 with normal MCV. And her hemoglobin has declined in last 1 month when she was admitted to [facility] with cellulitis and C. Difficile. Her hemoglobin nadir was 8.2, MCV 77 and reactive thrombocytosis. Patient denies having black tarry stools or rectal bleeding. She did have some bloody diarrhea and she has C. Difficile colitis. Her hemoglobin after discharge is 9.3 on 12/05/2017.  Follow-up visit in 01/29/2018 She was readmitted to [facility] about 2 weeks ago secondary to recurrence of C. Difficile.  Her WBC count at that time was 43,000, she was started on oral vancomycin 125 MCG 4 times daily.  During  the hospital stay, her WBC count improved to 13,000.  She was discharged home on oral vancomycin, her stools are more formed at the time of discharge.  Her diarrhea has resolved at this time.  She continues to take oral vancomycin 4 times daily which is her week 3.  She reports abdominal bloating.  She is taking acidophilus along with antibiotics.  She is also taking oral iron once a day.  She reports that she feels well otherwise  Follow-up visit 02/21/2018 Patient denies any complaints of diarrhea.  She has abdominal bloating, sometimes her stools are soft, on average 2 times daily.  Nonbloody, appetite is intact.  She is taking oral iron daily.  Currently, on vancomycin taper 1 pill 2-3 times every week.  Patient is accompanied by her daughter today.  Recent blood work shows that her hemoglobin is improving  Follow-up visit 04/02/18 Patient had second recurrence of C. Difficile, confirmed on 03/15/2018.  She had elevated leukocytosis, 20.3K.  She was started on oral vancomycin 125mg  4 times daily for 2 weeks.  Patient finished the course on Friday last week. Leukocytosis resolved on repeat labs on 03/26/2018 at cardiologist's office.  Currently, she reports having 1-2 semi-formed, nonbloody bowel movements daily.  She does have abdominal bloating.  She is taking probiotics as well as oral iron 1 pill daily.  Her hemoglobin has been stable.  Patient is accompanied by her daughter today.  Follow-up visit 05/24/2018 Since last visit, patient underwent FMT by Dr. Bonna Gains on 05/03/2018 when he was on vacation.  Procedure went well.  Unfortunately, patient developed cellulitis in her bilateral lower  extremities and lower abdomen requiring hospital admission from 05/08/2018 to 05/12/2018.  She received IV antibiotics and was discharged home on 5 days of amoxicillin.  Her cellulitis has completely resolved.  But for the last 5 days, patient developed recurrence of nonbloody diarrhea causing fecal incontinence  associated with lower abdominal cramps, temperature 100, decreased appetite.  She is overwhelmed with his ongoing symptoms as it has disrupted her quality of life.  She is not able to spend time with her friends at assisted living facility.  She is restricted to her apartment which she does not like it.  Cellulitis has almost resolved, finished antibiotic course, swelling of legs has improved  Follow-up visit 07/09/2018 Patient underwent second FMT on 05/31/2018 successfully.  Unfortunately, she had another episode of cellulitis with significant leukocytosis for which she was admitted to [facility] in last week of February.  Infectious disease was consulted and recommendation was made that she should be on a prophylactic antibiotic as she had recurrent cellulitis and she continued to stay on Dificid during the hospitalization to prevent recurrence of C. difficile and discharged home on Dificid along with penicillin.  She is accompanied by her daughter today.  She feels great.  She reports her stools are formed.  She denies any GI symptoms today.  Reports that she is back to her normal life and very much pleased. She is currently on Dificid 1 pill a day  NSAIDs: none  Antiplts/Anticoagulants/Anti thrombotics: none  GI Procedures:  Colonoscopy 05/03/2018 - Diverticulosis in the sigmoid colon. 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 - No specimens collected.  Past Medical History:  Diagnosis Date  . Anemia   . Atrial fibrillation, permanent    on Xarelto  . Bilateral lower extremity edema   . Bilateral lower leg cellulitis 11/11/2017  . Cancer [facility])    breast cancer at age 58 and skin  . Cellulitis    lower abdomen  . CHF (congestive heart failure) ([place])   . Diarrhea 01/11/2018  . Dysrhythmia    a-fib  . Hyperlipidemia   . Hypertension   . Hypertension   . Incontinence   . Low sodium levels   . Macular degeneration   . Malnutrition of moderate degree 11/23/2017     Past Surgical History:  Procedure Laterality Date  . BREAST SURGERY     right mastectomy  . COLONOSCOPY WITH PROPOFOL N/A 05/31/2018   Procedure: COLONOSCOPY WITH PROPOFOL;  Surgeon: Lin Landsman, MD;  Location: [facility] ENDOSCOPY;  Service: Gastroenterology;  Laterality: N/A;  . EYE SURGERY     x3  . 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 FECAL TRANSPLANT N/A 05/31/2018   Procedure: FECAL TRANSPLANT;  Surgeon: Lin Landsman, MD;  Location: [facility] ENDOSCOPY;  Service: Gastroenterology;  Laterality: N/A;  . IR RADIOLOGIST EVAL & MGMT  09/17/2017  . MASTECTOMY     right  . SHOULDER OPEN ROTATOR CUFF REPAIR     left  . SKIN BIOPSY     skin cancer    Current Outpatient Medications:  .  acetaminophen (TYLENOL) 325 MG tablet, Take 2 tablets (650 mg total) by mouth every 6 (six) hours as needed for mild pain (or Fever >/= 101)., Disp: , Rfl:  .  acidophilus (RISAQUAD) CAPS capsule, Take 1 capsule by mouth daily., Disp: 60 capsule, Rfl: 0 .  albuterol (PROVENTIL HFA;VENTOLIN HFA) 108 (  90 Base) MCG/ACT inhaler, Inhale 2 puffs into the lungs every 6 (six) hours as needed for wheezing or shortness of breath., Disp: , Rfl:  .  atorvastatin (LIPITOR) 10 MG tablet, Take 1 tablet (10 mg total) by mouth daily., Disp: 90 tablet, Rfl: 3 .  brimonidine (ALPHAGAN) 0.2 % ophthalmic solution, Place 1 drop into both eyes 3 (three) times daily., Disp: , Rfl: 6 .  calcium carbonate (OSCAL) 1500 (600 Ca) MG TABS tablet, Take by mouth 2 (two) times daily with a meal., Disp: , Rfl:  .  diphenhydrAMINE HCl, Sleep, 25 MG CAPS, Take 25-50 mg by mouth at bedtime as needed (sleep)., Disp: , Rfl:  .  dorzolamide (TRUSOPT) 2 % ophthalmic solution, Place 1 drop into both eyes 3 (three) times daily., Disp: , Rfl: 6 .  ferrous sulfate 325 (65 FE) MG EC tablet, Take 325 mg by mouth  daily with breakfast., Disp: , Rfl:  .  fidaxomicin (DIFICID) 200 MG TABS tablet, Take 1 tablet (200 mg total) by mouth daily. 1 po bid for 5 days, then 1 po qod for 3 months, Disp: 180 tablet, Rfl: 0 .  furosemide (LASIX) 20 MG tablet, TAKE 1 TABLET(20 MG) BY MOUTH EVERY OTHER DAY (Patient taking differently: Take 20 mg by mouth daily. ), Disp: 45 tablet, Rfl: 2 .  Multiple Vitamins-Minerals (MULTIVITAMIN ADULT) TABS, Take 1 tablet by mouth daily., Disp: , Rfl:  .  Nutritional Supplements (NUTRITIONAL SHAKE PLUS PROTEIN PO), Take by mouth., Disp: , Rfl:  .  penicillin v potassium (VEETID) 500 MG tablet, Take 1 tablet (500 mg total) by mouth 4 (four) times daily., Disp: 180 tablet, Rfl: 0 .  Rivaroxaban (XARELTO) 15 MG TABS tablet, Take 1 tablet (15 mg total) by mouth daily., Disp: 90 tablet, Rfl: 3 .  sodium chloride 1 g tablet, Take 1 tablet (1 g total) by mouth 2 (two) times daily with a meal., Disp: 180 tablet, Rfl: 0 .  TRAVATAN Z 0.004 % SOLN ophthalmic solution, Place 1 drop into both eyes at bedtime. , Disp: , Rfl: 5 .  vitamin C (ASCORBIC ACID) 500 MG tablet, Take 500 mg by mouth daily., Disp: , Rfl:  .  metoprolol succinate (TOPROL-XL) 100 MG 24 hr tablet, Take 1 tablet (100 mg total) by mouth daily. Take with or immediately following a meal., Disp: 90 tablet, Rfl: 3  Family History  Problem Relation Age of Onset  . Hypertension Mother      Social History   Tobacco Use  . Smoking status: Never Smoker  . Smokeless tobacco: Never Used  Substance Use Topics  . Alcohol use: Not Currently    Alcohol/week: 2.0 standard drinks    Types: 2 Shots of liquor per week    Comment: 2 drinks daily  . Drug use: No    Allergies as of 07/09/2018  . (No Known Allergies)    Review of Systems:    All systems reviewed and negative except where noted in HPI.   Physical Exam:  BP (!) 154/62 (BP Location: Left Arm, Patient Position: Sitting, Cuff Size: Normal)   Pulse 81   Resp 17   Ht 5'  2" (1.575 m)   Wt 130 lb (59 kg)   BMI 23.78 kg/m  No LMP recorded. Patient is postmenopausal.  General:   Alert, well-built, appropriate for her age, pleasant and cooperative in NAD Head:  Normocephalic and atraumatic. Eyes:  Sclera clear, no icterus.   Conjunctiva pink. Ears:  Normal auditory  acuity. Nose:  No deformity, discharge, or lesions. Mouth:  No deformity or lesions,oropharynx pink & moist. Neck:  Supple; no masses or thyromegaly. Lungs:  Respirations even and unlabored.  Clear throughout to auscultation.   No wheezes, crackles, or rhonchi. No acute distress. Heart:  Regular rate and rhythm; no murmurs, clicks, rubs, or gallops. Abdomen:  Normal bowel sounds. Soft, non-tender and nondistended, without masses, hepatosplenomegaly or hernias noted.  No guarding or rebound tenderness.   Rectal: Not performed Msk:  Symmetrical without gross deformities. Good, equal movement & strength bilaterally. Pulses:  Normal pulses noted. Extremities:  No clubbing or edema.  No cyanosis. Neurologic:  Alert and oriented x3;  grossly normal neurologically. Skin:  Intact without significant lesions or rashes. No jaundice, 3+ nonpitting edema likely secondary to venous stasis. Lymph Nodes:  No significant cervical adenopathy. Psych:  Alert and cooperative. Normal mood and affect.  Imaging Studies: No abdominal imaging  Assessment and Plan:   Claris Pech is a 85 y.o. Caucasian female with history of A. Fib on rivaroxaban, hypertension, admission to [facility] in 10/2017 with lower extremity cellulitis and subsequently developed C. Difficile in the hospital, followed by multiple recurrences, treated with oral vancomycin, fidaxomicin and underwent FMT on 05/13/2018 followed by recurrence of cellulitis after FMT requiring IV and oral antibiotics.  This has resulted in recurrence of C. difficile, underwent second FM T on 05/31/2018.  She had another recurrence of cellulitis in  06/2018.  She is currently on prophylactic long-term antibiotics to prevent recurrence of cellulitis.  My recommendation is that she should stay on fidaxomicin to prevent recurrence of C. difficile as long as she stays on other antibiotics for any other systemic infections.  Recommended her to decrease fidaxomicin to 1 pill every other day.  Advised her that she can increase the dose to once a day or 2 times a day for 3 to 5 days if her diarrhea worsens, then go back to once a day or 1 pill every other day.  Her diarrhea has currently resolved.  She will follow-up with Dr. Steva Ready for cellulitis   Follow up in 3 months   Cephas Darby, MD

## 2018-07-12 ENCOUNTER — Telehealth: Payer: Self-pay

## 2018-07-12 NOTE — Telephone Encounter (Signed)
Marlowe Kays OT with [facility] left v/m requesting verbal orders HH OT 1 x a wk for 4 wks.

## 2018-07-12 NOTE — Telephone Encounter (Signed)
Approved.  

## 2018-07-15 NOTE — Telephone Encounter (Signed)
Message left for Marlowe Kays to return my call.

## 2018-07-16 ENCOUNTER — Inpatient Hospital Stay: Payer: Medicare HMO | Admitting: Infectious Diseases

## 2018-07-16 NOTE — Telephone Encounter (Signed)
Message left for Marlowe Kays to return my call.

## 2018-07-17 NOTE — Telephone Encounter (Signed)
Gave the approval to [facility]

## 2018-07-23 ENCOUNTER — Ambulatory Visit: Payer: Medicare Other | Admitting: Family

## 2018-08-06 MED ORDER — FUROSEMIDE 20 MG PO TABS: ORAL_TABLET | ORAL | 2 refills | Status: DC

## 2018-08-06 NOTE — Telephone Encounter (Signed)
Spoke to patient to confirm date and time of upcoming telephone visit.   Consented verbally.

## 2018-08-06 NOTE — Progress Notes (Signed)
Patient daughter calling back Stated that she needed to give Iva this phone number [phone] in order to speak with Earlie Server in regards to Lasix medication Did not want to schedule at this time

## 2018-08-06 NOTE — Telephone Encounter (Signed)
Call to daughter, Heather Larson in regards to Heather Larson. I refilled Lasix as requested.   I asked daughter if we can schedule video/tele visit since she is post hospital and recently cancelled with Darylene Price, DNP in the heart failure clinic.   [name] wanted to check her schedule and call me back.

## 2018-08-13 NOTE — Progress Notes (Signed)
Virtual Visit via Telephone Note   This visit type was conducted due to national recommendations for restrictions regarding the COVID-19 Pandemic (e.g. social distancing) in an effort to limit this patient's exposure and mitigate transmission in our community.  Due to her co-morbid illnesses, this patient is at least at moderate risk for complications without adequate follow up.  This format is felt to be most appropriate for this patient at this time.  The patient did not have access to video technology/had technical difficulties with video requiring transitioning to audio format only (telephone).  All issues noted in this document were discussed and addressed.  No physical exam could be performed with this format.  Please refer to the patient's chart for her  consent to telehealth for [facility].   Evaluation Performed:  Follow-up visit  Date:  08/14/2018   ID:  Heather Larson, DOB February 13, 1924, MRN [id number]  Patient Location: Home  Provider Location: Home  PCP:  Pleas Koch, NP  Cardiologist:  Ida Rogue, MD  Electrophysiologist:  None   Chief Complaint:  Telehealth follow up  History of Present Illness:    Heather Larson is a 85 y.o. female who presents via audio/video conferencing for a telehealth visit today.  She has history of permanent Afib on Xarelto,chronic diastolic CHF,lower extremity edema felt to be in the setting of dependent edema exacerbated by diltiazem use, HTN, HLD, chronic hyponatremia, iron deficiency anemia,recurrent C. difficile colitis, andbreast cancer s/p mastectomy.  Patient was initially diagnosed with Afib in 2014 and has been anticoagulated with Xarelto. Echo from 2014 showed EF 60-65%, biatrial enlargement, aortic valve sclerosis, trivial AI, mild MR and TR. Patient established with Dr. Rockey Situ in 07/2017 for evaluation of Afib and lower extremity swelling. At that time, the patient had stopped Bumex several months prior as she was bothered  by frequent voiding and incontinence. She was noted to have been on diltiazem at that time as well. Echo in 07/2017 showed an EF of 55-60%, no RWMA, mild AI, mild to moderate MR, moderately dilated LA, RVSF normal, PASP normal. Given her lower extremity swelling, her diltiazem was stopped and she was placed on metoprolol for rate control.  She was admitted to the hospital in 03/2018 with mild volume overload and worsening iron deficiency anemia requiring blood transfusion.  She was gently diuresed. Echo showed an EF of 60-65%, no regional wall motion abnormalities, mild MR, mildly dilated left atrium, normal RV systolic function, and normal PASP.  Post hospital course was complicated for C. difficile colitis requiring fecal transplant in early 2020.  She was last seen in the office in late 03/2018 and was doing well from a cardiac perspective.  Her Lasix was decreased to 20 mg every other day given her bout with C. difficile colitis and fluid losses through diarrhea.  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  Since she was last seen in the office she has had 2 hospitalizations for recurrent lower extremity cellulitis complicated by bacteremia with recommendation for suppressive antibiotics per ID given recurrence of cellulitis.  Labs: 06/2017 - sodium 132, potassium 5.4, serum creatinine 0.71 06/2017 - WBC 10.1, Hgb 10.8, PLT 313, TSH normal  She is doing well today. She denies any chest pain, SOB, palpitations, dizziness, presyncope, or syncope. She continues to note chronic lower extremity edema which is stable. She has been taking Lasix 10 mg bid since her most recent hospital discharge with most recent BMET from 06/2018 demonstrating stable  renal function. Her weight remains stable running between 126-128 pounds. She is monitoring to salt and fluid intake. She is elevating her legs when sitting. No falls, BRBPR, or melena. Since having her second fecal  transplant, her diarrhea has resolved. She does not have any active cardiac concerns at this time.   The patient does not have symptoms concerning for COVID-19 infection (fever, chills, cough, or new shortness of breath).    Past Medical History:  Diagnosis Date   Anemia    Atrial fibrillation, permanent    on Xarelto   Bilateral lower extremity edema    Bilateral lower leg cellulitis 11/11/2017   Cancer ([place])    breast cancer at age 87 and skin   Cellulitis    lower abdomen   CHF (congestive heart failure) ([place])    Diarrhea 01/11/2018   Dysrhythmia    a-fib   Hyperlipidemia    Hypertension    Hypertension    Incontinence    Low sodium levels    Macular degeneration    Malnutrition of moderate degree 11/23/2017   Past Surgical History:  Procedure Laterality Date   BREAST SURGERY     right mastectomy   COLONOSCOPY WITH PROPOFOL N/A 05/31/2018   Procedure: COLONOSCOPY WITH PROPOFOL;  Surgeon: Lin Landsman, MD;  Location: [place];  Service: Gastroenterology;  Laterality: N/A;   EYE SURGERY     x3   FECAL TRANSPLANT N/A 05/03/2018   Procedure: FECAL TRANSPLANT;  Surgeon: Virgel Manifold, MD;  Location: [facility] ENDOSCOPY;  Service: Gastroenterology;  Laterality: N/A;   FECAL TRANSPLANT N/A 05/31/2018   Procedure: FECAL TRANSPLANT;  Surgeon: Lin Landsman, MD;  Location: [facility] ENDOSCOPY;  Service: Gastroenterology;  Laterality: N/A;   IR RADIOLOGIST EVAL & MGMT  09/17/2017   MASTECTOMY     right   SHOULDER OPEN ROTATOR CUFF REPAIR     left   SKIN BIOPSY     skin cancer     Current Meds  Medication Sig   acetaminophen (TYLENOL) 325 MG tablet Take 2 tablets (650 mg total) by mouth every 6 (six) hours as needed for mild pain (or Fever >/= 101).   acidophilus (RISAQUAD) CAPS capsule Take 1 capsule by mouth daily.   albuterol (PROVENTIL HFA;VENTOLIN HFA) 108 (90 Base) MCG/ACT inhaler Inhale 2 puffs into the lungs every 6 (six) hours  as needed for wheezing or shortness of breath.   atorvastatin (LIPITOR) 10 MG tablet Take 1 tablet (10 mg total) by mouth daily.   brimonidine (ALPHAGAN) 0.2 % ophthalmic solution Place 1 drop into both eyes 3 (three) times daily. Taking BID per patient   calcium carbonate (OSCAL) 1500 (600 Ca) MG TABS tablet Take by mouth 2 (two) times daily with a meal.   diphenhydrAMINE HCl, Sleep, 25 MG CAPS Take 25-50 mg by mouth at bedtime as needed (sleep).   dorzolamide (TRUSOPT) 2 % ophthalmic solution Place 1 drop into both eyes 3 (three) times daily.   ferrous sulfate 325 (65 FE) MG EC tablet Take 325 mg by mouth daily with breakfast.   fidaxomicin (DIFICID) 200 MG TABS tablet Take 1 tablet (200 mg total) by mouth daily. 1 po bid for 5 days, then 1 po qod for 3 months   furosemide (LASIX) 20 MG tablet TAKE 1 TABLET(20 MG) BY MOUTH EVERY OTHER DAY   metoprolol succinate (TOPROL-XL) 100 MG 24 hr tablet Take 1 tablet (100 mg total) by mouth daily. Take with or immediately following a meal.  Multiple Vitamins-Minerals (MULTIVITAMIN ADULT) TABS Take 1 tablet by mouth daily.   Nutritional Supplements (NUTRITIONAL SHAKE PLUS PROTEIN PO) Take by mouth.   penicillin v potassium (VEETID) 500 MG tablet Take 1 tablet (500 mg total) by mouth 4 (four) times daily. (Patient taking differently: Take 500 mg by mouth 4 (four) times daily. Taking BID per patient)   Rivaroxaban (XARELTO) 15 MG TABS tablet Take 1 tablet (15 mg total) by mouth daily.   sodium chloride 1 g tablet Take 1 tablet (1 g total) by mouth 2 (two) times daily with a meal.   TRAVATAN Z 0.004 % SOLN ophthalmic solution Place 1 drop into both eyes at bedtime.    vitamin C (ASCORBIC ACID) 500 MG tablet Take 500 mg by mouth daily.     Allergies:   Patient has no known allergies.   Social History   Tobacco Use   Smoking status: Never Smoker   Smokeless tobacco: Never Used  Substance Use Topics   Alcohol use: Not Currently     Alcohol/week: 2.0 standard drinks    Types: 2 Shots of liquor per week    Comment: 2 drinks daily   Drug use: No     Family Hx: The patient's family history includes Hypertension in her mother.  ROS:   Please see the history of present illness.     All other systems reviewed and are negative.   Prior CV studies:   The following studies were reviewed today:  2D Echo 03/2018: - Left ventricle: The cavity size was normal. Systolic function was   normal. The estimated ejection fraction was in the range of 60%   to 65%. Wall motion was normal; there were no regional wall   motion abnormalities. - Aortic valve: Transvalvular velocity was not recorded. - Mitral valve: There was mild regurgitation. - Left atrium: The atrium was mildly dilated. - Right ventricle: Poorly visualized. Systolic function was grossly   normal. - Pulmonary arteries: Systolic pressure was within the normal   range. __________  Labs/Other Tests and Data Reviewed:    EKG:  No ECG reviewed.  Recent Labs: 03/04/2018: B Natriuretic Peptide 903.0 05/24/2018: Magnesium 2.1 06/26/2018: ALT 19 06/28/2018: TSH 3.394 06/29/2018: Hemoglobin 10.8; Platelets 313 07/08/2018: BUN 15; Creatinine, Ser 0.71; Potassium 5.4; Sodium 132   Recent Lipid Panel Lab Results  Component Value Date/Time   CHOL 141 07/31/2017 11:31 AM   TRIG 53.0 07/31/2017 11:31 AM   HDL 74.00 07/31/2017 11:31 AM   CHOLHDL 2 07/31/2017 11:31 AM   LDLCALC 56 07/31/2017 11:31 AM    Wt Readings from Last 3 Encounters:  08/14/18 128 lb (58.1 kg)  07/09/18 130 lb (59 kg)  07/08/18 129 lb 12 oz (58.9 kg)     Objective:    Vital Signs:  BP 124/78 (BP Location: Left Arm, Patient Position: Sitting)    Pulse 70    Temp 98 F (36.7 C)    Ht 5\' 2"  (1.575 m)    Wt 128 lb (58.1 kg)    SpO2 97%    BMI 23.41 kg/m    Well nourished, well developed female in no acute distress.   ASSESSMENT & PLAN:    1. Permanent Afib: Ventricular is well controlled  on patient reported vitals today. Continue current dose of Toprol XL for rate control. She remains on Xarelto 15 mg daily given most recent CrCl of 45.4 mL/min.  2. HFpEF: She appears to be euvolemic subjectively and denies any symptoms of decompensation. Continue  current dose of Lasix 10 mg bid. Most recent BMP demonstrated stable renal function on this dose. CHF education.   3. Lower extremity swelling/lymphedema: Likely a component of dependent edema with chronic venous insufficiency. Continue lymphedema pumps per CHF clinic. Elevate legs. Lasix as above.   4. Iron deficiency anemia: Most recent HGB stable.   5. HTN: Blood pressure is well controlled today. Continue current medications.   6. HLD: LDL of 56 from 07/2017. Continue Lipitor.   COVID-19 Education: The signs and symptoms of COVID-19 were discussed with the patient and how to seek care for testing (follow up with PCP or arrange E-visit).  The importance of social distancing was discussed today.  Time:   Today, I have spent 17 minutes with the patient with telehealth technology discussing the above problems.     Medication Adjustments/Labs and Tests Ordered: Current medicines are reviewed at length with the patient today.  Concerns regarding medicines are outlined above.   Tests Ordered: No orders of the defined types were placed in this encounter.   Medication Changes: No orders of the defined types were placed in this encounter.   Disposition:  Follow up in 6 month(s)  Signed, Christell Faith, PA-C  08/14/2018 2:06 PM    [place]

## 2018-08-14 ENCOUNTER — Other Ambulatory Visit: Payer: Self-pay

## 2018-08-14 ENCOUNTER — Encounter: Payer: Self-pay | Admitting: Physician Assistant

## 2018-08-14 ENCOUNTER — Telehealth (INDEPENDENT_AMBULATORY_CARE_PROVIDER_SITE_OTHER): Payer: Medicare HMO | Admitting: Physician Assistant

## 2018-08-14 VITALS — BP 124/78 | HR 70 | Temp 98.0°F | Ht 62.0 in | Wt 128.0 lb

## 2018-08-14 DIAGNOSIS — E785 Hyperlipidemia, unspecified: Secondary | ICD-10-CM

## 2018-08-14 DIAGNOSIS — I5030 Unspecified diastolic (congestive) heart failure: Secondary | ICD-10-CM | POA: Diagnosis not present

## 2018-08-14 DIAGNOSIS — I4821 Permanent atrial fibrillation: Secondary | ICD-10-CM

## 2018-08-14 DIAGNOSIS — I872 Venous insufficiency (chronic) (peripheral): Secondary | ICD-10-CM

## 2018-08-14 DIAGNOSIS — A0471 Enterocolitis due to Clostridium difficile, recurrent: Secondary | ICD-10-CM | POA: Diagnosis not present

## 2018-08-14 DIAGNOSIS — I5032 Chronic diastolic (congestive) heart failure: Secondary | ICD-10-CM

## 2018-08-14 DIAGNOSIS — I110 Hypertensive heart disease with heart failure: Secondary | ICD-10-CM | POA: Diagnosis not present

## 2018-08-14 DIAGNOSIS — E782 Mixed hyperlipidemia: Secondary | ICD-10-CM

## 2018-08-14 DIAGNOSIS — Z79899 Other long term (current) drug therapy: Secondary | ICD-10-CM | POA: Diagnosis not present

## 2018-08-14 DIAGNOSIS — D509 Iron deficiency anemia, unspecified: Secondary | ICD-10-CM

## 2018-08-14 DIAGNOSIS — I10 Essential (primary) hypertension: Secondary | ICD-10-CM

## 2018-08-14 DIAGNOSIS — F419 Anxiety disorder, unspecified: Secondary | ICD-10-CM

## 2018-08-14 DIAGNOSIS — R600 Localized edema: Secondary | ICD-10-CM

## 2018-08-14 DIAGNOSIS — L03311 Cellulitis of abdominal wall: Secondary | ICD-10-CM | POA: Diagnosis not present

## 2018-08-14 DIAGNOSIS — Z7901 Long term (current) use of anticoagulants: Secondary | ICD-10-CM

## 2018-08-14 DIAGNOSIS — A408 Other streptococcal sepsis: Secondary | ICD-10-CM

## 2018-08-14 DIAGNOSIS — I890 Lymphedema, not elsewhere classified: Secondary | ICD-10-CM | POA: Diagnosis not present

## 2018-08-14 DIAGNOSIS — D649 Anemia, unspecified: Secondary | ICD-10-CM

## 2018-08-14 DIAGNOSIS — E440 Moderate protein-calorie malnutrition: Secondary | ICD-10-CM

## 2018-08-14 DIAGNOSIS — H3530 Unspecified macular degeneration: Secondary | ICD-10-CM

## 2018-08-14 DIAGNOSIS — Z853 Personal history of malignant neoplasm of breast: Secondary | ICD-10-CM

## 2018-08-14 DIAGNOSIS — L03116 Cellulitis of left lower limb: Secondary | ICD-10-CM

## 2018-08-14 NOTE — Patient Instructions (Signed)
It was a pleasure to speak with you on the phone today! Thank you for allowing [place] to continue taking care of your [facility] needs during this time.   Feel free to call as needed for questions and concerns related to your cardiac needs.   Medication Instructions:  Your physician recommends that you continue on your current medications as directed. Please refer to the Current Medication list given to you today.  If you need a refill on your cardiac medications before your next appointment, please call your pharmacy.   Lab work: None ordered  If you have labs (blood work) drawn today and your tests are completely normal, you will receive your results only by: Marland Kitchen MyChart Message (if you have MyChart) OR . A paper copy in the mail If you have any lab test that is abnormal or we need to change your treatment, we will call you to review the results.  Testing/Procedures: None ordered  Follow-Up: At [facility], you and your health needs are our priority.  As part of our continuing mission to provide you with exceptional heart care, we have created designated Provider Care Teams.  These Care Teams include your primary Cardiologist (physician) and Advanced Practice Providers (APPs -  Physician Assistants and Nurse Practitioners) who all work together to provide you with the care you need, when you need it. You will need a follow up appointment in 6 months.  Please call our office 2 months in advance to schedule this appointment.  You may see Ida Rogue, MD or Christell Faith, PA-C.

## 2018-08-16 DIAGNOSIS — L03119 Cellulitis of unspecified part of limb: Secondary | ICD-10-CM

## 2018-08-18 MED ORDER — PENICILLIN V POTASSIUM 500 MG PO TABS: 500.0000 mg | ORAL_TABLET | Freq: Two times a day (BID) | ORAL | 1 refills | Status: DC

## 2018-08-28 ENCOUNTER — Other Ambulatory Visit: Payer: Self-pay

## 2018-08-28 MED ORDER — ATORVASTATIN CALCIUM 10 MG PO TABS: 10.0000 mg | ORAL_TABLET | Freq: Every day | ORAL | 3 refills | Status: DC

## 2018-09-03 ENCOUNTER — Other Ambulatory Visit: Payer: Self-pay

## 2018-09-03 ENCOUNTER — Ambulatory Visit: Payer: Medicare HMO | Attending: Infectious Diseases | Admitting: Infectious Diseases

## 2018-09-03 DIAGNOSIS — Z872 Personal history of diseases of the skin and subcutaneous tissue: Secondary | ICD-10-CM

## 2018-09-03 DIAGNOSIS — Z792 Long term (current) use of antibiotics: Secondary | ICD-10-CM | POA: Diagnosis not present

## 2018-09-03 DIAGNOSIS — Z8719 Personal history of other diseases of the digestive system: Secondary | ICD-10-CM

## 2018-09-03 DIAGNOSIS — I4891 Unspecified atrial fibrillation: Secondary | ICD-10-CM

## 2018-09-03 DIAGNOSIS — B353 Tinea pedis: Secondary | ICD-10-CM | POA: Diagnosis not present

## 2018-09-03 DIAGNOSIS — I878 Other specified disorders of veins: Secondary | ICD-10-CM | POA: Diagnosis not present

## 2018-09-03 DIAGNOSIS — Z79899 Other long term (current) drug therapy: Secondary | ICD-10-CM

## 2018-09-03 DIAGNOSIS — Z7901 Long term (current) use of anticoagulants: Secondary | ICD-10-CM | POA: Diagnosis not present

## 2018-09-03 DIAGNOSIS — L03116 Cellulitis of left lower limb: Secondary | ICD-10-CM | POA: Diagnosis not present

## 2018-09-03 DIAGNOSIS — Z8619 Personal history of other infectious and parasitic diseases: Secondary | ICD-10-CM | POA: Diagnosis not present

## 2018-09-03 DIAGNOSIS — L03115 Cellulitis of right lower limb: Secondary | ICD-10-CM | POA: Diagnosis not present

## 2018-09-03 DIAGNOSIS — L03119 Cellulitis of unspecified part of limb: Secondary | ICD-10-CM

## 2018-09-03 MED ORDER — PENICILLIN V POTASSIUM 500 MG PO TABS: 500.0000 mg | ORAL_TABLET | Freq: Two times a day (BID) | ORAL | 3 refills | Status: DC

## 2018-09-03 NOTE — Progress Notes (Signed)
NAME: Arisha Gervais  DOB: 11/29/1923  MRN: [id number]  Date/Time: 09/03/2018 11:23 AM    85 yr female with h/o B/l venous/lymphedema legs, Afib, Cidd infection, recurrent cellulitis, bacteremia X  2    Follow up visit for left leg cellulitis  The purpose of this virtual visit is to provide medical care while limiting exposure to the novel coronavirus (COVID19) for both patient and office staff.   Consent was obtained for TELEVIDEO visit:  Yes.   Answered questions that patient had about telehealth interaction:  Yes.   I discussed the limitations, risks, security and privacy concerns of performing an evaluation and management service by telephone. I also discussed with the patient that there may be a patient responsible charge related to this service. The patient expressed understanding and agreed to proceed.   Patient Location: [facility] [address]-( home) Daughter was in the visit Provider Location:office ?85 yr female with h/o B/l venous/lymphedema legs, Afib, Cidd infection, recurrent cellulitis, bacteremia X  2  Pt was recently in hospital between 06/26/18-07/01/18 for left leg cellulitis. She was discharged home on oral Penicillin suppressive therapy indefinitely because of recurrent cellulitis associated with 2 prior episodes of strep bacteremia ( G and B)   She used to live in [place] and moved to [place] 2 years ago.  July 2019 she had an episode of cellulitis along with group G streptococcal bacteremia and was hospitalized.  She was treated with IV antibiotics and then oral antibiotic.  She then developed C. difficile diarrhea.  She had to have fecal transplant in December as oral therapy did not help.  In January she had another episode of cellulitis with group B streptococcus bacteremia.  C. difficile recurred.  She had to undergo another fecal transplant on May 31, 2018.  She has been on fidaxomicin since then intermittently.  Pt says she has been doing well since discharge.  She has been compliant with penicillin. She has no redness of the legs, no fever, She has no diarrhea . Had for 3 days a few weeks ago and took fidoxamicin for 3 days and it resolved. She is followed by Dr.VAnga GI.  She keeps her legs elevated a lot but is not wearing compression stockings as she finds it difficult to put on. She lives at [facility] . Daughter helps her  Past Medical History:  Diagnosis Date  . (HFpEF) heart failure with preserved ejection fraction ([place])   . Anemia   . Atrial fibrillation, permanent    on Xarelto  . Bilateral lower extremity edema   . Bilateral lower leg cellulitis 11/11/2017  . Cancer [facility])    breast cancer at age 42 and skin  . Cellulitis    lower abdomen  . Diarrhea 01/11/2018  . Hyperlipidemia   . Hypertension   . Incontinence   . Low sodium levels   . Macular degeneration   . Malnutrition of moderate degree 11/23/2017    Past Surgical History:  Procedure Laterality Date  . BREAST SURGERY     right mastectomy  . COLONOSCOPY WITH PROPOFOL N/A 05/31/2018   Procedure: COLONOSCOPY WITH PROPOFOL;  Surgeon: Lin Landsman, MD;  Location: [facility] ENDOSCOPY;  Service: Gastroenterology;  Laterality: N/A;  . EYE SURGERY     x3  . 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 FECAL TRANSPLANT N/A 05/31/2018   Procedure: FECAL TRANSPLANT;  Surgeon: Marius Ditch,  Tally Due, MD;  Location: [place];  Service: Gastroenterology;  Laterality: N/A;  . IR RADIOLOGIST EVAL & MGMT  09/17/2017  . MASTECTOMY     right  . SHOULDER OPEN ROTATOR CUFF REPAIR     left  . SKIN BIOPSY     skin cancer     SH -Non smoker  Family History  Problem Relation Age of Onset  . Hypertension Mother    No Known Allergies Current Outpatient Medications  Medication Sig Dispense Refill  . acetaminophen (TYLENOL) 325 MG tablet Take 2 tablets (650  mg total) by mouth every 6 (six) hours as needed for mild pain (or Fever >/= 101).    Marland Kitchen acidophilus (RISAQUAD) CAPS capsule Take 1 capsule by mouth daily. 60 capsule 0  . albuterol (PROVENTIL HFA;VENTOLIN HFA) 108 (90 Base) MCG/ACT inhaler Inhale 2 puffs into the lungs every 6 (six) hours as needed for wheezing or shortness of breath.    Marland Kitchen atorvastatin (LIPITOR) 10 MG tablet Take 1 tablet (10 mg total) by mouth daily. 90 tablet 3  . brimonidine (ALPHAGAN) 0.2 % ophthalmic solution Place 1 drop into both eyes 3 (three) times daily. Taking BID per patient  6  . calcium carbonate (OSCAL) 1500 (600 Ca) MG TABS tablet Take by mouth 2 (two) times daily with a meal.    . diphenhydrAMINE HCl, Sleep, 25 MG CAPS Take 25-50 mg by mouth at bedtime as needed (sleep).    . dorzolamide (TRUSOPT) 2 % ophthalmic solution Place 1 drop into both eyes 3 (three) times daily.  6  . ferrous sulfate 325 (65 FE) MG EC tablet Take 325 mg by mouth daily with breakfast.    . fidaxomicin (DIFICID) 200 MG TABS tablet Take 1 tablet (200 mg total) by mouth daily. 1 po bid for 5 days, then 1 po qod for 3 months 180 tablet 0  . furosemide (LASIX) 20 MG tablet TAKE 1 TABLET(20 MG) BY MOUTH EVERY OTHER DAY 45 tablet 2  . metoprolol succinate (TOPROL-XL) 100 MG 24 hr tablet Take 1 tablet (100 mg total) by mouth daily. Take with or immediately following a meal. 90 tablet 3  . Multiple Vitamins-Minerals (MULTIVITAMIN ADULT) TABS Take 1 tablet by mouth daily.    . Nutritional Supplements (NUTRITIONAL SHAKE PLUS PROTEIN PO) Take by mouth.    . penicillin v potassium (VEETID) 500 MG tablet Take 1 tablet (500 mg total) by mouth 2 (two) times a day. 60 tablet 1  . Rivaroxaban (XARELTO) 15 MG TABS tablet Take 1 tablet (15 mg total) by mouth daily. 90 tablet 3  . sodium chloride 1 g tablet Take 1 tablet (1 g total) by mouth 2 (two) times daily with a meal. 180 tablet 0  . TRAVATAN Z 0.004 % SOLN ophthalmic solution Place 1 drop into both eyes  at bedtime.   5  . vitamin C (ASCORBIC ACID) 500 MG tablet Take 500 mg by mouth daily.     No current facility-administered medications for this visit.      Abtx:  Anti-infectives (From admission, onward)   None      REVIEW OF SYSTEMS:  Const: negative fever, negative chills, negative weight loss Eyes: negative diplopia or visual changes, negative eye pain ENT: negative coryza, negative sore throat Resp: negative cough, hemoptysis, dyspnea Cards: negative for chest pain, palpitations, lower extremity edema GU: negative for frequency, dysuria and hematuria GI: Negative for abdominal pain, diarrhea, bleeding, constipation Skin: negative for rash and pruritus Heme: negative for  easy bruising and gum/nose bleeding MS: generalized weakness Neurolo:negative for headaches, dizziness, vertigo, memory problems  Psych: negative for feelings of anxiety, depression  Endocrine: negative for thyroid, diabetes Allergy/Immunology- negative for any medication or food allergies ?  Objective:  VITALS:  There were no vitals taken for this visit. PHYSICAL EXAM:  General: Alert, cooperative, no distress, appears stated age.  Head: Normocephalic, without obvious abnormality, atraumatic. Eyes: ENT,Neck:Lungs: Heart: Abdomen: not examined  Extremities: edema legs rt > left No erythema or blisters Skin: No rashes or lesions. Or bruising Lymph: cannot examine. Neurologic: Grossly non-focal Pertinent Labs none ? Impression/Recommendation ? 85 year old female with history of bilateral edema of the legs with recurrent cellulitis and history of streptococcal group G bacteremia.  Recurrent Cellulitis of the the lower extremities - resolved -due to venous edema and athlete's foot. Doing very well on suppressive penicillin 500mg  BID therapy.she will take this indefinitely Also on probiotic Encouraged patient to wear compression stockings or ACE wrap, Keep leg elevated  Recurrent Cdiff- quiescent,  s/p 2 fecal transplant Aware that antibiotics can cause recurrence- she has fidoxamicin given by Dr.VAnga and uses it if needed  Atrial fibrillation on metoprolol and Xarelto? ? Discussed with patient, and her daughter in detail and answered their questions

## 2018-09-26 ENCOUNTER — Telehealth: Payer: Self-pay

## 2018-09-26 NOTE — Telephone Encounter (Signed)
Spoken to patient. She stated that she has been lock down at nursing home for the last 60 days. Before all this patient was getting some PT. It was really helping patient. Patient was getting PT through [facility]. She asked if Anda Kraft can put in a referral for PT so she can start on it again soon.

## 2018-09-26 NOTE — Telephone Encounter (Signed)
Best number [phone]  Pt returned your call

## 2018-09-26 NOTE — Telephone Encounter (Signed)
Pt left v/m wanting to know if had option for rx to receive  PT.unable to reach pt by phone.

## 2018-09-26 NOTE — Telephone Encounter (Signed)
Pt called to check msg was sent. I confirmed # and advised her I would send note back. She is aware Vallarie Mare is seeing patients.

## 2018-09-26 NOTE — Telephone Encounter (Signed)
Did she try contacting [facility] PT services to ask? If so, what did they say? If she didn't complete her PT with them prior to Covid-19 she may not need another referral.  Rosaria Ferries, can you see where she stands with this?

## 2018-09-27 NOTE — Telephone Encounter (Signed)
Noted and appreciate the follow up. Thanks!

## 2018-09-27 NOTE — Telephone Encounter (Signed)
[facility] just called me back after reaching out to [place] the Transport planner at [facility]. Rip Harbour does not feel that the patient has declined enough to qualify for more HH PT. As far as Outpatient PT goes, she would have a $35 co-pay and that is the reason they think she wants to do the [facility] PT as there is no co-pay for [facility]. The Therapist is going to reach out to the patients daughter to discuss this with her.

## 2018-09-27 NOTE — Telephone Encounter (Signed)
So sorry. Patient did contact [facility] and her insurance. She was told to contact primary care provider to place a new referral.

## 2018-09-27 NOTE — Telephone Encounter (Signed)
Noted  

## 2018-09-27 NOTE — Telephone Encounter (Signed)
Called [facility]. Patient met her maximum potential on 07/31/18 for the PT that they provided her. The plan was supposed to be that she transition to Outpatient PT from her facility at [facility]. Marlowe Kays is going to call the Therapist at [facility] to see if patient can get services from them using her Part B benefits. If not then Anda Kraft would need to see the patient for a New face to face visit to qualify her again for the [facility] PT and then put a [facility] Referral in. Marlowe Kays will call me back after talking to [facility].Also due to the Covid virus patients are not allowed to walk the halls for exercise so that makes it even more difficult to walk around.

## 2018-09-30 ENCOUNTER — Other Ambulatory Visit: Payer: Self-pay

## 2018-09-30 DIAGNOSIS — L570 Actinic keratosis: Secondary | ICD-10-CM | POA: Diagnosis not present

## 2018-09-30 DIAGNOSIS — D040 Carcinoma in situ of skin of lip: Secondary | ICD-10-CM | POA: Diagnosis not present

## 2018-09-30 MED ORDER — RIVAROXABAN 15 MG PO TABS: 15.0000 mg | ORAL_TABLET | Freq: Every day | ORAL | 3 refills | Status: DC

## 2018-10-01 NOTE — Telephone Encounter (Signed)
Message left for patient to return my call.  

## 2018-10-01 NOTE — Telephone Encounter (Signed)
Patient called and left a message on the nurse line today asking for Heather Larson to call her back with an update on this situation.   I will forward to [place], CMA to follow up with patient.

## 2018-10-02 NOTE — Telephone Encounter (Signed)
Spoken to patient and given the information below. Patient stated that she will speak to her daughter and Rip Harbour.

## 2018-10-15 ENCOUNTER — Telehealth: Payer: Self-pay | Admitting: Family

## 2018-10-15 DIAGNOSIS — H353213 Exudative age-related macular degeneration, right eye, with inactive scar: Secondary | ICD-10-CM | POA: Diagnosis not present

## 2018-10-15 DIAGNOSIS — H353122 Nonexudative age-related macular degeneration, left eye, intermediate dry stage: Secondary | ICD-10-CM | POA: Diagnosis not present

## 2018-10-15 NOTE — Telephone Encounter (Signed)
Received phone call from patient's daughter, Shauna Hugh, regarding patient's continued weight loss. She says that she normally weighs ~ 128 pounds and yesterday she was down to 121 pounds. [name] says that patient has never been a big eater but that her appetite hasn't really changed.   She's been having quite a few incontinent issues and has gone through ~ 100 pads in the last 10 days. She is currently taking furosemide 20mg  every other day. Has minimal edema in her legs and no wheezing.   Discussed using the furosemide purely as needed but [name] isn't sure her mom would notice an increase in her swelling because of her eyesight and [name] doesn't see her mom daily. Discussed using a lower dose a few times a week if that would work better as [name] does her mom's pill box for her. [name] says that she will discuss these options with her mom tonight and let us know what she thinks will work best for her.   Also advised her to make sure that her PCP is aware of continued weight loss and [name] says that she's been keeping them up to date and recently sent them a mychart message.

## 2018-10-16 ENCOUNTER — Encounter: Payer: Self-pay | Admitting: Family

## 2018-10-16 ENCOUNTER — Ambulatory Visit: Payer: Medicare HMO | Admitting: Gastroenterology

## 2018-10-25 ENCOUNTER — Ambulatory Visit (INDEPENDENT_AMBULATORY_CARE_PROVIDER_SITE_OTHER): Payer: Medicare HMO | Admitting: Primary Care

## 2018-10-25 VITALS — BP 129/60 | HR 64 | Wt 125.0 lb

## 2018-10-25 DIAGNOSIS — R21 Rash and other nonspecific skin eruption: Secondary | ICD-10-CM

## 2018-10-25 MED ORDER — PREDNISONE 10 MG PO TABS: ORAL_TABLET | ORAL | 0 refills | Status: DC

## 2018-10-25 NOTE — Assessment & Plan Note (Signed)
Obvious allergic type rash to upper extremity. No signs of cellulitis and doesn't appear to be erythema migrans. Possibly poison ivy dermatitis. Rx for low dose prednisone course sent to pharmacy. She will update if no improvement in 2-3 days.

## 2018-10-25 NOTE — Progress Notes (Signed)
Subjective:    Patient ID: Heather Larson, female    DOB: [birth year], 85 y.o.   MRN: [id number]  HPI  Virtual Visit via Video Note  I connected with Heather Larson on 10/25/18 at  2:40 PM EDT by a video enabled telemedicine application and verified that I am speaking with the correct person using two identifiers.  Location: Patient: Home Provider: Office   I discussed the limitations of evaluation and management by telemedicine and the availability of in person appointments. The patient expressed understanding and agreed to proceed.  History of Present Illness:  Heather Larson is a 85 year old female with a history of c-diff, recurrent colitis, recurrent cellulitis, CHF, Lymphedema, anemia who presents today with a a chief complaint of pruritis and rash.  Her itching and rash are located to the right posterior humeral region of her upper extremity for which she first noticed 3 days ago. Her daughter endorses that the arm is rosy pink and is also warm to the touch. She denies fevers, chills, tick bites, known exposure to poison ivy. She denies changes in soaps/detergents/medications. She is compliant to her penicillin daily as prescribed.   She's applied an antihistamine cream with temporary improvement. She was outdoors on Sunday last week at her daughters house. She sat in the yard and patted the dog.   Observations/Objective:  Alert and oriented. Appears well, not sickly. No distress. Speaking in complete sentences.  Large circular rash with several smaller blisters to right posterior upper extremity. No central clearing, no open wounds.  Assessment and Plan:  Obvious allergic type rash to upper extremity. No signs of cellulitis and doesn't appear to be erythema migrans. Possibly poison ivy dermatitis. Rx for low dose prednisone course sent to pharmacy. She will update if no improvement in 2-3 days.  Follow Up Instructions:  Start prednisone tablets for rash. Take three  tablets for 2 days, then two tablets for 2 days, then one tablet for 2 days.  Please update me in 2-3 days if no improvement.  It was a pleasure to see you today!   I discussed the assessment and treatment plan with the patient. The patient was provided an opportunity to ask questions and all were answered. The patient agreed with the plan and demonstrated an understanding of the instructions.   The patient was advised to call back or seek an in-person evaluation if the symptoms worsen or if the condition fails to improve as anticipated.     Pleas Koch, NP    Review of Systems  Constitutional: Negative for fever.  Respiratory: Negative for shortness of breath.   Skin: Positive for rash. Negative for wound.       Past Medical History:  Diagnosis Date  . (HFpEF) heart failure with preserved ejection fraction ([place])   . Anemia   . Atrial fibrillation, permanent    on Xarelto  . Bilateral lower extremity edema   . Bilateral lower leg cellulitis 11/11/2017  . Cancer [facility])    breast cancer at age 88 and skin  . Cellulitis    lower abdomen  . Diarrhea 01/11/2018  . Hyperlipidemia   . Hypertension   . Incontinence   . Low sodium levels   . Macular degeneration   . Malnutrition of moderate degree 11/23/2017     Social History   Socioeconomic History  . Marital status: Divorced    Spouse name: Not on file  . Number of children: Not on file  . Years of education:  Not on file  . Highest education level: Not on file  Occupational History  . Occupation: retired  Scientific laboratory technician  . Financial resource strain: Not on file  . Food insecurity    Worry: Not on file    Inability: Not on file  . Transportation needs    Medical: Not on file    Non-medical: Not on file  Tobacco Use  . Smoking status: Never Smoker  . Smokeless tobacco: Never Used  Substance and Sexual Activity  . Alcohol use: Not Currently    Alcohol/week: 2.0 standard drinks    Types: 2 Shots of liquor  per week    Comment: 2 drinks daily  . Drug use: No  . Sexual activity: Not Currently  Lifestyle  . Physical activity    Days per week: Not on file    Minutes per session: Not on file  . Stress: Not on file  Relationships  . Social Herbalist on phone: Not on file    Gets together: Not on file    Attends religious service: Not on file    Active member of club or organization: Not on file    Attends meetings of clubs or organizations: Not on file    Relationship status: Not on file  . Intimate partner violence    Fear of current or ex partner: Not on file    Emotionally abused: Not on file    Physically abused: Not on file    Forced sexual activity: Not on file  Other Topics Concern  . Not on file  Social History Narrative   Moved from [place].    Past Surgical History:  Procedure Laterality Date  . BREAST SURGERY     right mastectomy  . COLONOSCOPY WITH PROPOFOL N/A 05/31/2018   Procedure: COLONOSCOPY WITH PROPOFOL;  Surgeon: Lin Landsman, MD;  Location: [facility] ENDOSCOPY;  Service: Gastroenterology;  Laterality: N/A;  . EYE SURGERY     x3  . 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 FECAL TRANSPLANT N/A 05/31/2018   Procedure: FECAL TRANSPLANT;  Surgeon: Lin Landsman, MD;  Location: [facility] ENDOSCOPY;  Service: Gastroenterology;  Laterality: N/A;  . IR RADIOLOGIST EVAL & MGMT  09/17/2017  . MASTECTOMY     right  . SHOULDER OPEN ROTATOR CUFF REPAIR     left  . SKIN BIOPSY     skin cancer    Family History  Problem Relation Age of Onset  . Hypertension Mother     No Known Allergies  Current Outpatient Medications on File Prior to Visit  Medication Sig Dispense Refill  . acetaminophen (TYLENOL) 325 MG tablet Take 2 tablets (650 mg total) by mouth every 6 (six) hours as needed for mild pain (or Fever >/= 101).    Marland Kitchen  acidophilus (RISAQUAD) CAPS capsule Take 1 capsule by mouth daily. 60 capsule 0  . albuterol (PROVENTIL HFA;VENTOLIN HFA) 108 (90 Base) MCG/ACT inhaler Inhale 2 puffs into the lungs every 6 (six) hours as needed for wheezing or shortness of breath.    Marland Kitchen atorvastatin (LIPITOR) 10 MG tablet Take 1 tablet (10 mg total) by mouth daily. 90 tablet 3  . brimonidine (ALPHAGAN) 0.2 % ophthalmic solution Place 1 drop into both eyes 3 (three) times daily. Taking BID per patient  6  . calcium carbonate (OSCAL) 1500 (600 Ca) MG TABS tablet Take  by mouth 2 (two) times daily with a meal.    . diphenhydrAMINE HCl, Sleep, 25 MG CAPS Take 25-50 mg by mouth at bedtime as needed (sleep).    . dorzolamide (TRUSOPT) 2 % ophthalmic solution Place 1 drop into both eyes 3 (three) times daily.  6  . ferrous sulfate 325 (65 FE) MG EC tablet Take 325 mg by mouth daily with breakfast.    . fidaxomicin (DIFICID) 200 MG TABS tablet Take 1 tablet (200 mg total) by mouth daily. 1 po bid for 5 days, then 1 po qod for 3 months 180 tablet 0  . furosemide (LASIX) 20 MG tablet TAKE 1 TABLET(20 MG) BY MOUTH EVERY OTHER DAY 45 tablet 2  . metoprolol succinate (TOPROL-XL) 100 MG 24 hr tablet Take 1 tablet (100 mg total) by mouth daily. Take with or immediately following a meal. 90 tablet 3  . Multiple Vitamins-Minerals (MULTIVITAMIN ADULT) TABS Take 1 tablet by mouth daily.    . Nutritional Supplements (NUTRITIONAL SHAKE PLUS PROTEIN PO) Take by mouth.    . penicillin v potassium (VEETID) 500 MG tablet Take 1 tablet (500 mg total) by mouth 2 (two) times a day. 60 tablet 3  . Rivaroxaban (XARELTO) 15 MG TABS tablet Take 1 tablet (15 mg total) by mouth daily. 90 tablet 3  . sodium chloride 1 g tablet Take 1 tablet (1 g total) by mouth 2 (two) times daily with a meal. 180 tablet 0  . TRAVATAN Z 0.004 % SOLN ophthalmic solution Place 1 drop into both eyes at bedtime.   5  . vitamin C (ASCORBIC ACID) 500 MG tablet Take 500 mg by mouth daily.      No current facility-administered medications on file prior to visit.     BP 129/60   Pulse 64   Wt 125 lb (56.7 kg)   BMI 22.86 kg/m    Objective:   Physical Exam  Constitutional: She is oriented to person, place, and time. She appears well-nourished.  Respiratory: Effort normal. No respiratory distress.  Neurological: She is alert and oriented to person, place, and time.  Skin: Skin is dry. Rash noted.  Large circular rash with several smaller blisters to right posterior upper extremity. No central clearing, no open wounds.           Assessment & Plan:

## 2018-10-25 NOTE — Patient Instructions (Signed)
Start prednisone tablets for rash. Take three tablets for 2 days, then two tablets for 2 days, then one tablet for 2 days.  Please update me in 2-3 days if no improvement.  It was a pleasure to see you today!

## 2018-10-28 DIAGNOSIS — M6281 Muscle weakness (generalized): Secondary | ICD-10-CM | POA: Diagnosis not present

## 2018-10-28 DIAGNOSIS — R2689 Other abnormalities of gait and mobility: Secondary | ICD-10-CM | POA: Diagnosis not present

## 2018-11-04 ENCOUNTER — Other Ambulatory Visit: Payer: Self-pay

## 2018-11-04 ENCOUNTER — Ambulatory Visit: Payer: Medicare HMO | Admitting: Gastroenterology

## 2018-11-04 ENCOUNTER — Encounter: Payer: Self-pay | Admitting: Gastroenterology

## 2018-11-04 VITALS — BP 152/82 | HR 77 | Temp 98.6°F | Resp 17 | Ht 62.0 in | Wt 125.6 lb

## 2018-11-04 DIAGNOSIS — D509 Iron deficiency anemia, unspecified: Secondary | ICD-10-CM | POA: Diagnosis not present

## 2018-11-04 DIAGNOSIS — A0471 Enterocolitis due to Clostridium difficile, recurrent: Secondary | ICD-10-CM

## 2018-11-04 MED ORDER — FIDAXOMICIN 200 MG PO TABS: ORAL_TABLET | ORAL | 2 refills | Status: DC

## 2018-11-04 NOTE — Progress Notes (Signed)
Cephas Darby, MD [address]  [place]  [place], [place] [address]  Main: [phone]  Fax: [phone]    Gastroenterology Consultation.  Referring Provider:     Pleas Koch, NP Primary Care Physician:  Pleas Koch, NP Primary Gastroenterologist:  Dr. Cephas Darby Reason for Consultation:    Recurrent Clostridium difficile colitis         HPI:   Heather Larson is a 85 y.o. Larson s seen as a hospital follow-up. Patient was recently discharged from [facility] after being treated for bilateral lower extremity cellulitis, treated with antibiotics and later developed C. Difficile colitis. She was treated with oral vancomycin and IV metronidazole. Discharged home on 2 weeks course of oral vancomycin.   Interval summary: Patient is accompanied by her daughter today who is very involved in her care. Patient's diarrhea has resolved. Currently having one to 2 formed bowel movements daily and sometimes experiences gurgling in her stomach.overall, her energy levels are significantly better, able to socialize with her friends and more active. She started taking iron every other day as recommended by her primary care provider because of microcytic anemia. Patient's hemoglobin was 11.9 in 03/2017 with normal MCV. And her hemoglobin has declined in last 1 month when she was admitted to [facility] with cellulitis and C. Difficile. Her hemoglobin nadir was 8.2, MCV 77 and reactive thrombocytosis. Patient denies having black tarry stools or rectal bleeding. She did have some bloody diarrhea and she has C. Difficile colitis. Her hemoglobin after discharge is 9.3 on 12/05/2017.  Follow-up visit in 01/29/2018 She was readmitted to [facility] about 2 weeks ago secondary to recurrence of C. Difficile.  Her WBC count at that time was 43,000, she was started on oral vancomycin 125 MCG 4 times daily.  During the hospital stay, her WBC count improved  to 13,000.  She was discharged home on oral vancomycin, her stools are more formed at the time of discharge.  Her diarrhea has resolved at this time.  She continues to take oral vancomycin 4 times daily which is her week 3.  She reports abdominal bloating.  She is taking acidophilus along with antibiotics.  She is also taking oral iron once a day.  She reports that she feels well otherwise  Follow-up visit 02/21/2018 Patient denies any complaints of diarrhea.  She has abdominal bloating, sometimes her stools are soft, on average 2 times daily.  Nonbloody, appetite is intact.  She is taking oral iron daily.  Currently, on vancomycin taper 1 pill 2-3 times every week.  Patient is accompanied by her daughter today.  Recent blood work shows that her hemoglobin is improving  Follow-up visit 04/02/18 Patient had second recurrence of C. Difficile, confirmed on 03/15/2018.  She had elevated leukocytosis, 20.3K.  She was started on oral vancomycin 125mg  4 times daily for 2 weeks.  Patient finished the course on Friday last week. Leukocytosis resolved on repeat labs on 03/26/2018 at cardiologist's office.  Currently, she reports having 1-2 semi-formed, nonbloody bowel movements daily.  She does have abdominal bloating.  She is taking probiotics as well as oral iron 1 pill daily.  Her hemoglobin has been stable.  Patient is accompanied by her daughter today.  Follow-up visit 05/24/2018 Since last visit, patient underwent FMT by Dr. Bonna Gains on 05/03/2018 when he was on vacation.  Procedure went well.  Unfortunately, patient developed cellulitis in her bilateral lower extremities and lower abdomen requiring hospital admission  from 05/08/2018 to 05/12/2018.  She received IV antibiotics and was discharged home on 5 days of amoxicillin.  Her cellulitis has completely resolved.  But for the last 5 days, patient developed recurrence of nonbloody diarrhea causing fecal incontinence associated with lower abdominal cramps,  temperature 100, decreased appetite.  She is overwhelmed with his ongoing symptoms as it has disrupted her quality of life.  She is not able to spend time with her friends at assisted living facility.  She is restricted to her apartment which she does not like it.  Cellulitis has almost resolved, finished antibiotic course, swelling of legs has improved  Follow-up visit 07/09/2018 Patient underwent second FMT on 05/31/2018 successfully.  Unfortunately, she had another episode of cellulitis with significant leukocytosis for which she was admitted to [facility] in last week of February.  Infectious disease was consulted and recommendation was made that she should be on a prophylactic antibiotic as she had recurrent cellulitis and she continued to stay on Dificid during the hospitalization to prevent recurrence of C. difficile and discharged home on Dificid along with penicillin.  She is accompanied by her daughter today.  She feels great.  She reports her stools are formed.  She denies any GI symptoms today.  Reports that she is back to her normal life and very much pleased. She is currently on Dificid 1 pill a day  Follow-up visit 11/04/2018 Patient discontinued Dificid about 2 months ago although I suggested her to take Dificid every other day as long as she is on antibiotics.  She continues to take penicillin for recurrent cellulitis.  She is currently having 1-2 formed bowel movements daily.  Continues to have significant abdominal distention and sometimes frequent gurgling in her stomach.  She lost about 3 to 4 pounds and her Lasix dose has been reduced by her cardiologist as it was felt to be secondary to fluid loss.  Otherwise, she denies any concerns.  Patient is accompanied by her daughter today  NSAIDs: none  Antiplts/Anticoagulants/Anti thrombotics: none  GI Procedures:  Colonoscopy 05/03/2018 - Diverticulosis in the sigmoid colon. - Fecal Microbiota Transplant (Bacteriotherapy) performed in the  cecum. - No specimens collected.  Past Medical History:  Diagnosis Date   (HFpEF) heart failure with preserved ejection fraction ([facility])    Anemia    Atrial fibrillation, permanent    on Xarelto   Bilateral lower extremity edema    Bilateral lower leg cellulitis 11/11/2017   Cancer ([place])    breast cancer at age 62 and skin   Cellulitis    lower abdomen   Diarrhea 01/11/2018   Hyperlipidemia    Hypertension    Incontinence    Low sodium levels    Macular degeneration    Malnutrition of moderate degree 11/23/2017    Past Surgical History:  Procedure Laterality Date   BREAST SURGERY     right mastectomy   COLONOSCOPY WITH PROPOFOL N/A 05/31/2018   Procedure: COLONOSCOPY WITH PROPOFOL;  Surgeon: Lin Landsman, MD;  Location: [facility] ENDOSCOPY;  Service: Gastroenterology;  Laterality: N/A;   EYE SURGERY     x3   FECAL TRANSPLANT N/A 05/03/2018   Procedure: FECAL TRANSPLANT;  Surgeon: Virgel Manifold, MD;  Location: [facility] ENDOSCOPY;  Service: Gastroenterology;  Laterality: N/A;   FECAL TRANSPLANT N/A 05/31/2018   Procedure: FECAL TRANSPLANT;  Surgeon: Lin Landsman, MD;  Location: [facility] ENDOSCOPY;  Service: Gastroenterology;  Laterality: N/A;   IR RADIOLOGIST EVAL & MGMT  09/17/2017   MASTECTOMY  right   SHOULDER OPEN ROTATOR CUFF REPAIR     left   SKIN BIOPSY     skin cancer    Current Outpatient Medications:    acetaminophen (TYLENOL) 325 MG tablet, Take 2 tablets (650 mg total) by mouth every 6 (six) hours as needed for mild pain (or Fever >/= 101)., Disp: , Rfl:    acidophilus (RISAQUAD) CAPS capsule, Take 1 capsule by mouth daily., Disp: 60 capsule, Rfl: 0   atorvastatin (LIPITOR) 10 MG tablet, Take 1 tablet (10 mg total) by mouth daily., Disp: 90 tablet, Rfl: 3   brimonidine (ALPHAGAN) 0.2 % ophthalmic solution, Place 1 drop into both eyes 3 (three) times daily. Taking BID per patient, Disp: , Rfl: 6   calcium carbonate (OSCAL) 1500  (600 Ca) MG TABS tablet, Take by mouth 2 (two) times daily with a meal., Disp: , Rfl:    diphenhydrAMINE HCl, Sleep, 25 MG CAPS, Take 25-50 mg by mouth at bedtime as needed (sleep)., Disp: , Rfl:    dorzolamide (TRUSOPT) 2 % ophthalmic solution, Place 1 drop into both eyes 3 (three) times daily., Disp: , Rfl: 6   ferrous sulfate 325 (65 FE) MG EC tablet, Take 325 mg by mouth daily with breakfast., Disp: , Rfl:    furosemide (LASIX) 20 MG tablet, TAKE 1 TABLET(20 MG) BY MOUTH EVERY OTHER DAY, Disp: 45 tablet, Rfl: 2   metoprolol succinate (TOPROL-XL) 100 MG 24 hr tablet, Take 1 tablet (100 mg total) by mouth daily. Take with or immediately following a meal., Disp: 90 tablet, Rfl: 3   Multiple Vitamins-Minerals (MULTIVITAMIN ADULT) TABS, Take 1 tablet by mouth daily., Disp: , Rfl:    Nutritional Supplements (NUTRITIONAL SHAKE PLUS PROTEIN PO), Take by mouth., Disp: , Rfl:    penicillin v potassium (VEETID) 500 MG tablet, Take 1 tablet (500 mg total) by mouth 2 (two) times a day., Disp: 60 tablet, Rfl: 3   Rivaroxaban (XARELTO) 15 MG TABS tablet, Take 1 tablet (15 mg total) by mouth daily., Disp: 90 tablet, Rfl: 3   sodium chloride 1 g tablet, Take 1 tablet (1 g total) by mouth 2 (two) times daily with a meal., Disp: 180 tablet, Rfl: 0   TRAVATAN Z 0.004 % SOLN ophthalmic solution, Place 1 drop into both eyes at bedtime. , Disp: , Rfl: 5   vitamin C (ASCORBIC ACID) 500 MG tablet, Take 500 mg by mouth daily., Disp: , Rfl:    albuterol (PROVENTIL HFA;VENTOLIN HFA) 108 (90 Base) MCG/ACT inhaler, Inhale 2 puffs into the lungs every 6 (six) hours as needed for wheezing or shortness of breath., Disp: , Rfl:    fidaxomicin (DIFICID) 200 MG TABS tablet, Take 1 pill two times a day on mon, wed, fri, Disp: 60 tablet, Rfl: 2   imiquimod (ALDARA) 5 % cream, , Disp: , Rfl:    metoprolol succinate (TOPROL-XL) 50 MG 24 hr tablet, , Disp: , Rfl:    predniSONE (DELTASONE) 10 MG tablet, Take three  tablets for 2 days, then two tablets for 2 days, then one tablet for 2 days. (Patient not taking: Reported on 11/04/2018), Disp: 12 tablet, Rfl: 0  Family History  Problem Relation Age of Onset   Hypertension Mother      Social History   Tobacco Use   Smoking status: Never Smoker   Smokeless tobacco: Never Used  Substance Use Topics   Alcohol use: Not Currently    Alcohol/week: 2.0 standard drinks    Types: 2 Shots  of liquor per week    Comment: 2 drinks daily   Drug use: No    Allergies as of 11/04/2018   (No Known Allergies)    Review of Systems:    All systems reviewed and negative except where noted in HPI.   Physical Exam:  BP (!) 152/82 (BP Location: Left Arm, Patient Position: Sitting, Cuff Size: Normal)    Pulse 77    Temp 98.6 F (37 C)    Resp 17    Ht 5\' 2"  (1.575 m)    Wt 125 lb 9.6 oz (57 kg)    BMI 22.97 kg/m  No LMP recorded. Patient is postmenopausal.  General:   Alert, well-built, appropriate for her age, pleasant and cooperative in NAD Head:  Normocephalic and atraumatic. Eyes:  Sclera clear, no icterus.   Conjunctiva pink. Ears:  Normal auditory acuity. Nose:  No deformity, discharge, or lesions. Mouth:  No deformity or lesions,oropharynx pink & moist. Neck:  Supple; no masses or thyromegaly. Lungs:  Respirations even and unlabored.  Clear throughout to auscultation.   No wheezes, crackles, or rhonchi. No acute distress. Heart:  Regular rate and rhythm; no murmurs, clicks, rubs, or gallops. Abdomen:  Normal bowel sounds. Soft, non-tender and moderately distended, tympanic to percussion, without masses, hepatosplenomegaly or hernias noted.  No guarding or rebound tenderness.   Rectal: Not performed Msk:  Symmetrical without gross deformities. Good, equal movement & strength bilaterally. Pulses:  Normal pulses noted. Extremities:  No clubbing or edema.  No cyanosis. Neurologic:  Alert and oriented x3;  grossly normal neurologically. Skin:  Intact  without significant lesions or rashes. No jaundice, 3+ nonpitting edema likely secondary to venous stasis. Psych:  Alert and cooperative. Normal mood and affect.  Imaging Studies: No abdominal imaging  Assessment and Plan:   Camika Marsico is a 85 y.o. Caucasian Larson with history of A. Fib on rivaroxaban, hypertension, admission to [facility] in 10/2017 with lower extremity cellulitis and subsequently developed C. Difficile in the hospital, followed by multiple recurrences, treated with oral vancomycin, fidaxomicin and underwent FMT on 05/13/2018 followed by recurrence of cellulitis after FMT requiring IV and oral antibiotics.  This has resulted in recurrence of C. difficile, underwent second FM T on 05/31/2018.  She had another recurrence of cellulitis in 06/2018.  She is currently on prophylactic long-term antibiotics to prevent recurrence of cellulitis.  My recommendation is that she should stay on fidaxomicin to prevent recurrence of C. difficile as long as she stays on other antibiotics for any other systemic infections.  Recommended her to take fidaxomicin to 1 pill every other day.  Advised her that she can increase the dose to once a day or 2 times a day for 3 to 5 days if her diarrhea worsens, then go back to once a day or 1 pill every other day.  Her diarrhea has currently resolved.  She will follow-up with Dr. Steva Ready for cellulitis.   Microcytic anemia Recheck CBC, iron studies, B12 and folate levels   Follow up in 6 months  Cephas Darby, MD

## 2018-11-05 LAB — FOLATE: Folate: >20 ng/mL (ref >3.0)

## 2018-11-05 LAB — CBC
Hematocrit: 38.9 % (ref 34.0–46.6)
Hemoglobin: 12.6 g/dL (ref 11.1–15.9)
MCH: 27.6 pg (ref 26.6–33.0)
MCHC: 32.4 g/dL (ref 31.5–35.7)
MCV: 85 fL (ref 79–97)
Platelets: 309 10*3/uL (ref 150–450)
RBC: 4.56 x10E6/uL (ref 3.77–5.28)
RDW: 13.7 % (ref 11.7–15.4)
WBC: 8 10*3/uL (ref 3.4–10.8)

## 2018-11-05 LAB — FERRITIN: Ferritin: 64 ng/mL (ref 15–150)

## 2018-11-05 LAB — IRON AND TIBC
Iron Saturation: 21 % (ref 15–55)
Iron: 61 ug/dL (ref 27–139)
Total Iron Binding Capacity: 292 ug/dL (ref 250–450)
UIBC: 231 ug/dL (ref 118–369)

## 2018-11-05 LAB — VITAMIN B12: Vitamin B-12: 746 pg/mL (ref 232–1245)

## 2018-11-08 DIAGNOSIS — M6281 Muscle weakness (generalized): Secondary | ICD-10-CM | POA: Diagnosis not present

## 2018-11-08 DIAGNOSIS — R2689 Other abnormalities of gait and mobility: Secondary | ICD-10-CM | POA: Diagnosis not present

## 2018-11-12 ENCOUNTER — Other Ambulatory Visit: Payer: Self-pay | Admitting: Cardiovascular Disease

## 2018-11-12 NOTE — Telephone Encounter (Signed)
[facility] calling  States they received a request for metoprolol 50 MG but the daughter states she thought it was to be 100 MG - please call to clarify  [phone]

## 2018-11-12 NOTE — Telephone Encounter (Signed)
Spoke with [facility] @ [facility]. Donato Heinz that we have Metoprolol Succinate 100mg  daily listed on the pt most recent April 2020 telemedicine visit. Vivien Rota verbalized understanding and voiced appreciation foe the call. Metoprolol 100mg  qd refilled #90 R-2.

## 2018-11-15 DIAGNOSIS — R2689 Other abnormalities of gait and mobility: Secondary | ICD-10-CM | POA: Diagnosis not present

## 2018-11-15 DIAGNOSIS — M6281 Muscle weakness (generalized): Secondary | ICD-10-CM | POA: Diagnosis not present

## 2018-11-22 DIAGNOSIS — R2689 Other abnormalities of gait and mobility: Secondary | ICD-10-CM | POA: Diagnosis not present

## 2018-11-22 DIAGNOSIS — M6281 Muscle weakness (generalized): Secondary | ICD-10-CM | POA: Diagnosis not present

## 2018-11-28 DIAGNOSIS — M6281 Muscle weakness (generalized): Secondary | ICD-10-CM | POA: Diagnosis not present

## 2018-11-28 DIAGNOSIS — R3981 Functional urinary incontinence: Secondary | ICD-10-CM | POA: Diagnosis not present

## 2018-11-28 DIAGNOSIS — R2689 Other abnormalities of gait and mobility: Secondary | ICD-10-CM | POA: Diagnosis not present

## 2018-11-29 DIAGNOSIS — R2689 Other abnormalities of gait and mobility: Secondary | ICD-10-CM | POA: Diagnosis not present

## 2018-11-29 DIAGNOSIS — M6281 Muscle weakness (generalized): Secondary | ICD-10-CM | POA: Diagnosis not present

## 2018-12-03 DIAGNOSIS — R2689 Other abnormalities of gait and mobility: Secondary | ICD-10-CM | POA: Diagnosis not present

## 2018-12-03 DIAGNOSIS — R3981 Functional urinary incontinence: Secondary | ICD-10-CM | POA: Diagnosis not present

## 2018-12-03 DIAGNOSIS — M6281 Muscle weakness (generalized): Secondary | ICD-10-CM | POA: Diagnosis not present

## 2018-12-06 DIAGNOSIS — M6281 Muscle weakness (generalized): Secondary | ICD-10-CM | POA: Diagnosis not present

## 2018-12-06 DIAGNOSIS — R3981 Functional urinary incontinence: Secondary | ICD-10-CM | POA: Diagnosis not present

## 2018-12-06 DIAGNOSIS — R2689 Other abnormalities of gait and mobility: Secondary | ICD-10-CM | POA: Diagnosis not present

## 2018-12-10 DIAGNOSIS — R2689 Other abnormalities of gait and mobility: Secondary | ICD-10-CM | POA: Diagnosis not present

## 2018-12-10 DIAGNOSIS — M6281 Muscle weakness (generalized): Secondary | ICD-10-CM | POA: Diagnosis not present

## 2018-12-10 DIAGNOSIS — R3981 Functional urinary incontinence: Secondary | ICD-10-CM | POA: Diagnosis not present

## 2018-12-12 DIAGNOSIS — R3981 Functional urinary incontinence: Secondary | ICD-10-CM | POA: Diagnosis not present

## 2018-12-12 DIAGNOSIS — R2689 Other abnormalities of gait and mobility: Secondary | ICD-10-CM | POA: Diagnosis not present

## 2018-12-12 DIAGNOSIS — M6281 Muscle weakness (generalized): Secondary | ICD-10-CM | POA: Diagnosis not present

## 2018-12-13 DIAGNOSIS — M6281 Muscle weakness (generalized): Secondary | ICD-10-CM | POA: Diagnosis not present

## 2018-12-13 DIAGNOSIS — R3981 Functional urinary incontinence: Secondary | ICD-10-CM | POA: Diagnosis not present

## 2018-12-13 DIAGNOSIS — R2689 Other abnormalities of gait and mobility: Secondary | ICD-10-CM | POA: Diagnosis not present

## 2018-12-17 DIAGNOSIS — R3981 Functional urinary incontinence: Secondary | ICD-10-CM | POA: Diagnosis not present

## 2018-12-17 DIAGNOSIS — R2689 Other abnormalities of gait and mobility: Secondary | ICD-10-CM | POA: Diagnosis not present

## 2018-12-17 DIAGNOSIS — M6281 Muscle weakness (generalized): Secondary | ICD-10-CM | POA: Diagnosis not present

## 2018-12-19 DIAGNOSIS — M6281 Muscle weakness (generalized): Secondary | ICD-10-CM | POA: Diagnosis not present

## 2018-12-19 DIAGNOSIS — R2689 Other abnormalities of gait and mobility: Secondary | ICD-10-CM | POA: Diagnosis not present

## 2018-12-19 DIAGNOSIS — R3981 Functional urinary incontinence: Secondary | ICD-10-CM | POA: Diagnosis not present

## 2018-12-20 DIAGNOSIS — R3981 Functional urinary incontinence: Secondary | ICD-10-CM | POA: Diagnosis not present

## 2018-12-20 DIAGNOSIS — R2689 Other abnormalities of gait and mobility: Secondary | ICD-10-CM | POA: Diagnosis not present

## 2018-12-20 DIAGNOSIS — M6281 Muscle weakness (generalized): Secondary | ICD-10-CM | POA: Diagnosis not present

## 2018-12-24 DIAGNOSIS — M6281 Muscle weakness (generalized): Secondary | ICD-10-CM | POA: Diagnosis not present

## 2018-12-24 DIAGNOSIS — R3981 Functional urinary incontinence: Secondary | ICD-10-CM | POA: Diagnosis not present

## 2018-12-24 DIAGNOSIS — R2689 Other abnormalities of gait and mobility: Secondary | ICD-10-CM | POA: Diagnosis not present

## 2018-12-26 DIAGNOSIS — R3981 Functional urinary incontinence: Secondary | ICD-10-CM | POA: Diagnosis not present

## 2018-12-26 DIAGNOSIS — R2689 Other abnormalities of gait and mobility: Secondary | ICD-10-CM | POA: Diagnosis not present

## 2018-12-26 DIAGNOSIS — M6281 Muscle weakness (generalized): Secondary | ICD-10-CM | POA: Diagnosis not present

## 2018-12-27 DIAGNOSIS — R3981 Functional urinary incontinence: Secondary | ICD-10-CM | POA: Diagnosis not present

## 2018-12-27 DIAGNOSIS — M6281 Muscle weakness (generalized): Secondary | ICD-10-CM | POA: Diagnosis not present

## 2018-12-27 DIAGNOSIS — R2689 Other abnormalities of gait and mobility: Secondary | ICD-10-CM | POA: Diagnosis not present

## 2019-01-01 DIAGNOSIS — R2689 Other abnormalities of gait and mobility: Secondary | ICD-10-CM | POA: Diagnosis not present

## 2019-01-01 DIAGNOSIS — N3946 Mixed incontinence: Secondary | ICD-10-CM | POA: Diagnosis not present

## 2019-01-01 DIAGNOSIS — M6281 Muscle weakness (generalized): Secondary | ICD-10-CM | POA: Diagnosis not present

## 2019-01-03 DIAGNOSIS — N3946 Mixed incontinence: Secondary | ICD-10-CM | POA: Diagnosis not present

## 2019-01-03 DIAGNOSIS — R2689 Other abnormalities of gait and mobility: Secondary | ICD-10-CM | POA: Diagnosis not present

## 2019-01-03 DIAGNOSIS — M6281 Muscle weakness (generalized): Secondary | ICD-10-CM | POA: Diagnosis not present

## 2019-01-08 DIAGNOSIS — M6281 Muscle weakness (generalized): Secondary | ICD-10-CM | POA: Diagnosis not present

## 2019-01-08 DIAGNOSIS — R2689 Other abnormalities of gait and mobility: Secondary | ICD-10-CM | POA: Diagnosis not present

## 2019-01-08 DIAGNOSIS — N3946 Mixed incontinence: Secondary | ICD-10-CM | POA: Diagnosis not present

## 2019-01-14 DIAGNOSIS — N3946 Mixed incontinence: Secondary | ICD-10-CM | POA: Diagnosis not present

## 2019-01-14 DIAGNOSIS — R2689 Other abnormalities of gait and mobility: Secondary | ICD-10-CM | POA: Diagnosis not present

## 2019-01-14 DIAGNOSIS — M6281 Muscle weakness (generalized): Secondary | ICD-10-CM | POA: Diagnosis not present

## 2019-01-15 DIAGNOSIS — H401132 Primary open-angle glaucoma, bilateral, moderate stage: Secondary | ICD-10-CM | POA: Diagnosis not present

## 2019-01-15 DIAGNOSIS — N3946 Mixed incontinence: Secondary | ICD-10-CM | POA: Diagnosis not present

## 2019-01-15 DIAGNOSIS — M6281 Muscle weakness (generalized): Secondary | ICD-10-CM | POA: Diagnosis not present

## 2019-01-15 DIAGNOSIS — R2689 Other abnormalities of gait and mobility: Secondary | ICD-10-CM | POA: Diagnosis not present

## 2019-01-17 DIAGNOSIS — M6281 Muscle weakness (generalized): Secondary | ICD-10-CM | POA: Diagnosis not present

## 2019-01-17 DIAGNOSIS — N3946 Mixed incontinence: Secondary | ICD-10-CM | POA: Diagnosis not present

## 2019-01-17 DIAGNOSIS — R2689 Other abnormalities of gait and mobility: Secondary | ICD-10-CM | POA: Diagnosis not present

## 2019-01-20 ENCOUNTER — Telehealth: Payer: Self-pay

## 2019-01-20 NOTE — Telephone Encounter (Signed)
Pt left v/m that she is at [facility] and received a flu shot this morning; pt wants to know if should have a pneumonia shot that was offered at [facility]. Pt request cb.

## 2019-01-20 NOTE — Telephone Encounter (Signed)
Based off of my records she is up to date and doesn't need another pneumonia vaccination until 2023.

## 2019-01-21 DIAGNOSIS — R69 Illness, unspecified: Secondary | ICD-10-CM | POA: Diagnosis not present

## 2019-01-21 NOTE — Telephone Encounter (Signed)
Spoken and notified patient of Kate Clark's comments. Patient verbalized understanding.  

## 2019-01-22 DIAGNOSIS — R2689 Other abnormalities of gait and mobility: Secondary | ICD-10-CM | POA: Diagnosis not present

## 2019-01-22 DIAGNOSIS — N3946 Mixed incontinence: Secondary | ICD-10-CM | POA: Diagnosis not present

## 2019-01-22 DIAGNOSIS — M6281 Muscle weakness (generalized): Secondary | ICD-10-CM | POA: Diagnosis not present

## 2019-01-24 DIAGNOSIS — M6281 Muscle weakness (generalized): Secondary | ICD-10-CM | POA: Diagnosis not present

## 2019-01-24 DIAGNOSIS — R2689 Other abnormalities of gait and mobility: Secondary | ICD-10-CM | POA: Diagnosis not present

## 2019-01-24 DIAGNOSIS — N3946 Mixed incontinence: Secondary | ICD-10-CM | POA: Diagnosis not present

## 2019-01-28 DIAGNOSIS — M6281 Muscle weakness (generalized): Secondary | ICD-10-CM | POA: Diagnosis not present

## 2019-01-28 DIAGNOSIS — R2689 Other abnormalities of gait and mobility: Secondary | ICD-10-CM | POA: Diagnosis not present

## 2019-01-28 DIAGNOSIS — N3946 Mixed incontinence: Secondary | ICD-10-CM | POA: Diagnosis not present

## 2019-01-31 DIAGNOSIS — M6281 Muscle weakness (generalized): Secondary | ICD-10-CM | POA: Diagnosis not present

## 2019-01-31 DIAGNOSIS — N3946 Mixed incontinence: Secondary | ICD-10-CM | POA: Diagnosis not present

## 2019-01-31 DIAGNOSIS — R2689 Other abnormalities of gait and mobility: Secondary | ICD-10-CM | POA: Diagnosis not present

## 2019-02-04 DIAGNOSIS — R2689 Other abnormalities of gait and mobility: Secondary | ICD-10-CM | POA: Diagnosis not present

## 2019-02-04 DIAGNOSIS — N3946 Mixed incontinence: Secondary | ICD-10-CM | POA: Diagnosis not present

## 2019-02-04 DIAGNOSIS — M6281 Muscle weakness (generalized): Secondary | ICD-10-CM | POA: Diagnosis not present

## 2019-02-07 DIAGNOSIS — M6281 Muscle weakness (generalized): Secondary | ICD-10-CM | POA: Diagnosis not present

## 2019-02-07 DIAGNOSIS — N3946 Mixed incontinence: Secondary | ICD-10-CM | POA: Diagnosis not present

## 2019-02-07 DIAGNOSIS — R2689 Other abnormalities of gait and mobility: Secondary | ICD-10-CM | POA: Diagnosis not present

## 2019-02-11 DIAGNOSIS — R2689 Other abnormalities of gait and mobility: Secondary | ICD-10-CM | POA: Diagnosis not present

## 2019-02-11 DIAGNOSIS — N3946 Mixed incontinence: Secondary | ICD-10-CM | POA: Diagnosis not present

## 2019-02-11 DIAGNOSIS — M6281 Muscle weakness (generalized): Secondary | ICD-10-CM | POA: Diagnosis not present

## 2019-02-13 DIAGNOSIS — M6281 Muscle weakness (generalized): Secondary | ICD-10-CM | POA: Diagnosis not present

## 2019-02-13 DIAGNOSIS — R2689 Other abnormalities of gait and mobility: Secondary | ICD-10-CM | POA: Diagnosis not present

## 2019-02-13 DIAGNOSIS — N3946 Mixed incontinence: Secondary | ICD-10-CM | POA: Diagnosis not present

## 2019-02-14 DIAGNOSIS — H401132 Primary open-angle glaucoma, bilateral, moderate stage: Secondary | ICD-10-CM | POA: Diagnosis not present

## 2019-02-18 DIAGNOSIS — M6281 Muscle weakness (generalized): Secondary | ICD-10-CM | POA: Diagnosis not present

## 2019-02-18 DIAGNOSIS — N3946 Mixed incontinence: Secondary | ICD-10-CM | POA: Diagnosis not present

## 2019-02-18 DIAGNOSIS — R2689 Other abnormalities of gait and mobility: Secondary | ICD-10-CM | POA: Diagnosis not present

## 2019-02-21 DIAGNOSIS — N3946 Mixed incontinence: Secondary | ICD-10-CM | POA: Diagnosis not present

## 2019-02-21 DIAGNOSIS — M6281 Muscle weakness (generalized): Secondary | ICD-10-CM | POA: Diagnosis not present

## 2019-02-21 DIAGNOSIS — R2689 Other abnormalities of gait and mobility: Secondary | ICD-10-CM | POA: Diagnosis not present

## 2019-02-24 DIAGNOSIS — N3946 Mixed incontinence: Secondary | ICD-10-CM | POA: Diagnosis not present

## 2019-02-24 DIAGNOSIS — R2689 Other abnormalities of gait and mobility: Secondary | ICD-10-CM | POA: Diagnosis not present

## 2019-02-24 DIAGNOSIS — M6281 Muscle weakness (generalized): Secondary | ICD-10-CM | POA: Diagnosis not present

## 2019-02-28 DIAGNOSIS — R2689 Other abnormalities of gait and mobility: Secondary | ICD-10-CM | POA: Diagnosis not present

## 2019-02-28 DIAGNOSIS — N3946 Mixed incontinence: Secondary | ICD-10-CM | POA: Diagnosis not present

## 2019-02-28 DIAGNOSIS — M6281 Muscle weakness (generalized): Secondary | ICD-10-CM | POA: Diagnosis not present

## 2019-03-04 DIAGNOSIS — N3946 Mixed incontinence: Secondary | ICD-10-CM | POA: Diagnosis not present

## 2019-03-04 DIAGNOSIS — R2689 Other abnormalities of gait and mobility: Secondary | ICD-10-CM | POA: Diagnosis not present

## 2019-03-04 DIAGNOSIS — M6281 Muscle weakness (generalized): Secondary | ICD-10-CM | POA: Diagnosis not present

## 2019-03-17 DIAGNOSIS — H401132 Primary open-angle glaucoma, bilateral, moderate stage: Secondary | ICD-10-CM | POA: Diagnosis not present

## 2019-03-18 DIAGNOSIS — N3946 Mixed incontinence: Secondary | ICD-10-CM | POA: Diagnosis not present

## 2019-03-18 DIAGNOSIS — M6281 Muscle weakness (generalized): Secondary | ICD-10-CM | POA: Diagnosis not present

## 2019-03-18 DIAGNOSIS — R2689 Other abnormalities of gait and mobility: Secondary | ICD-10-CM | POA: Diagnosis not present

## 2019-03-26 DIAGNOSIS — M6281 Muscle weakness (generalized): Secondary | ICD-10-CM | POA: Diagnosis not present

## 2019-03-26 DIAGNOSIS — N3946 Mixed incontinence: Secondary | ICD-10-CM | POA: Diagnosis not present

## 2019-03-26 DIAGNOSIS — R2689 Other abnormalities of gait and mobility: Secondary | ICD-10-CM | POA: Diagnosis not present

## 2019-04-08 ENCOUNTER — Other Ambulatory Visit: Payer: Self-pay | Admitting: Infectious Diseases

## 2019-04-08 ENCOUNTER — Other Ambulatory Visit: Payer: Self-pay | Admitting: Primary Care

## 2019-04-08 DIAGNOSIS — L03119 Cellulitis of unspecified part of limb: Secondary | ICD-10-CM

## 2019-04-08 NOTE — Telephone Encounter (Signed)
Last prescribed on 09/03/2018 by Dr Delaine Lame. Last appointment on 09/03/2018.

## 2019-04-15 DIAGNOSIS — C4401 Basal cell carcinoma of skin of lip: Secondary | ICD-10-CM | POA: Diagnosis not present

## 2019-04-15 DIAGNOSIS — C44319 Basal cell carcinoma of skin of other parts of face: Secondary | ICD-10-CM | POA: Diagnosis not present

## 2019-04-15 DIAGNOSIS — L570 Actinic keratosis: Secondary | ICD-10-CM | POA: Diagnosis not present

## 2019-04-15 DIAGNOSIS — C4491 Basal cell carcinoma of skin, unspecified: Secondary | ICD-10-CM

## 2019-04-15 HISTORY — DX: Basal cell carcinoma of skin, unspecified: C44.91

## 2019-05-12 ENCOUNTER — Other Ambulatory Visit: Payer: Self-pay | Admitting: Cardiovascular Disease

## 2019-05-16 ENCOUNTER — Other Ambulatory Visit: Payer: Medicare HMO

## 2019-05-27 DIAGNOSIS — R32 Unspecified urinary incontinence: Secondary | ICD-10-CM | POA: Diagnosis not present

## 2019-05-27 DIAGNOSIS — I4891 Unspecified atrial fibrillation: Secondary | ICD-10-CM | POA: Diagnosis not present

## 2019-05-27 DIAGNOSIS — D6869 Other thrombophilia: Secondary | ICD-10-CM | POA: Diagnosis not present

## 2019-05-27 DIAGNOSIS — H409 Unspecified glaucoma: Secondary | ICD-10-CM | POA: Diagnosis not present

## 2019-05-27 DIAGNOSIS — R062 Wheezing: Secondary | ICD-10-CM | POA: Diagnosis not present

## 2019-05-27 DIAGNOSIS — E785 Hyperlipidemia, unspecified: Secondary | ICD-10-CM | POA: Diagnosis not present

## 2019-05-27 DIAGNOSIS — R69 Illness, unspecified: Secondary | ICD-10-CM | POA: Diagnosis not present

## 2019-05-27 DIAGNOSIS — I509 Heart failure, unspecified: Secondary | ICD-10-CM | POA: Diagnosis not present

## 2019-05-27 DIAGNOSIS — Z008 Encounter for other general examination: Secondary | ICD-10-CM | POA: Diagnosis not present

## 2019-05-27 DIAGNOSIS — I110 Hypertensive heart disease with heart failure: Secondary | ICD-10-CM | POA: Diagnosis not present

## 2019-05-27 DIAGNOSIS — D649 Anemia, unspecified: Secondary | ICD-10-CM | POA: Diagnosis not present

## 2019-06-08 ENCOUNTER — Ambulatory Visit: Payer: Medicare HMO

## 2019-06-09 ENCOUNTER — Ambulatory Visit: Payer: Medicare HMO | Admitting: Family

## 2019-06-09 DIAGNOSIS — H353213 Exudative age-related macular degeneration, right eye, with inactive scar: Secondary | ICD-10-CM | POA: Diagnosis not present

## 2019-06-11 NOTE — Progress Notes (Signed)
Office Visit    Patient Name: Heather Larson Date of Encounter: 06/12/2019  Primary Care Provider:  Pleas Koch, NP Primary Cardiologist:  Ida Rogue, MD  Chief Complaint    86 year old female with history of permanent atrial fibrillation on Xarelto, chronic diastolic CHF, hypertension, hyperlipidemia, chronic hyponatremia, iron deficiency anemia, lower extremity edema felt to be in the setting of dependent edema and exacerbated by diltiazem use, recurrent Cdiff, and breast cancer s/p mastectomy, and here for 30-month follow-up.  Past Medical History    Past Medical History:  Diagnosis Date  . (HFpEF) heart failure with preserved ejection fraction ([place])   . Anemia   . Atrial fibrillation, permanent ([place])    on Xarelto  . Bilateral lower extremity edema   . Bilateral lower leg cellulitis 11/11/2017  . Cancer [facility])    breast cancer at age 21 and skin  . Cellulitis    lower abdomen  . Diarrhea 01/11/2018  . Hyperlipidemia   . Hypertension   . Incontinence   . Low sodium levels   . Macular degeneration   . Malnutrition of moderate degree 11/23/2017   Past Surgical History:  Procedure Laterality Date  . BREAST SURGERY     right mastectomy  . COLONOSCOPY WITH PROPOFOL N/A 05/31/2018   Procedure: COLONOSCOPY WITH PROPOFOL;  Surgeon: Lin Landsman, MD;  Location: [facility] ENDOSCOPY;  Service: Gastroenterology;  Laterality: N/A;  . EYE SURGERY     x3  . 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 FECAL TRANSPLANT N/A 05/31/2018   Procedure: FECAL TRANSPLANT;  Surgeon: Lin Landsman, MD;  Location: [facility] ENDOSCOPY;  Service: Gastroenterology;  Laterality: N/A;  . IR RADIOLOGIST EVAL & MGMT  09/17/2017  . MASTECTOMY     right  . SHOULDER OPEN ROTATOR CUFF REPAIR     left  . SKIN BIOPSY     skin cancer    Allergies  No Known  Allergies  History of Present Illness    86 year old female with PMH as above.  She was initially diagnosed with atrial fibrillation in 2014 and has been anticoagulated with Xarelto.  Most recent echo in 2019 showed EF 60 to 65%, NRWMA, mild MR, and mild LAE. She has been off of diltiazem secondary to lower extremity edema.  She was hospitalized 03/2018 with mild volume overload and worsening iron deficiency anemia, requiring blood transfusion.  She was successfully diuresed.  Post hospital course was complicated by C. 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  She is also been hospitalized for recurrent lower extremity cellulitis, complicated by bacteremia with recommendation for suppressive antibiotics per ID and given recurrence of cellulitis.  She was last seen in the office 03/2018 by Christell Faith, PA-C.  At that time, she was noted to be doing well from a cardiac perspective.  It was noted that her HFpEF was likely exacerbated by worsening edema in A. fib with RVR.  She was continued on Xarelto 15 mg daily, given reduced creatinine clearance.  She had an upcoming fecal transplant with Xarelto held 2 days prior to her procedure.  Her lower extremity edema had improved after discontinuation of diltiazem.  Lymphedema pumps had arrived, yet she had not yet put them on.  Continued lymphedema pumps and leg elevation was recommended.  It was  thought that her lower extremity edema was driven by chronic venous insufficiency and exacerbated by previous diltiazem use.  Her weight was reportedly stable between 125 and 130 pounds at home.  She requested to decrease her Lasix; therefore, Lasix was decreased to 20 mg every other day.  She was seen again via virtual visit 08/14/2018.  Lasix was continued at 10 mg twice daily. Per review of phone documentation, her Lasix was further decreased 10/16/2018 due to patient report of  frequent urination on Lasix.  Since that time, she has reportedly been taking 10 mg of Lasix every other day.  Today, she presents to clinic with report of bilateral non-pitting lower extremity edema and erythema.  She denies any fever or weakness, such as when she had her hospitalization with C. difficile, requiring fecal transplant.   She has been followed closely by both GI and infectious disease with recommendation for chronic suppressive antibiotics and has continued that treatment with success.  No claudication/PVD s/sx reported. She denies any weeping or ulceration of her bilateral lower extremities. She has not been able to use her lymphedema pumps as recommended, as she is not able to put them on herself and lives alone in a retirement home.  She reports elevating her lower extremities while in her recliner; however, her daughter reports that she does not elevate them high enough or for long enough.  She is also unable to put on compression stockings on her own.  She continues to express frustration regarding the frequent urination while on Lasix 10 mg every other day.  Of note, she reports a headache that occurs every other day; however, she is unsure if it occurs on days she does or does not take her Lasix.  She denies any chest pain, racing heart rate, or palpitations.  No presyncope or syncope.  Her daughter does report that she has had some shortness of breath; however, the patient is unable to provide further detail regarding the shortness of breath.  She denies any abdominal distention or orthopnea.  Her weight continues to remain stable between 125 and 130 pounds at home.  She is monitoring her fluid intake but is unable to estimate how much fluid she drinks daily.  She is watching what she eats.  Per her daughter, she is also taking salt tablets secondary to hyponatremia.  She remains on Xarelto and has not missed any doses. No signs or symptoms of bleeding.  No recent falls.  She reports that  she is very careful with her walker and has undergone PT and OT to increase her stability.  Home Medications    Prior to Admission medications   Medication Sig Start Date End Date Taking? Authorizing Provider  acetaminophen (TYLENOL) 325 MG tablet Take 2 tablets (650 mg total) by mouth every 6 (six) hours as needed for mild pain (or Fever >/= 101). 05/12/18   Gouru, Illene Silver, MD  acidophilus (RISAQUAD) CAPS capsule Take 1 capsule by mouth daily. 07/01/18   Salary, Avel Peace, MD  albuterol (PROVENTIL HFA;VENTOLIN HFA) 108 (90 Base) MCG/ACT inhaler Inhale 2 puffs into the lungs every 6 (six) hours as needed for wheezing or shortness of breath.    [provider]  atorvastatin (LIPITOR) 10 MG tablet Take 1 tablet (10 mg total) by mouth daily. 08/28/18   Minna Merritts, MD  brimonidine (ALPHAGAN) 0.2 % ophthalmic solution Place 1 drop into both eyes 3 (three) times daily. Taking BID per patient 01/02/18   [provider]  calcium  carbonate (OSCAL) 1500 (600 Ca) MG TABS tablet Take by mouth 2 (two) times daily with a meal.    [provider]  diphenhydrAMINE HCl, Sleep, 25 MG CAPS Take 25-50 mg by mouth at bedtime as needed (sleep).    [provider]  dorzolamide (TRUSOPT) 2 % ophthalmic solution Place 1 drop into both eyes 3 (three) times daily. 01/02/18   [provider]  ferrous sulfate 325 (65 FE) MG EC tablet Take 325 mg by mouth daily with breakfast.    [provider]  fidaxomicin (DIFICID) 200 MG TABS tablet Take 1 pill two times a day on mon, wed, fri 11/04/18   Lin Landsman, MD  furosemide (LASIX) 20 MG tablet TAKE 1 TABLET(20 MG) BY MOUTH EVERY OTHER DAY 08/06/18   Rise Mu, PA-C  imiquimod [place]) 5 % cream  09/30/18   [provider]  metoprolol succinate (TOPROL-XL) 100 MG 24 hr tablet Take 1 tablet (100 mg total) by mouth daily. Take with or immediately following a meal. 03/26/18 08/14/27  Dunn, Areta Haber, PA-C  metoprolol  succinate (TOPROL-XL) 100 MG 24 hr tablet Take 1 tablet (100 mg total) by mouth daily. Please call to schedule appointment for further refills. Thank you! 05/12/19   Minna Merritts, MD  Multiple Vitamins-Minerals (MULTIVITAMIN ADULT) TABS Take 1 tablet by mouth daily.    [provider]  Nutritional Supplements (NUTRITIONAL SHAKE PLUS PROTEIN PO) Take by mouth.    [provider]  penicillin v potassium (VEETID) 500 MG tablet TAKE ONE TABLET BY MOUTH TWICE DAILY 04/08/19   Tsosie Billing, MD  predniSONE (DELTASONE) 10 MG tablet Take three tablets for 2 days, then two tablets for 2 days, then one tablet for 2 days. Patient not taking: Reported on 11/04/2018 10/25/18   Pleas Koch, NP  Rivaroxaban (XARELTO) 15 MG TABS tablet Take 1 tablet (15 mg total) by mouth daily. 09/30/18   Dunn, Areta Haber, PA-C  sodium chloride 1 g tablet Take 1 tablet (1 g total) by mouth 2 (two) times daily with a meal. 07/01/18   Salary, Avel Peace, MD  TRAVATAN Z 0.004 % SOLN ophthalmic solution Place 1 drop into both eyes at bedtime.  01/03/18   [provider]  vitamin C (ASCORBIC ACID) 500 MG tablet Take 500 mg by mouth daily.    [provider]    Review of Systems    She denies chest pain, palpitations, pnd, orthopnea, n, v, dizziness, syncope, weight gain, or early satiety.  She reports lower extremity edema.  Per her daughter, she has also had shortness of breath/DOE; however, the patient is unable to recall these episodes.  She reports a HA every other day and is uncertain if it occurs on days she does or does not take her Lasix.  All other systems reviewed and are otherwise negative except as noted above.  Physical Exam    VS:  BP 130/70 (BP Location: Right Arm, Patient Position: Sitting, Cuff Size: Normal)   Pulse 68   Ht 5\' 2"  (1.575 m)   Wt 125 lb (56.7 kg)   SpO2 97%   BMI 22.86 kg/m  , BMI Body mass index is 22.86 kg/m. GEN: Elderly female, in no acute  distress. HEENT: normal. Neck: Supple, no JVD, carotid bruits, or masses. Cardiac: IRIR with controlled ventricular rate, 1/6 systolic murmur.  No rubs, or gallops. No clubbing, cyanosis, bilateral lower extremity nonpitting edema and erythema.  Radials/DP/PT 2+ and equal bilaterally.  Respiratory:  Respirations regular and unlabored, clear to auscultation bilaterally. GI: Soft, nontender, nondistended, BS + x 4. MS: no deformity or atrophy. Skin: warm and dry, no rash. Neuro:  Strength and sensation are intact. Psych: Normal affect.  Accessory Clinical Findings    ECG personally reviewed by me today -atrial fibrillation, ventricular rate 68 bpm, left axis deviation, poor conduction in leads III/aVF and unchanged from previous- no acute changes.  Filed Weights   06/12/19 1048  Weight: 125 lb (56.7 kg)     Echo 03/05/2018 Left ventricle: The cavity size was normal. Systolic function was  normal. The estimated ejection fraction was in the range of 60%  to 65%. Wall motion was normal; there were no regional wall  motion abnormalities.  - Aortic valve: Transvalvular velocity was not recorded.  - Mitral valve: There was mild regurgitation.  - Left atrium: The atrium was mildly dilated.  - Right ventricle: Poorly visualized. Systolic function was grossly  normal.  - Pulmonary arteries: Systolic pressure was within the normal  range.   Labs: 07/08/2018: Sodium 132, potassium 5.4, glucose 90, creatinine 0.71, BUN 15, iron 61, ferritin 64, B12 746, WBC 8.0, hemoglobin 12.6, RBC 4.56, hematocrit 38.9, platelets 309 06/28/2018: TSH 3.394, cortisol 16.0  Assessment & Plan    Permanent Atrial fibrillation --Remains asymptomatic in atrial fibrillation with ventricular rate well controlled. Continue Toprol XL 100 mg daily for rate control. CHA2DS2VASc score of at least 5 (CHF, HTN, agex2, female) with recommendation for long term [place]. Continue Xarelto 15mg  daily as no s/sx of bleeding  with most recent CBC showing Hgb stable. Will recheck a BMET today to ensure she still meets criteria for current reduced dosing Xarelto (CrCl <50). Reviewed that she showed seek immediate care if she were to fall and hit her head, as she will require an immediate scan.   Chronic diastolic CHF Hyponatremia --Exam notable for lower extremity edema and erythema today, which is likely worsened from her previous office visit after further reducing her Lasix from 40 mg daily  20mg  daily   10 mg every other day. She reports significant lifestyle limitations 2/2 frequency of urination and will delay taking her lasix on days she has to be somewhere, so that she know she can be near a bathroom. She did not take her lasix prior to today's appointment and plans to take it as soon as she gets back.  Long discussion regarding the importance of maintaining her dry weight and the ability to decompensate quickly once volume overloaded / flash pulmonary edema. She still feels the frequency of urination is not worth the benefits of the lasix at this time and prefers to remain at Lasix 10 mg every other day. Recheck a BMET to check renal function and sodium given h/o hyponatremia and use of salt tablets. Her daughter reports that she has been on a salt supplement BID and reduced to qd dosing recently.  She will also call the office if she develops any worsening heart failure symptoms, which were reviewed in great detail. We discussed the importance of weighing herself daily and monitoring her blood pressure at home. She reports her home weight continues to run 125-130lbs at home. Agreeable to sliding scale lasix if weight increases more than 3-5 pounds.  Lower extremity edema and erythema -Bilateral edema and erythema on exam.  She has not been unable to use her lymphedema pumps as she cannot put them or her compression stockings on by herself.  Suspect bilateral erythema and  edema is multifactorial with her anemia, dependent  edema, lymphedema, and reduced Lasix dose. No weeping or ulceration noted on exam. She remains on prophylactic antibiotics due to her history of cellulitis and denies fever or weakness as in the past with her cellulitis. No signs of infection on exam. No pain / claudication sx and intact pulses on exam. Discussed wrapping her legs with ACE bandages (changed frequently) as an alternative. Also discussed proper technique for leg elevation. Agreeable to call the office if she notes worsening erythema edema/weeping/ulceration/infection. She is agreeable to sliding scale lasix as above.  Hypertension -BP reasonably controlled with consideration of her age and improved from previous visits at 130/70 (previously 140/60). Continue current medications.Continue to monitor BP. She does report occasional HA every other day and will pay attention to her BP on days she does not take her lasix if elevated BP may be the culprit (vs dehydration on days she does take her lasix).  HLD  --Continue atorvastatin 10 mg daily. 07/2017 LDL 56 and at goal low 70.  Iron deficiency anemia --She has required transfusion in the past. Her anemia has been monitored by her PCP. Most recent hemoglobin 12.6 with hematocrit 38.9.   ------  Disposition: BMET for renal function, electrolytes. Monitor sodium/fluid. Call the office if worsening edema, volume status, or signs of worsening heart failure sx (discussed in detail). Follow-up in 3 months to reassess volume given reduced lasix.  Further recommendations, if needed, and following BMET.  **Total time spent with patient today 45 minutes. This includes reviewing records, evaluating the patient, and coordinating care.  We reviewed worsening heart failure symptoms in detail, as well as CHF education and lifestyle changes. We discussed daily weights and blood pressure control. We reviewed sliding scale lasix.  Long discussion today regarding the risks of reducing her diuretic given her  risk of volume overload. We also discussed anticoagulation and indications for immediate medical attention if fall / hits head.   Face-to-face time >50%.    Arvil Chaco, PA-C 06/12/2019, 11:45 AM

## 2019-06-12 ENCOUNTER — Other Ambulatory Visit: Payer: Self-pay

## 2019-06-12 ENCOUNTER — Encounter: Payer: Self-pay | Admitting: Physician Assistant

## 2019-06-12 ENCOUNTER — Ambulatory Visit (INDEPENDENT_AMBULATORY_CARE_PROVIDER_SITE_OTHER): Payer: Medicare HMO | Admitting: Physician Assistant

## 2019-06-12 VITALS — BP 130/70 | HR 68 | Ht 62.0 in | Wt 125.0 lb

## 2019-06-12 DIAGNOSIS — D509 Iron deficiency anemia, unspecified: Secondary | ICD-10-CM | POA: Diagnosis not present

## 2019-06-12 DIAGNOSIS — I5032 Chronic diastolic (congestive) heart failure: Secondary | ICD-10-CM | POA: Diagnosis not present

## 2019-06-12 DIAGNOSIS — R600 Localized edema: Secondary | ICD-10-CM

## 2019-06-12 DIAGNOSIS — E782 Mixed hyperlipidemia: Secondary | ICD-10-CM

## 2019-06-12 DIAGNOSIS — E871 Hypo-osmolality and hyponatremia: Secondary | ICD-10-CM

## 2019-06-12 DIAGNOSIS — I10 Essential (primary) hypertension: Secondary | ICD-10-CM | POA: Diagnosis not present

## 2019-06-12 DIAGNOSIS — I4821 Permanent atrial fibrillation: Secondary | ICD-10-CM

## 2019-06-12 DIAGNOSIS — I890 Lymphedema, not elsewhere classified: Secondary | ICD-10-CM

## 2019-06-12 NOTE — Patient Instructions (Signed)
Medication Instructions:  No changes  *If you need a refill on your cardiac medications before your next appointment, please call your pharmacy*  Lab Work: BMET done today  If you have labs (blood work) drawn today and your tests are completely normal, you will receive your results only by: Marland Kitchen MyChart Message (if you have MyChart) OR . A paper copy in the mail If you have any lab test that is abnormal or we need to change your treatment, we will call you to review the results.  Testing/Procedures: None  Follow-Up: At [facility], you and your health needs are our priority.  As part of our continuing mission to provide you with exceptional heart care, we have created designated Provider Care Teams.  These Care Teams include your primary Cardiologist (physician) and Advanced Practice Providers (APPs -  Physician Assistants and Nurse Practitioners) who all work together to provide you with the care you need, when you need it.  Your next appointment:   3 month(s)  The format for your next appointment:   In Person  Provider:    You may see Ida Rogue, MD or one of the following Advanced Practice Providers on your designated Care Team:    Murray Hodgkins, NP  Christell Faith, PA-C  Marrianne Mood, PA-C

## 2019-06-13 LAB — BASIC METABOLIC PANEL
BUN/Creatinine Ratio: 25 (ref 12–28)
BUN: 21 mg/dL (ref 10–36)
CO2: 18 mmol/L — ABNORMAL LOW (ref 20–29)
Calcium: 9.5 mg/dL (ref 8.7–10.3)
Chloride: 100 mmol/L (ref 96–106)
Creatinine, Ser: 0.83 mg/dL (ref 0.57–1.00)
GFR calc Af Amer: 70 mL/min/{1.73_m2} (ref >59)
GFR calc non Af Amer: 61 mL/min/{1.73_m2} (ref >59)
Glucose: 92 mg/dL (ref 65–99)
Potassium: 5.2 mmol/L (ref 3.5–5.2)
Sodium: 134 mmol/L (ref 134–144)

## 2019-06-16 DIAGNOSIS — L821 Other seborrheic keratosis: Secondary | ICD-10-CM | POA: Diagnosis not present

## 2019-06-16 DIAGNOSIS — C4401 Basal cell carcinoma of skin of lip: Secondary | ICD-10-CM | POA: Diagnosis not present

## 2019-06-23 ENCOUNTER — Telehealth: Payer: Self-pay | Admitting: Primary Care

## 2019-06-23 NOTE — Progress Notes (Signed)
Patient is not eligible for CCM. She lives at a retirement center.   Heather Larson [employer] Scheduler

## 2019-06-24 ENCOUNTER — Ambulatory Visit: Payer: Medicare HMO

## 2019-07-09 DIAGNOSIS — H401132 Primary open-angle glaucoma, bilateral, moderate stage: Secondary | ICD-10-CM | POA: Diagnosis not present

## 2019-07-18 ENCOUNTER — Encounter: Payer: Self-pay | Admitting: *Deleted

## 2019-07-24 ENCOUNTER — Telehealth: Payer: Self-pay | Admitting: Dermatology

## 2019-07-24 NOTE — Telephone Encounter (Signed)
Tried calling to let patient know that okay with Dr. Denna Haggard if  Dr. Winifred Olive does procedure.

## 2019-07-24 NOTE — Telephone Encounter (Signed)
Had MOHS surgery. Dr. Winifred Olive found spot on bridge of nose that can be burned off that you worked on before. She's going back to him for followup, and she says he wants to know if it's OK for him to burn it off.

## 2019-07-24 NOTE — Telephone Encounter (Signed)
Dr. Darene Lamer is OK with this.

## 2019-07-31 ENCOUNTER — Other Ambulatory Visit: Payer: Self-pay | Admitting: Gastroenterology

## 2019-07-31 DIAGNOSIS — A0471 Enterocolitis due to Clostridium difficile, recurrent: Secondary | ICD-10-CM

## 2019-08-07 NOTE — Telephone Encounter (Signed)
Phone call to patient to tell her that Dr. Denna Haggard is okay wit Dr. Winifred Olive treating that place on her nose.

## 2019-08-13 IMAGING — DX DG ABDOMEN 2V
3 series · 3 of 3 positions shown · non-contrast
Comparison: 11/24/2017

CLINICAL DATA: Abdominal bloating with mild fecal incontinence.

EXAM:
ABDOMEN - 2 VIEW

[abdomen erect]
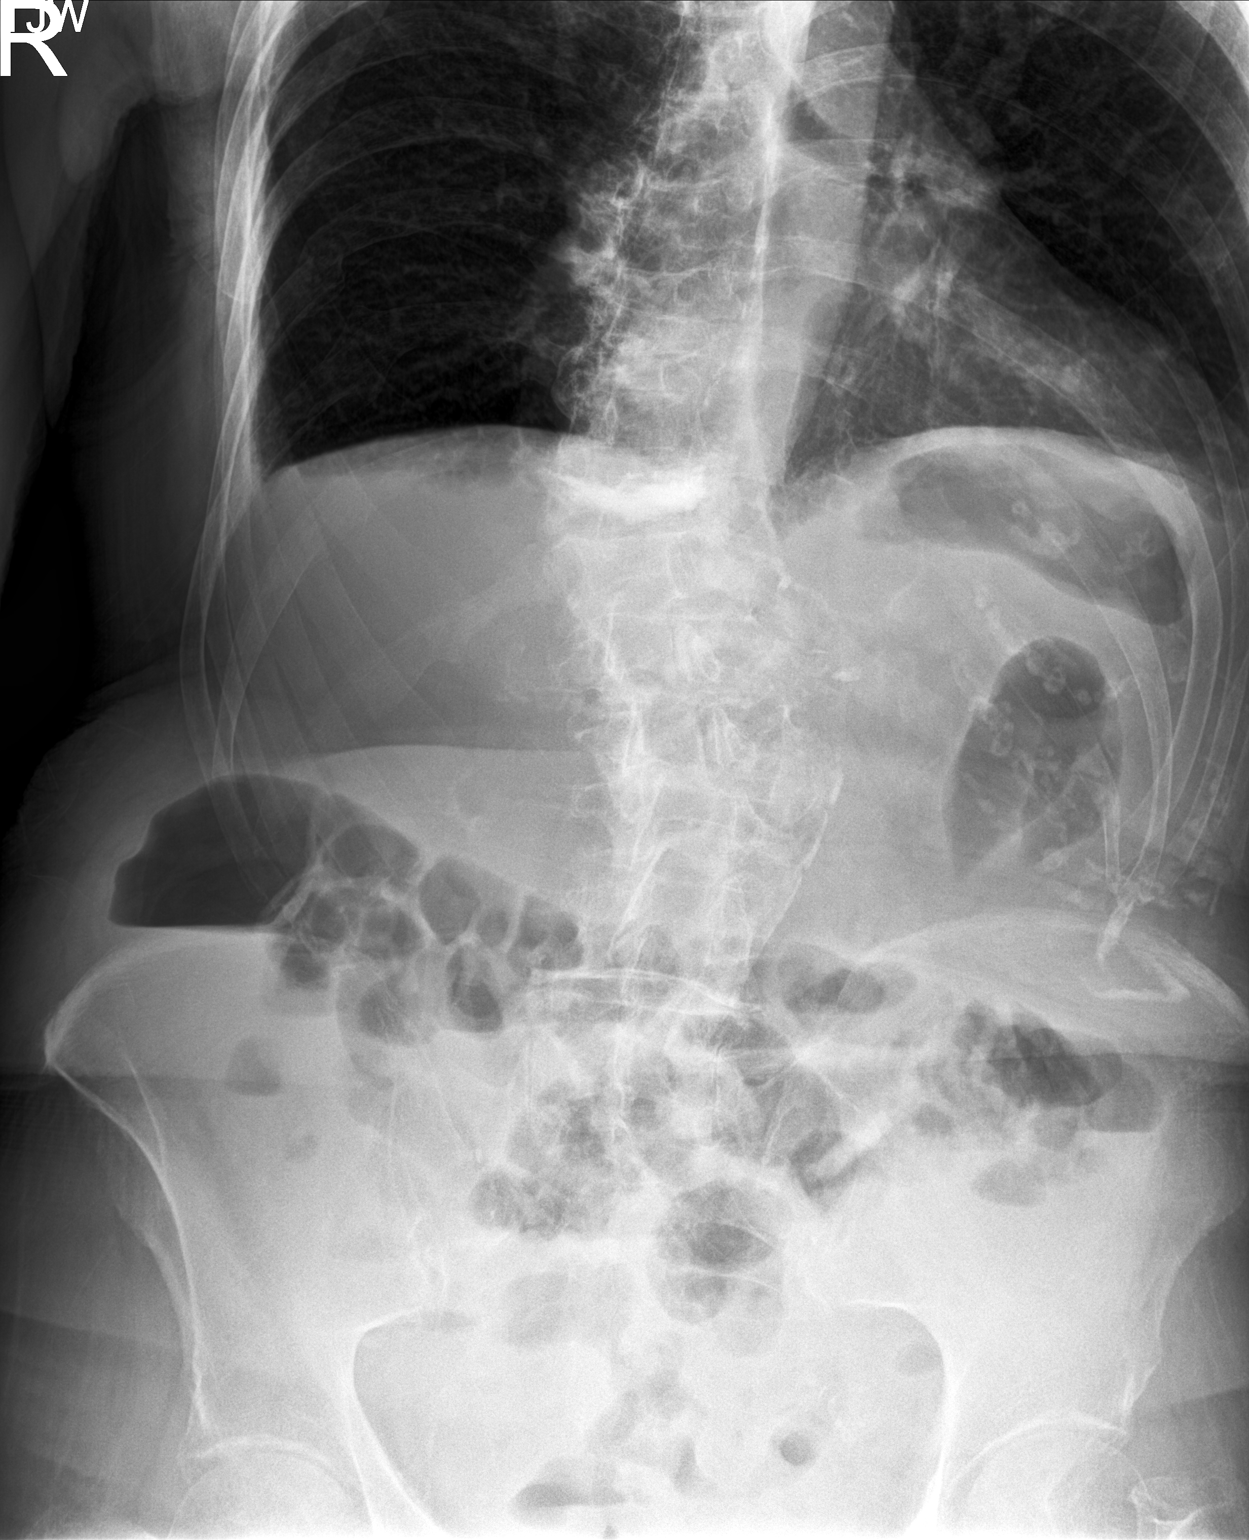

[abdomen supine (1 of 2)]
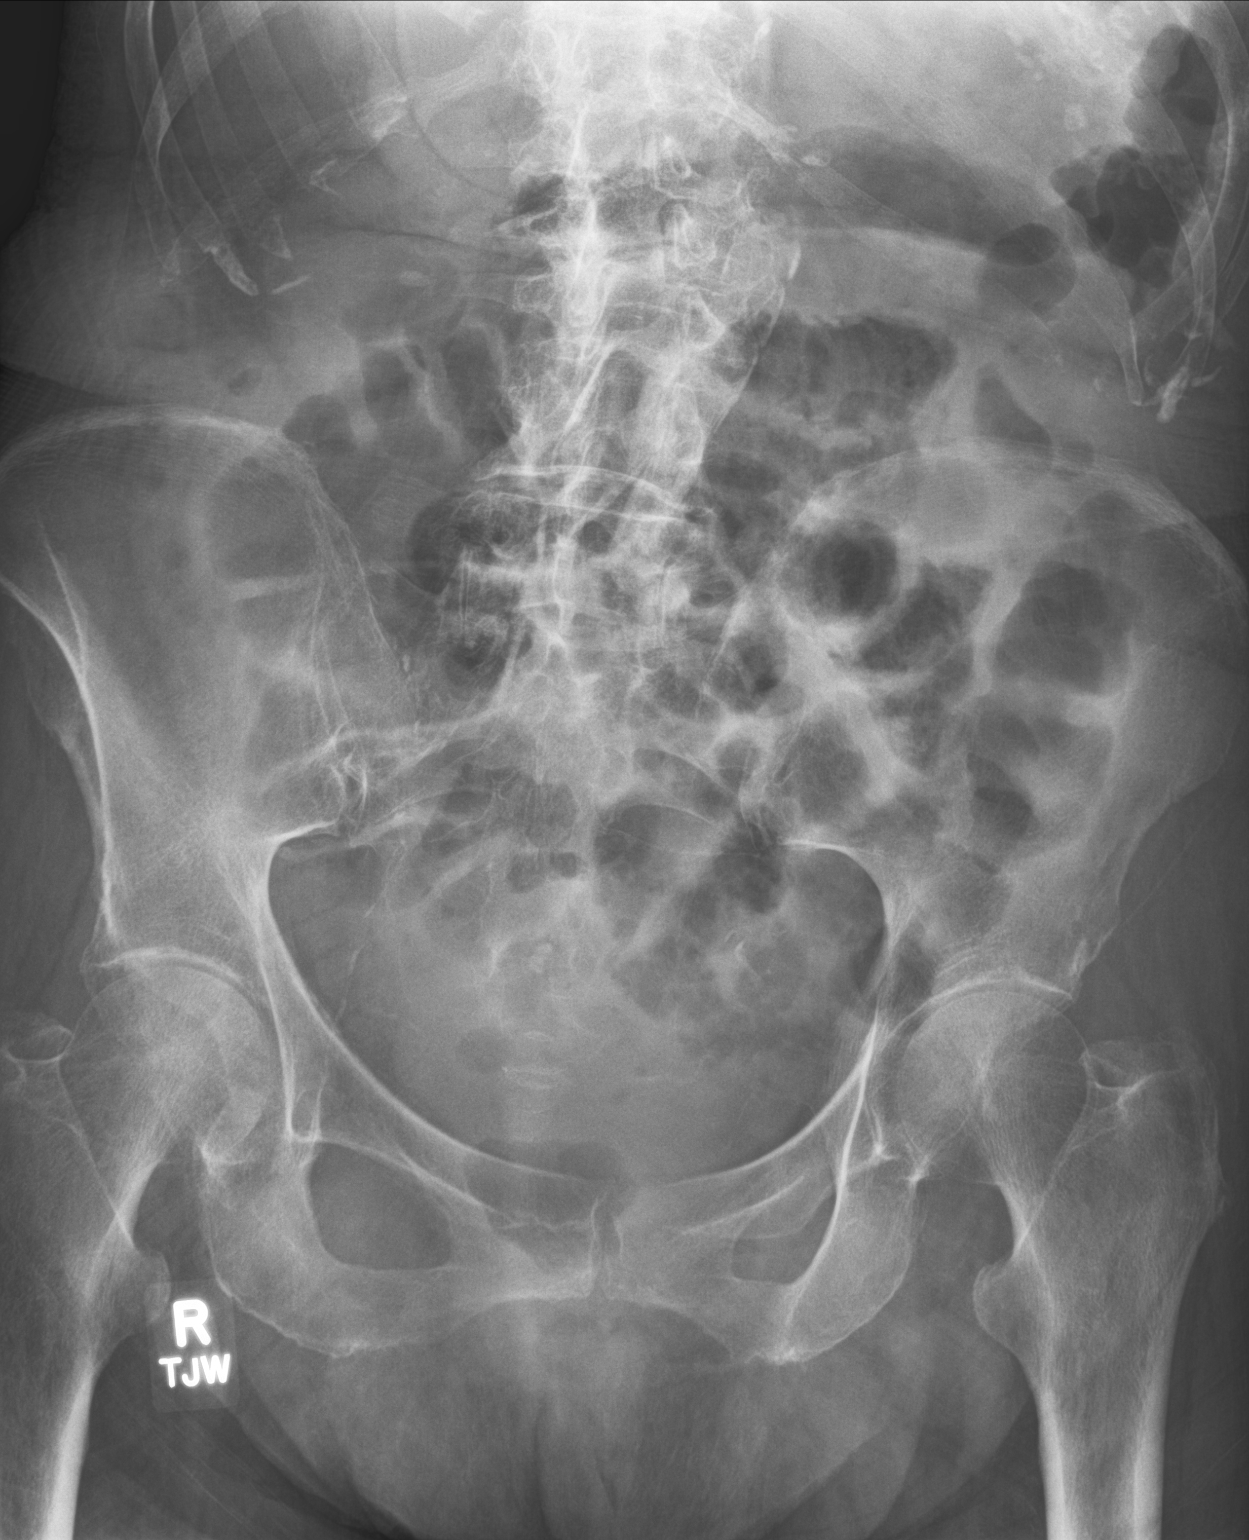

[abdomen supine (2 of 2)]
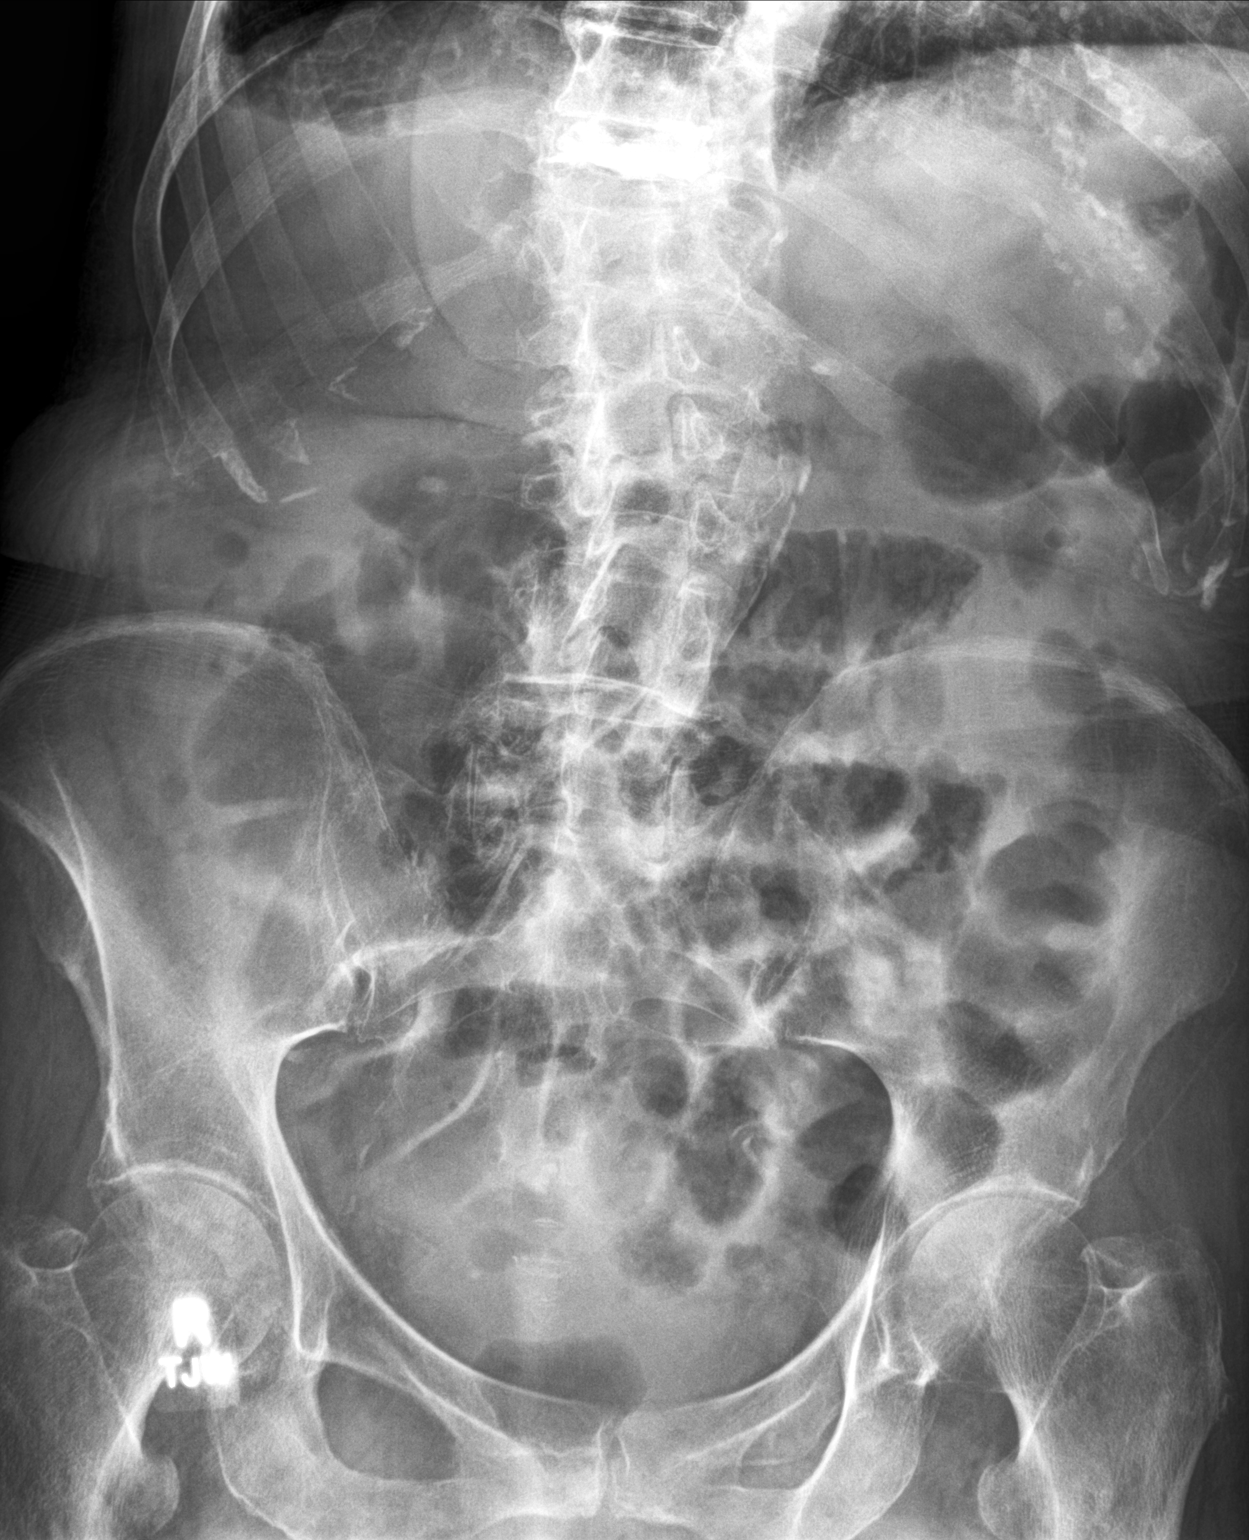

[3 of 3 positions shown; findings below may reference images not displayed]

FINDINGS: Air is present throughout the colon. There are a few air-filled
minimally dilated small bowel loops over the left mid abdomen. No
free peritoneal air. A few nonspecific air-fluid levels mostly over
the colon. Remainder of the exam is unchanged.
IMPRESSION: Few air-filled minimally dilated small bowel loops in the left mid
abdomen with air throughout the colon. Likely due to mild ileus,
although early/partial small bowel obstruction is possible.

## 2019-08-18 ENCOUNTER — Other Ambulatory Visit: Payer: Self-pay | Admitting: Cardiovascular Disease

## 2019-08-18 ENCOUNTER — Other Ambulatory Visit: Payer: Self-pay | Admitting: Gastroenterology

## 2019-08-18 DIAGNOSIS — A0471 Enterocolitis due to Clostridium difficile, recurrent: Secondary | ICD-10-CM

## 2019-08-19 ENCOUNTER — Other Ambulatory Visit: Payer: Self-pay | Admitting: Primary Care

## 2019-08-19 ENCOUNTER — Other Ambulatory Visit: Payer: Self-pay | Admitting: Infectious Diseases

## 2019-08-19 DIAGNOSIS — L03119 Cellulitis of unspecified part of limb: Secondary | ICD-10-CM

## 2019-08-19 MED ORDER — PENICILLIN V POTASSIUM 500 MG PO TABS: 500.0000 mg | ORAL_TABLET | Freq: Two times a day (BID) | ORAL | 3 refills | Status: DC

## 2019-08-19 NOTE — Telephone Encounter (Signed)
Patient called in regards to a refill request for her penicillin v potassium (VEETID) 500 MG tablet  She stated the pharmacy advised her to call ou roffice because she will need an appointment to have the medication refilled. She said that she has never needed an appointment for a refill and does not understand why this is needed now.  Patient stated she takes this medication everyday and she is completely out   She is requesting a call back from the nurse

## 2019-08-19 NOTE — Telephone Encounter (Signed)
Spoken to patient and notified her that Dr Delaine Lame is the provider who is managing this RX for patient. Patient asked if we can forward this request to Dr Delaine Lame because patient is completely out.  Will forward request.   Dr Delaine Lame, please advise. Last prescribed on 04/08/2019 #60 with 3 refills

## 2019-08-19 NOTE — Telephone Encounter (Signed)
Last office visit 11/04/2018 Last refill 11/04/2018 2 refills

## 2019-08-20 ENCOUNTER — Ambulatory Visit (INDEPENDENT_AMBULATORY_CARE_PROVIDER_SITE_OTHER): Payer: Medicare HMO

## 2019-08-20 VITALS — Wt 128.0 lb

## 2019-08-20 DIAGNOSIS — Z0000 Encounter for general adult medical examination without abnormal findings: Secondary | ICD-10-CM

## 2019-08-20 NOTE — Progress Notes (Signed)
PCP notes:  Health Maintenance: Dexa- declined   Abnormal Screenings: none   Patient concerns: none   Nurse concerns: none   Next PCP appt: none

## 2019-08-20 NOTE — Progress Notes (Signed)
Subjective:   Heather Larson is a 86 y.o. female who presents for an Initial Medicare Annual Wellness Visit.  Review of Systems: N/A      This visit is being conducted through telemedicine via telephone at the nurse health advisor's home address due to the COVID-19 pandemic. This patient has given me verbal consent via doximity to conduct this visit, patient states they are participating from their home address. Patient and myself are on the telephone call. There is no referral for this visit. Some vital signs may be absent or patient reported.    Patient identification: identified by name, DOB, and current address    Cardiac Risk Factors include: advanced age (>13men, >67 women);hypertension;dyslipidemia     Objective:    Today's Vitals   08/20/19 1011  Weight: 128 lb (58.1 kg)   Body mass index is 23.41 kg/m.  Advanced Directives 08/20/2019 06/27/2018 06/26/2018 05/31/2018 05/09/2018 05/08/2018 05/03/2018  Does Patient Have a Medical Advance Directive? Yes - No Yes No Yes Yes  Type of Paramedic of [place];Living will - - Special educational needs teacher of [place];Living will [place];Living will;Out of facility DNR (pink MOST or yellow form) [place];Living will  Does patient want to make changes to medical advance directive? - - - - No - Patient declined No - Patient declined -  Copy of [place] in Chart? Yes - validated most recent copy scanned in chart (See row information) - - No - copy requested No - copy requested No - copy requested Yes - validated most recent copy scanned in chart (See row information)  Would patient like information on creating a medical advance directive? - No - Patient declined - - No - Patient declined - -    Current Medications (verified) Outpatient Encounter Medications as of 08/20/2019  Medication Sig  . acetaminophen (TYLENOL) 325 MG tablet Take 2 tablets  (650 mg total) by mouth every 6 (six) hours as needed for mild pain (or Fever >/= 101).  Marland Kitchen atorvastatin (LIPITOR) 10 MG tablet TAKE ONE TABLET EVERY DAY  . brimonidine (ALPHAGAN) 0.2 % ophthalmic solution Place 1 drop into both eyes 3 (three) times daily. Taking BID per patient  . calcium carbonate (OSCAL) 1500 (600 Ca) MG TABS tablet Take by mouth 2 (two) times daily with a meal.  . DIFICID 200 MG TABS tablet TAKE ONE TABLET BY MOUTH TWICE DAILY ON MONDAY  WEDNESDAY AND FRIDAY  . diphenhydrAMINE HCl, Sleep, 25 MG CAPS Take 25-50 mg by mouth at bedtime as needed (sleep).  . dorzolamide (TRUSOPT) 2 % ophthalmic solution Place 1 drop into both eyes 3 (three) times daily.  . ferrous sulfate 325 (65 FE) MG EC tablet Take 325 mg by mouth daily with breakfast.  . furosemide (LASIX) 20 MG tablet TAKE 1 TABLET(20 MG) BY MOUTH EVERY OTHER DAY  . imiquimod (ALDARA) 5 % cream   . metoprolol succinate (TOPROL-XL) 100 MG 24 hr tablet TAKE 1 TABLET BY MOUTH DAILY  . Multiple Vitamins-Minerals (MULTIVITAMIN ADULT) TABS Take 1 tablet by mouth daily.  . Nutritional Supplements (NUTRITIONAL SHAKE PLUS PROTEIN PO) Take by mouth.  . penicillin v potassium (VEETID) 500 MG tablet Take 1 tablet (500 mg total) by mouth 2 (two) times daily.  . Rivaroxaban (XARELTO) 15 MG TABS tablet Take 1 tablet (15 mg total) by mouth daily.  . sodium chloride 1 g tablet Take 1 tablet (1 g total) by mouth 2 (  two) times daily with a meal.  . TRAVATAN Z 0.004 % SOLN ophthalmic solution Place 1 drop into both eyes at bedtime.   . vitamin C (ASCORBIC ACID) 500 MG tablet Take 500 mg by mouth daily.   No facility-administered encounter medications on file as of 08/20/2019.    Allergies (verified) Patient has no known allergies.   History: Past Medical History:  Diagnosis Date  . (HFpEF) heart failure with preserved ejection fraction ([place])   . Anemia   . Atrial fibrillation, permanent ([place])    on Xarelto  . Basal cell carcinoma  04/15/2019   nodular, ABOVE LEFT UPPER LIP-MOHS  . BCC (basal cell carcinoma of skin) 07/23/2017   nodular, LEFT NECK CURET X 3, CAUTERY, 5FU  . Bilateral lower extremity edema   . Bilateral lower leg cellulitis 11/11/2017  . Cancer [facility]    breast cancer at age 69 and skin  . Cellulitis    lower abdomen  . Diarrhea 01/11/2018  . Hyperlipidemia   . Hypertension   . Incontinence   . Low sodium levels   . Macular degeneration   . Malnutrition of moderate degree 11/23/2017  . SCCA (squamous cell carcinoma) of skin 07/23/2017   RIGHT LOWER LIP-CURET X 3, CAUTERY, 5FU  . SCCA (squamous cell carcinoma) of skin 07/23/2017   LEFT JAWLINE-CURET X 3, 5FU EXCISION  . Squamous cell carcinoma in situ (SCCIS) 07/23/2017   Past Surgical History:  Procedure Laterality Date  . BREAST SURGERY     right mastectomy  . COLONOSCOPY WITH PROPOFOL N/A 05/31/2018   Procedure: COLONOSCOPY WITH PROPOFOL;  Surgeon: Lin Landsman, MD;  Location: [facility] ENDOSCOPY;  Service: Gastroenterology;  Laterality: N/A;  . EYE SURGERY     x3  . 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 FECAL TRANSPLANT N/A 05/31/2018   Procedure: FECAL TRANSPLANT;  Surgeon: Lin Landsman, MD;  Location: [facility] ENDOSCOPY;  Service: Gastroenterology;  Laterality: N/A;  . IR RADIOLOGIST EVAL & MGMT  09/17/2017  . MASTECTOMY     right  . SHOULDER OPEN ROTATOR CUFF REPAIR     left  . SKIN BIOPSY     skin cancer   Family History  Problem Relation Age of Onset  . Hypertension Mother    Social History   Socioeconomic History  . Marital status: Divorced    Spouse name: Not on file  . Number of children: Not on file  . Years of education: Not on file  . Highest education level: Not on file  Occupational History  . Occupation: retired  Tobacco Use  . Smoking status: Never Smoker  . Smokeless  tobacco: Never Used  Substance and Sexual Activity  . Alcohol use: Yes    Comment: occasional wine  . Drug use: No  . Sexual activity: Not Currently  Other Topics Concern  . Not on file  Social History Narrative   Moved from [place].   Social Determinants of Health   Financial Resource Strain: Low Risk   . Difficulty of Paying Living Expenses: Not hard at all  Food Insecurity: No Food Insecurity  . Worried About Charity fundraiser in the Last Year: Never true  . Ran Out of Food in the Last Year: Never true  Transportation Needs: No Transportation Needs  . Lack of Transportation (Medical): No  . Lack of Transportation (Non-Medical): No  Physical Activity:  Inactive  . Days of Exercise per Week: 0 days  . Minutes of Exercise per Session: 0 min  Stress: No Stress Concern Present  . Feeling of Stress : Not at all  Social Connections:   . Frequency of Communication with Friends and Family:   . Frequency of Social Gatherings with Friends and Family:   . Attends Religious Services:   . Active Member of Clubs or Organizations:   . Attends Archivist Meetings:   Marland Kitchen Marital Status:     Tobacco Counseling Counseling given: Not Answered   Clinical Intake:  Pre-visit preparation completed: Yes  Pain : No/denies pain     Nutritional Risks: None Diabetes: No  How often do you need to have someone help you when you read instructions, pamphlets, or other written materials from your doctor or pharmacy?: 1 - Never What is the last grade level you completed in school?: some college  Interpreter Needed?: No  Information entered by :: CJohnson, LPN   Activities of Daily Living In your present state of health, do you have any difficulty performing the following activities: 08/20/2019  Hearing? Y  Comment some hearing issues  Vision? N  Difficulty concentrating or making decisions? N  Walking or climbing stairs? N  Dressing or bathing? N  Doing errands, shopping? N    Preparing Food and eating ? N  Using the Toilet? N  In the past six months, have you accidently leaked urine? Y  Comment wears pads daily  Do you have problems with loss of bowel control? N  Managing your Medications? Y  Comment daughter helps with this  Managing your Finances? N  Housekeeping or managing your Housekeeping? N  Some recent data might be hidden     Immunizations and Health Maintenance Immunization History  Administered Date(s) Administered  . Fluad Quad(high Dose 65+) 01/21/2019  . Influenza, High Dose Seasonal PF 01/28/2018, 01/20/2019  . Influenza, Seasonal, Injecte, Preservative Fre 01/30/2015, 01/19/2016  . Pneumococcal Polysaccharide-23 03/04/2005, 12/30/2016  . Tdap 10/28/2013  . Zoster 02/05/2012   Health Maintenance Due  Topic Date Due  . COVID-19 Vaccine (1) Never done    Patient Care Team: Pleas Koch, NP as PCP - General (Internal Medicine) Rockey Situ Kathlene November, MD as PCP - Cardiology (Cardiology)  Indicate any recent Medical Services you may have received from other than [facility] providers in the past year (date may be approximate).     Assessment:   This is a routine wellness examination for Heather Larson.  Hearing/Vision screen  Hearing Screening   125Hz  250Hz  500Hz  1000Hz  2000Hz  3000Hz  4000Hz  6000Hz  8000Hz   Right ear:           Left ear:           Vision Screening Comments: Patient gets annual eye exams   Dietary issues and exercise activities discussed: Current Exercise Habits: The patient does not participate in regular exercise at present, Exercise limited by: None identified  Goals    . Patient Stated     08/20/2019, I will maintain and continue medications as prescribed.       Depression Screen PHQ 2/9 Scores 08/20/2019  PHQ - 2 Score 0  PHQ- 9 Score 0    Fall Risk Fall Risk  08/20/2019 04/16/2018 03/13/2018  Falls in the past year? 0 0 0  Number falls in past yr: 0 0 0  Injury with Fall? 0 0 0  Risk for fall due to :  Medication side effect - -  Follow up Falls evaluation completed;Falls prevention discussed - -    Is the patient's home free of loose throw rugs in walkways, pet beds, electrical cords, etc?   yes      Grab bars in the bathroom? yes      Handrails on the stairs?   yes      Adequate lighting?   yes  Timed Get Up and Go Performed: N/A  Cognitive Function: MMSE - Mini Mental State Exam 08/20/2019  Orientation to time 5  Orientation to Place 5  Registration 3  Attention/ Calculation 5  Recall 3  Language- repeat 1       Mini Cog  Mini-Cog screen was completed. Maximum score is 22. A value of 0 denotes this part of the MMSE was not completed or the patient failed this part of the Mini-Cog screening.  Screening Tests Health Maintenance  Topic Date Due  . COVID-19 Vaccine (1) Never done  . PNA vac Low Risk Adult (2 of 2 - PCV13) 05/01/2021 (Originally 12/30/2017)  . DEXA SCAN  08/20/2023 (Originally 05/11/1990)  . INFLUENZA VACCINE  11/30/2019  . TETANUS/TDAP  10/29/2023    Qualifies for Shingles Vaccine: Yes  Cancer Screenings: Lung: Low Dose CT Chest recommended if Age 67-80 years, 30 pack-year currently smoking OR have quit w/in 15 years. Patient does not qualify. Breast: Up to date on Mammogram: No longer required   Up to date of Bone Density/Dexa: No, declined  Colorectal: completed 05/31/2018  Additional Screenings:  Hepatitis C Screening: N/A     Plan:   Patient will maintain and continue medications as prescribed.   I have personally reviewed and noted the following in the patient's chart:   . Medical and social history . Use of alcohol, tobacco or illicit drugs  . Current medications and supplements . Functional ability and status . Nutritional status . Physical activity . Advanced directives . List of other physicians . Hospitalizations, surgeries, and ER visits in previous 12 months . Vitals . Screenings to include cognitive, depression, and  falls . Referrals and appointments  In addition, I have reviewed and discussed with patient certain preventive protocols, quality metrics, and best practice recommendations. A written personalized care plan for preventive services as well as general preventive health recommendations were provided to patient.     Andrez Grime, LPN   [phone]

## 2019-08-20 NOTE — Patient Instructions (Addendum)
Heather Larson , Thank you for taking time to come for your Medicare Wellness Visit. I appreciate your ongoing commitment to your health goals. Please review the following plan we discussed and let me know if I can assist you in the future.   Screening recommendations/referrals: Colonoscopy: Up to date, completed 05/31/2018 Mammogram: no longer required Bone Density: declined Recommended yearly ophthalmology/optometry visit for glaucoma screening and checkup Recommended yearly dental visit for hygiene and checkup  Vaccinations: Influenza vaccine: Up to date, completed 01/20/2019 Pneumococcal vaccine: Up to date, completed 12/30/2016 Tdap vaccine: Up to date, completed 10/28/2013 Shingles vaccine: discussed    Advanced directives: copy in chart  Conditions/risks identified: hypertension, hyperlipidemia  Next appointment: none   Preventive Care 32 Years and Older, Female Preventive care refers to lifestyle choices and visits with your health care provider that can promote health and wellness. What does preventive care include?  A yearly physical exam. This is also called an annual well check.  Dental exams once or twice a year.  Routine eye exams. Ask your health care provider how often you should have your eyes checked.  Personal lifestyle choices, including:  Daily care of your teeth and gums.  Regular physical activity.  Eating a healthy diet.  Avoiding tobacco and drug use.  Limiting alcohol use.  Practicing safe sex.  Taking low-dose aspirin every day.  Taking vitamin and mineral supplements as recommended by your health care provider. What happens during an annual well check? The services and screenings done by your health care provider during your annual well check will depend on your age, overall health, lifestyle risk factors, and family history of disease. Counseling  Your health care provider may ask you questions about your:  Alcohol use.  Tobacco  use.  Drug use.  Emotional well-being.  Home and relationship well-being.  Sexual activity.  Eating habits.  History of falls.  Memory and ability to understand (cognition).  Work and work Statistician.  Reproductive health. Screening  You may have the following tests or measurements:  Height, weight, and BMI.  Blood pressure.  Lipid and cholesterol levels. These may be checked every 5 years, or more frequently if you are over 42 years old.  Skin check.  Lung cancer screening. You may have this screening every year starting at age 36 if you have a 30-pack-year history of smoking and currently smoke or have quit within the past 15 years.  Fecal occult blood test (FOBT) of the stool. You may have this test every year starting at age 75.  Flexible sigmoidoscopy or colonoscopy. You may have a sigmoidoscopy every 5 years or a colonoscopy every 10 years starting at age 74.  Hepatitis C blood test.  Hepatitis B blood test.  Sexually transmitted disease (STD) testing.  Diabetes screening. This is done by checking your blood sugar (glucose) after you have not eaten for a while (fasting). You may have this done every 1-3 years.  Bone density scan. This is done to screen for osteoporosis. You may have this done starting at age 34.  Mammogram. This may be done every 1-2 years. Talk to your health care provider about how often you should have regular mammograms. Talk with your health care provider about your test results, treatment options, and if necessary, the need for more tests. Vaccines  Your health care provider may recommend certain vaccines, such as:  Influenza vaccine. This is recommended every year.  Tetanus, diphtheria, and acellular pertussis (Tdap, Td) vaccine. You may need a Td  booster every 10 years.  Zoster vaccine. You may need this after age 80.  Pneumococcal 13-valent conjugate (PCV13) vaccine. One dose is recommended after age 18.  Pneumococcal  polysaccharide (PPSV23) vaccine. One dose is recommended after age 88. Talk to your health care provider about which screenings and vaccines you need and how often you need them. This information is not intended to replace advice given to you by your health care provider. Make sure you discuss any questions you have with your health care provider. Document Released: 05/14/2015 Document Revised: 01/05/2016 Document Reviewed: 02/16/2015 Elsevier Interactive Patient Education  2017 [place] Prevention in the Home Falls can cause injuries. They can happen to people of all ages. There are many things you can do to make your home safe and to help prevent falls. What can I do on the outside of my home?  Regularly fix the edges of walkways and driveways and fix any cracks.  Remove anything that might make you trip as you walk through a door, such as a raised step or threshold.  Trim any bushes or trees on the path to your home.  Use bright outdoor lighting.  Clear any walking paths of anything that might make someone trip, such as rocks or tools.  Regularly check to see if handrails are loose or broken. Make sure that both sides of any steps have handrails.  Any raised decks and porches should have guardrails on the edges.  Have any leaves, snow, or ice cleared regularly.  Use sand or salt on walking paths during winter.  Clean up any spills in your garage right away. This includes oil or grease spills. What can I do in the bathroom?  Use night lights.  Install grab bars by the toilet and in the tub and shower. Do not use towel bars as grab bars.  Use non-skid mats or decals in the tub or shower.  If you need to sit down in the shower, use a plastic, non-slip stool.  Keep the floor dry. Clean up any water that spills on the floor as soon as it happens.  Remove soap buildup in the tub or shower regularly.  Attach bath mats securely with double-sided non-slip rug  tape.  Do not have throw rugs and other things on the floor that can make you trip. What can I do in the bedroom?  Use night lights.  Make sure that you have a light by your bed that is easy to reach.  Do not use any sheets or blankets that are too big for your bed. They should not hang down onto the floor.  Have a firm chair that has side arms. You can use this for support while you get dressed.  Do not have throw rugs and other things on the floor that can make you trip. What can I do in the kitchen?  Clean up any spills right away.  Avoid walking on wet floors.  Keep items that you use a lot in easy-to-reach places.  If you need to reach something above you, use a strong step stool that has a grab bar.  Keep electrical cords out of the way.  Do not use floor polish or wax that makes floors slippery. If you must use wax, use non-skid floor wax.  Do not have throw rugs and other things on the floor that can make you trip. What can I do with my stairs?  Do not leave any items on the stairs.  Make sure that there are handrails on both sides of the stairs and use them. Fix handrails that are broken or loose. Make sure that handrails are as long as the stairways.  Check any carpeting to make sure that it is firmly attached to the stairs. Fix any carpet that is loose or worn.  Avoid having throw rugs at the top or bottom of the stairs. If you do have throw rugs, attach them to the floor with carpet tape.  Make sure that you have a light switch at the top of the stairs and the bottom of the stairs. If you do not have them, ask someone to add them for you. What else can I do to help prevent falls?  Wear shoes that:  Do not have high heels.  Have rubber bottoms.  Are comfortable and fit you well.  Are closed at the toe. Do not wear sandals.  If you use a stepladder:  Make sure that it is fully opened. Do not climb a closed stepladder.  Make sure that both sides of the  stepladder are locked into place.  Ask someone to hold it for you, if possible.  Clearly mark and make sure that you can see:  Any grab bars or handrails.  First and last steps.  Where the edge of each step is.  Use tools that help you move around (mobility aids) if they are needed. These include:  Canes.  Walkers.  Scooters.  Crutches.  Turn on the lights when you go into a dark area. Replace any light bulbs as soon as they burn out.  Set up your furniture so you have a clear path. Avoid moving your furniture around.  If any of your floors are uneven, fix them.  If there are any pets around you, be aware of where they are.  Review your medicines with your doctor. Some medicines can make you feel dizzy. This can increase your chance of falling. Ask your doctor what other things that you can do to help prevent falls. This information is not intended to replace advice given to you by your health care provider. Make sure you discuss any questions you have with your health care provider. Document Released: 02/11/2009 Document Revised: 09/23/2015 Document Reviewed: 05/22/2014 Elsevier Interactive Patient Education  2017 [employer].

## 2019-09-08 NOTE — Progress Notes (Signed)
Cardiology Office Note  Date:  09/09/2019   ID:  Heather Larson, DOB 06-26-1923, MRN [id number]  PCP:  Pleas Koch, NP   Chief Complaint  Patient presents with  . OTHER    3 month f/u no complaints today. Meds reviewed verbally with pt.    HPI:  Heather Larson is a 86 year old female with a history of  chronic atrial fibrillation, on Xarelto 15 mg daily, since October 2014 hypertension,  chronic lower extremity edema  Hyperlipidemia Radical mastectomy Who presents f/u of her  lower extremity edema and erythema, atrial fibrillation  Seen in clinic several months ago had worsening leg swelling It was recommended she increase Lasix from every other day up to daily  Normal creatinine on lab check several months ago  Discussed blood pressure with her [id number] systolic at home Today in the office systolic 0000000 Reports it was stressful getting into the office, had to ride a bus from [facility]  Typically checks it at home and is well controlled  On PCN daily for suppression of cellulitis  Salt tab daily Sodium within normal range 134 HCTZ held  Not on hydrazine it would appear  Weight down 6 pounds in past 2 years Leg swelling better  Lives at [facility] Sedentary Just starting to walk around  EKG personally reviewed by myself on todays visit Shows atrial fibrillation ventricular rate 72 bpm no significant ST-T  Other past medical history reviewed diltiazem held previously for leg swelling with improvement of her symptoms  Notes from outside cardiology indicating normal ejection fraction with biatrial enlargement Last echocardiogram 2014  Echo 08/16/2017  shows normal cardiac function   lab work reviewed with her, sodium 124 up to 126 on repeat She does add some sodium to her food  Outside echocardiogram 2014 1.Normal left ventricular size and systolic function with estimated ejection fraction of 60% to 65%. 2.Biatrial enlargement. 3.Aortic sclerosis and  trivial aortic regurgitation. 4.Mild mitral regurgitation. 5.Mild tricuspid regurgitation.  Lab work April 2019 Total cholesterol 141 LDL 56 Sodium 126 Creatinine 0.78 BUN 23 potassium 4.5 BNP 467    PMH:   has a past medical history of (HFpEF) heart failure with preserved ejection fraction ([place]), Anemia, Atrial fibrillation, permanent (HCC), Basal cell carcinoma (04/15/2019), BCC (basal cell carcinoma of skin) (07/23/2017), Bilateral lower extremity edema, Bilateral lower leg cellulitis (11/11/2017), Cancer ([place]), Cellulitis, Diarrhea (01/11/2018), Hyperlipidemia, Hypertension, Incontinence, Low sodium levels, Macular degeneration, Malnutrition of moderate degree (11/23/2017), SCCA (squamous cell carcinoma) of skin (07/23/2017), SCCA (squamous cell carcinoma) of skin (07/23/2017), and Squamous cell carcinoma in situ (SCCIS) (07/23/2017).  PSH:    Past Surgical History:  Procedure Laterality Date  . BREAST SURGERY     right mastectomy  . COLONOSCOPY WITH PROPOFOL N/A 05/31/2018   Procedure: COLONOSCOPY WITH PROPOFOL;  Surgeon: Lin Landsman, MD;  Location: [facility] ENDOSCOPY;  Service: Gastroenterology;  Laterality: N/A;  . EYE SURGERY     x3  . 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 FECAL TRANSPLANT N/A 05/31/2018   Procedure: FECAL TRANSPLANT;  Surgeon: Lin Landsman, MD;  Location: [facility] ENDOSCOPY;  Service: Gastroenterology;  Laterality: N/A;  . IR RADIOLOGIST EVAL & MGMT  09/17/2017  . MASTECTOMY     right  . SHOULDER OPEN ROTATOR CUFF REPAIR     left  . SKIN BIOPSY     skin cancer  Current Outpatient Medications  Medication Sig Dispense Refill  . acetaminophen (TYLENOL) 325 MG tablet Take 2 tablets (650 mg total) by mouth every 6 (six) hours as needed for mild pain (or Fever >/= 101).    Marland Kitchen atorvastatin (LIPITOR) 10 MG tablet TAKE  ONE TABLET EVERY DAY 90 tablet 3  . brimonidine (ALPHAGAN) 0.2 % ophthalmic solution Place 1 drop into both eyes 3 (three) times daily. Taking BID per patient  6  . calcium carbonate (OSCAL) 1500 (600 Ca) MG TABS tablet Take by mouth 2 (two) times daily with a meal.    . DIFICID 200 MG TABS tablet TAKE ONE TABLET BY MOUTH TWICE DAILY ON MONDAY  WEDNESDAY AND FRIDAY 60 tablet 2  . diphenhydrAMINE HCl, Sleep, 25 MG CAPS Take 25-50 mg by mouth at bedtime as needed (sleep).    . dorzolamide (TRUSOPT) 2 % ophthalmic solution Place 1 drop into both eyes 3 (three) times daily.  6  . ferrous sulfate 325 (65 FE) MG EC tablet Take 325 mg by mouth daily with breakfast.    . furosemide (LASIX) 20 MG tablet TAKE 1 TABLET(20 MG) BY MOUTH EVERY OTHER DAY 45 tablet 2  . imiquimod (ALDARA) 5 % cream     . metoprolol succinate (TOPROL-XL) 100 MG 24 hr tablet TAKE 1 TABLET BY MOUTH DAILY 30 tablet 3  . Multiple Vitamins-Minerals (MULTIVITAMIN ADULT) TABS Take 1 tablet by mouth daily.    . Nutritional Supplements (NUTRITIONAL SHAKE PLUS PROTEIN PO) Take by mouth.    . penicillin v potassium (VEETID) 500 MG tablet Take 1 tablet (500 mg total) by mouth 2 (two) times daily. 60 tablet 3  . Rivaroxaban (XARELTO) 15 MG TABS tablet Take 1 tablet (15 mg total) by mouth daily. 90 tablet 3  . sodium chloride 1 g tablet Take 1 tablet (1 g total) by mouth 2 (two) times daily with a meal. 180 tablet 0  . TRAVATAN Z 0.004 % SOLN ophthalmic solution Place 1 drop into both eyes at bedtime.   5  . vitamin C (ASCORBIC ACID) 500 MG tablet Take 500 mg by mouth daily.     No current facility-administered medications for this visit.     Allergies:   Patient has no known allergies.   Social History:  The patient  reports that she has never smoked. She has never used smokeless tobacco. She reports current alcohol use. She reports that she does not use drugs.   Family History:   family history includes Hypertension in her mother.     Review of Systems: Review of Systems  Constitutional: Negative.   Respiratory: Negative.   Cardiovascular: Positive for leg swelling.  Gastrointestinal: Negative.   Musculoskeletal: Positive for back pain.       Gait instability  Neurological: Negative.   Psychiatric/Behavioral: Negative.   All other systems reviewed and are negative.   PHYSICAL EXAM: VS:  BP (!) 160/80 (BP Location: Left Arm, Patient Position: Sitting, Cuff Size: Normal)   Pulse 72   Ht 5\' 2"  (1.575 m)   Wt 124 lb 4 oz (56.4 kg)   SpO2 96%   BMI 22.73 kg/m  , BMI Body mass index is 22.73 kg/m. Constitutional:  oriented to person, place, and time. No distress.  Presenting in a wheelchair HENT:  Head: Grossly normal Eyes:  no discharge. No scleral icterus.  Neck: No JVD, no carotid bruits  Cardiovascular: Regular rate and rhythm, no murmurs appreciated Trace pitting bilateral lower extremity edema Pulmonary/Chest: Clear  to auscultation bilaterally, no wheezes or rails Abdominal: Soft.  no distension.  no tenderness.  Musculoskeletal: Normal range of motion Neurological:  normal muscle tone. Coordination normal. No atrophy Skin: Skin warm and dry Psychiatric: normal affect, pleasant   Recent Labs: 11/04/2018: Hemoglobin 12.6; Platelets 309 06/12/2019: BUN 21; Creatinine, Ser 0.83; Potassium 5.2; Sodium 134    Lipid Panel Lab Results  Component Value Date   CHOL 141 07/31/2017   HDL 74.00 07/31/2017   LDLCALC 56 07/31/2017   TRIG 53.0 07/31/2017      Wt Readings from Last 3 Encounters:  09/09/19 124 lb 4 oz (56.4 kg)  08/20/19 128 lb (58.1 kg)  06/12/19 125 lb (56.7 kg)       ASSESSMENT AND PLAN:  Permanent atrial fibrillation (HCC) -  Rate well controlled on current dose of metoprolol succinate 100 daily, no changes made Tolerating anticoagulation Xarelto 15 milligrams daily, lower dose for creatinine clearance less than 50  Bilateral lower extremity edema - Plan: EKG  12-Lead Improved symptoms by holding diltiazem Would avoid calcium channel blockers in the future On Lasix daily, stable renal function Will need to be monitored periodically  Mixed hyperlipidemia Cholesterol is at goal on the current lipid regimen. No changes to the medications were made.stable  Essential hypertension Elevated blood pressure today but stressful getting into the office, rode a bus Recommend she check blood pressures at home and call us with some numbers  Hyponatremia Low secondary to HCTZ, unable to exclude other etiologies Sodium improved, also reports he takes salt tab daily  Disposition:   F/U  6 months   Total encounter time more than 25 minutes  Greater than 50% was spent in counseling and coordination of care with the patient    No orders of the defined types were placed in this encounter.    Signed, Esmond Plants, M.D., Ph.D. 09/09/2019  [place], [place]

## 2019-09-09 ENCOUNTER — Other Ambulatory Visit: Payer: Self-pay

## 2019-09-09 ENCOUNTER — Ambulatory Visit (INDEPENDENT_AMBULATORY_CARE_PROVIDER_SITE_OTHER): Payer: Medicare HMO | Admitting: Cardiovascular Disease

## 2019-09-09 ENCOUNTER — Encounter: Payer: Self-pay | Admitting: Cardiovascular Disease

## 2019-09-09 VITALS — BP 160/80 | HR 72 | Ht 62.0 in | Wt 124.2 lb

## 2019-09-09 DIAGNOSIS — R600 Localized edema: Secondary | ICD-10-CM | POA: Diagnosis not present

## 2019-09-09 DIAGNOSIS — I5032 Chronic diastolic (congestive) heart failure: Secondary | ICD-10-CM | POA: Diagnosis not present

## 2019-09-09 DIAGNOSIS — E782 Mixed hyperlipidemia: Secondary | ICD-10-CM | POA: Diagnosis not present

## 2019-09-09 DIAGNOSIS — I10 Essential (primary) hypertension: Secondary | ICD-10-CM

## 2019-09-09 DIAGNOSIS — I4821 Permanent atrial fibrillation: Secondary | ICD-10-CM | POA: Diagnosis not present

## 2019-09-09 NOTE — Patient Instructions (Addendum)
Please monitor blood pressure  Make sure to check Blood Pressures  2 hours after your medications.   How to prepare Avoid these things for 30 minutes before checking your blood pressure:  Drinking caffeine.  Drinking alcohol.  Eating.  Smoking.  Exercising. Five minutes before checking your blood pressure:  Pee.  Sit in a dining chair. Avoid sitting in a soft couch or armchair.  Be quiet. Do not talk.   Medication Instructions:  No changes  If you need a refill on your cardiac medications before your next appointment, please call your pharmacy.    Lab work: No new labs needed   If you have labs (blood work) drawn today and your tests are completely normal, you will receive your results only by: Marland Kitchen MyChart Message (if you have MyChart) OR . A paper copy in the mail If you have any lab test that is abnormal or we need to change your treatment, we will call you to review the results.   Testing/Procedures: No new testing needed   Follow-Up: At [facility], you and your health needs are our priority.  As part of our continuing mission to provide you with exceptional heart care, we have created designated Provider Care Teams.  These Care Teams include your primary Cardiologist (physician) and Advanced Practice Providers (APPs -  Physician Assistants and Nurse Practitioners) who all work together to provide you with the care you need, when you need it.  . You will need a follow up appointment in 6 months .  Marland Kitchen Providers on your designated Care Team:   . Murray Hodgkins, NP . Christell Faith, PA-C . Marrianne Mood, PA-C  Any Other Special Instructions Will Be Listed Below (If Applicable).  For educational health videos Log in to : [email or web address] Or : [email or web address], password : [email or web address]

## 2019-09-20 ENCOUNTER — Other Ambulatory Visit: Payer: Self-pay | Admitting: Physician Assistant

## 2019-09-22 NOTE — Telephone Encounter (Signed)
Pt's age 86, wt 56.4 kg, SCr 0.83, CrCl 36.9, last ov w/ TG 09/09/19

## 2019-09-22 NOTE — Telephone Encounter (Signed)
Refill Request.  

## 2019-09-26 ENCOUNTER — Telehealth: Payer: Self-pay

## 2019-09-26 NOTE — Telephone Encounter (Signed)
Heather Larson left v/m that pthas small injury to rt forearm; bled a little bit but bleeding has stopped; now leaking clear fluid. Wants to know if pt needs to be seen. Last tdap was 10/28/2013. I called to get additional info and Mr Corky Mull said that Heather Larson stepped out and does not have cell phone and he will have her cb later. No available appts at [facility] .

## 2019-09-26 NOTE — Telephone Encounter (Signed)
I spoke with [name]; pt bumped rt forearm on cabinet door on 09/25/19; [name] has not seen yet. Has had an outbreak of norovirus at [facility] retirement facility. bandaids applied by staff are getting wet. pts rt arm and hand has been swelling more so than usual. There is no redness and no red streaks and does not have pain. Pt is at [facility] retirement facility. Due to norovirus outbreak [name] has not been going to facility; Shauna Hugh is going to call one of the personal at [facility] to have them send a picture to Shauna Hugh and Shauna Hugh will send by mychart to Gentry Fitz NP. [name] will call and let Anda Kraft know when picture is sent. FYI to Gentry Fitz NP and Vallarie Mare CMA.

## 2019-09-26 NOTE — Telephone Encounter (Signed)
Noted, see my chart message. 

## 2019-09-26 NOTE — Telephone Encounter (Signed)
Noted. When Heather Larson returns our call, have her send me a picture of Heather Larson's wound via My Chart.

## 2019-09-26 NOTE — Telephone Encounter (Signed)
Patient's Daughter called, she said if anything further is needed to feel free to call the patient.

## 2019-09-26 NOTE — Telephone Encounter (Signed)
Pt has called triage and said she does not think it is superficial. It is draining and does not know how to get it to stop. Going on for 2 days. She was thinking it needs a stitch. I advised her no stitch would be done this late if it has been going on for 2 days. I told her I thought she needed to go to an UC. She si asking what to do. [phone]

## 2019-10-06 DIAGNOSIS — L821 Other seborrheic keratosis: Secondary | ICD-10-CM | POA: Diagnosis not present

## 2019-10-06 DIAGNOSIS — L820 Inflamed seborrheic keratosis: Secondary | ICD-10-CM | POA: Diagnosis not present

## 2019-10-13 ENCOUNTER — Telehealth: Payer: Self-pay | Admitting: Primary Care

## 2019-10-13 NOTE — Telephone Encounter (Signed)
Message left for patient to return my call.  

## 2019-10-13 NOTE — Telephone Encounter (Signed)
Please notify patient and her mother that I received orders for OT for [facility]. I'm happy to sign but she needs an office visit for follow up as it's nearly been one year since we've caught up. Please schedule.

## 2019-10-14 NOTE — Telephone Encounter (Signed)
Spoken and notified patient of Heather Larson comments. Verbalized understanding. Patient will have to call back, she stated that [name] is sick right now with a cold or sinus infection.

## 2019-10-16 DIAGNOSIS — R601 Generalized edema: Secondary | ICD-10-CM | POA: Diagnosis not present

## 2019-10-16 DIAGNOSIS — R600 Localized edema: Secondary | ICD-10-CM | POA: Diagnosis not present

## 2019-10-16 DIAGNOSIS — N3946 Mixed incontinence: Secondary | ICD-10-CM | POA: Diagnosis not present

## 2019-10-16 DIAGNOSIS — M6281 Muscle weakness (generalized): Secondary | ICD-10-CM | POA: Diagnosis not present

## 2019-10-21 ENCOUNTER — Telehealth: Payer: Self-pay | Admitting: Primary Care

## 2019-10-21 DIAGNOSIS — R600 Localized edema: Secondary | ICD-10-CM | POA: Diagnosis not present

## 2019-10-21 DIAGNOSIS — N3946 Mixed incontinence: Secondary | ICD-10-CM | POA: Diagnosis not present

## 2019-10-21 DIAGNOSIS — R601 Generalized edema: Secondary | ICD-10-CM | POA: Diagnosis not present

## 2019-10-21 DIAGNOSIS — M6281 Muscle weakness (generalized): Secondary | ICD-10-CM | POA: Diagnosis not present

## 2019-10-21 NOTE — Telephone Encounter (Signed)
Pt already had phone call with Dewitt Hoes in April. She will only need follow up with Anda Kraft.

## 2019-10-21 NOTE — Telephone Encounter (Signed)
Patient called in regards to appt she had in April with medicare wellness nurse She stated she had questions and wanted to speak with the nurse.  Asked patient if there was something I could help her with since it was in regards an appointment   She said that the person who did this with her was going to mail out the information to her that they went over.   I advised the patient I could send a message over to [facility] to check on this for her.  Because she is not in our office.  Patient declined, she stated she just needed to speak with the nurse., Tried to offer assistance since this would be something Dewitt Hoes would need to take care of but patient only wanted to speak with her nurse

## 2019-10-21 NOTE — Telephone Encounter (Signed)
Spoken to patient and explain the role of what Calandra does. Patient is okay to do the phone call and I noticed that this is cancel. Can reschedule patient at the same time and same day again with Calandra?

## 2019-10-23 ENCOUNTER — Other Ambulatory Visit: Payer: Self-pay | Admitting: Physician Assistant

## 2019-10-23 DIAGNOSIS — R600 Localized edema: Secondary | ICD-10-CM | POA: Diagnosis not present

## 2019-10-23 DIAGNOSIS — R601 Generalized edema: Secondary | ICD-10-CM | POA: Diagnosis not present

## 2019-10-23 DIAGNOSIS — M6281 Muscle weakness (generalized): Secondary | ICD-10-CM | POA: Diagnosis not present

## 2019-10-23 DIAGNOSIS — N3946 Mixed incontinence: Secondary | ICD-10-CM | POA: Diagnosis not present

## 2019-10-23 NOTE — Telephone Encounter (Signed)
Called patient.  No answer. LMOV to call back.   Per Last office note - Dr. Rockey Situ states patient is taking Lasix daily.  Pharmacy is requesting every other day.

## 2019-10-27 DIAGNOSIS — R600 Localized edema: Secondary | ICD-10-CM | POA: Diagnosis not present

## 2019-10-27 DIAGNOSIS — N3946 Mixed incontinence: Secondary | ICD-10-CM | POA: Diagnosis not present

## 2019-10-27 DIAGNOSIS — R601 Generalized edema: Secondary | ICD-10-CM | POA: Diagnosis not present

## 2019-10-27 DIAGNOSIS — M6281 Muscle weakness (generalized): Secondary | ICD-10-CM | POA: Diagnosis not present

## 2019-10-30 DIAGNOSIS — N3946 Mixed incontinence: Secondary | ICD-10-CM | POA: Diagnosis not present

## 2019-10-30 DIAGNOSIS — M6281 Muscle weakness (generalized): Secondary | ICD-10-CM | POA: Diagnosis not present

## 2019-10-30 DIAGNOSIS — R601 Generalized edema: Secondary | ICD-10-CM | POA: Diagnosis not present

## 2019-10-30 DIAGNOSIS — R600 Localized edema: Secondary | ICD-10-CM | POA: Diagnosis not present

## 2019-11-03 DIAGNOSIS — R600 Localized edema: Secondary | ICD-10-CM | POA: Diagnosis not present

## 2019-11-03 DIAGNOSIS — N3946 Mixed incontinence: Secondary | ICD-10-CM | POA: Diagnosis not present

## 2019-11-03 DIAGNOSIS — R601 Generalized edema: Secondary | ICD-10-CM | POA: Diagnosis not present

## 2019-11-03 DIAGNOSIS — M6281 Muscle weakness (generalized): Secondary | ICD-10-CM | POA: Diagnosis not present

## 2019-11-05 ENCOUNTER — Ambulatory Visit: Payer: Medicare HMO

## 2019-11-05 DIAGNOSIS — M6281 Muscle weakness (generalized): Secondary | ICD-10-CM | POA: Diagnosis not present

## 2019-11-05 DIAGNOSIS — N3946 Mixed incontinence: Secondary | ICD-10-CM | POA: Diagnosis not present

## 2019-11-05 DIAGNOSIS — R601 Generalized edema: Secondary | ICD-10-CM | POA: Diagnosis not present

## 2019-11-05 DIAGNOSIS — R600 Localized edema: Secondary | ICD-10-CM | POA: Diagnosis not present

## 2019-11-10 DIAGNOSIS — N3946 Mixed incontinence: Secondary | ICD-10-CM | POA: Diagnosis not present

## 2019-11-10 DIAGNOSIS — R600 Localized edema: Secondary | ICD-10-CM | POA: Diagnosis not present

## 2019-11-10 DIAGNOSIS — R601 Generalized edema: Secondary | ICD-10-CM | POA: Diagnosis not present

## 2019-11-10 DIAGNOSIS — M6281 Muscle weakness (generalized): Secondary | ICD-10-CM | POA: Diagnosis not present

## 2019-11-11 ENCOUNTER — Other Ambulatory Visit: Payer: Self-pay

## 2019-11-11 ENCOUNTER — Ambulatory Visit (INDEPENDENT_AMBULATORY_CARE_PROVIDER_SITE_OTHER): Payer: Medicare HMO | Admitting: Primary Care

## 2019-11-11 ENCOUNTER — Encounter: Payer: Self-pay | Admitting: Primary Care

## 2019-11-11 VITALS — BP 134/86 | HR 80 | Temp 95.8°F | Ht 62.0 in | Wt 120.5 lb

## 2019-11-11 DIAGNOSIS — L03119 Cellulitis of unspecified part of limb: Secondary | ICD-10-CM

## 2019-11-11 DIAGNOSIS — E782 Mixed hyperlipidemia: Secondary | ICD-10-CM

## 2019-11-11 DIAGNOSIS — H3530 Unspecified macular degeneration: Secondary | ICD-10-CM

## 2019-11-11 DIAGNOSIS — I482 Chronic atrial fibrillation, unspecified: Secondary | ICD-10-CM | POA: Diagnosis not present

## 2019-11-11 DIAGNOSIS — I5032 Chronic diastolic (congestive) heart failure: Secondary | ICD-10-CM | POA: Diagnosis not present

## 2019-11-11 DIAGNOSIS — R32 Unspecified urinary incontinence: Secondary | ICD-10-CM | POA: Diagnosis not present

## 2019-11-11 DIAGNOSIS — I10 Essential (primary) hypertension: Secondary | ICD-10-CM | POA: Diagnosis not present

## 2019-11-11 DIAGNOSIS — I890 Lymphedema, not elsewhere classified: Secondary | ICD-10-CM

## 2019-11-11 DIAGNOSIS — D509 Iron deficiency anemia, unspecified: Secondary | ICD-10-CM

## 2019-11-11 DIAGNOSIS — Z0000 Encounter for general adult medical examination without abnormal findings: Secondary | ICD-10-CM | POA: Diagnosis not present

## 2019-11-11 DIAGNOSIS — R600 Localized edema: Secondary | ICD-10-CM | POA: Diagnosis not present

## 2019-11-11 DIAGNOSIS — M8000XD Age-related osteoporosis with current pathological fracture, unspecified site, subsequent encounter for fracture with routine healing: Secondary | ICD-10-CM

## 2019-11-11 DIAGNOSIS — Z8619 Personal history of other infectious and parasitic diseases: Secondary | ICD-10-CM

## 2019-11-11 LAB — CBC
HCT: 34.7 % — ABNORMAL LOW (ref 36.0–46.0)
Hemoglobin: 11 g/dL — ABNORMAL LOW (ref 12.0–15.0)
MCHC: 31.7 g/dL (ref 30.0–36.0)
MCV: 76 fl — ABNORMAL LOW (ref 78.0–100.0)
Platelets: 502 10*3/uL — ABNORMAL HIGH (ref 150.0–400.0)
RBC: 4.57 Mil/uL (ref 3.87–5.11)
RDW: 17.4 % — ABNORMAL HIGH (ref 11.5–15.5)
WBC: 8.5 10*3/uL (ref 4.0–10.5)

## 2019-11-11 LAB — COMPREHENSIVE METABOLIC PANEL
ALT: 12 U/L (ref 0–35)
AST: 20 U/L (ref 0–37)
Albumin: 3.8 g/dL (ref 3.5–5.2)
Alkaline Phosphatase: 67 U/L (ref 39–117)
BUN: 16 mg/dL (ref 6–23)
CO2: 29 mEq/L (ref 19–32)
Calcium: 9.8 mg/dL (ref 8.4–10.5)
Chloride: 96 mEq/L (ref 96–112)
Creatinine, Ser: 0.68 mg/dL (ref 0.40–1.20)
GFR: 80.48 mL/min (ref >60.00)
Glucose, Bld: 98 mg/dL (ref 70–99)
Potassium: 4.3 mEq/L (ref 3.5–5.1)
Sodium: 131 mEq/L — ABNORMAL LOW (ref 135–145)
Total Bilirubin: 0.5 mg/dL (ref 0.2–1.2)
Total Protein: 7.2 g/dL (ref 6.0–8.3)

## 2019-11-11 LAB — LIPID PANEL
Cholesterol: 127 mg/dL (ref 0–200)
HDL: 63 mg/dL (ref >39.00)
LDL Cholesterol: 55 mg/dL (ref 0–99)
NonHDL: 63.96
Total CHOL/HDL Ratio: 2
Triglycerides: 45 mg/dL (ref 0.0–149.0)
VLDL: 9 mg/dL (ref 0.0–40.0)

## 2019-11-11 NOTE — Assessment & Plan Note (Signed)
Well controlled in the office today. Continue metoprolol succinate.

## 2019-11-11 NOTE — Assessment & Plan Note (Signed)
Well controlled on furosemide. Continue to monitor.

## 2019-11-11 NOTE — Progress Notes (Signed)
Subjective:    Patient ID: Heather Larson, female    DOB: June 22, 1923, 86 y.o.   MRN: [id number]  HPI  This visit occurred during the SARS-CoV-2 public health emergency.  Safety protocols were in place, including screening questions prior to the visit, additional usage of staff PPE, and extensive cleaning of exam room while observing appropriate contact time as indicated for disinfecting solutions.   Heather Larson is a 86 year old female who presents today for complete physical.  Immunizations: -Tetanus: Completed in 2015 -Influenza: Completed last season  -Shingles: Completed Zostavax -Pneumonia: Completed series -Covid-19: Completed series   Diet: She endorses a healthy diet.  Exercise: She is active in her assisted living facility   Eye exam: Completed in 2021 Dental exam: Completes annually   Mammogram: N/A due to age Dexa: No recent testing, compliant to calcium and vitamin D  Colonoscopy: Completed in 2020 Hep C Screen: Negative  BP Readings from Last 3 Encounters:  11/11/19 134/86  09/09/19 (!) 160/80  06/12/19 130/70   Wt Readings from Last 3 Encounters:  11/11/19 120 lb 8 oz (54.7 kg)  09/09/19 124 lb 4 oz (56.4 kg)  08/20/19 128 lb (58.1 kg)     Review of Systems  Constitutional: Negative for unexpected weight change.  HENT: Negative for rhinorrhea.   Respiratory: Negative for shortness of breath.   Cardiovascular: Negative for chest pain.  Gastrointestinal: Negative for constipation and diarrhea.  Genitourinary: Negative for difficulty urinating.       Urinary frequency since on furosemide   Musculoskeletal: Positive for arthralgias.       Chronic finger and upper extremity swelling, working with OT at [facility]   Skin: Negative for rash.  Allergic/Immunologic: Negative for environmental allergies.  Neurological: Negative for dizziness, numbness and headaches.  Psychiatric/Behavioral: The patient is not nervous/anxious.        Past Medical History:   Diagnosis Date   (HFpEF) heart failure with preserved ejection fraction (HCC)    Anemia    Atrial fibrillation, permanent (HCC)    on Xarelto   Basal cell carcinoma 04/15/2019   nodular, ABOVE LEFT UPPER LIP-MOHS   BCC (basal cell carcinoma of skin) 07/23/2017   nodular, LEFT NECK CURET X 3, CAUTERY, 5FU   Bilateral lower extremity edema    Bilateral lower leg cellulitis 11/11/2017   Cancer ([place])    breast cancer at age 7 and skin   Cellulitis    lower abdomen   Diarrhea 01/11/2018   Hyperlipidemia    Hypertension    Incontinence    Low sodium levels    Macular degeneration    Malnutrition of moderate degree 11/23/2017   SCCA (squamous cell carcinoma) of skin 07/23/2017   RIGHT LOWER LIP-CURET X 3, CAUTERY, 5FU   SCCA (squamous cell carcinoma) of skin 07/23/2017   LEFT JAWLINE-CURET X 3, 5FU EXCISION   Squamous cell carcinoma in situ (SCCIS) 07/23/2017     Social History   Socioeconomic History   Marital status: Divorced    Spouse name: Not on file   Number of children: Not on file   Years of education: Not on file   Highest education level: Not on file  Occupational History   Occupation: retired  Tobacco Use   Smoking status: Never Smoker   Smokeless tobacco: Never Used  Scientific laboratory technician Use: Never used  Substance and Sexual Activity   Alcohol use: Yes    Comment: occasional wine   Drug use: No  Sexual activity: Not Currently  Other Topics Concern   Not on file  Social History Narrative   Moved from [place].   Social Determinants of Health   Financial Resource Strain: Low Risk    Difficulty of Paying Living Expenses: Not hard at all  Food Insecurity: No Food Insecurity   Worried About Charity fundraiser in the Last Year: Never true   [place] in the Last Year: Never true  Transportation Needs: No Transportation Needs   Lack of Transportation (Medical): No   Lack of Transportation (Non-Medical): No   Physical Activity: Inactive   Days of Exercise per Week: 0 days   Minutes of Exercise per Session: 0 min  Stress: No Stress Concern Present   Feeling of Stress : Not at all  Social Connections:    Frequency of Communication with Friends and Family:    Frequency of Social Gatherings with Friends and Family:    Attends Religious Services:    Active Member of Clubs or Organizations:    Attends Music therapist:    Marital Status:   Intimate Partner Violence: Not At Risk   Fear of Current or Ex-Partner: No   Emotionally Abused: No   Physically Abused: No   Sexually Abused: No    Past Surgical History:  Procedure Laterality Date   BREAST SURGERY     right mastectomy   COLONOSCOPY WITH PROPOFOL N/A 05/31/2018   Procedure: COLONOSCOPY WITH PROPOFOL;  Surgeon: Lin Landsman, MD;  Location: [facility] ENDOSCOPY;  Service: Gastroenterology;  Laterality: N/A;   EYE SURGERY     x3   FECAL TRANSPLANT N/A 05/03/2018   Procedure: FECAL TRANSPLANT;  Surgeon: Virgel Manifold, MD;  Location: [facility] ENDOSCOPY;  Service: Gastroenterology;  Laterality: N/A;   FECAL TRANSPLANT N/A 05/31/2018   Procedure: FECAL TRANSPLANT;  Surgeon: Lin Landsman, MD;  Location: [facility] ENDOSCOPY;  Service: Gastroenterology;  Laterality: N/A;   IR RADIOLOGIST EVAL & MGMT  09/17/2017   MASTECTOMY     right   SHOULDER OPEN ROTATOR CUFF REPAIR     left   SKIN BIOPSY     skin cancer    Family History  Problem Relation Age of Onset   Hypertension Mother     No Known Allergies  Current Outpatient Medications on File Prior to Visit  Medication Sig Dispense Refill   acetaminophen (TYLENOL) 325 MG tablet Take 2 tablets (650 mg total) by mouth every 6 (six) hours as needed for mild pain (or Fever >/= 101).     atorvastatin (LIPITOR) 10 MG tablet TAKE ONE TABLET EVERY DAY 90 tablet 3   brimonidine (ALPHAGAN) 0.2 % ophthalmic solution Place 1 drop into both eyes 3 (three)  times daily. Taking BID per patient  6   calcium carbonate (OSCAL) 1500 (600 Ca) MG TABS tablet Take by mouth 2 (two) times daily with a meal.     DIFICID 200 MG TABS tablet TAKE ONE TABLET BY MOUTH TWICE DAILY ON MONDAY  WEDNESDAY AND FRIDAY 60 tablet 2   diphenhydrAMINE HCl, Sleep, 25 MG CAPS Take 25-50 mg by mouth at bedtime as needed (sleep).     dorzolamide (TRUSOPT) 2 % ophthalmic solution Place 1 drop into both eyes 3 (three) times daily.  6   ferrous sulfate 325 (65 FE) MG EC tablet Take 325 mg by mouth daily with breakfast.     furosemide (LASIX) 20 MG tablet TAKE ONE TABLET BY MOUTH EVERY OTHER DAY 45 tablet  2   imiquimod (ALDARA) 5 % cream      metoprolol succinate (TOPROL-XL) 100 MG 24 hr tablet TAKE 1 TABLET BY MOUTH DAILY 30 tablet 3   Multiple Vitamins-Minerals (MULTIVITAMIN ADULT) TABS Take 1 tablet by mouth daily.     Nutritional Supplements (NUTRITIONAL SHAKE PLUS PROTEIN PO) Take by mouth.     penicillin v potassium (VEETID) 500 MG tablet Take 1 tablet (500 mg total) by mouth 2 (two) times daily. 60 tablet 3   sodium chloride 1 g tablet Take 1 tablet (1 g total) by mouth 2 (two) times daily with a meal. 180 tablet 0   TRAVATAN Z 0.004 % SOLN ophthalmic solution Place 1 drop into both eyes at bedtime.   5   vitamin C (ASCORBIC ACID) 500 MG tablet Take 500 mg by mouth daily.     XARELTO 15 MG TABS tablet TAKE ONE TABLET EVERY DAY 90 tablet 1   No current facility-administered medications on file prior to visit.    BP 134/86    Pulse 80    Temp (!) 95.8 F (35.4 C) (Temporal)    Ht 5\' 2"  (1.575 m)    Wt 120 lb 8 oz (54.7 kg)    SpO2 98%    BMI 22.04 kg/m    Objective:   Physical Exam HENT:     Right Ear: Tympanic membrane and ear canal normal.     Left Ear: Tympanic membrane and ear canal normal.  Eyes:     Pupils: Pupils are equal, round, and reactive to light.  Cardiovascular:     Rate and Rhythm: Normal rate and regular rhythm.  Pulmonary:      Effort: Pulmonary effort is normal.     Breath sounds: Normal breath sounds.  Abdominal:     General: Bowel sounds are normal.     Palpations: Abdomen is soft.     Tenderness: There is no abdominal tenderness.  Musculoskeletal:        General: Normal range of motion.     Cervical back: Neck supple.     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     Comments: Moderate lymphedema to right upper extremity  Neurological:     Mental Status: She is alert and oriented to person, place, and time.     Cranial Nerves: No cranial nerve deficit.     Deep Tendon Reflexes:     Reflex Scores:      Patellar reflexes are 2+ on the right side and 2+ on the left side. Psychiatric:        Mood and Affect: Mood normal.            Assessment & Plan:

## 2019-11-11 NOTE — Assessment & Plan Note (Signed)
Following with ophthalmology, continue current regimen. 

## 2019-11-11 NOTE — Assessment & Plan Note (Signed)
Appears euvolemic today. She is experiencing urinary frequency on furosemide 20 mg every other day. She will call cardiology to request a 10 mg dose daily.  CMP pending. Continue metoprolol succinate.

## 2019-11-11 NOTE — Assessment & Plan Note (Signed)
Continued, worse on furosemide. She will be contacting cardiology for approval to reduce furosemide dose to 10 mg.

## 2019-11-11 NOTE — Assessment & Plan Note (Signed)
Following with cardiology, compliant to Xarelto and metoprolol succinate. Continue same.

## 2019-11-11 NOTE — Assessment & Plan Note (Signed)
Immunizations UTD. Bone density overdue, compliant to vitamin D and calcium, encouraged weight bearing activity. Colonoscopy UTD, likely will not need repeat at this point. Encouraged regular activity, healthy diet. Exam today stable. Labs pending.

## 2019-11-11 NOTE — Patient Instructions (Signed)
Stop by the lab prior to leaving today. I will notify you of your results once received.   Continue to walk daily for bone support and strength.  Continue to eat a healthy diet. Ensure you are consuming plenty of water during the day.  Please send me the dates of your Covid-19 vaccines when you get a moment.   It was a pleasure to see you today!   Preventive Care 84 Years and Older, Female Preventive care refers to lifestyle choices and visits with your health care provider that can promote health and wellness. This includes:  A yearly physical exam. This is also called an annual well check.  Regular dental and eye exams.  Immunizations.  Screening for certain conditions.  Healthy lifestyle choices, such as diet and exercise. What can I expect for my preventive care visit? Physical exam Your health care provider will check:  Height and weight. These may be used to calculate body mass index (BMI), which is a measurement that tells if you are at a healthy weight.  Heart rate and blood pressure.  Your skin for abnormal spots. Counseling Your health care provider may ask you questions about:  Alcohol, tobacco, and drug use.  Emotional well-being.  Home and relationship well-being.  Sexual activity.  Eating habits.  History of falls.  Memory and ability to understand (cognition).  Work and work Statistician.  Pregnancy and menstrual history. What immunizations do I need?  Influenza (flu) vaccine  This is recommended every year. Tetanus, diphtheria, and pertussis (Tdap) vaccine  You may need a Td booster every 10 years. Varicella (chickenpox) vaccine  You may need this vaccine if you have not already been vaccinated. Zoster (shingles) vaccine  You may need this after age 84. Pneumococcal conjugate (PCV13) vaccine  One dose is recommended after age 84. Pneumococcal polysaccharide (PPSV23) vaccine  One dose is recommended after age 84. Measles, mumps,  and rubella (MMR) vaccine  You may need at least one dose of MMR if you were born in 1957 or later. You may also need a second dose. Meningococcal conjugate (MenACWY) vaccine  You may need this if you have certain conditions. Hepatitis A vaccine  You may need this if you have certain conditions or if you travel or work in places where you may be exposed to hepatitis A. Hepatitis B vaccine  You may need this if you have certain conditions or if you travel or work in places where you may be exposed to hepatitis B. Haemophilus influenzae type b (Hib) vaccine  You may need this if you have certain conditions. You may receive vaccines as individual doses or as more than one vaccine together in one shot (combination vaccines). Talk with your health care provider about the risks and benefits of combination vaccines. What tests do I need? Blood tests  Lipid and cholesterol levels. These may be checked every 5 years, or more frequently depending on your overall health.  Hepatitis C test.  Hepatitis B test. Screening  Lung cancer screening. You may have this screening every year starting at age 84 if you have a 30-pack-year history of smoking and currently smoke or have quit within the past 15 years.  Colorectal cancer screening. All adults should have this screening starting at age 84 and continuing until age 27. Your health care provider may recommend screening at age 56 if you are at increased risk. You will have tests every 1-10 years, depending on your results and the type of screening test.  Diabetes screening. This is done by checking your blood sugar (glucose) after you have not eaten for a while (fasting). You may have this done every 1-3 years.  Mammogram. This may be done every 1-2 years. Talk with your health care provider about how often you should have regular mammograms.  BRCA-related cancer screening. This may be done if you have a family history of breast, ovarian, tubal, or  peritoneal cancers. Other tests  Sexually transmitted disease (STD) testing.  Bone density scan. This is done to screen for osteoporosis. You may have this done starting at age 84. Follow these instructions at home: Eating and drinking  Eat a diet that includes fresh fruits and vegetables, whole grains, lean protein, and low-fat dairy products. Limit your intake of foods with high amounts of sugar, saturated fats, and salt.  Take vitamin and mineral supplements as recommended by your health care provider.  Do not drink alcohol if your health care provider tells you not to drink.  If you drink alcohol: ? Limit how much you have to 0-1 drink a day. ? Be aware of how much alcohol is in your drink. In the [place] one drink equals one 12 oz bottle of beer (355 mL), one 5 oz glass of wine (148 mL), or one 1 oz glass of hard liquor (44 mL). Lifestyle  Take daily care of your teeth and gums.  Stay active. Exercise for at least 30 minutes on 5 or more days each week.  Do not use any products that contain nicotine or tobacco, such as cigarettes, e-cigarettes, and chewing tobacco. If you need help quitting, ask your health care provider.  If you are sexually active, practice safe sex. Use a condom or other form of protection in order to prevent STIs (sexually transmitted infections).  Talk with your health care provider about taking a low-dose aspirin or statin. What's next?  Go to your health care provider once a year for a well check visit.  Ask your health care provider how often you should have your eyes and teeth checked.  Stay up to date on all vaccines. This information is not intended to replace advice given to you by your health care provider. Make sure you discuss any questions you have with your health care provider. Document Revised: 04/11/2018 Document Reviewed: 04/11/2018 Elsevier Patient Education  2020 [employer].

## 2019-11-11 NOTE — Assessment & Plan Note (Signed)
Evident today to right upper extremity. Working with OT at [facility].

## 2019-11-11 NOTE — Assessment & Plan Note (Signed)
Doing well on daily penicillin V. Continue same. No recent cellulitis.

## 2019-11-11 NOTE — Assessment & Plan Note (Signed)
Compliant to atorvastatin, repeat lipid panel pending today.

## 2019-11-11 NOTE — Assessment & Plan Note (Signed)
Compliant to calcium and vitamin D. Encouraged weight bearing activity.

## 2019-11-11 NOTE — Assessment & Plan Note (Signed)
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 Following with GI.

## 2019-11-12 ENCOUNTER — Telehealth: Payer: Self-pay | Admitting: Cardiovascular Disease

## 2019-11-12 MED ORDER — FUROSEMIDE 20 MG PO TABS: 10.0000 mg | ORAL_TABLET | ORAL | 3 refills | Status: DC

## 2019-11-12 NOTE — Telephone Encounter (Signed)
Pt c/o medication issue:  1. Name of Medication: furosemide   2. How are you currently taking this medication (dosage and times per day)? 10 mg po every other day but thinks pharmacy may have been filling as 20 mg po every other day ( old rx )   3. Are you having a reaction (difficulty breathing--STAT)? No   4. What is your medication issue?  Patient states pcp wants her to discuss changing dose as she has had about 10 lbs weight loss and frequently voiding as well.    If no answer please leave detailed msg and also send correct dose to pharmacy so they can fill patient weekly dose packs

## 2019-11-12 NOTE — Telephone Encounter (Signed)
Spoke with patient and she states she had to cut back on her furosemide to 1/2 tablet (10 mg) every other day due to extreme weight loss. She wanted me to update her prescription to reflect this due to her pharmacy packaging her pills. Reviewed signs and symptoms which would require [place] to increase back to whole tablet every other day. She states this was previously discussed with provider. Advised that I would make him aware of the decrease and to please call back if we need to change it.

## 2019-11-13 DIAGNOSIS — R600 Localized edema: Secondary | ICD-10-CM | POA: Diagnosis not present

## 2019-11-13 DIAGNOSIS — R601 Generalized edema: Secondary | ICD-10-CM | POA: Diagnosis not present

## 2019-11-13 DIAGNOSIS — N3946 Mixed incontinence: Secondary | ICD-10-CM | POA: Diagnosis not present

## 2019-11-13 DIAGNOSIS — M6281 Muscle weakness (generalized): Secondary | ICD-10-CM | POA: Diagnosis not present

## 2019-11-16 NOTE — Telephone Encounter (Signed)
Okay to decrease dose as below

## 2019-11-17 NOTE — Telephone Encounter (Signed)
Dose decrease updated and new script sent in to reflect those changes.

## 2019-11-19 DIAGNOSIS — M6281 Muscle weakness (generalized): Secondary | ICD-10-CM | POA: Diagnosis not present

## 2019-11-19 DIAGNOSIS — N3946 Mixed incontinence: Secondary | ICD-10-CM | POA: Diagnosis not present

## 2019-11-19 DIAGNOSIS — R600 Localized edema: Secondary | ICD-10-CM | POA: Diagnosis not present

## 2019-11-19 DIAGNOSIS — R601 Generalized edema: Secondary | ICD-10-CM | POA: Diagnosis not present

## 2019-11-20 DIAGNOSIS — R600 Localized edema: Secondary | ICD-10-CM | POA: Diagnosis not present

## 2019-11-20 DIAGNOSIS — M6281 Muscle weakness (generalized): Secondary | ICD-10-CM | POA: Diagnosis not present

## 2019-11-20 DIAGNOSIS — N3946 Mixed incontinence: Secondary | ICD-10-CM | POA: Diagnosis not present

## 2019-11-20 DIAGNOSIS — R601 Generalized edema: Secondary | ICD-10-CM | POA: Diagnosis not present

## 2019-11-24 DIAGNOSIS — R600 Localized edema: Secondary | ICD-10-CM | POA: Diagnosis not present

## 2019-11-24 DIAGNOSIS — M6281 Muscle weakness (generalized): Secondary | ICD-10-CM | POA: Diagnosis not present

## 2019-11-24 DIAGNOSIS — N3946 Mixed incontinence: Secondary | ICD-10-CM | POA: Diagnosis not present

## 2019-11-24 DIAGNOSIS — R601 Generalized edema: Secondary | ICD-10-CM | POA: Diagnosis not present

## 2019-11-26 DIAGNOSIS — M6281 Muscle weakness (generalized): Secondary | ICD-10-CM | POA: Diagnosis not present

## 2019-11-26 DIAGNOSIS — R601 Generalized edema: Secondary | ICD-10-CM | POA: Diagnosis not present

## 2019-11-26 DIAGNOSIS — R600 Localized edema: Secondary | ICD-10-CM | POA: Diagnosis not present

## 2019-11-26 DIAGNOSIS — N3946 Mixed incontinence: Secondary | ICD-10-CM | POA: Diagnosis not present

## 2019-12-03 DIAGNOSIS — R600 Localized edema: Secondary | ICD-10-CM | POA: Diagnosis not present

## 2019-12-03 DIAGNOSIS — M6281 Muscle weakness (generalized): Secondary | ICD-10-CM | POA: Diagnosis not present

## 2019-12-03 DIAGNOSIS — N3946 Mixed incontinence: Secondary | ICD-10-CM | POA: Diagnosis not present

## 2019-12-03 DIAGNOSIS — R601 Generalized edema: Secondary | ICD-10-CM | POA: Diagnosis not present

## 2019-12-04 DIAGNOSIS — R601 Generalized edema: Secondary | ICD-10-CM | POA: Diagnosis not present

## 2019-12-04 DIAGNOSIS — M6281 Muscle weakness (generalized): Secondary | ICD-10-CM | POA: Diagnosis not present

## 2019-12-04 DIAGNOSIS — N3946 Mixed incontinence: Secondary | ICD-10-CM | POA: Diagnosis not present

## 2019-12-04 DIAGNOSIS — R600 Localized edema: Secondary | ICD-10-CM | POA: Diagnosis not present

## 2019-12-08 IMAGING — DX DG CHEST 2V
2 series · 2 of 2 positions shown · non-contrast
Comparison: 03/04/2018

CLINICAL DATA: Cough and fevers

EXAM:
CHEST - 2 VIEW

[chest pa]
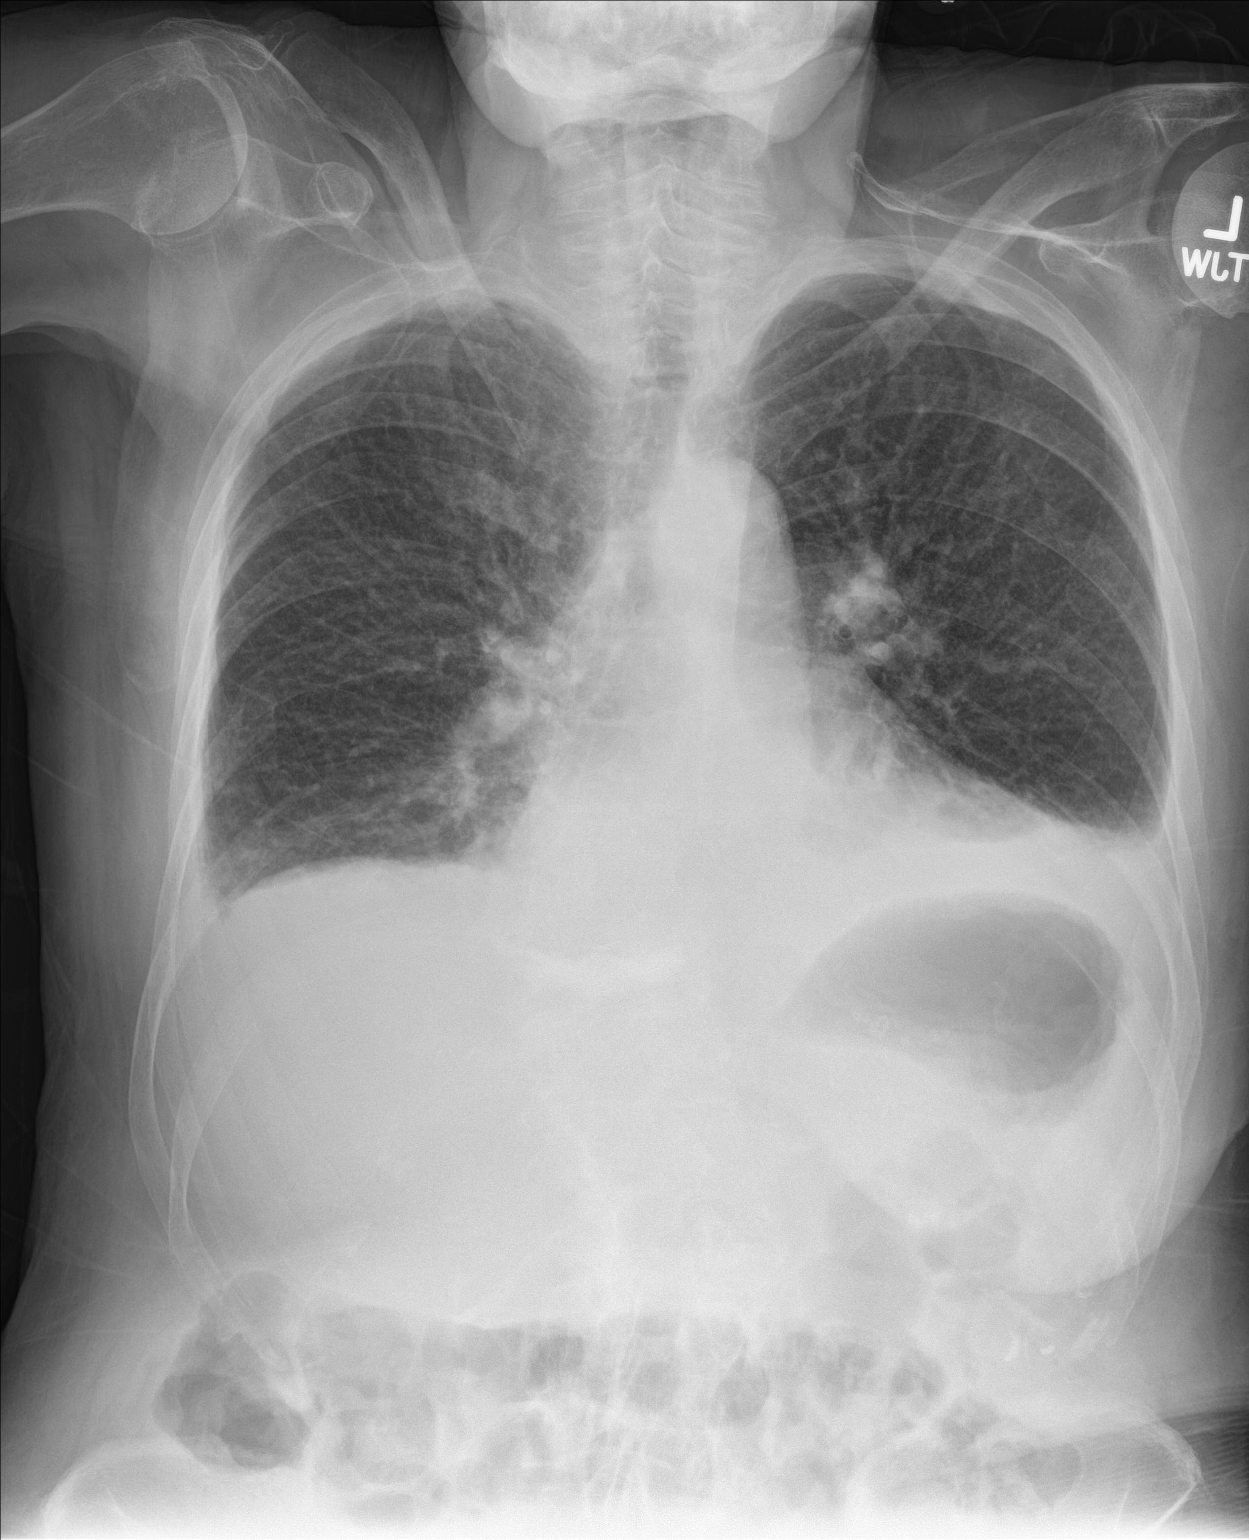

[chest lat]
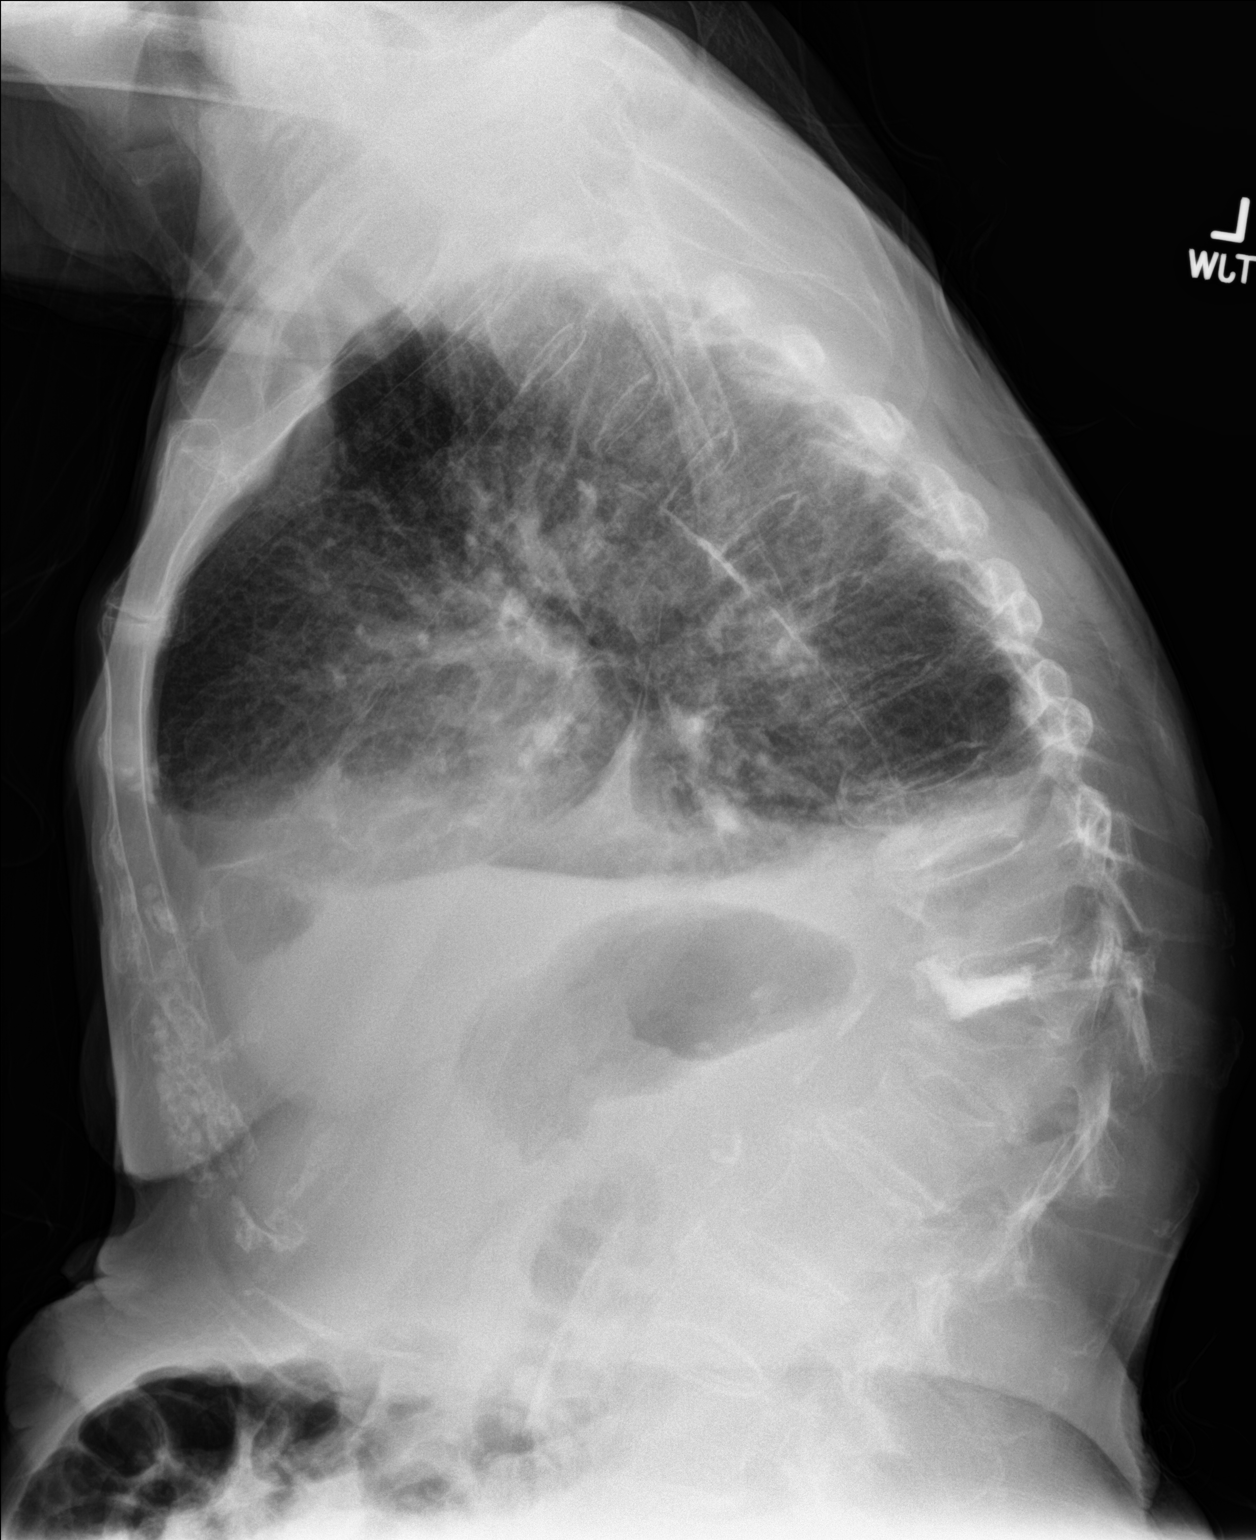

[2 of 2 positions shown; findings below may reference images not displayed]

FINDINGS: Cardiac shadow is enlarged in size but stable. Stable mild vascular
congestion is seen. Mild blunting of the costophrenic angles is
again identified similar to that seen on the prior exam. Underlying
atelectatic changes are noted as well. Compression deformities with
prior augmentation are noted in the lower thoracic spine with
increased kyphosis. No new focal abnormality is seen.
IMPRESSION: Bibasilar atelectatic changes and small effusions.

Stable mild vascular congestion.

## 2019-12-08 IMAGING — US US EXTREM LOW VENOUS*L*
1 series · 13 of 24 positions shown · non-contrast
Comparison: Bilateral lower extremity venous Doppler scan dated
11/18/2017

CLINICAL DATA: Left lower extremity swelling for several months
with recent worsening.



[Series 1: us extrem low venous*left* · 13 of 52 slices shown]
[im 1/52]
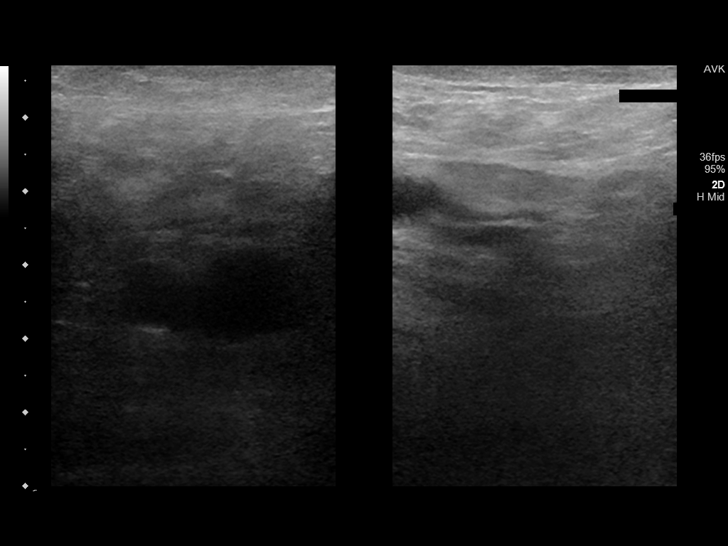
[im 5/52]
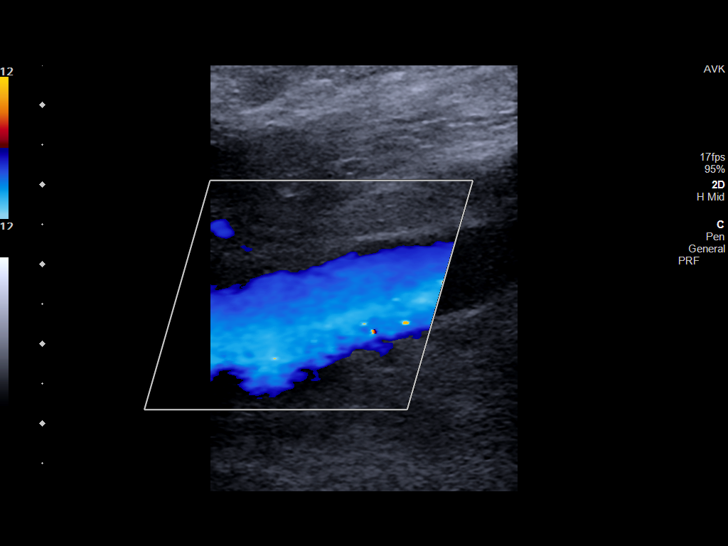
[im 9/52]
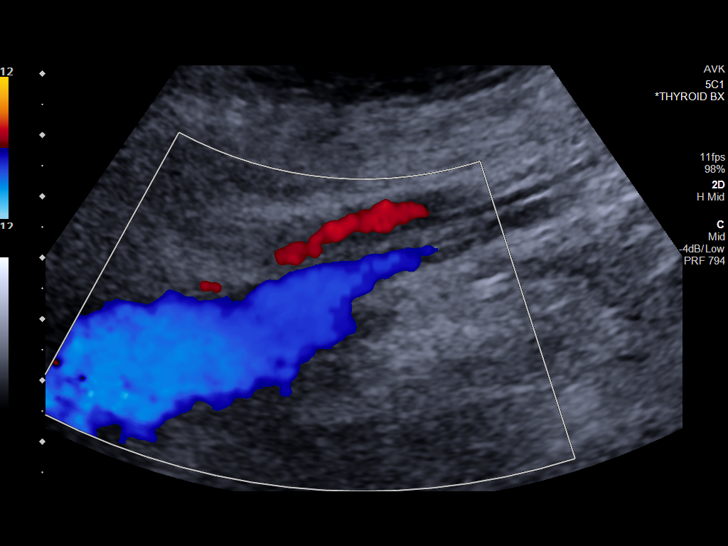
[im 14/52]
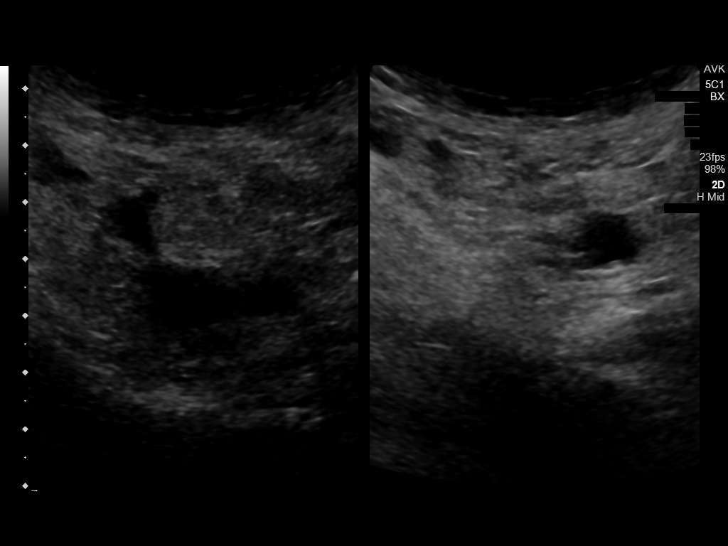
[im 18/52]
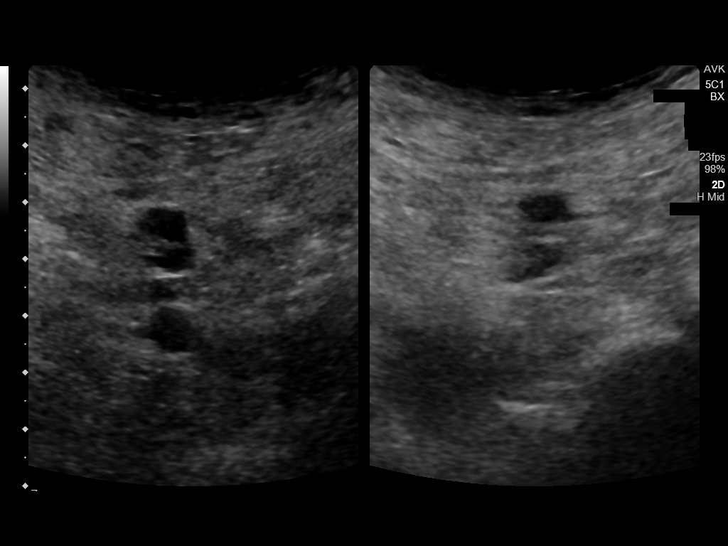
[im 23/52]
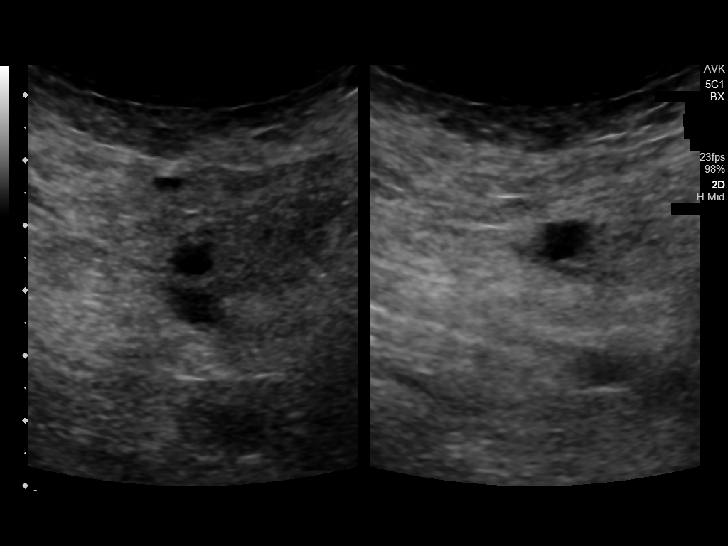
[im 27/52]
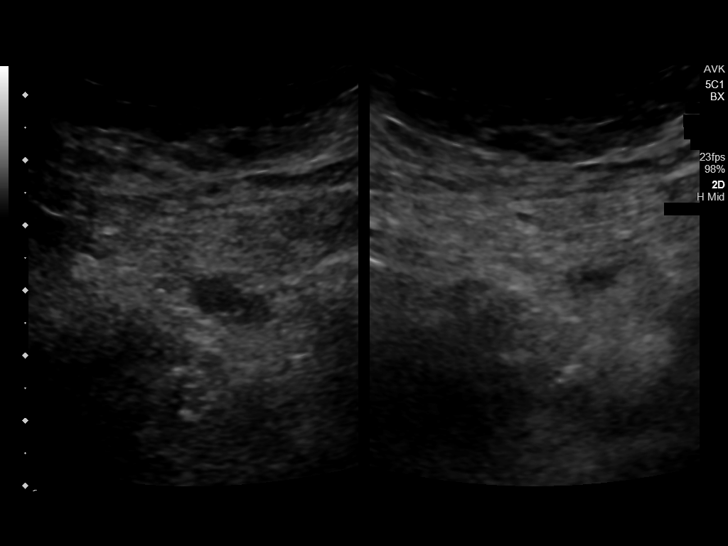
[im 29/52]
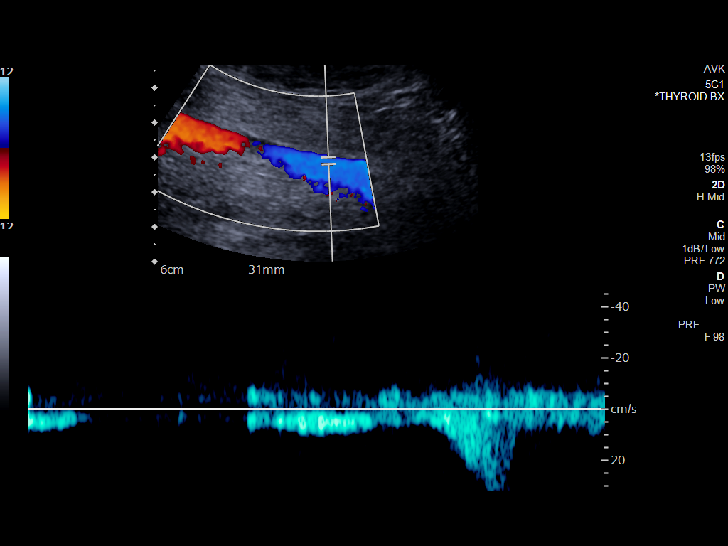
[im 34/52]
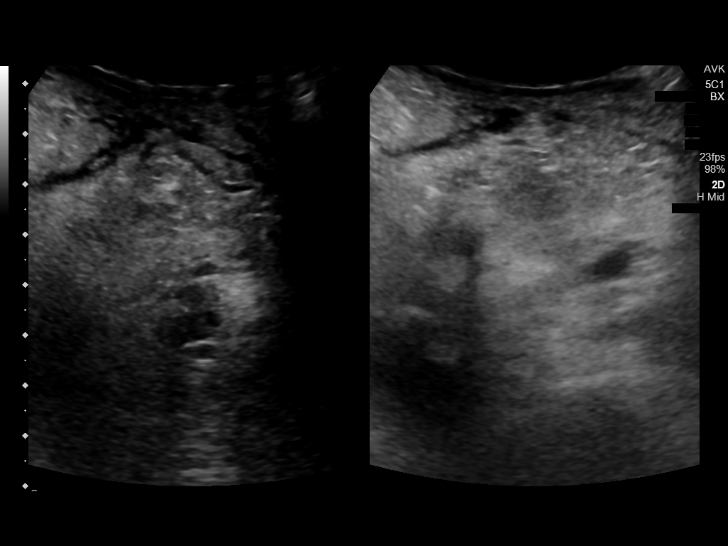
[im 38/52]
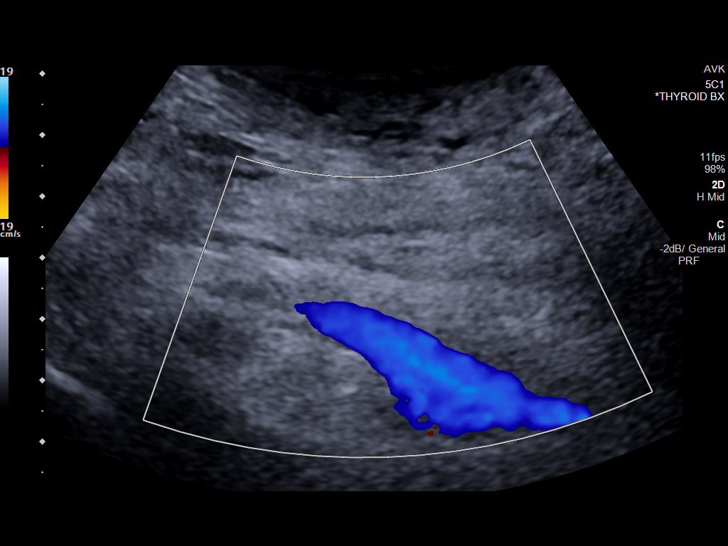
[im 43/52]
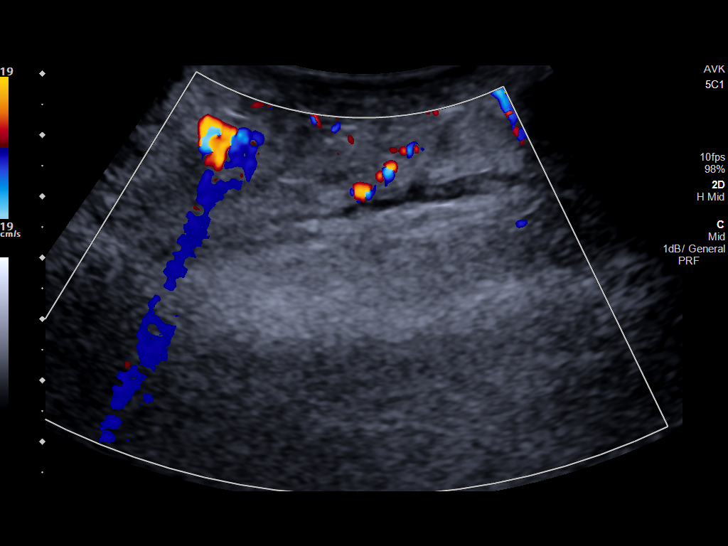
[im 47/52]
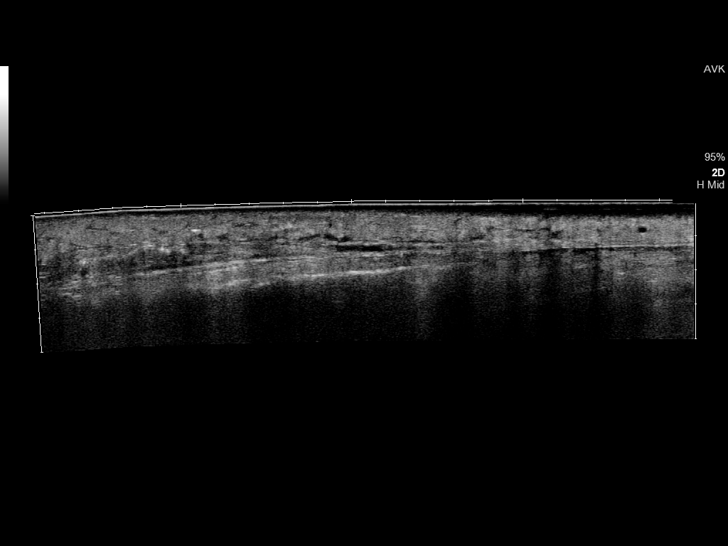
[im 52/52]
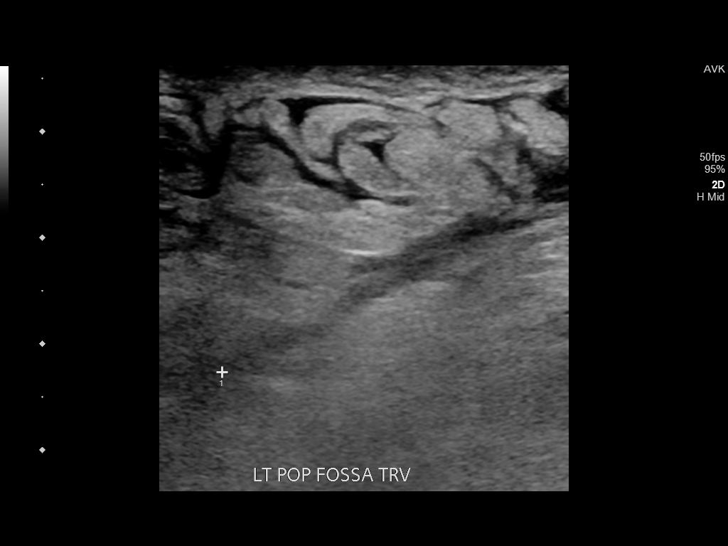

[13 of 24 positions shown; findings below may reference images not displayed]

FINDINGS: Contralateral Common Femoral Vein: Respiratory phasicity is normal
and symmetric with the symptomatic side. No evidence of thrombus.
Normal compressibility.

Common Femoral Vein: No evidence of thrombus. Normal
compressibility, respiratory phasicity and response to augmentation.

Saphenofemoral Junction: No evidence of thrombus. Normal
compressibility and flow on color Doppler imaging.

Profunda Femoral Vein: No evidence of thrombus. Normal
compressibility and flow on color Doppler imaging.

Femoral Vein: No evidence of thrombus. Normal compressibility,
respiratory phasicity and response to augmentation.

Popliteal Vein: No evidence of thrombus. Normal compressibility,
respiratory phasicity and response to augmentation.

Calf Veins: Nonvisualization of the left calf deep veins,
potentially due to prominent overlying subcutaneous edema.

Superficial Great Saphenous Vein: No evidence of thrombus. Normal
compressibility.

Venous Reflux:  None.

Other Findings:  None.
IMPRESSION: No evidence of deep venous thrombosis in the left lower extremity,
with limited evaluation of the left calf due to overlying
subcutaneous edema.

## 2019-12-09 DIAGNOSIS — H401132 Primary open-angle glaucoma, bilateral, moderate stage: Secondary | ICD-10-CM | POA: Diagnosis not present

## 2019-12-17 DIAGNOSIS — R601 Generalized edema: Secondary | ICD-10-CM | POA: Diagnosis not present

## 2019-12-17 DIAGNOSIS — M6281 Muscle weakness (generalized): Secondary | ICD-10-CM | POA: Diagnosis not present

## 2019-12-17 DIAGNOSIS — R600 Localized edema: Secondary | ICD-10-CM | POA: Diagnosis not present

## 2019-12-17 DIAGNOSIS — N3946 Mixed incontinence: Secondary | ICD-10-CM | POA: Diagnosis not present

## 2019-12-19 DIAGNOSIS — R600 Localized edema: Secondary | ICD-10-CM | POA: Diagnosis not present

## 2019-12-19 DIAGNOSIS — R601 Generalized edema: Secondary | ICD-10-CM | POA: Diagnosis not present

## 2019-12-19 DIAGNOSIS — N3946 Mixed incontinence: Secondary | ICD-10-CM | POA: Diagnosis not present

## 2019-12-19 DIAGNOSIS — M6281 Muscle weakness (generalized): Secondary | ICD-10-CM | POA: Diagnosis not present

## 2019-12-22 DIAGNOSIS — R600 Localized edema: Secondary | ICD-10-CM | POA: Diagnosis not present

## 2019-12-22 DIAGNOSIS — R601 Generalized edema: Secondary | ICD-10-CM | POA: Diagnosis not present

## 2019-12-22 DIAGNOSIS — N3946 Mixed incontinence: Secondary | ICD-10-CM | POA: Diagnosis not present

## 2019-12-22 DIAGNOSIS — M6281 Muscle weakness (generalized): Secondary | ICD-10-CM | POA: Diagnosis not present

## 2019-12-25 DIAGNOSIS — N3946 Mixed incontinence: Secondary | ICD-10-CM | POA: Diagnosis not present

## 2019-12-25 DIAGNOSIS — R600 Localized edema: Secondary | ICD-10-CM | POA: Diagnosis not present

## 2019-12-25 DIAGNOSIS — R601 Generalized edema: Secondary | ICD-10-CM | POA: Diagnosis not present

## 2019-12-25 DIAGNOSIS — M6281 Muscle weakness (generalized): Secondary | ICD-10-CM | POA: Diagnosis not present

## 2019-12-31 ENCOUNTER — Other Ambulatory Visit: Payer: Self-pay | Admitting: Cardiovascular Disease

## 2020-01-06 DIAGNOSIS — H353213 Exudative age-related macular degeneration, right eye, with inactive scar: Secondary | ICD-10-CM | POA: Diagnosis not present

## 2020-01-08 DIAGNOSIS — R601 Generalized edema: Secondary | ICD-10-CM | POA: Diagnosis not present

## 2020-01-08 DIAGNOSIS — R600 Localized edema: Secondary | ICD-10-CM | POA: Diagnosis not present

## 2020-01-08 DIAGNOSIS — N3946 Mixed incontinence: Secondary | ICD-10-CM | POA: Diagnosis not present

## 2020-01-08 DIAGNOSIS — M6281 Muscle weakness (generalized): Secondary | ICD-10-CM | POA: Diagnosis not present

## 2020-01-12 DIAGNOSIS — R69 Illness, unspecified: Secondary | ICD-10-CM | POA: Diagnosis not present

## 2020-01-14 DIAGNOSIS — M6281 Muscle weakness (generalized): Secondary | ICD-10-CM | POA: Diagnosis not present

## 2020-01-14 DIAGNOSIS — N3946 Mixed incontinence: Secondary | ICD-10-CM | POA: Diagnosis not present

## 2020-01-14 DIAGNOSIS — R600 Localized edema: Secondary | ICD-10-CM | POA: Diagnosis not present

## 2020-01-14 DIAGNOSIS — R601 Generalized edema: Secondary | ICD-10-CM | POA: Diagnosis not present

## 2020-01-15 DIAGNOSIS — R600 Localized edema: Secondary | ICD-10-CM | POA: Diagnosis not present

## 2020-01-15 DIAGNOSIS — M6281 Muscle weakness (generalized): Secondary | ICD-10-CM | POA: Diagnosis not present

## 2020-01-15 DIAGNOSIS — R601 Generalized edema: Secondary | ICD-10-CM | POA: Diagnosis not present

## 2020-01-15 DIAGNOSIS — N3946 Mixed incontinence: Secondary | ICD-10-CM | POA: Diagnosis not present

## 2020-01-20 DIAGNOSIS — R600 Localized edema: Secondary | ICD-10-CM | POA: Diagnosis not present

## 2020-01-20 DIAGNOSIS — N3946 Mixed incontinence: Secondary | ICD-10-CM | POA: Diagnosis not present

## 2020-01-20 DIAGNOSIS — R601 Generalized edema: Secondary | ICD-10-CM | POA: Diagnosis not present

## 2020-01-20 DIAGNOSIS — M6281 Muscle weakness (generalized): Secondary | ICD-10-CM | POA: Diagnosis not present

## 2020-01-29 DIAGNOSIS — R601 Generalized edema: Secondary | ICD-10-CM | POA: Diagnosis not present

## 2020-01-29 DIAGNOSIS — N3946 Mixed incontinence: Secondary | ICD-10-CM | POA: Diagnosis not present

## 2020-01-29 DIAGNOSIS — R600 Localized edema: Secondary | ICD-10-CM | POA: Diagnosis not present

## 2020-01-29 DIAGNOSIS — M6281 Muscle weakness (generalized): Secondary | ICD-10-CM | POA: Diagnosis not present

## 2020-01-30 DIAGNOSIS — R600 Localized edema: Secondary | ICD-10-CM | POA: Diagnosis not present

## 2020-01-30 DIAGNOSIS — R601 Generalized edema: Secondary | ICD-10-CM | POA: Diagnosis not present

## 2020-01-30 DIAGNOSIS — M6281 Muscle weakness (generalized): Secondary | ICD-10-CM | POA: Diagnosis not present

## 2020-01-30 DIAGNOSIS — N3946 Mixed incontinence: Secondary | ICD-10-CM | POA: Diagnosis not present

## 2020-03-15 ENCOUNTER — Ambulatory Visit: Payer: Medicare HMO | Admitting: Cardiovascular Disease

## 2020-03-22 ENCOUNTER — Telehealth: Payer: Self-pay

## 2020-03-22 NOTE — Telephone Encounter (Signed)
Unable to reach pt by phone; I tried to call Mendel Ryder and was advised [name] passed away. I was unable to reach [place] also. Sending note to Gentry Fitz NP and [facility] CMA.

## 2020-03-22 NOTE — Telephone Encounter (Signed)
[employer] Night - Client TELEPHONE ADVICE RECORD [facility] Patient Name: Heather Larson Gender: Female DOB: [birth year] Age: 86 Y 6 M 9 D Return Phone Number: [phone] (Primary) Address: City/State/Zip: [place] Client [place] Night - Client Client Site [place] Physician Alma Friendly - NP Contact Type Call Who Is Calling Patient / Member / Family / Caregiver Call Type Triage / Clinical Relationship To Patient Self Return Phone Number [phone] (Primary) Chief Complaint Sore Throat Reason for Call Symptomatic / Request for [place] says she woke up with a really bad sore throat and wants to know what she can do about it. She did have the booster shot about a week ago. Translation No Nurse Assessment Nurse: Nehemiah Massed, RN, Tammy Date/Time (Eastern Time): 03/20/2020 2:12:10 AM Confirm and document reason for call. If symptomatic, describe symptoms. ---Caller says she woke up with a really bad sore throat that started last night. She wants to know what she can do about it. She did have the covid booster shot about a week ago. Does the patient have any new or worsening symptoms? ---Yes Will a triage be completed? ---Yes Related visit to physician within the last 2 weeks? ---No Does the PT have any chronic conditions? (i.e. diabetes, asthma, this includes High risk factors for pregnancy, etc.) ---Yes List chronic conditions. ---Afib Is this a behavioral health or substance abuse call? ---No Guidelines Guideline Title Affirmed Question Affirmed Notes Nurse Date/Time (Eastern Time) Sore Throat [1] Sore throat is the only symptom AND [2] sore throat present < 48 hours Planck, RN, Tammy 03/20/2020 2:13:57 AM Disp. Time Eilene Ghazi Time) Disposition Final User 03/20/2020 2:26:31 Enola, RN, Tammy Caller Disagree/Comply Comply Caller  Understands Yes PLEASE NOTE: All timestamps contained within this report are represented as [place] Standard Time. CONFIDENTIALTY NOTICE: This fax transmission is intended only for the addressee. It contains information that is legally privileged, confidential or otherwise protected from use or disclosure. If you are not the intended recipient, you are strictly prohibited from reviewing, disclosing, copying using or disseminating any of this information or taking any action in reliance on or regarding this information. If you have received this fax in error, please notify us immediately by telephone so that we can arrange for its return to [place]. Phone: [phone], Toll-Free: [phone], Fax: [phone] Page: 2 of 2 Call Id: [id number] PreDisposition [place] Advice Given Per Guideline HOME CARE: * You should be able to treat this at home. SORE THROAT: * Sip warm chicken broth or apple juice. * Suck on hard candy or an over-the-counter throat lozenge. SOFT DIET: * Cold drinks, popsicles, and milk shakes are especially good. Avoid citrus fruits. DRINK PLENTY LIQUIDS: * Drink plenty of liquids. This is important to prevent dehydration. EXPECTED COURSE: * Sore throats with viral illnesses usually last 3 or 4 days. CALL BACK IF: * Sore throat is the only symptom AND lasts over 48 hours * Sore throat with cold symptoms, and sore throat lasts over 5 days * Fever lasts over 3 days * You become worse CARE ADVICE given per Sore Throat (Adult) guideline. Referrals REFERRED TO PCP OFFICE

## 2020-03-22 NOTE — Telephone Encounter (Signed)
Please notify patient that I am so sorry to hear about Shauna Hugh (patient's daughter), no one had told me! Can we try to get some additional information? Any other symptoms? Fevers?

## 2020-03-23 NOTE — Telephone Encounter (Signed)
Left message to return call to our office.  

## 2020-03-30 DIAGNOSIS — R69 Illness, unspecified: Secondary | ICD-10-CM | POA: Diagnosis not present

## 2020-03-31 ENCOUNTER — Other Ambulatory Visit: Payer: Self-pay | Admitting: Cardiovascular Disease

## 2020-03-31 ENCOUNTER — Other Ambulatory Visit: Payer: Self-pay | Admitting: *Deleted

## 2020-03-31 ENCOUNTER — Other Ambulatory Visit: Payer: Self-pay | Admitting: Infectious Diseases

## 2020-03-31 ENCOUNTER — Telehealth: Payer: Self-pay

## 2020-03-31 DIAGNOSIS — L03119 Cellulitis of unspecified part of limb: Secondary | ICD-10-CM

## 2020-03-31 MED ORDER — PENICILLIN V POTASSIUM 500 MG PO TABS: 500.0000 mg | ORAL_TABLET | Freq: Two times a day (BID) | ORAL | 3 refills | Status: DC

## 2020-03-31 NOTE — Telephone Encounter (Signed)
Patient left voicemail requesting Penicillin refill for cellulitis. Will route to provider.   Beryle Flock, RN

## 2020-03-31 NOTE — Telephone Encounter (Signed)
Pt's age 86, wt 54.7 kg, SCr 0.68, CrCl 0.68, last ov w/ TG 09/09/19.

## 2020-03-31 NOTE — Telephone Encounter (Signed)
Please notify her that I am happy to refill this medication for her. I just need to know if she's taking 1 tablet BID every other day, or 1 tablet once daily every other day. Thanks.

## 2020-03-31 NOTE — Telephone Encounter (Signed)
Done. Thx.

## 2020-03-31 NOTE — Telephone Encounter (Signed)
Patient called stating that she has a history of cellulitis. Patient stated that Dr. Delaine Lame started her on penicillin to prevent her from getting cellulitis and it has worked. Patient stated that she tried to get a refill on the Penicillin but Dr. Delaine Lame would not refill the Penicillin. Patient stated that she has been taking the Penicillin every other day and that has prevented her from getting cellulitis. Patient stated that she was hoping that Allie Bossier NP would refill this for her.Patient denies cellulitis at this time. [facility]

## 2020-04-01 MED ORDER — PENICILLIN V POTASSIUM 500 MG PO TABS: ORAL_TABLET | ORAL | 1 refills | Status: AC

## 2020-04-01 NOTE — Telephone Encounter (Signed)
Patient is taking one tablet every other day. Patient also wanted to let you know that her daughter passed two months ago. She has always been with her for her appointments.

## 2020-04-01 NOTE — Telephone Encounter (Signed)
Noted Rx sent to pharmacy. Appreciate the update and am very sorry to hear.

## 2020-04-05 ENCOUNTER — Other Ambulatory Visit: Payer: Self-pay | Admitting: Cardiovascular Disease

## 2020-04-19 ENCOUNTER — Other Ambulatory Visit: Payer: Self-pay

## 2020-04-20 ENCOUNTER — Ambulatory Visit: Payer: Medicare HMO | Admitting: Gastroenterology

## 2020-08-13 ENCOUNTER — Other Ambulatory Visit: Payer: Self-pay

## 2020-08-13 MED ORDER — ATORVASTATIN CALCIUM 10 MG PO TABS: 1.0000 | ORAL_TABLET | Freq: Every day | ORAL | 0 refills | Status: AC

## 2020-10-19 ENCOUNTER — Telehealth: Payer: Self-pay | Admitting: Cardiovascular Disease

## 2020-10-19 NOTE — Telephone Encounter (Signed)
-----   Message from Clarisse Gouge sent at 08/27/2020 11:48 AM EDT ----- Regarding: FW: appointment Attempted to schedule .  No ans no vm.  ----- Message ----- From: Marykay Lex Sent: 08/13/2020   9:53 AM EDT To: Rebeca Alert [facility] Scheduling Subject: FW: appointment                                Attempted to schedule, no answer and no voicemail  ----- Message ----- From: Raelene Bott, [place] Rocker Sent: 08/13/2020   9:21 AM EDT To: Rebeca Alert [facility] Scheduling Subject: appointment                                    Please schedule overdue F/U appointment for refills. Patient cancelled last schedule office visit. Thank you!

## 2020-10-19 NOTE — Telephone Encounter (Signed)
Unable to complete call invalid number vs network issue.

## 2020-11-02 NOTE — Telephone Encounter (Signed)
Attempted to schedule no ans no vm  

## 2020-11-15 ENCOUNTER — Other Ambulatory Visit: Payer: Self-pay | Admitting: Cardiovascular Disease

## 2020-11-15 NOTE — Telephone Encounter (Signed)
Please schedule overdue F/U appointment. Thank you! ?

## 2020-12-10 ENCOUNTER — Other Ambulatory Visit: Payer: Self-pay | Admitting: Cardiovascular Disease

## 2020-12-10 DIAGNOSIS — I10 Essential (primary) hypertension: Secondary | ICD-10-CM

## 2020-12-10 DIAGNOSIS — I482 Chronic atrial fibrillation, unspecified: Secondary | ICD-10-CM

## 2020-12-10 NOTE — Telephone Encounter (Signed)
Pt overdue for labs. I nee permission from a pharmd to place an order so I will route to them.  Prescription refill request for Xarelto received.  Indication:afib Last office visit:gollan 09/09/19 Weight:56.4kg Age:84fScr:0.68 11/11/19 overdue  CrCl:44.1

## 2020-12-10 NOTE — Telephone Encounter (Signed)
Refill request

## 2020-12-13 MED ORDER — RIVAROXABAN 15 MG PO TABS: 15.0000 mg | ORAL_TABLET | Freq: Every day | ORAL | 0 refills | Status: AC

## 2020-12-13 NOTE — Telephone Encounter (Signed)
I called the pharmacy to cancel the rx I accidentally sent in for 90 days and will send a one month supply and order labs as requested AND THAT WAS INDICATED ON THE BOTTLE

## 2020-12-13 NOTE — Telephone Encounter (Signed)
Prescription refill request for Xarelto received.  Indication:afib Last office visit:09/09/19 gollan  Weight:56.4kg Age:40fScr: 0.680 mg/ 11/11/2019 CrCl:44.1

## 2020-12-13 NOTE — Addendum Note (Signed)
Addended by: Allean Found on: 12/13/2020 12:17 PM   Modules accepted: Orders

## 2020-12-13 NOTE — Telephone Encounter (Signed)
Looks like she's overdue for MD follow up as well and staffing had trouble reaching her last month to schedule.  Yes please place order for annual CBC and BMET to be checked so we can confirm appropriate Xarelto dosing. Ok to send in 1 month supply if pt has run out and needs med before she's able to make it in for labs.

## 2020-12-20 ENCOUNTER — Other Ambulatory Visit: Payer: Self-pay | Admitting: Primary Care

## 2020-12-20 DIAGNOSIS — L03119 Cellulitis of unspecified part of limb: Secondary | ICD-10-CM

## 2020-12-20 NOTE — Telephone Encounter (Signed)
Patient is overdue for follow up, this will be required prior to any further refills.   Did she move out of state?   Please schedule. Needs 40 min slot. Let me know when you've scheduled.

## 2020-12-24 NOTE — Telephone Encounter (Signed)
No answer no vm

## 2020-12-27 NOTE — Telephone Encounter (Signed)
Called home number no answer no v/m. Called cell and invalid number.

## 2021-01-10 NOTE — Telephone Encounter (Signed)
Called x 3 no answer no v/m

## 2021-01-18 ENCOUNTER — Other Ambulatory Visit: Payer: Self-pay | Admitting: Cardiovascular Disease

## 2021-01-19 NOTE — Telephone Encounter (Signed)
Unable to reach.

## 2021-01-19 NOTE — Telephone Encounter (Signed)
Please contact pt for future appointment. Pt overdue for 6 month f/u. Pt needing refills. 

## 2021-01-20 NOTE — Telephone Encounter (Signed)
Pt is overdue for office visit and annual BMET and CBC - followed in [place] office. Requires updated labs to reassess which dose of Xarelto she will qualify for.

## 2021-01-20 NOTE — Telephone Encounter (Signed)
Refill request

## 2021-01-20 NOTE — Telephone Encounter (Signed)
Please see note below. 

## 2021-01-20 NOTE — Telephone Encounter (Signed)
The patient may receive a one month supply of xarelto at the current 20mg  dose but will also need labs for bmet and cbc for annual doac monitoring. And she needs and appt to be scheduled. Routing to [facility] refill pool for them to fill at a one month supply and to order and schedule lab appt and overdue md appt. This is needed asap.

## 2021-01-20 NOTE — Telephone Encounter (Signed)
ROUTING TO THE PHARMD POOL BECAUSE BASED ON OLD LABS THEY QUALIFY FOR A DOSE INCREASE.  Prescription refill request for Xarelto received.  Indication:AFIB Last office visit:GOLLAN 09/09/19 OVERDUE Weight:54.7KG Age:27F Scr:0.680 mg/ 11/11/2019 OVERDUE CrCl:50.3 BASED ON ON LABS

## 2021-01-20 NOTE — Telephone Encounter (Signed)
No answer, no VM

## 2021-01-21 NOTE — Telephone Encounter (Signed)
Pt is still not scheduled, does this medication need to be refilled or denied? Please address

## 2021-01-21 NOTE — Telephone Encounter (Signed)
Refill denied in hopes that pharmacy will let her know to make appt since our office has been unable to get in contact w/ her.

## 2021-04-20 ENCOUNTER — Other Ambulatory Visit: Payer: Self-pay | Admitting: Cardiovascular Disease

## 2021-04-20 NOTE — Telephone Encounter (Signed)
Patient needs an appt.

## 2021-04-20 NOTE — Telephone Encounter (Signed)
Attempted to schedule.  No ans no vm  

## 2021-05-20 NOTE — Telephone Encounter (Signed)
Attempted to schedule no ans no vm  

## 2021-08-12 NOTE — Telephone Encounter (Signed)
Attempted to schedule.  

## 2021-08-17 NOTE — Telephone Encounter (Signed)
3 attempts to schedule fu appt from recall list.   Deleting recall.
